# Patient Record
Sex: Female | Born: 1950 | Race: White | Hispanic: No | Marital: Married
Health system: Midwestern US, Community
[De-identification: ages and names within clinical notes are randomized; demographics above are authoritative.]

## PROBLEM LIST (undated history)

## (undated) DIAGNOSIS — M542 Cervicalgia: Secondary | ICD-10-CM

## (undated) DIAGNOSIS — Z87891 Personal history of nicotine dependence: Secondary | ICD-10-CM

## (undated) DIAGNOSIS — R269 Unspecified abnormalities of gait and mobility: Secondary | ICD-10-CM

## (undated) DIAGNOSIS — Z952 Presence of prosthetic heart valve: Secondary | ICD-10-CM

## (undated) DIAGNOSIS — E1121 Type 2 diabetes mellitus with diabetic nephropathy: Secondary | ICD-10-CM

## (undated) DIAGNOSIS — Z1231 Encounter for screening mammogram for malignant neoplasm of breast: Secondary | ICD-10-CM

## (undated) DIAGNOSIS — I2510 Atherosclerotic heart disease of native coronary artery without angina pectoris: Secondary | ICD-10-CM

## (undated) DIAGNOSIS — I152 Hypertension secondary to endocrine disorders: Secondary | ICD-10-CM

## (undated) DIAGNOSIS — S43429A Sprain of unspecified rotator cuff capsule, initial encounter: Secondary | ICD-10-CM

## (undated) DIAGNOSIS — E1159 Type 2 diabetes mellitus with other circulatory complications: Secondary | ICD-10-CM

## (undated) DIAGNOSIS — Z1239 Encounter for other screening for malignant neoplasm of breast: Secondary | ICD-10-CM

## (undated) DIAGNOSIS — G8929 Other chronic pain: Secondary | ICD-10-CM

## (undated) DIAGNOSIS — K219 Gastro-esophageal reflux disease without esophagitis: Secondary | ICD-10-CM

## (undated) DIAGNOSIS — M545 Low back pain: Secondary | ICD-10-CM

## (undated) DIAGNOSIS — Z9861 Coronary angioplasty status: Secondary | ICD-10-CM

## (undated) DIAGNOSIS — Z122 Encounter for screening for malignant neoplasm of respiratory organs: Secondary | ICD-10-CM

## (undated) DIAGNOSIS — G629 Polyneuropathy, unspecified: Secondary | ICD-10-CM

## (undated) DIAGNOSIS — M71122 Other infective bursitis, left elbow: Secondary | ICD-10-CM

## (undated) DIAGNOSIS — M25551 Pain in right hip: Secondary | ICD-10-CM

## (undated) DIAGNOSIS — M549 Dorsalgia, unspecified: Secondary | ICD-10-CM

## (undated) DIAGNOSIS — N6012 Diffuse cystic mastopathy of left breast: Secondary | ICD-10-CM

## (undated) DIAGNOSIS — R059 Cough, unspecified: Secondary | ICD-10-CM

## (undated) DIAGNOSIS — E782 Mixed hyperlipidemia: Secondary | ICD-10-CM

## (undated) DIAGNOSIS — M25552 Pain in left hip: Secondary | ICD-10-CM

## (undated) DIAGNOSIS — M4802 Spinal stenosis, cervical region: Secondary | ICD-10-CM

## (undated) DIAGNOSIS — I7410 Embolism and thrombosis of unspecified parts of aorta: Secondary | ICD-10-CM

## (undated) DIAGNOSIS — K5900 Constipation, unspecified: Secondary | ICD-10-CM

## (undated) DIAGNOSIS — B370 Candidal stomatitis: Secondary | ICD-10-CM

---

## 2009-03-21 LAB — GLUCOSE, POC: Glucose (POC): 102 mg/dL (ref 65–105)

## 2009-04-19 NOTE — Procedures (Signed)
Procedures filed by Mathews Robinsons, MD at 04/19/09 2157                Author: Mathews Robinsons, MD  Service: --  Author Type: Physician       Filed: 04/19/09 2157  Date of Service: 04/19/09 0656  Status: Addendum          Editor: Mathews Robinsons, MD (Physician)            Procedure Orders        1. TRANSCRIBED CARDIAC ANGIOGRAPHY RPT [[id number]] ordered by  at 04/19/09 0656                         <!--EPICS--> Name:       Kelsey [name], Kelsey [name] By:<BR> MR #:       [id number]                      Angio:<BR> Account #:  [id number]                    Technique:<BR> DOB:        [birth year]                     Room:<BR> <BR>                           CARDIAC CATHETERIZATION<BR> <BR> DATE OF PROCEDURE:   03/21/2009<BR> <BR> CINE#:  [id number]<BR> <BR> TECHNIQUE:  Standard approach right Judkins.<BR>  <BR> COMPLICATIONS:  None.<BR> <BR> INDICATIONS:  A 58 year old female with previous history of RCA and left<BR> circumflex stents.<BR> <BR> HEMODYNAMICS:  LV systolic pressure 170.  Aortic pressure 163/80.<BR> <BR> CONTRAST USED:  Visipaque 125 mL.<BR>  <BR> FLUOROSCOPY:  6.5 minutes.  Angio-Seal was used for hemostasis.<BR> <BR> Patient was transferred to the recovery room in stable condition with no<BR> complications.<BR> <BR> LVEDP with the onset of QRS is 25.  There is a gradient of approximately  10<BR> on the aortic valve pullback.<BR> <BR> CORONARY ANATOMY:<BR> LEFT MAIN:  The left main is large in size, long in length.  It tapers<BR> about 10% at its distal portion and trifurcates.<BR> <BR> LEFT CIRCUMFLEX:  The left circumflex is a large nondominant  vessel in its<BR> ostium.  It has about a 20% tubular stenosis.  It has a stent in its<BR> mid-portion that is widely patent.  Just past the stent, there appears to<BR> be an area of eccentric 30% stenosis.  The obtuse marginals that come off<BR> the  circumflex, one comes off right at the stent has good flow though it<BR> may be jailed; the second  is moderate in size and has widely good flow; the<BR> third is small in size.<BR> <BR> INTERMEDIATE ARTERY:  There is a small ramus or intermediate artery  that is<BR> widely patent.<BR> <BR> LEFT ANTERIOR DESCENDING:  The left anterior descending is a large vessel<BR> that gives off one large proximal diagonal at the same point that it gives<BR> off a septal.  The takeoff from the LAD past the diagonal  has a 20%<BR> stenosis.  There is an eccentric 20% plaque in the mid-LAD. The rest of the<BR> LAD is widely patent distally.  The septal appears to be patent and the<BR> large diagonal appears to be patent.<BR> <BR> RIGHT CORONARY ARTERY:  The right coronary  artery was a large vessel  in its<BR> mid-portion has a stent that is widely patent.  Distal to the stent, there<BR> appears to be an area of 10% eccentric stenosis before the bifurcation.<BR> The bifurcation of the PDA and PLB shows no lesions and those  vessels are<BR> also terminally patent.  The PDA proper has mild diffuse disease.<BR> <BR> LEFT VENTRICULOGRAM:  Left ventriculogram reveals an ejection fraction of<BR> 60% with normal wall motion in all segments.<BR> <BR> CONCLUSION:<BR>    1. Widely  patent stents in the RCA and left circumflex.<BR>    2. No significant restenosis or areas of flow-limiting stenosis.  There<BR>    are mild eccentric lesions up to 20-30% seen as described above.<BR>    3. Elevated LVEDP and systemic hypertension is  noted.<BR>    4. Normal left ventricular systolic function.<BR> <BR> <BR> <BR> E-Signed By<BR> Patsy Zaragoza K. Jayshaun Phillips, MD 04/19/2009 21:57<BR> Tulio Facundo K. Brayten Komar, MD<BR> <BR> cc:    Lake Bells, M.D.<BR>        Damya Comley K. Daveah Varone, MD<BR> <BR> <BR> <BR>  <BR> VKS/DCP; D: 04/18/2009  9:41 P; T: 04/19/2009  6:56 A; DOC# [id number]; Job#<BR> [id number]<BJ> <!--EPICE-->

## 2009-04-19 NOTE — Procedures (Addendum)
Name: Kelsey [name], Kelsey [name] By:  MR #: [id number] Angio:  Account #: [id number] Technique:  DOB: November 02, 1951 Room:     CARDIAC CATHETERIZATION    DATE OF PROCEDURE: 03/21/2009    CINE#: [id number]    TECHNIQUE: Standard approach right Judkins.    COMPLICATIONS: None.    INDICATIONS: A 58 year old female with previous history of RCA and left  circumflex stents.    HEMODYNAMICS: LV systolic pressure 170. Aortic pressure 163/80.    CONTRAST USED: Visipaque 125 mL.    FLUOROSCOPY: 6.5 minutes. Angio-Seal was used for hemostasis.    Patient was transferred to the recovery room in stable condition with no  complications.    LVEDP with the onset of QRS is 25. There is a gradient of approximately 10  on the aortic valve pullback.    CORONARY ANATOMY:  LEFT MAIN: The left main is large in size, long in length. It tapers  about 10% at its distal portion and trifurcates.    LEFT CIRCUMFLEX: The left circumflex is a large nondominant vessel in its  ostium. It has about a 20% tubular stenosis. It has a stent in its  mid-portion that is widely patent. Just past the stent, there appears to  be an area of eccentric 30% stenosis. The obtuse marginals that come off  the circumflex, one comes off right at the stent has good flow though it  may be jailed; the second is moderate in size and has widely good flow; the  third is small in size.    INTERMEDIATE ARTERY: There is a small ramus or intermediate artery that is  widely patent.    LEFT ANTERIOR DESCENDING: The left anterior descending is a large vessel  that gives off one large proximal diagonal at the same point that it gives  off a septal. The takeoff from the LAD past the diagonal has a 20%  stenosis. There is an eccentric 20% plaque in the mid-LAD. The rest of the  LAD is widely patent distally. The septal appears to be patent and the  large diagonal appears to be patent.    RIGHT CORONARY ARTERY: The right coronary artery was a large vessel in its   mid-portion has a stent that is widely patent. Distal to the stent, there  appears to be an area of 10% eccentric stenosis before the bifurcation.  The bifurcation of the PDA and PLB shows no lesions and those vessels are  also terminally patent. The PDA proper has mild diffuse disease.    LEFT VENTRICULOGRAM: Left ventriculogram reveals an ejection fraction of  60% with normal wall motion in all segments.    CONCLUSION:   1. Widely patent stents in the RCA and left circumflex.   2. No significant restenosis or areas of flow-limiting stenosis. There   are mild eccentric lesions up to 20-30% seen as described above.   3. Elevated LVEDP and systemic hypertension is noted.   4. Normal left ventricular systolic function.        E-Signed By  Malikye Reppond K. Hodge Stachnik, MD 04/19/2009 21:57  Jolene Guyett K. Gasper Lloyd, MD    cc: Lake Bells, M.D.   Gillis Boardley K. Gasper Lloyd, MD          VKS/DCP; D: 04/18/2009 9:41 P; T: 04/19/2009 6:56 A; DOC# [id number]; Job#  [id number]

## 2009-07-06 LAB — CREATININE
Creatinine: 0.9 MG/DL (ref 0.6–1.3)
GFR est AA: >60 mL/min/{1.73_m2} (ref >60)
GFR est non-AA: >60 mL/min/{1.73_m2} (ref >60)

## 2009-07-06 LAB — HEMOGLOBIN: HGB: 13.6 g/dL (ref 11.5–16.0)

## 2009-07-06 LAB — BUN: BUN: 22 MG/DL — ABNORMAL HIGH (ref 6–20)

## 2009-11-01 MED ORDER — VERAPAMIL ER 180 MG TAB
180 mg | ORAL_TABLET | Freq: Every day | ORAL | Status: DC
Start: 2009-11-01 — End: 2011-03-16

## 2009-12-13 MED ORDER — RANOLAZINE SR 500 MG 12 HR TAB
500 mg | ORAL_TABLET | Freq: Two times a day (BID) | ORAL | Status: DC
Start: 2009-12-13 — End: 2011-03-16

## 2009-12-13 NOTE — Telephone Encounter (Signed)
Per verbal order of Dr. Vipal Sabharwal

## 2010-05-30 MED ORDER — ERGOCALCIFEROL (VITAMIN D2) 50,000 UNIT CAP
1250 mcg (50,000 unit) | ORAL_CAPSULE | ORAL | Status: AC
Start: 2010-05-30 — End: 2010-06-20

## 2010-05-30 MED ORDER — LEVOFLOXACIN 500 MG TAB
500 mg | ORAL_TABLET | Freq: Every day | ORAL | Status: AC
Start: 2010-05-30 — End: 2010-06-09

## 2010-05-30 NOTE — Telephone Encounter (Signed)
I called Diane this am and lmom that if she is running fever or more sob we will call in meds -levaquin 500 daily for 7 days- she should call back with report and I can put order in at that time.

## 2010-05-30 NOTE — Telephone Encounter (Signed)
Spoke with Patient needs abx  And vit D  50,000 script also along with a work note has been off work this week

## 2010-05-30 NOTE — Telephone Encounter (Signed)
Patient called stating was seen last week. She's not doing any better. Would like to speak with Clydie Braun.

## 2010-05-30 NOTE — Telephone Encounter (Addendum)
pls call pt to tell her rx has been eerx'd and she can pick up work note.

## 2010-06-11 LAB — AMB POC URINALYSIS DIP STICK AUTO W/O MICRO
Bilirubin (UA POC): NEGATIVE
Blood (UA POC): NEGATIVE
Glucose (UA POC): NEGATIVE
Ketones (UA POC): NEGATIVE
Nitrites (UA POC): NEGATIVE
Protein (UA POC): NEGATIVE mg/dL
Specific gravity (UA POC): 1.01 (ref 1.001–1.035)
Urobilinogen (UA POC): 0.2
pH (UA POC): 5.5 (ref 4.6–8.0)

## 2010-06-11 MED ORDER — ERGOCALCIFEROL (VITAMIN D2) 50,000 UNIT CAP
1250 mcg (50,000 unit) | ORAL_CAPSULE | ORAL | Status: DC
Start: 2010-06-11 — End: 2012-07-13

## 2010-06-11 NOTE — Patient Instructions (Signed)
Avoid all meat for one week or as long as you can stand.  No beef for 2 weeks.  For lunch try vegetables and fruits.     Vitamin D  Twice a week for 4 weeks.      Continue to cut down on smoking    Referral to Dr. Celine Mans for left thumb

## 2010-06-11 NOTE — Progress Notes (Signed)
Kelsey [name] is a 59 y.o. female and presents with Chief Complaint   Patient presents with   ??? Joint Pain     still not feeling great, ck left thumb     .  Pt feelling some better, but  Today is not a very good day.  Left thumb is hurting and sticking. Back and knees hurt.  She thinks Vit D did help       Current outpatient prescriptions   Medication Sig Dispense Refill   ??? ergocalciferol (ERGOCALCIFEROL) 50,000 unit capsule Take 1 Cap by mouth two (2) days a week.  8 Cap  0   ??? ergocalciferol (ERGOCALCIFEROL) 50,000 unit capsule Take 1 Cap by mouth every Tuesday and Friday for 20 days.  6 Cap  0   ??? sucralfate (CARAFATE) 1 gram tablet Take 1 g by mouth four (4) times daily. Prn reflux       ??? nebivolol (BYSTOLIC) 5 mg tablet Take 10 mg by mouth daily.       ??? tramadol (ULTRAM) 50 mg tablet Take 50 mg by mouth every six (6) hours as needed.       ??? aspirin (ASPIRIN) 325 mg tablet Take 325 mg by mouth daily.       ??? buPROPion SR (BUDEPRION SR) 150 mg SR tablet Take 75 mg by mouth daily.       ??? omega-3 fatty acids-vitamin e (FISH OIL) 1,000 mg Cap Take 1,600 mg by mouth two (2) times a day.       ??? fluoxetine (PROZAC) 20 mg capsule Take  by mouth every morning.       ??? hydrochlorothiazide (MICROZIDE) 12.5 mg capsule Take 12.5 mg by mouth Three (3) times a week.       ??? metformin (GLUCOPHAGE) 500 mg tablet Take 1,000 mg by mouth two (2) times daily (with meals).       ??? methocarbamol (ROBAXIN) 500 mg tablet Take  by mouth as directed.       ??? NITROGLYCERIN (NITROTAB SL) 0.4 mg by SubLINGual route.       ??? clopidogrel (PLAVIX) 75 mg tablet Take  by mouth daily.       ??? ranolazine (RANEXA) 500 mg SR tablet Take 1 Tab by mouth two (2) times a day.  60 Tab  3   ??? verapamil SR (CALAN-SR) 180 mg CR tablet Take 1 Tab by mouth daily.  30 Tab  3       Allergies   Allergen Reactions   ??? Sulfa (Sulfonamide Antibiotics) Unknown (comments)   ??? Pcn (Penicillins) Swelling   ??? Demerol (Meperidine) Unknown (comments)    ??? Avalide (Irbesartan-hydrochlorothiazide) Myalgia   ??? Tricor (Fenofibrate Micronized) Myalgia       Past Medical History   Diagnosis Date   ??? Hypertension    ??? CAD (coronary artery disease)    ??? Chronic pain    ??? Arthritis    ??? COPD    ??? Diabetes        Past Surgical History   Procedure Date   ??? Hx orthopaedic      rt knee   ??? Cardiac surg procedure unlist      PTCA       Family History   Problem Relation Age of Onset   ??? Lung Disease Mother    ??? Stroke Father        History   Substance Use Topics   ??? Smoking status: Current Some Day  Smoker -- 0.3 packs/day for 45 years     Types: Cigarettes   ??? Smokeless tobacco: Never Used    Comment: passed ready   ??? Alcohol Use: No      may be 3 times a year if that            Objective:  BP 170/80   Ht 5\' 5"  (1.651 m)   Wt 225 lb (102.059 kg)   BMI 37.44 kg/m2    In NAD. A&O.  HEENT -- Pupils round.  O/P Clear.  Neck -- Supple. No JVD.mild thyromegaly  Heart -- RRR. No R/M/G.  Lungs -- coarse breeath sounds  Abdomen -- Soft. Non-tender. Non-distended. No masses. Bowel sounds present.  Extremeties -- + 1 edema        Assessment/Plan:  1. Joint pain (719.40A)  RHEUMATOID ARTHRITIS FACTOR, LUPUS (SLE) ANALYSIS, SED RATE (ESR), ergocalciferol (ERGOCALCIFEROL) 50,000 unit capsule, URIC ACID, REFERRAL TO ORTHOPEDIC SURGERY, AMB POC URINALYSIS DIP STICK AUTO W/O MICRO   2. HTN (hypertension) (401.9AF)  AMB POC URINALYSIS DIP STICK AUTO W/O MICRO   3. Urinary tract infection, site not specified (599.0)  AMB POC URINALYSIS DIP STICK AUTO W/O MICRO   4. DM w/o complication type II (250.00)  AMB POC URINALYSIS DIP STICK AUTO W/O MICRO       More than 50% of the time spent in counselling re diet and exercise        Author:  Lake Bells, MD 06/11/2010 4:45 PM

## 2010-06-12 LAB — LUPUS (SLE) ANALYSIS
Actin (Smooth Muscle) Ab: 15 Units (ref 0–19)
Anti-DNA (DS) Ab, QT: <1 IU/mL (ref 0–9)
Antinuclear Antibodies Direct: NEGATIVE
Antiparietal Cell Ab: 3.6 Units (ref 0.0–20.0)
Complement C4: 21 mg/dL Adult (ref 9–36)
Michochondrial (M2) Ab: 3.1 Units (ref 0.0–20.0)
RNP Abs: <0.2 AI (ref 0.0–0.9)
Scleroderma-70 Ab: <0.2 AI (ref 0.0–0.9)
Sjogren's Anti-SS-A: <0.2 AI (ref 0.0–0.9)
Sjogren's Anti-SS-B: <0.2 AI (ref 0.0–0.9)
Smith Abs: <0.2 AI (ref 0.0–0.9)
Striation Abs: NEGATIVE
Thyroid peroxidase Ab: 13 IU/mL (ref 0–34)

## 2010-06-12 LAB — URIC ACID: Uric acid: 7.6 mg/dL — ABNORMAL HIGH (ref 2.5–7.1)

## 2010-06-12 LAB — RHEUMATOID ARTHRITIS FACTOR: Rheumatoid factor: <7 IU/mL (ref 0.0–13.9)

## 2010-06-12 LAB — SED RATE (ESR): Sed rate (ESR): 8 mm/hr (ref 0–56)

## 2010-06-21 NOTE — Telephone Encounter (Signed)
pls call pt and tell her uric acid is sl elevated- meaning  Possible gout- you can google gout diet.  Rheumatoid factor neg, test for inflammation not sig elevated.  How are you feeling on avoidance diet??

## 2010-06-21 NOTE — Telephone Encounter (Signed)
LMOM at home with results

## 2010-10-09 LAB — AMB POC COMPLETE CBC,AUTOMATED ENTER
ABS. LYMPHS (POC): 2.3 10*3/uL (ref 1.2–3.4)
HCT (POC): 40.2 % (ref 35.0–60.0)
HGB (POC): 13.1 g/dL (ref 12.0–16.0)
LYMPHOCYTES (POC): 20 % — AB (ref 20.5–51.1)
MCH (POC): 29.4 pg (ref 27.0–31.0)
MCHC (POC): 32.6 g/dL — AB (ref 33.0–37.0)
MCV (POC): 90.3 fL (ref 80.0–99.9)
PLATELET (POC): 265 10*3/uL (ref 150–450)
RBC (POC): 4.45 M/uL (ref 4.00–6.00)
WBC (POC): 11.3 10*3/uL — AB (ref 4.5–10.5)

## 2010-10-09 MED ORDER — LEVOFLOXACIN 750 MG TAB
750 mg | ORAL_TABLET | Freq: Every day | ORAL | Status: AC
Start: 2010-10-09 — End: 2010-10-14

## 2010-10-09 MED ORDER — IPRATROPIUM-ALBUTEROL 2.5 MG-0.5 MG/3 ML NEB SOLUTION
2.5 mg-0.5 mg/3 ml | PACK | Freq: Once | RESPIRATORY_TRACT | Status: AC
Start: 2010-10-09 — End: 2010-10-09

## 2010-10-09 NOTE — Progress Notes (Signed)
Kelsey [name] is a 59 y.o. female and presents with Chief Complaint   Patient presents with   ??? Cold     Deep Cough and Fever     .started feeling badly Monday with head cold, and dropped into her chest today.She is coughing up green phlegm.  Stopped smoking a few months ago.        Current outpatient prescriptions   Medication Sig Dispense Refill   ??? levofloxacin (LEVAQUIN) 750 mg tablet Take 1 Tab by mouth daily for 5 days.  5 Tab  0   ??? albuterol-ipratropium (DUONEB) 0.5 mg-3 mg(2.5 mg base)/3 mL nebulizer solution 3 mL by Nebulization route once for 1 dose.  1 Package  3   ??? ergocalciferol (ERGOCALCIFEROL) 50,000 unit capsule Take 1 Cap by mouth two (2) days a week.  8 Cap  0   ??? sucralfate (CARAFATE) 1 gram tablet Take 1 g by mouth four (4) times daily. Prn reflux       ??? nebivolol (BYSTOLIC) 5 mg tablet Take 10 mg by mouth daily.       ??? tramadol (ULTRAM) 50 mg tablet Take 50 mg by mouth every six (6) hours as needed.       ??? aspirin (ASPIRIN) 325 mg tablet Take 325 mg by mouth daily.       ??? buPROPion SR (BUDEPRION SR) 150 mg SR tablet Take 75 mg by mouth daily.       ??? omega-3 fatty acids-vitamin e (FISH OIL) 1,000 mg Cap Take 1,600 mg by mouth two (2) times a day.       ??? fluoxetine (PROZAC) 20 mg capsule Take  by mouth every morning.       ??? hydrochlorothiazide (MICROZIDE) 12.5 mg capsule Take 12.5 mg by mouth Three (3) times a week.       ??? metformin (GLUCOPHAGE) 500 mg tablet Take 1,000 mg by mouth two (2) times daily (with meals).       ??? methocarbamol (ROBAXIN) 500 mg tablet Take  by mouth as directed.       ??? NITROGLYCERIN (NITROTAB SL) 0.4 mg by SubLINGual route.       ??? clopidogrel (PLAVIX) 75 mg tablet Take  by mouth daily.       ??? ranolazine (RANEXA) 500 mg SR tablet Take 1 Tab by mouth two (2) times a day.  60 Tab  3   ??? verapamil SR (CALAN-SR) 180 mg CR tablet Take 1 Tab by mouth daily.  30 Tab  3       Allergies   Allergen Reactions   ??? Sulfa (Sulfonamide Antibiotics) Unknown (comments)    ??? Pcn (Penicillins) Swelling   ??? Demerol (Meperidine) Unknown (comments)   ??? Avalide (Irbesartan-hydrochlorothiazide) Myalgia   ??? Tricor (Fenofibrate Micronized) Myalgia       Past Medical History   Diagnosis Date   ??? Hypertension    ??? CAD (coronary artery disease)    ??? Chronic pain    ??? Arthritis    ??? COPD    ??? Diabetes        Past Surgical History   Procedure Date   ??? Hx orthopaedic      rt knee   ??? Cardiac surg procedure unlist      PTCA       Family History   Problem Relation Age of Onset   ??? Lung Disease Mother    ??? Stroke Father        History  Substance Use Topics   ??? Smoking status: Current Some Day Smoker -- 0.3 packs/day for 45 years     Types: Cigarettes   ??? Smokeless tobacco: Never Used    Comment: passed ready   ??? Alcohol Use: No      may be 3 times a year if that            Objective:  BP 140/80   Temp 99.5 ??F (37.5 ??C)   Ht 5\' 5"  (1.651 m)   Wt 225 lb (102.059 kg)   BMI 37.44 kg/m2   SpO2 92%  wdwn 59 yo wf-In NAD. A&O.  HEENT -- Pupils round.  O/P Clear.  Neck -- Supple. No JVD.  Heart --irreg, 2/6 asm  Lungs --very poor air movement  Abdomen -- Soft. Non-tender. Non-distended. No masses. Bowel sounds present.  Extremeties -- No edema.        Assessment/Plan:  1. Cough (786.2)  XR CHEST PA AND LATERAL, AMB POC COMPLETE CBC,AUTOMATED ENTER, levofloxacin (LEVAQUIN) 750 mg tablet, albuterol-ipratropium (DUONEB) 0.5 mg-3 mg(2.5 mg base)/3 mL nebulizer solution     tussionex cough syrup -4 oz.-1      Author:  Lake Bells, MD 10/09/2010 3:47 PM

## 2010-10-09 NOTE — Telephone Encounter (Signed)
Spoke with patient she will come in today

## 2010-10-09 NOTE — Patient Instructions (Signed)
MyChart Activation    Thank you for requesting access to MyChart. Please follow the instructions below to securely access and download your online medical record. MyChart allows you to send messages to your doctor, view your test results, renew your prescriptions, schedule appointments, and more.    How Do I Sign Up?    1. In your internet browser, go to [email or web address].  2. Click on the First Time User? Click Here link in the Sign In box. You will see the New Member Sign Up page.  3. Enter your MyChart Access Code exactly as it appears below. You will not need to use this code after you???ve completed the sign-up process. If you do not sign up before the expiration date, you must request a new code.    MyChart Access Code: [email or web address]  Expires: 01/07/11 03:57 PM (This is the date your MyChart access code will expire)    4. Enter the last four digits of your Social Security Number (xxxx) and Date of Birth (mm/dd/yyyy) as indicated and click Submit. You will be taken to the next sign-up page.  5. Create a MyChart ID. This will be your MyChart login ID and cannot be changed, so think of one that is secure and easy to remember.  6. Create a MyChart password. You can change your password at any time.  7. Enter your Password Reset Question and Answer. This can be used at a later time if you forget your password.   8. Enter your e-mail address. You will receive e-mail notification when new information is available in MyChart.  9. Click Sign Up. You can now view and download portions of your medical record.  10. Click the Download Summary menu link to download a portable copy of your medical information.    Additional Information    If you have questions, please call [phone]. Remember, MyChart is NOT to be used for urgent needs. For medical emergencies, dial 911.

## 2010-10-09 NOTE — Telephone Encounter (Signed)
Kriste Basque is calling for pt but she wants you to call Kelsey [name] to get an appt for today because she is feeling awful. She is coughing and such and she said it feels like she is coughing up a lung. Also that it tastes like blood but nothing is coming up.  # [id number]

## 2010-10-14 NOTE — Telephone Encounter (Signed)
If no fever or chills, continue meds.  You will continue to cough for a couple weeks- if you need to be seen before returning I would be happy to see you tomorrow aft.

## 2010-10-14 NOTE — Telephone Encounter (Signed)
Kelsey [name] would like to speak with dr or nurse re'  I'm  Still sick ; was seen Wednesday; has pneumonia. She's congested, weak. Any recommendations? Ph # is [phone].

## 2010-10-14 NOTE — Telephone Encounter (Signed)
Spoken with patient she advised she is doing better but still little as some weak and is congested.  She is home today and tomorrow, just finish ABX, taking mucinex  and the cough med  Her Question is " does she need to do anything more or different?"

## 2010-10-14 NOTE — Telephone Encounter (Signed)
Called patient and relayed doctor's message to follow-up if not much better or fever returns

## 2010-10-16 NOTE — Telephone Encounter (Signed)
Advised patient she can come in anytime and pick up note

## 2010-10-16 NOTE — Telephone Encounter (Signed)
Pt. Is calling to get a back to work note. Call when ready. It needs to be from 10/09/2010 to 10/16/2010 and to return to work tomorrow the 10/17/2010  # [phone]

## 2010-10-17 LAB — AMB POC COMPLETE CBC,AUTOMATED ENTER
ABS. LYMPHS (POC): 2.5 10*3/uL (ref 1.2–3.4)
HCT (POC): 38.1 % (ref 35.0–60.0)
HGB (POC): 12.5 g/dL (ref 12.0–16.0)
LYMPHOCYTES (POC): 31.1 % (ref 20.5–51.1)
MCH (POC): 29.5 pg (ref 27.0–31.0)
MCHC (POC): 32.8 g/dL — AB (ref 33.0–37.0)
MCV (POC): 89.9 fL (ref 80.0–99.9)
PLATELET (POC): 254 10*3/uL (ref 150–450)
RBC (POC): 4.24 M/uL (ref 4.00–6.00)
WBC (POC): 8.2 10*3/uL (ref 4.5–10.5)

## 2010-10-17 LAB — AMB POC GLUCOSE, QUANTITATIVE, BLOOD: Glucose POC: 180 mg/dL — AB (ref 65–99)

## 2010-10-17 MED ORDER — PREDNISONE 20 MG TAB
20 mg | ORAL_TABLET | ORAL | Status: DC
Start: 2010-10-17 — End: 2010-12-26

## 2010-10-17 MED ORDER — LEVOFLOXACIN 750 MG TAB
750 mg | ORAL_TABLET | Freq: Every day | ORAL | Status: AC
Start: 2010-10-17 — End: 2010-10-27

## 2010-10-17 NOTE — Progress Notes (Signed)
Kelsey [name] is a 59 y.o. female and presents with Chief Complaint   Patient presents with   ??? Follow-up     URI     .cough is better- still brown phlegm.feels very weak, feels very sob, sweats with any activity bs have been higher= not ready to return to work.        Current outpatient prescriptions   Medication Sig Dispense Refill   ??? predniSONE (DELTASONE) 20 mg tablet Take 1 Tab by mouth. One daily for 3 days, then 1/2 daily for 3 days  10 Tab  0   ??? levofloxacin (LEVAQUIN) 750 mg tablet Take 1 Tab by mouth daily for 10 days.  10 Tab  0   ??? ergocalciferol (ERGOCALCIFEROL) 50,000 unit capsule Take 1 Cap by mouth two (2) days a week.  8 Cap  0   ??? sucralfate (CARAFATE) 1 gram tablet Take 1 g by mouth four (4) times daily. Prn reflux       ??? nebivolol (BYSTOLIC) 5 mg tablet Take 10 mg by mouth daily.       ??? tramadol (ULTRAM) 50 mg tablet Take 50 mg by mouth every six (6) hours as needed.       ??? aspirin (ASPIRIN) 325 mg tablet Take 325 mg by mouth daily.       ??? buPROPion SR (BUDEPRION SR) 150 mg SR tablet Take 75 mg by mouth daily.       ??? omega-3 fatty acids-vitamin e (FISH OIL) 1,000 mg Cap Take 1,600 mg by mouth two (2) times a day.       ??? fluoxetine (PROZAC) 20 mg capsule Take  by mouth every morning.       ??? hydrochlorothiazide (MICROZIDE) 12.5 mg capsule Take 12.5 mg by mouth Three (3) times a week.       ??? metformin (GLUCOPHAGE) 500 mg tablet Take 1,000 mg by mouth two (2) times daily (with meals).       ??? methocarbamol (ROBAXIN) 500 mg tablet Take  by mouth as directed.       ??? NITROGLYCERIN (NITROTAB SL) 0.4 mg by SubLINGual route.       ??? clopidogrel (PLAVIX) 75 mg tablet Take  by mouth daily.       ??? ranolazine (RANEXA) 500 mg SR tablet Take 1 Tab by mouth two (2) times a day.  60 Tab  3   ??? verapamil SR (CALAN-SR) 180 mg CR tablet Take 1 Tab by mouth daily.  30 Tab  3       Allergies   Allergen Reactions   ??? Sulfa (Sulfonamide Antibiotics) Unknown (comments)   ??? Pcn (Penicillins) Swelling    ??? Demerol (Meperidine) Unknown (comments)   ??? Avalide (Irbesartan-hydrochlorothiazide) Myalgia   ??? Tricor (Fenofibrate Micronized) Myalgia       Past Medical History   Diagnosis Date   ??? Hypertension    ??? CAD (coronary artery disease)    ??? Chronic pain    ??? Arthritis    ??? COPD    ??? Diabetes        Past Surgical History   Procedure Date   ??? Hx orthopaedic      rt knee   ??? Cardiac surg procedure unlist      PTCA       Family History   Problem Relation Age of Onset   ??? Lung Disease Mother    ??? Stroke Father        History  Substance Use Topics   ??? Smoking status: Current Some Day Smoker -- 0.3 packs/day for 45 years     Types: Cigarettes   ??? Smokeless tobacco: Never Used    Comment: passed ready   ??? Alcohol Use: No      may be 3 times a year if that            Objective:  BP 130/70   Pulse 81   Temp 97.4 ??F (36.3 ??C)   Ht 5\' 5"  (1.651 m)   Wt 221 lb (100.245 kg)   BMI 36.78 kg/m2   SpO2 96%  wdwn58 yo wf  In NAD. A&O.  HEENT -- Pupils round.  O/P Clear.  Neck -- Supple. No JVD.  Heart -- RRR. No R/M/G.  Lungs -- coarse rhonchi with bronchospasm  Abdomen -- Soft. Non-tender. Non-distended. No masses. Bowel sounds present.  Extremeties -- No edema.        Assessment/Plan:  1. Cough (786.2)  AMB POC COMPLETE CBC,AUTOMATED ENTER, predniSONE (DELTASONE) 20 mg tablet, levofloxacin (LEVAQUIN) 750 mg tablet   2. Other chest pain (786.59)     3. Diabetes mellitus (250.00A)  AMB POC GLUCOSE, QUANTITATIVE, BLOOD     Continue jet nebs- take levaquin for 3 more days.      Author:  Lake Bells, MD 10/17/2010 4:00 PM

## 2010-10-31 MED ORDER — TRAMADOL 50 MG TAB
50 mg | ORAL_TABLET | Freq: Four times a day (QID) | ORAL | Status: DC | PRN
Start: 2010-10-31 — End: 2010-12-26

## 2010-12-05 NOTE — Telephone Encounter (Signed)
Message copied by CADDELL-LEE, Marcos Eke on Thu Dec 05, 2010  1:08 PM  ------       Message from: [place], [place] R       Created: Thu Dec 05, 2010  8:58 AM       Contact: [phone]         Pt has a sinus infection wants to speak with nurse

## 2010-12-05 NOTE — Telephone Encounter (Signed)
Called patient and left message awaiting response

## 2010-12-17 MED ORDER — HYDROCHLOROTHIAZIDE 12.5 MG CAP
12.5 mg | ORAL_CAPSULE | ORAL | Status: DC
Start: 2010-12-17 — End: 2011-03-16

## 2010-12-20 MED ORDER — VARENICLINE 1 MG TAB
1 mg | ORAL_TABLET | ORAL | Status: DC
Start: 2010-12-20 — End: 2011-03-16

## 2010-12-26 LAB — AMB POC COMPLETE CBC,AUTOMATED ENTER
ABS. LYMPHS (POC): 2.7 10*3/uL (ref 1.2–3.4)
HCT (POC): 40.2 % (ref 35.0–60.0)
HGB (POC): 13.7 g/dL (ref 12.0–16.0)
LYMPHOCYTES (POC): 34.4 % (ref 20.5–51.1)
MCH (POC): 31.1 pg — AB (ref 27.0–31.0)
MCHC (POC): 34 g/dL (ref 33.0–37.0)
MCV (POC): 91.2 fL (ref 80.0–99.9)
PLATELET (POC): 231 10*3/uL (ref 150–450)
RBC (POC): 4.41 M/uL (ref 4.00–6.00)
WBC (POC): 8 10*3/uL (ref 4.5–10.5)

## 2010-12-26 MED ORDER — HYDROCODONE 10 MG-CHLORPHENIRAMINE 8 MG/5 ML ORAL SUSP EXTEND.REL 12HR
10-8 mg/5 mL | Freq: Two times a day (BID) | ORAL | Status: DC | PRN
Start: 2010-12-26 — End: 2012-01-20

## 2010-12-26 MED ORDER — TRAMADOL 50 MG TAB
50 mg | ORAL_TABLET | Freq: Four times a day (QID) | ORAL | Status: DC | PRN
Start: 2010-12-26 — End: 2012-07-13

## 2010-12-26 NOTE — Progress Notes (Signed)
Kelsey [name] is a 60 y.o. female and presents with Chief Complaint   Patient presents with   ??? URI     also,hip pain     .was sick over Christmas, called Dr. Doreene Eland who called in [facility] - she is better but not well.  Still feels badly. No sob. Or chest pain.  Cough prod of clear to yellow phlegm. Some wheezing.  also having left hip pain.  - has been going on for 6 months.  Better with leaning forward on grocery cart. Wants to try cymbalta since friend had so much luck with it.      Current outpatient prescriptions   Medication Sig Dispense Refill   ??? chlorpheniramine-HYDROcodone (TUSSIONEX) 8-10 mg/5 mL suspension Take 5 mL by mouth every twelve (12) hours as needed for Cough.  120 mL  1   ??? varenicline (CHANTIX CONTINUING MONTH PAK) 1 mg tablet USE AS DIRECTED  56 Tab  4   ??? hydrochlorothiazide (MICROZIDE) 12.5 mg capsule TAKE 1 CAPSULE EVERY DAY  30 Cap  9   ??? tramadol (ULTRAM) 50 mg tablet Take 1 Tab by mouth every six (6) hours as needed.  30 Tab  0   ??? ergocalciferol (ERGOCALCIFEROL) 50,000 unit capsule Take 1 Cap by mouth two (2) days a week.  8 Cap  0   ??? sucralfate (CARAFATE) 1 gram tablet Take 1 g by mouth four (4) times daily. Prn reflux       ??? nebivolol (BYSTOLIC) 5 mg tablet Take 10 mg by mouth daily.       ??? aspirin (ASPIRIN) 325 mg tablet Take 325 mg by mouth daily.       ??? omega-3 fatty acids-vitamin e (FISH OIL) 1,000 mg Cap Take 1,600 mg by mouth two (2) times a day.       ??? fluoxetine (PROZAC) 20 mg capsule Take  by mouth every morning.       ??? metformin (GLUCOPHAGE) 500 mg tablet Take 1,000 mg by mouth two (2) times daily (with meals).       ??? methocarbamol (ROBAXIN) 500 mg tablet Take  by mouth as directed.       ??? NITROGLYCERIN (NITROTAB SL) 0.4 mg by SubLINGual route.       ??? clopidogrel (PLAVIX) 75 mg tablet Take  by mouth daily.       ??? ranolazine (RANEXA) 500 mg SR tablet Take 1 Tab by mouth two (2) times a day.  60 Tab  3    ??? verapamil SR (CALAN-SR) 180 mg CR tablet Take 1 Tab by mouth daily.  30 Tab  3       Allergies   Allergen Reactions   ??? Sulfa (Sulfonamide Antibiotics) Unknown (comments)   ??? Pcn (Penicillins) Swelling   ??? Demerol (Meperidine) Unknown (comments)   ??? Avalide (Irbesartan-hydrochlorothiazide) Myalgia   ??? Tricor (Fenofibrate Micronized) Myalgia       Past Medical History   Diagnosis Date   ??? Hypertension    ??? CAD (coronary artery disease)    ??? Chronic pain    ??? Arthritis    ??? COPD    ??? Diabetes        Past Surgical History   Procedure Date   ??? Hx orthopaedic      rt knee   ??? Cardiac surg procedure unlist      PTCA       Family History   Problem Relation Age of Onset   ??? Lung  Disease Mother    ??? Stroke Father        History   Substance Use Topics   ??? Smoking status: Current Some Day Smoker -- 0.3 packs/day for 45 years     Types: Cigarettes   ??? Smokeless tobacco: Never Used    Comment: passed ready   ??? Alcohol Use: No      may be 3 times a year if that            Objective:  BP 140/90   Ht 5\' 5"  (1.651 m)   Wt 225 lb (102.059 kg)   BMI 37.44 kg/m2   SpO2 94%  wdwn 60 yo wf  In NAD. A&O.  HEENT -- Pupils round.  O/P Clear.tm- fluid  Neck -- Supple. No JVD.  Heart -- RRR. No R/M/G.  Lungs -- coarse breath sounds  Abdomen -- Soft. Non-tender. Non-distended. No masses. Bowel sounds present.  Extremeties -- No edema.     Results for orders placed in visit on 12/26/10   AMB POC COMPLETE CBC,AUTOMATED ENTER   Component Value Range   ??? WBC COUNT 8.0  4.5 - 10.5 (K/uL)   ??? RBC 4.41  4.00 - 6.00 (M/uL)   ??? HGB 13.7  12.0 - 16.0 (g/dL)   ??? HCT 40.2  35.0 - 60.0 (%)   ??? MCV 91.2  80.0 - 99.9 (fL)   ??? MCH 31.1 (*) 27.0 - 31.0 (pg)   ??? MCHC 34.0  33.0 - 37.0 (g/dL)   ??? PLATELET 231  150 - 450 (K/uL)   ??? LYMPHOCYTE % 34.4  20.5 - 51.1 (%)   ??? LYMPHOCYTE # 2.7  1.2 - 3.4 (K/uL)            Assessment/Plan:   1. Cough (786.2)  AMB POC COMPLETE CBC,AUTOMATED ENTER, chlorpheniramine-HYDROcodone (TUSSIONEX) 8-10 mg/5 mL suspension, restart jet nebs   2. DM w/o complication type II (250.00)     3. Back pain (724.5E)  XR SPINE LUMBAR 2 OR 3 VIEWS     Spine xray shows severe djd, she is given refill on tramodol  Stop prozac, start cymbalta 30 mg daily, increase to 30 mg bid and then 60 mg- given rx for cymbalta  Stop smoking, start back exercises  Author:  Lake Bells, MD 12/26/2010 3:56 PM

## 2010-12-26 NOTE — Patient Instructions (Addendum)
MyChart Activation    Thank you for requesting access to MyChart. Please follow the instructions below to securely access and download your online medical record. MyChart allows you to send messages to your doctor, view your test results, renew your prescriptions, schedule appointments, and more.    How Do I Sign Up?    1. In your internet browser, go to [email or web address].  2. Click on the First Time User? Click Here link in the Sign In box. You will see the New Member Sign Up page.  3. Enter your MyChart Access Code exactly as it appears below. You will not need to use this code after you???ve completed the sign-up process. If you do not sign up before the expiration date, you must request a new code.    MyChart Access Code: [email or web address]  Expires: 01/07/11 02:57 PM (This is the date your MyChart access code will expire)    4. Enter the last four digits of your Social Security Number (xxxx) and Date of Birth (mm/dd/yyyy) as indicated and click Submit. You will be taken to the next sign-up page.  5. Create a MyChart ID. This will be your MyChart login ID and cannot be changed, so think of one that is secure and easy to remember.  6. Create a MyChart password. You can change your password at any time.  7. Enter your Password Reset Question and Answer. This can be used at a later time if you forget your password.   8. Enter your e-mail address. You will receive e-mail notification when new information is available in MyChart.  9. Click Sign Up. You can now view and download portions of your medical record.  10. Click the Download Summary menu link to download a portable copy of your medical information.    Additional Information    If you have questions, please call [phone]. Remember, MyChart is NOT to be used for urgent needs. For medical emergencies, dial 911.      Results for orders placed in visit on 12/26/10   AMB POC COMPLETE CBC,AUTOMATED ENTER   Component Value Range    ??? WBC COUNT 8.0  4.5 - 10.5 (K/uL)   ??? RBC 4.41  4.00 - 6.00 (M/uL)   ??? HGB 13.7  12.0 - 16.0 (g/dL)   ??? HCT 40.2  35.0 - 60.0 (%)   ??? MCV 91.2  80.0 - 99.9 (fL)   ??? MCH 31.1 (*) 27.0 - 31.0 (pg)   ??? MCHC 34.0  33.0 - 37.0 (g/dL)   ??? PLATELET 231  150 - 450 (K/uL)   ??? LYMPHOCYTE % 34.4  20.5 - 51.1 (%)   ??? LYMPHOCYTE # 2.7  1.2 - 3.4 (K/uL)     \\start jet nebs 2-3 times daily  Stop prozac, try cymbalta  tussionex fo cough, call if not better

## 2011-01-16 MED ORDER — SUCRALFATE 1 GRAM TAB
1 gram | ORAL_TABLET | ORAL | Status: DC
Start: 2011-01-16 — End: 2011-04-30

## 2011-01-29 MED ORDER — PLAVIX 75 MG TABLET
75 mg | ORAL_TABLET | ORAL | Status: DC
Start: 2011-01-29 — End: 2011-03-16

## 2011-02-14 MED ORDER — FLUOXETINE 20 MG CAP
20 mg | ORAL_CAPSULE | ORAL | Status: DC
Start: 2011-02-14 — End: 2011-03-21

## 2011-02-27 MED ORDER — LIDOCAINE (PF) 20 MG/ML (2 %) IJ SOLN
20 mg/mL (2 %) | Freq: Once | INTRAMUSCULAR | Status: AC
Start: 2011-02-27 — End: 2011-02-27
  Administered 2011-02-27: 16:00:00 via SUBCUTANEOUS

## 2011-02-27 MED ORDER — METHYLPREDNISOLONE 40 MG/ML SUSP FOR INJECTION
40 mg/mL | Freq: Once | INTRAMUSCULAR | Status: AC
Start: 2011-02-27 — End: 2011-02-27
  Administered 2011-02-27: 16:00:00 via INTRAMUSCULAR

## 2011-02-27 MED ORDER — SODIUM CHLORIDE 0.9 % INJECTION
Freq: Once | INTRAMUSCULAR | Status: DC
Start: 2011-02-27 — End: 2011-02-27

## 2011-02-27 MED ORDER — IOHEXOL 180 MG/ML SOLN
180 mg iodine/mL | Freq: Once | INTRATHECAL | Status: AC
Start: 2011-02-27 — End: 2011-02-27
  Administered 2011-02-27: 16:00:00 via INTRATHECAL

## 2011-03-10 MED ORDER — NITROGLYCERIN 0.4 MG SUBLINGUAL TAB
0.4 mg | ORAL_TABLET | SUBLINGUAL | Status: DC
Start: 2011-03-10 — End: 2011-08-16

## 2011-03-13 ENCOUNTER — Inpatient Hospital Stay
Admit: 2011-03-13 | Discharge: 2011-03-16 | Disposition: A | Discharge status: Home / Self Care | Payer: BLUE CROSS/BLUE SHIELD | Source: Ambulatory Visit | Attending: Internal Medicine | Admitting: Internal Medicine

## 2011-03-13 DIAGNOSIS — I2510 Atherosclerotic heart disease of native coronary artery without angina pectoris: Secondary | ICD-10-CM

## 2011-03-13 LAB — CBC W/O DIFF
HCT: 34.4 % — ABNORMAL LOW (ref 35.0–47.0)
HGB: 11.7 g/dL (ref 11.5–16.0)
MCH: 29.3 PG (ref 26.0–34.0)
MCHC: 34 g/dL (ref 30.0–36.5)
MCV: 86 FL (ref 80.0–99.0)
PLATELET: 229 10*3/uL (ref 150–400)
RBC: 4 M/uL (ref 3.80–5.20)
RDW: 14 % (ref 11.5–14.5)
WBC: 7.9 10*3/uL (ref 3.6–11.0)

## 2011-03-13 LAB — GLUCOSE, POC: Glucose (POC): 138 mg/dL — ABNORMAL HIGH (ref 65–105)

## 2011-03-13 LAB — METABOLIC PANEL, COMPREHENSIVE
A-G Ratio: 1.2 (ref 1.1–2.2)
ALT (SGPT): 50 U/L (ref 12–78)
AST (SGOT): 31 U/L (ref 15–37)
Albumin: 3.4 g/dL — ABNORMAL LOW (ref 3.5–5.0)
Alk. phosphatase: 53 U/L (ref 50–136)
Anion gap: 9 mmol/L (ref 5–15)
BUN/Creatinine ratio: 23 — ABNORMAL HIGH (ref 12–20)
BUN: 18 MG/DL (ref 6–20)
Bilirubin, total: 0.3 MG/DL (ref 0.2–1.0)
CO2: 27 MMOL/L (ref 21–32)
Calcium: 8.5 MG/DL (ref 8.5–10.1)
Chloride: 98 MMOL/L (ref 97–108)
Creatinine: 0.8 MG/DL (ref 0.6–1.3)
GFR est AA: >60 mL/min/{1.73_m2} (ref >60)
GFR est non-AA: >60 mL/min/{1.73_m2} (ref >60)
Globulin: 2.9 g/dL (ref 2.0–4.0)
Glucose: 137 MG/DL — ABNORMAL HIGH (ref 65–100)
Potassium: 3.7 MMOL/L (ref 3.5–5.1)
Protein, total: 6.3 g/dL — ABNORMAL LOW (ref 6.4–8.2)
Sodium: 134 MMOL/L — ABNORMAL LOW (ref 136–145)

## 2011-03-13 LAB — AMB POC COMPLETE CBC,AUTOMATED ENTER
ABS. LYMPHS (POC): 2.7 10*3/uL (ref 1.2–3.4)
HCT (POC): 39.5 % (ref 35.0–60.0)
HGB (POC): 13.1 g/dL (ref 12.0–16.0)
LYMPHOCYTES (POC): 31.3 % (ref 20.5–51.1)
MCH (POC): 30.3 pg (ref 27.0–31.0)
MCHC (POC): 33.2 g/dL (ref 33.0–37.0)
MCV (POC): 91.1 fL (ref 80.0–99.9)
PLATELET (POC): 250 10*3/uL (ref 150–450)
RBC (POC): 4.33 M/uL (ref 4.00–6.00)
WBC (POC): 8.5 10*3/uL (ref 4.5–10.5)

## 2011-03-13 LAB — TROPONIN I: Troponin-I, Qt.: <0.04 ng/mL (ref <0.05)

## 2011-03-13 MED ORDER — SODIUM CHLORIDE 0.9% BOLUS IV
0.9 % | Freq: Once | INTRAVENOUS | Status: AC
Start: 2011-03-13 — End: 2011-03-13
  Administered 2011-03-13: 23:00:00 via INTRAVENOUS

## 2011-03-13 MED ORDER — IOVERSOL 350 MG/ML IV SOLN
350 mg iodine/mL | Freq: Once | INTRAVENOUS | Status: AC
Start: 2011-03-13 — End: 2011-03-13
  Administered 2011-03-13: 23:00:00 via INTRAVENOUS

## 2011-03-13 MED ORDER — SODIUM CHLORIDE 0.9 % IJ SYRG
Freq: Once | INTRAMUSCULAR | Status: AC
Start: 2011-03-13 — End: 2011-03-13
  Administered 2011-03-13: 23:00:00 via INTRAVENOUS

## 2011-03-13 MED ADMIN — enoxaparin (LOVENOX) injection 40 mg: SUBCUTANEOUS | @ 17:00:00 | NDC 00075062040

## 2011-03-13 MED ADMIN — docusate sodium (COLACE) capsule 100 mg: ORAL | @ 21:00:00 | NDC 62584068311

## 2011-03-13 MED ADMIN — metFORMIN ER (GLUCOPHAGE XR) tablet 1,000 mg: ORAL | @ 21:00:00 | NDC 50268053115

## 2011-03-13 NOTE — Consults (Signed)
Name:       Kelsey [name], Kelsey [name]            Admitted:          03/13/2011                                         DOB:               22-May-1951  Account #:  [id number]               Age:               60  Consultant: Lutricia Horsfall, MD            Location           [phone]                                CONSULTATION REPORT    DATE OF CONSULTATION      HISTORY OF PRESENT ILLNESS: The patient is a 60 year old woman with the  following medical problems:  1. Coronary artery disease.  2. Presentation with acute onset dyspnea.  3. Hypertension.  4. Diabetes mellitus.  5. Continued tobacco abuse.  6. Hyperlipidemia.    Thank you very much for the opportunity to see this patient.    The patient was seen in Dr. Nelta Numbers office after she had one full day of  dyspnea on exertion. Yesterday, she noted that when she walked from the  parking lot to her office, she was dyspneic on exertion without any chest  pain or chest discomfort. Typically, with her known history of coronary  artery disease, she gets jaw pain. This was absent with this, although she  does have associated shortness of breath. She felt very weak the rest of  the day and then went to bed. While getting up this morning, taking a  shower and getting ready for work, she was very out of breath, felt very  weak, but without any chest pain or chest congestion noted. No  palpitations, although she felt her chest was beating heavier and harder.  She has no presyncope or syncope. No history of atrial arrhythmias.    She does continue to smoke and has a history of underlying coronary disease  with her last cardiac catheterization in May of 2010, demonstrating LVEDP  of 25. The circumflex is at 20% tubular stenosis with a stent, which was  widely patent and had 30% eccentric stenosis beyond the stent. The LAD had  evidence of 20% eccentric lesion in the mid LAD and in the right coronary  artery, was a large vessel that had a patent stent. She had normal LV   systolic function and at that time, had no significant flow or ________  disease.    ALLERGIES  Include:  1. AVALIDE.  2. HYDROCHLOROTHIAZIDE.  3. DEMEROL.  4. PENICILLIN.  5. SULFA.  6. TRICOR.    CURRENT MEDICATIONS  Include:  1. Plavix 75 mg a day.  2. Nitroglycerin.  3. Tramadol suspension.  4. Chantix.  5. Hydrochlorothiazide 12.5 daily.  6. Nebivolol (Bystolic) 10 mg daily.  7. Omega 3 fatty acid.  8. Metformin 1 gram b.i.d.  9. Ranexa 500 mg once daily.  10. Verapamil 180 mg once daily.    FAMILY HISTORY: Notable for  lung disease in her mother and stroke in her  father.    SOCIAL HISTORY: The patient works at the [employer]. 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    REVIEW OF SYSTEMS: Notable for no hemoptysis, hematemesis, fevers, chills,  cough, sputum production, unilateral weakness, palpitations, chest pain,  nausea, vomiting, diarrhea, hematemesis or epistaxis, hematochezia or  vaginal bleeding.    PHYSICAL EXAMINATION  GENERAL: The patient appears in good health.  VITAL SIGNS: Blood pressure is 110/56, pulse 74 and regular. She is  afebrile.  SKIN: Warm and dry.  HEENT: Sclerae anicteric. Conjunctivae are pink. Oropharynx without  exudates. Mallampati 3.  NECK: Thyroid is not enlarged.  CARDIAC: PMI is nondisplaced. Normal S1 and S2 with a 3/6 systolic murmur.  S2 is very diminished, especially in the aortic component.  ABDOMEN: Soft, nontender, nondistended. No hepatosplenomegaly. NEUROLOGIC:  Normal sensation. Pulses are 2+ throughout.  EXTREMITIES: No edema.    Electrocardiogram demonstrates sinus rhythm with nonspecific  intraventricular conduction delay.    LABORATORY DATA: Pertinent laboratory includes a normal CBC, hematocrit,  hemoglobin and BMP.    IMPRESSION  1. Acute onset of dyspnea. The patient, on exam, does have evidence of a  murmur, which may be aortic stenosis. At this time, I would recommend  further evaluation with a 2D echocardiogram. In addition, another   possibility would be pulmonary emboli and we will get a CT angio because of  that.  2. History of diabetes on Metformin. She will need to hold the Metformin  for 48 hours.    If these 2 tests are unrevealing, I would suggest a cardiac  catheterization. We will defer this at the present time, but if there is no  other etiology identified, I think that this will be the next appropriate  test, despite the fact that she did not have true chest pain, but just  dyspnea. She is advised to discontinue smoking.              Lutricia Horsfall, MD    cc:   Lutricia Horsfall, MD        Lake Bells, M.D.      JO/wmx; D: 03/13/2011  6:07 P; T: 03/13/2011  6:35 P; DOC# [id number]; Job#  [id number]

## 2011-03-13 NOTE — H&P (Signed)
History and Physical    Subjective:     Kelsey [name] is a 60 y.o. Caucasian female who presents with sob/  She noticed is yesterday am,  Has had no cough, fever,chills or chest pain.. This am while getting ready for work . Pox in office 95 % ra, peak flow 280. Chest xray was fine, ekg showed poss lat ischemia.She is admitted to r/o MI    Past Medical History   Diagnosis Date   ??? Hypertension    ??? CAD (coronary artery disease)    ??? Chronic pain    ??? Arthritis    ??? COPD    ??? Diabetes      Allergies   Allergen Reactions   ??? Avalide (Irbesartan-Hydrochlorothiazide) Myalgia   ??? Demerol (Meperidine) Unknown (comments)   ??? Pcn (Penicillins) Swelling   ??? Sulfa(Sulfonamide Antibiotics) Unknown (comments)   ??? Tricor (Fenofibrate Micronized) Myalgia     Prior to Admission medications    Medication Sig Start Date End Date Taking? Authorizing Provider   nitroglycerin (NITROSTAT) 0.4 mg SL tablet TAKE 1 TABLET BY MOUTH UNDER THE TONGUE EVERY 5 MINUTES AS NEEDED FOR CHEST PAIN AS DIRECTED 03/08/11   Lake Bells, MD   FLUoxetine (PROZAC) 20 mg capsule TAKE 1 CAPSULE EVERY DAY 02/14/11   Lake Bells, MD   PLAVIX 75 mg tablet TAKE 1 TABLET BY MOUTH EVERY DAY 01/29/11   Lake Bells, MD   sucralfate (CARAFATE) 1 gram tablet TAKE 1 TABLET 4 TIMES A DAY BEFORE MEALS AND AT BEDTIME 01/16/11   Lake Bells, MD   chlorpheniramine-HYDROcodone (TUSSIONEX) 8-10 mg/5 mL suspension Take 5 mL by mouth every twelve (12) hours as needed for Cough. 12/26/10   Lake Bells, MD   traMADol (ULTRAM) 50 mg tablet Take 1 Tab by mouth every six (6) hours as needed. 12/26/10   Lake Bells, MD   varenicline [facility] CONTINUING MONTH PAK) 1 mg tablet USE AS DIRECTED 12/19/10   Lake Bells, MD   hydrochlorothiazide (MICROZIDE) 12.5 mg capsule TAKE 1 CAPSULE EVERY DAY 12/16/10   Lake Bells, MD   ergocalciferol (ERGOCALCIFEROL) 50,000 unit capsule Take 1 Cap by mouth two (2) days a week. 06/13/10   Lake Bells, MD    nebivolol (BYSTOLIC) 5 mg tablet Take 10 mg by mouth daily.    Historical Provider   aspirin (ASPIRIN) 325 mg tablet Take 325 mg by mouth daily.    Historical Provider   omega-3 fatty acids-vitamin e (FISH OIL) 1,000 mg Cap Take 1,600 mg by mouth two (2) times a day. 12/24/09   Historical Provider   metformin (GLUCOPHAGE) 500 mg tablet Take 1,000 mg by mouth two (2) times daily (with meals).    Historical Provider   methocarbamol (ROBAXIN) 500 mg tablet Take  by mouth as directed. 12/24/09   Historical Provider   NITROGLYCERIN (NITROTAB SL) 0.4 mg by SubLINGual route.    Historical Provider   ranolazine (RANEXA) 500 mg SR tablet Take 1 Tab by mouth two (2) times a day. 12/13/09   Vipal Sabharwal, MD   verapamil SR (CALAN-SR) 180 mg CR tablet Take 1 Tab by mouth daily. 10/29/09   Mathews Robinsons, MD     History   Substance Use Topics   ??? Smoking status: Current Some Day Smoker -- 0.3 packs/day for 45 years     Types: Cigarettes   ??? Smokeless tobacco: Never Used    Comment: passed ready   ???  Alcohol Use: No      may be 3 times a year if that     Family History   Problem Relation Age of Onset   ??? Lung Disease Mother    ??? Stroke Father                Review of Systems:  As above.    Objective:       Physical Exam:   BP 110/56   Pulse 74   Temp 97.8 ??F (36.6 ??C)   Resp 18   Ht 5\' 5"  (1.651 m)   Wt 218 lb (98.884 kg)   BMI 36.28 kg/m2   SpO2 94%  General appearance: alert, cooperative, no distress, appears stated age  Throat: Lips, mucosa, and tongue normal. Teeth and gums normal  Neck: supple, symmetrical, trachea midline, no adenopathy, thyroid: not enlarged, symmetric, no tenderness/mass/nodules, no carotid bruit and no JVD  Lungs: coarse breath sounds  Breasts: normal appearance, no masses or tenderness  Heart: regular rate and rhythm, S1, S2 normal, no murmur, click, rub or gallop  Abdomen: soft, non-tender. Bowel sounds normal. No masses,  no organomegaly   Extremities: extremities normal, atraumatic, no cyanosis  edema +1  Pulses +1 symm      Data Review:   Recent Results (from the past 24 hour(s))   AMB POC COMPLETE CBC,AUTOMATED ENTER       Component Value Range    WBC COUNT 8.5  4.5 - 10.5 (K/uL)    RBC 4.33  4.00 - 6.00 (M/uL)    HGB 13.1  12.0 - 16.0 (g/dL)    HCT 45.4  09.8 - 11.9 (%)    MCV 91.1  80.0 - 99.9 (fL)    MCH 30.3  27.0 - 31.0 (pg)    MCHC 33.2  33.0 - 37.0 (g/dL)    PLATELET 147  829 - 450 (K/uL)    LYMPHOCYTE % 31.3  20.5 - 51.1 (%)    LYMPHOCYTE # 2.7  1.2 - 3.4 (K/uL)       Chest x-ray- no acute change    EKG- lat ischemia  Assessment:     Active Problems:   Dyspnea (03/13/2011)      Plan:     R/o MI, cards consult  Oxygen, duonebs  Gi and dvt prophylaxis    Signed By: Lake Bells, MD     March 13, 2011

## 2011-03-13 NOTE — Progress Notes (Signed)
Kelsey [name] is a 60 y.o. female and presents with   Chief Complaint   Patient presents with   ??? Shortness of Breath     Walk -in hard time getting a deep breath   .started yest am with sob/doe. No cough or fever.  No nausea or vomiting, no chest pain,  No swelling in feet. No jaw pain.  Legs feel rubbery when she walks across the room. Broke out in sweat this am. Had steroid shot in back 2 weeks ago        Current Outpatient Prescriptions   Medication Sig Dispense Refill   ??? nitroglycerin (NITROSTAT) 0.4 mg SL tablet TAKE 1 TABLET BY MOUTH UNDER THE TONGUE EVERY 5 MINUTES AS NEEDED FOR CHEST PAIN AS DIRECTED  50 Tab  1   ??? FLUoxetine (PROZAC) 20 mg capsule TAKE 1 CAPSULE EVERY DAY  90 Cap  1   ??? PLAVIX 75 mg tablet TAKE 1 TABLET BY MOUTH EVERY DAY  30 Tab  2   ??? sucralfate (CARAFATE) 1 gram tablet TAKE 1 TABLET 4 TIMES A DAY BEFORE MEALS AND AT BEDTIME  240 Tab  2   ??? chlorpheniramine-HYDROcodone (TUSSIONEX) 8-10 mg/5 mL suspension Take 5 mL by mouth every twelve (12) hours as needed for Cough.  120 mL  1   ??? traMADol (ULTRAM) 50 mg tablet Take 1 Tab by mouth every six (6) hours as needed.  30 Tab  1   ??? varenicline (CHANTIX CONTINUING MONTH PAK) 1 mg tablet USE AS DIRECTED  56 Tab  4   ??? hydrochlorothiazide (MICROZIDE) 12.5 mg capsule TAKE 1 CAPSULE EVERY DAY  30 Cap  9   ??? ergocalciferol (ERGOCALCIFEROL) 50,000 unit capsule Take 1 Cap by mouth two (2) days a week.  8 Cap  0   ??? nebivolol (BYSTOLIC) 5 mg tablet Take 10 mg by mouth daily.       ??? aspirin (ASPIRIN) 325 mg tablet Take 325 mg by mouth daily.       ??? omega-3 fatty acids-vitamin e (FISH OIL) 1,000 mg Cap Take 1,600 mg by mouth two (2) times a day.       ??? metformin (GLUCOPHAGE) 500 mg tablet Take 1,000 mg by mouth two (2) times daily (with meals).       ??? methocarbamol (ROBAXIN) 500 mg tablet Take  by mouth as directed.       ??? NITROGLYCERIN (NITROTAB SL) 0.4 mg by SubLINGual route.        ??? ranolazine (RANEXA) 500 mg SR tablet Take 1 Tab by mouth two (2) times a day.  60 Tab  3   ??? verapamil SR (CALAN-SR) 180 mg CR tablet Take 1 Tab by mouth daily.  30 Tab  3     Allergies   Allergen Reactions   ??? Avalide (Irbesartan-Hydrochlorothiazide) Myalgia   ??? Demerol (Meperidine) Unknown (comments)   ??? Pcn (Penicillins) Swelling   ??? Sulfa(Sulfonamide Antibiotics) Unknown (comments)   ??? Tricor (Fenofibrate Micronized) Myalgia     Past Medical History   Diagnosis Date   ??? Hypertension    ??? CAD (coronary artery disease)    ??? Chronic pain    ??? Arthritis    ??? COPD    ??? Diabetes      Past Surgical History   Procedure Date   ??? Hx orthopaedic      rt knee   ??? Cardiac surg procedure unlist      PTCA  Family History   Problem Relation Age of Onset   ??? Lung Disease Mother    ??? Stroke Father      History   Substance Use Topics   ??? Smoking status: Current Some Day Smoker -- 0.3 packs/day for 45 years     Types: Cigarettes   ??? Smokeless tobacco: Never Used    Comment: passed ready   ??? Alcohol Use: No      may be 3 times a year if that          Objective:  BP 130/70   Ht 5\' 5"  (1.651 m)   Wt 225 lb (102.059 kg)   BMI 37.44 kg/m2   SpO2 95%   PF 280 L/min  Heavy 60 yo wf-  In NAD. A&O.  HEENT -- Pupils round.  O/P Clear.  Neck -- Supple. No JVD.  Heart -- RRR. No R/M/G.  Lungs -- Coarse breath sounds  Abdomen -- Soft. Non-tender. Non-distended. No masses. Bowel sounds present.  Extremities -- No edema.        Assessment/Plan:  1. SOB (shortness of breath)  XR CHEST PA AND LATERAL, AMB POC COMPLETE CBC,AUTOMATED ENTER, AMB POC EKG ROUTINE W/ 12 LEADS, INTER & REP     Go to hospital- admit for r/o MI      Author:  Lake Bells, MD 03/13/2011 9:12 AM

## 2011-03-13 NOTE — Consults (Signed)
CARDIOLOGY CONSULT                 Assessment:     Assessment:     Cardiac risk factors: diabetes mellitus, hypertension.  Cath 5/10:  1. Widely patent stents in the RCA and left circumflex.  2. No significant restenosis or areas of flow-limiting stenosis. There  are mild eccentric lesions up to 20-30% seen as described above.  3. Elevated LVEDP and systemic hypertension is noted.  4. Normal left ventricular systolic function.        Rec: cta and echo.  May need cath if these results are negative.

## 2011-03-13 NOTE — Patient Instructions (Signed)
Go to hospital

## 2011-03-13 NOTE — Progress Notes (Signed)
Bedside and Verbal shift change report given to Erin (oncoming nurse) by Bilbo Carcamo (offgoing nurse).  Report given with SBAR, Kardex, Procedure Summary, Intake/Output, MAR and Recent Results.

## 2011-03-14 LAB — GLUCOSE, POC: Glucose (POC): 142 mg/dL — ABNORMAL HIGH (ref 65–105)

## 2011-03-14 LAB — TROPONIN I
Troponin-I, Qt.: <0.04 ng/mL (ref <0.05)
Troponin-I, Qt.: <0.04 ng/mL (ref <0.05)

## 2011-03-14 LAB — EKG, 12 LEAD, INITIAL
Atrial Rate: 75 {beats}/min
Calculated P Axis: 77 degrees
Calculated R Axis: 8 degrees
Calculated T Axis: -81 degrees
Diagnosis: NORMAL
P-R Interval: 178 ms
Q-T Interval: 410 ms
QRS Duration: 118 ms
QTC Calculation (Bezet): 457 ms
Ventricular Rate: 75 {beats}/min

## 2011-03-14 MED ORDER — IOVERSOL 350 MG/ML IV SOLN
350 mg iodine/mL | Freq: Once | INTRAVENOUS | Status: DC | PRN
Start: 2011-03-14 — End: 2011-03-14
  Administered 2011-03-14: 21:00:00 via INTRA_ARTERIAL

## 2011-03-14 MED ORDER — MIDAZOLAM 1 MG/ML IJ SOLN
1 mg/mL | INTRAMUSCULAR | Status: DC | PRN
Start: 2011-03-14 — End: 2011-03-14
  Administered 2011-03-14 (×2): via INTRAVENOUS

## 2011-03-14 MED ORDER — NITROGLYCERIN 0.1 MG/ML (100 MCG/ML) D5W COMPOUNDED INJECTION
0.1 mg/mL | INTRAVENOUS | Status: DC | PRN
Start: 2011-03-14 — End: 2011-03-14
  Administered 2011-03-14: 22:00:00 via INTRACORONARY

## 2011-03-14 MED ORDER — ATROPINE 0.1 MG/ML SYRINGE
0.1 mg/mL | INTRAMUSCULAR | Status: DC | PRN
Start: 2011-03-14 — End: 2011-03-14

## 2011-03-14 MED ORDER — SALINE PERIPHERAL FLUSH PRN
INTRAMUSCULAR | Status: DC | PRN
Start: 2011-03-14 — End: 2011-03-14
  Administered 2011-03-14: 22:00:00

## 2011-03-14 MED ORDER — LIDOCAINE (PF) 20 MG/ML (2 %) IV SYRINGE
100 mg/5 mL (2 %) | Freq: Once | INTRAVENOUS | Status: DC | PRN
Start: 2011-03-14 — End: 2011-03-14

## 2011-03-14 MED ORDER — HEPARIN (PORCINE) 1,000 UNIT/ML IJ SOLN
1000 unit/mL | Freq: Once | INTRAMUSCULAR | Status: DC | PRN
Start: 2011-03-14 — End: 2011-03-14

## 2011-03-14 MED ORDER — SODIUM CHLORIDE 0.9 % IV PIGGY BACK
250 mg | INTRAVENOUS | Status: DC
Start: 2011-03-14 — End: 2011-03-14
  Administered 2011-03-14: 22:00:00 via INTRAVENOUS

## 2011-03-14 MED ORDER — FENTANYL CITRATE (PF) 50 MCG/ML IJ SOLN
50 mcg/mL | INTRAMUSCULAR | Status: DC | PRN
Start: 2011-03-14 — End: 2011-03-14
  Administered 2011-03-14 (×2): via INTRAVENOUS

## 2011-03-14 MED ORDER — CLOPIDOGREL 300 MG TAB
300 mg | Freq: Once | ORAL | Status: DC | PRN
Start: 2011-03-14 — End: 2011-03-14

## 2011-03-14 MED ORDER — HEPARIN (PORCINE) IN NS (PF) 1,000 UNIT/500 ML IV
1000 unit/500 mL | INTRAVENOUS | Status: DC | PRN
Start: 2011-03-14 — End: 2011-03-14

## 2011-03-14 MED ORDER — LIDOCAINE HCL 1 % (10 MG/ML) IJ SOLN
10 mg/mL (1 %) | Freq: Once | INTRAMUSCULAR | Status: DC | PRN
Start: 2011-03-14 — End: 2011-03-14
  Administered 2011-03-14: 21:00:00 via SUBCUTANEOUS

## 2011-03-14 MED ORDER — EPTIFIBATIDE 0.75 MG/ML IV SOLN
0.75 mg/mL | INTRAVENOUS | Status: DC
Start: 2011-03-14 — End: 2011-03-14

## 2011-03-14 MED ORDER — EPINEPHRINE 0.1 MG/ML SYRINGE
0.1 mg/mL | Freq: Once | INTRAMUSCULAR | Status: DC | PRN
Start: 2011-03-14 — End: 2011-03-14

## 2011-03-14 MED ORDER — DIATRIZOATE MEGLUMINE & SODIUM 66-10 % IJ SOLN
66-10 % | Freq: Once | INTRAMUSCULAR | Status: DC | PRN
Start: 2011-03-14 — End: 2011-03-14

## 2011-03-14 MED ORDER — PRASUGREL 10 MG TAB
10 mg | Freq: Once | ORAL | Status: DC | PRN
Start: 2011-03-14 — End: 2011-03-14

## 2011-03-14 MED ORDER — PRASUGREL 10 MG TAB
10 mg | Freq: Once | ORAL | Status: DC | PRN
Start: 2011-03-14 — End: 2011-03-14
  Administered 2011-03-14: 22:00:00 via ORAL

## 2011-03-14 MED ORDER — FUROSEMIDE 10 MG/ML IJ SOLN
10 mg/mL | Freq: Once | INTRAMUSCULAR | Status: AC
Start: 2011-03-14 — End: 2011-03-14
  Administered 2011-03-14: 23:00:00 via INTRAVENOUS

## 2011-03-14 MED ADMIN — aspirin (ASPIRIN) tablet 325 mg: ORAL | @ 14:00:00 | NDC 00904200960

## 2011-03-14 MED ADMIN — influenza vaccine tr-s 11 (PF) (FLUVIRIN, FLUZONE, AFLURIA) injection 0.5 mL: INTRAMUSCULAR | @ 18:00:00 | NDC 49281001150

## 2011-03-14 MED ADMIN — DULoxetine (CYMBALTA) capsule 60 mg: ORAL | @ 14:00:00 | NDC 00002324001

## 2011-03-14 MED ADMIN — 0.9% sodium chloride infusion 500 mL: INTRAVENOUS | @ 21:00:00 | NDC 00409798303

## 2011-03-14 MED ADMIN — 0.9% sodium chloride infusion: INTRAVENOUS | @ 23:00:00 | NDC 00409798309

## 2011-03-14 MED ADMIN — docusate sodium (COLACE) capsule 100 mg: ORAL | @ 22:00:00 | NDC 57664011208

## 2011-03-14 MED ADMIN — Benzocaine-Menthol-Cetylpyrid (CEPACOL) lozenge 1 Lozenge: @ 14:00:00 | NDC 70312046129

## 2011-03-14 MED ADMIN — docusate sodium (COLACE) capsule 100 mg: ORAL | @ 14:00:00 | NDC 62584068311

## 2011-03-14 MED ADMIN — FLUoxetine (PROZAC) capsule 20 mg: ORAL | @ 13:00:00 | NDC 82009010110

## 2011-03-14 MED ADMIN — acetaminophen (TYLENOL) tablet 650 mg: ORAL | NDC 50580050130

## 2011-03-14 MED ADMIN — clopidogrel (PLAVIX) tablet 75 mg: ORAL | @ 14:00:00 | NDC 63653117103

## 2011-03-14 MED ADMIN — nebivolol (BYSTOLIC) tablet 10 mg: ORAL | @ 14:00:00 | NDC 00456140530

## 2011-03-14 MED ADMIN — nitroglycerin (NITROBID) 2 % ointment 1 Inch: TOPICAL | NDC 00281032608

## 2011-03-14 MED ADMIN — hydrochlorothiazide (HYDRODIURIL) tablet 12.5 mg: ORAL | @ 14:00:00 | NDC 68084008611

## 2011-03-14 MED ADMIN — acetaminophen (TYLENOL) tablet 650 mg: ORAL | @ 03:00:00 | NDC 50580050130

## 2011-03-14 NOTE — Procedures (Signed)
Cardiac Catheterization Procedure Note   Patient: Kelsey [name]  MRN: [id number]  SSN: [id number]   Date of Birth: 1951-08-15 Age: 60 y.o.  Sex: female    Date of Procedure: 03/14/2011   Pre-procedure Diagnosis: Shortness of Breath  Post-procedure Diagnosis: Coronary Artery Disease  Procedure: Left Heart Catheterization with Percutaneous Coronary Intervention  Operator:  Dr. Jerrol Banana, MD    Assistant(s):  None  Anesthesia: Moderate Sedation   Estimated Blood Loss: Less than 10 mL   Specimens Removed: None  Findings: Progressive CAD with total and chronic appearing occlusion of mid-RCA at the level of the stent and 90% stenosis after the stent in the mid-Cx.  LVEF 30% and maximum AV gradient is 20 mmHg.  No residual post stents x 2 to the Cx.  RCA not amenable to standard catheter intervention.  Well tolerated.  Complications: None   Implants:  None  Signed by:  Jerrol Banana, MD  03/14/2011  6:09 PM

## 2011-03-14 NOTE — Progress Notes (Signed)
2D, M-Mode, and Color Flow Doppler Completed.

## 2011-03-14 NOTE — Progress Notes (Signed)
Cardiac Cath Lab Procedure Area Arrival Note:    Kelsey [name] arrived to Cardiac Cath Lab, Procedure Area. Patient identifiers verified with NAME and DATE OF BIRTH. Procedure verified with patient. Consent forms verified. Allergies verified. Patient informed of procedure and plan of care. Questions answered with review. Patient voiced understanding of procedure and plan of care.    Patient on cardiac monitor, non-invasive blood pressure, SPO2 monitor. On O2 @ 2 lpm via NC.  IV of 0.9%NS on pump at 100 ml/hr. Patient status doing well without problems. Patient is A&Ox 3. Patient reports no verbalized pain.    Patient medicated during procedure with orders obtained and verified by Dr. Oscar La.    Refer to patients Cardiac Cath Lab PROCEDURE REPORT for vital signs, assessment, status, and response during procedure, printed at end of case. Printed report on chart or scanned into chart.

## 2011-03-14 NOTE — Progress Notes (Signed)
Cardiac Cath Lab Recovery Arrival Note:      Kelsey [name] arrived to Cardiac Cath Lab, Recovery Area. Staff introduced to patient. Patient identifiers verified with NAME and DATE OF BIRTH. Procedure verified with patient. Consent forms reviewed and signed by patient or authorized representative and verified. Allergies verified. Patient informed of procedure and plan of care. Questions answered with review. Patient prepped for procedure, per orders from physician, prior to arrival.    Patient on cardiac monitor, non-invasive blood pressure, SPO2 monitor. Patient is A&Ox 4. Patient reports no pain, no chest pain, nno n/v.     Patient in stretcher, in low position, with side rails up, call bell within reach, patient instructed to call of assistance as needed.    Patient prep in: [facility] Recovery Area, [place] [address].   Patient family has pager #   Family in: [place] [phone].   Prep by: Carmon Ginsberg, RN

## 2011-03-14 NOTE — Other (Signed)
Cardiac Wellness: Smoking cessation education information given to Kelsey [name].  Reviewed physiological effects of smoking on cardiovascular system.  Discussed making a plan for quitting, website and telephone resources and support systems.  Encouraged patient to ask physician about pharmaceutical options.  Encouraged verbalization about emotional challenges and offered potential solutions. She has quit in the past.  Patient verbalized understanding with questions answered. Emotional support given.  Ignacia Palma, RN

## 2011-03-14 NOTE — Progress Notes (Signed)
TRANSFER - IN REPORT:    Verbal report received from Anastasio Auerbach RN on [name]  being received from cath lab procedure area for routine progression of care. Report consisted of patient???s Situation, Background, Assessment and Recommendations(SBAR). Information from the following report(s) Procedure Summary was reviewed with the receiving clinician. Opportunity for questions and clarification was provided. Assessment completed upon patient???s arrival to Cardiac Cath Lab RECOVERY AREA and care assumed.       Cardiac Cath Lab Recovery Arrival Note:    Kelsey [name] arrived to CCL recovery area.  Patient procedure= LHC. Patient on cardiac monitor, non-invasive blood pressure, SPO2 monitor. On  O2 @ 2 lpm via NC.  IV  of NS on pump at 25 ml/hr. Patient status doing well without problems. Patient is A&Ox 33. Patient reports no pain. Procedure site without any bleeding and no hematoma.

## 2011-03-14 NOTE — Progress Notes (Signed)
TRANSFER - OUT REPORT:    Verbal report given to [employer] on [name] being transferred to PTU Room 425a for routine progression of care       Report consisted of patient???s Situation, Background, Assessment and   Recommendations(SBAR).     Information from the following report(s) Procedure Summary was reviewed with the receiving nurse.    Opportunity for questions and clarification was provided.

## 2011-03-14 NOTE — Progress Notes (Signed)
Pt seen - undergoing echo. No chest pain . No sob at rest.  Will await results of echo. Trop neg so far.

## 2011-03-14 NOTE — Procedures (Signed)
Cardiac Catheterization Procedure Note   Patient: Shamya H Skorupski  MRN: [id number]  SSN: [id number]   Date of Birth: 02/11/1951 Age: 59 y.o.  Sex: female    Date of Procedure: 03/14/2011   Pre-procedure Diagnosis: Shortness of Breath  Post-procedure Diagnosis: Coronary Artery Disease  Procedure: Left Heart Catheterization with Percutaneous Coronary Intervention  Operator:  Dr. Leeanna Slaby D Annitta Fifield, MD    Assistant(s):  None  Anesthesia: Moderate Sedation   Estimated Blood Loss: Less than 10 mL   Specimens Removed: None  Findings: Progressive CAD with total and chronic appearing occlusion of mid-RCA at the level of the stent and 90% stenosis after the stent in the mid-Cx.  LVEF 30% and maximum AV gradient is 20 mmHg.  No residual post stents x 2 to the Cx.  RCA not amenable to standard catheter intervention.  Well tolerated.  Complications: None   Implants:  None  Signed by:  Edyn Qazi D Caprice Mccaffrey, MD  03/14/2011  6:09 PM

## 2011-03-14 NOTE — Progress Notes (Signed)
Location: 4PTU2 - 42501  Attn.: Lake Bells  DOB: 14-Oct-1951 / Age: 60  MR#: [id number] / Admit#: [id number]  Pt. First Name: Kelsey  Pt. Last Name: [name] [name]  [address]  [place], [place]  [address]                        Case Management - Progress Note  Initial Open Date: 03/14/2011  Case Manager: Noralyn Pick    Initial Open Date:  Social Worker: Jaynie Collins  Expected Date of Discharge:  Transferred From: home  ECF Bed Held Until:  Bed Held By:  Power of Attorney:  POA/Guardian/Conservator Capacity:  Primary Caregiver: Heriberto Antigua (emerg contact) [phone]  Living Arrangements: Own Home  Source of Income:  Payee:  Psychosocial History:  Cultural/Religious/Language Issues:  Education Level:  ADLS/Current Living Arrangements Issues: Patient lives at home.  Independent, works.  Past Providers:  Will patient perform self care at discharge? Y  Anticipated Discharge Disposition Goal: Return to admission address  Assessment/Plan:   03/14/2011 09:56A  Chart reviewed for med necessity.  No  d/c needs identified.  SB Jannette Fogo, RN CRM  Resources at Discharge:  Service Providers at Discharge:  Dictating Provider:

## 2011-03-15 LAB — CK W/ CKMB & INDEX
CK - MB: 2.6 NG/ML (ref 0.5–3.6)
CK-MB Index: 2.8 — ABNORMAL HIGH (ref 0–2.5)
CK: 94 U/L (ref 26–192)

## 2011-03-15 LAB — EKG, 12 LEAD, INITIAL
Atrial Rate: 72 {beats}/min
Atrial Rate: 78 {beats}/min
Calculated P Axis: 72 degrees
Calculated P Axis: 69 degrees
Calculated R Axis: -6 degrees
Calculated R Axis: -7 degrees
Calculated T Axis: 176 degrees
Calculated T Axis: 130 degrees
Diagnosis: NORMAL
P-R Interval: 184 ms
P-R Interval: 182 ms
Q-T Interval: 436 ms
Q-T Interval: 416 ms
QRS Duration: 118 ms
QRS Duration: 120 ms
QTC Calculation (Bezet): 477 ms
QTC Calculation (Bezet): 474 ms
Ventricular Rate: 72 {beats}/min
Ventricular Rate: 78 {beats}/min

## 2011-03-15 LAB — METABOLIC PANEL, BASIC
Anion gap: 7 mmol/L (ref 5–15)
BUN/Creatinine ratio: 20 (ref 12–20)
BUN: 16 MG/DL (ref 6–20)
CO2: 29 MMOL/L (ref 21–32)
Calcium: 8.8 MG/DL (ref 8.5–10.1)
Chloride: 98 MMOL/L (ref 97–108)
Creatinine: 0.8 MG/DL (ref 0.6–1.3)
GFR est AA: >60 mL/min/{1.73_m2} (ref >60)
GFR est non-AA: >60 mL/min/{1.73_m2} (ref >60)
Glucose: 123 MG/DL — ABNORMAL HIGH (ref 65–100)
Potassium: 4.3 MMOL/L (ref 3.5–5.1)
Sodium: 134 MMOL/L — ABNORMAL LOW (ref 136–145)

## 2011-03-15 LAB — CBC WITH AUTOMATED DIFF
ABS. BASOPHILS: 0 10*3/uL (ref 0.0–0.1)
ABS. EOSINOPHILS: 0.1 10*3/uL (ref 0.0–0.4)
ABS. LYMPHOCYTES: 1.9 10*3/uL (ref 0.8–3.5)
ABS. MONOCYTES: 0.6 10*3/uL (ref 0.0–1.0)
ABS. NEUTROPHILS: 5.7 10*3/uL (ref 1.8–8.0)
BASOPHILS: 1 % (ref 0–1)
EOSINOPHILS: 1 % (ref 0–7)
HCT: 35 % (ref 35.0–47.0)
HGB: 12 g/dL (ref 11.5–16.0)
LYMPHOCYTES: 23 % (ref 12–49)
MCH: 29.6 PG (ref 26.0–34.0)
MCHC: 34.3 g/dL (ref 30.0–36.5)
MCV: 86.2 FL (ref 80.0–99.0)
MONOCYTES: 7 % (ref 5–13)
NEUTROPHILS: 68 % (ref 32–75)
PLATELET: 237 10*3/uL (ref 150–400)
RBC: 4.06 M/uL (ref 3.80–5.20)
RDW: 14.3 % (ref 11.5–14.5)
WBC: 8.3 10*3/uL (ref 3.6–11.0)

## 2011-03-15 LAB — LIPID PANEL
CHOL/HDL Ratio: 5.9 — ABNORMAL HIGH (ref 0–5.0)
Cholesterol, total: 240 MG/DL — ABNORMAL HIGH (ref <200)
HDL Cholesterol: 41 MG/DL
LDL, calculated: 141.6 MG/DL — ABNORMAL HIGH (ref 0–100)
Triglyceride: 287 MG/DL — ABNORMAL HIGH (ref <150)
VLDL, calculated: 57.4 MG/DL

## 2011-03-15 LAB — TSH 3RD GENERATION: TSH: 2.27 u[IU]/mL (ref 0.36–3.74)

## 2011-03-15 LAB — T4, FREE: T4, Free: 0.9 NG/DL (ref 0.8–1.5)

## 2011-03-15 LAB — TROPONIN I: Troponin-I, Qt.: 0.15 ng/mL — ABNORMAL HIGH (ref <0.05)

## 2011-03-15 MED ADMIN — furosemide (LASIX) tablet 40 mg: ORAL | @ 17:00:00 | NDC 68084001611

## 2011-03-15 MED ADMIN — ranolazine ER (RANEXA) tablet 500 mg: ORAL | @ 13:00:00 | NDC 61958100101

## 2011-03-15 MED ADMIN — nitroglycerin (NITROBID) 2 % ointment 1 Inch: TOPICAL | @ 06:00:00 | NDC 00281032608

## 2011-03-15 MED ADMIN — sucralfate (CARAFATE) tablet 1 g: ORAL | @ 11:00:00 | NDC 00093221001

## 2011-03-15 MED ADMIN — aspirin (ASPIRIN) tablet 325 mg: ORAL | @ 13:00:00 | NDC 00904200960

## 2011-03-15 MED ADMIN — nebivolol (BYSTOLIC) tablet 10 mg: ORAL | @ 13:00:00 | NDC 00456140530

## 2011-03-15 MED ADMIN — prasugrel (EFFIENT) tablet 10 mg: ORAL | @ 13:00:00 | NDC 00002475901

## 2011-03-15 MED ADMIN — nitroglycerin (NITROBID) 2 % ointment 1 Inch: TOPICAL | @ 13:00:00 | NDC 00281032608

## 2011-03-15 MED ADMIN — pantoprazole (PROTONIX) tablet 40 mg: ORAL | @ 13:00:00 | NDC 68084028311

## 2011-03-15 MED ADMIN — sucralfate (CARAFATE) tablet 1 g: ORAL | @ 02:00:00 | NDC 00093221001

## 2011-03-15 MED ADMIN — sucralfate (CARAFATE) tablet 1 g: ORAL | @ 21:00:00 | NDC 00093221001

## 2011-03-15 MED ADMIN — lisinopril (PRINIVIL, ZESTRIL) tablet 10 mg: ORAL | @ 17:00:00 | NDC 00904580861

## 2011-03-15 MED ADMIN — acetaminophen (TYLENOL) tablet 650 mg: ORAL | @ 21:00:00 | NDC 50580050130

## 2011-03-15 MED ADMIN — sucralfate (CARAFATE) tablet 1 g: ORAL | @ 17:00:00 | NDC 00093221001

## 2011-03-15 MED ADMIN — DULoxetine (CYMBALTA) capsule 60 mg: ORAL | @ 13:00:00 | NDC 00002324001

## 2011-03-15 MED ADMIN — ranolazine ER (RANEXA) tablet 500 mg: ORAL | NDC 61958100101

## 2011-03-15 NOTE — Progress Notes (Signed)
Problem: Pressure Ulcer - Risk of  Goal: *Prevention of pressure ulcer  Spending time out of bed in the chair  Low risk for skin breakdown  Very good personal hygiene  Turning or repositioning in the bed without assistance appropriately        Problem: Falls - Risk of  Goal: *Absence of falls  Outcome: Progressing Towards Goal  Calls for assistance appropriately  Slip resistant footwear  Clutter free room  Up ad lib but using appropriate precautions in the room  Family at the bedside during day hours  Q1 hour safety checks      Goal: *Knowledge of fall prevention  Outcome: Progressing Towards Goal  Calls for assistance appropriately  Slip resistant footwear  Clutter free room  Up ad lib but using appropriate precautions in the room  Family at the bedside during day hours  Q1 hour safety checks    Problem: Heart Failure: Day 3  Goal: Activity/Safety  Outcome: Progressing Towards Goal  Up ad lib with ambulation to the bathroom and in the hallways.  Goal: Nutrition  Outcome: Progressing Towards Goal  Tolerating all meals.  Goal: Medications  Outcome: Progressing Towards Goal  Saline lock.  Medicines- Lasix, Lisinopril, Effient, Bystolic, and ASA.  Goal: Respiratory  Outcome: Progressing Towards Goal  Room air saturations are WNL's.  Goal: Psychosocial Needs  Outcome: Progressing Towards Goal  Family support along with nursing guidance and education.  Goal: *Demonstrates progressive activity  Outcome: Progressing Towards Goal  Walking in the hallway. Up in the room.

## 2011-03-15 NOTE — Progress Notes (Signed)
Cardiology Progress Note  03/15/2011     Admit Date: 03/13/2011  Admit Diagnosis: SOB  CAD  SOB  CAD  SOB  CAD  DYSPNEA  CC: none currently    Assessment:   Active Problems:   Dyspnea (03/13/2011)    Plan:   Doing well post stent to Cx.  Not SOB and diuresed well.  No angina.  Will adjust meds and ambulate.  Level of AS is mild. LVEF 30% with chronically occluded RCA.    Volume status:euvolemic  Renal function: stable    For other plans, see orders.  Subjective: [name] reports   Chest Pain:  [x]    none,  consistent with  []    non-cardiac   []    atypical   []    angina             [x]    none now    []       on-going  Dyspnea: [x]    none  []    at rest  []    with exertion     []    improved   []    unchanged   []    worsening  PND:       [x]    none  []    overnight    Orthopnea: [x]    none  []    improved  []    unchanged  []    worsening  Presyncope: [x]    none   []    improved    []    unchanged    []    worsening  Ambulated in hallway without symptoms  []    Yes  Ambulated in room without symptoms  []    Yes    Objective:    Physical Exam:  Overall VSSAF;  BP 132/50   Pulse 79   Temp 97.5 ??F (36.4 ??C)   Resp 18   Ht 5\' 5"  (1.651 m)   Wt 217 lb 4.8 oz (98.567 kg)   BMI 36.16 kg/m2   SpO2 96%  Temp (24hrs), Avg:97.6 ??F (36.4 ??C), Min:96.1 ??F (35.6 ??C), Max:98.5 ??F (36.9 ??C)    Patient Vitals for the past 8 hrs:   Pulse   03/15/11 0805 79    03/15/11 0255 72     Patient Vitals for the past 8 hrs:   Resp   03/15/11 0805 18    03/15/11 0255 18     Patient Vitals for the past 8 hrs:   BP   03/15/11 0805 132/50 mmHg   03/15/11 0255 124/57 mmHg          Intake/Output Summary (Last 24 hours) at 03/15/11 1034  Last data filed at 03/15/11 0900   Gross per 24 hour   Intake    200 ml   Output   1120 ml   Net   -920 ml       General Appearance: Well developed, well nourished, no acute distress.   Ears/Nose/Mouth/Throat:   Normal MM; anicteric.     JVP: WNL   Resp:   Lungs clear to auscultation bilaterally.  Nl resp effort.    Cardiovascular:  RRR, S1, S2 normal, no new murmur. No gallop or rub.   Abdomen:   Soft, non-tender, bowel sounds are present.   Extremities: No edema bilaterally.    Skin:  Neuro: Warm and dry.  A/O x3, grossly nonfocal    [x]       cath site intact w/o hematoma or bruit; distal pulse unchanged.  Data Review:  Telemetry independently reviewed : [x]    sinus  []    chronic afib   []    par afib  []       NSVT    ECG independently reviewed:  [x]    NSR   [x]    no significant changes  []    no new ECG provided for review  Lab results reviewed as noted below.  Current medications reviewed as noted below.    No results found for this basename: PH:2,PCO2:2,PO2:2, in the last 72 hours  Recent Labs   [facility] 03/15/11 0300 03/14/11 1030 03/14/11 0255    CPK 94 -- --    CKMB -- -- --    TROIQ 0.15* <0.04 <0.04     Recent Labs   Basename 03/15/11 0300 03/13/11 1436    NA 134* 134*    K 4.3 3.7    CL 98 98    CO2 29 27    BUN 16 18    CREA 0.8 0.8    GLU 123* 137*    PHOS -- --    CA 8.8 8.5    ALB -- 3.4*    WBC 8.3 7.9    HGB 12.0 11.7    HCT 35.0 34.4*    PLT 237 229     Recent Labs   Basename 03/13/11 1436    SGOT 31    GPT 50    AP 53    TBIL 0.3    TP 6.3*    ALB 3.4*    GLOB 2.9    GGT --    AML --    LPSE --     No results found for this basename: INR:3,PTP:3,APTT:3, in the last 72 hours   No results found for this basename: FE:2,TIBC:2,PSAT:2,FERR:2, in the last 72 hours   Lab Results   Component Value Date/Time    POC GLUCOSE 142 03/13/2011  9:49 PM    POC GLUCOSE 138 03/13/2011  4:58 PM    POC GLUCOSE 102 03/21/2009  1:26 PM       Current Facility-Administered Medications   Medication Dose Route Frequency   ??? aspirin delayed-release tablet 81 mg  81 mg Oral DAILY   ??? furosemide (LASIX) tablet 40 mg  40 mg Oral DAILY   ??? lisinopril (PRINIVIL, ZESTRIL) tablet 10 mg  10 mg Oral DAILY   ??? influenza vaccine tr-s 11 (PF) (FLUVIRIN, FLUZONE, AFLURIA) injection 0.5 mL  0.5 mL IntraMUSCular ONCE    ??? methocarbamol (ROBAXIN) tablet 750 mg  750 mg Oral PRN   ??? nebivolol (BYSTOLIC) tablet 10 mg  10 mg Oral DAILY   ??? traMADol (ULTRAM) tablet 50 mg  50 mg Oral Q6H PRN   ??? sucralfate (CARAFATE) tablet 1 g  1 g Oral AC&HS   ??? nitroglycerin (NITROSTAT) tablet 0.4 mg  0.4 mg SubLINGual PRN   ??? DULoxetine (CYMBALTA) capsule 60 mg  60 mg Oral DAILY   ??? acetaminophen (TYLENOL) tablet 650 mg  650 mg Oral Q4H PRN   ??? prasugrel (EFFIENT) tablet 10 mg  10 mg Oral DAILY   ??? furosemide (LASIX) injection 40 mg  40 mg IntraVENous ONCE   ??? pantoprazole (PROTONIX) tablet 40 mg  40 mg Oral DAILY   ??? sodium chloride (NS) 0.9 % flush       ??? zolpidem (AMBIEN) tablet 5 mg  5 mg Oral QHS PRN        Jerrol Banana, MD

## 2011-03-15 NOTE — Other (Signed)
Addendum to previous Cardiac Wellness: Smoking cessation education information given to Kelsey [name].  Reviewed physiological effects of smoking on cardiovascular system.  Discussed making a plan for quitting, website and telephone resources and support systems.  Encouraged patient to ask physician about pharmaceutical options.  Encouraged verbalization about emotional challenges and offered potential solutions.  Patient verbalized understanding with questions answered.    Alesia Richards, RN

## 2011-03-15 NOTE — Progress Notes (Signed)
Anticipating discharge tomorrow.  Importance of not smoking emphasized.  Pleased with Dr Oscar La management.

## 2011-03-15 NOTE — Other (Signed)
Cardiac Wellness: Heart Failure education folder given to Kelsey [name] with low sodium brochure.  Discussed diagnosis definition and assessed patient understanding.  Reviewed importance of daily weight monitoring, low sodium diet (less than 2000 mg. daily), fluid intake limitation if ordered by physician. Reviewed low sodium food choices and foods high in sodium to avoid. Pt states she eats fast food for the convenience. Provided suggestions for healthy choices while eating out and for also bringing own lunch to work. Encouraged activity and rest periods within symptom limitations and as ordered by physician.  Reviewed lasix, purpose of medication, potential side effects and what to do if dose is missed.  Reviewed the "heart failure zones" with patient and determined she is in the yellow zone.   Discussed importance of reporting signs and symptoms of exacerbation  (moving into the next zone) and when to report them to the doctor to prevent re-hospitalization.  Kelsey [name] was encouraged to keep all appointments with doctor.    Discussed ability to obtain prescription meds and encouraged conversations with physician if unable to do so.  Smoking history assessed.  Pt is a smoker. Smoking cessation education provided on previous visit to pt this morning.  HF teachback questions answered by patient.  Alesia Richards, RN

## 2011-03-15 NOTE — Progress Notes (Signed)
1612-Paged/called Dr. Oscar La at this time regarding the patients Blood Pressure. Awaiting a call back.    1614-Spoke with Dr. Oscar La. Orders are to continue to monitor blood pressure and use appropriate safety precautions.

## 2011-03-15 NOTE — Other (Signed)
Cardiac Wellness: CAD education folder to bedside of Kelsey [name].  Reviewed CAD diagnosis definition and purpose of intervention.  Identified pertinent CAD risk factors including smoking, hypertension, and diabetes.  Reviewed importance of medication compliance and follow up appointments with cardiologist.  Discussed purpose of effient and potential side effects, signs and symptoms to report to physician after discharge.   Emphasized value of cardiac rehab, discussed Cardiac Wellness Program and encouraged enrollment.  Pt has  limited availability due to work schedule but she is appreciative of a follow up call. Will follow up with patient by phone after discharge for  participation.  Kelsey [name] verbalized understanding with questions answered.    Alesia Richards, RN

## 2011-03-16 LAB — METABOLIC PANEL, BASIC
Anion gap: 8 mmol/L (ref 5–15)
BUN/Creatinine ratio: 18 (ref 12–20)
BUN: 18 MG/DL (ref 6–20)
CO2: 29 MMOL/L (ref 21–32)
Calcium: 8.9 MG/DL (ref 8.5–10.1)
Chloride: 94 MMOL/L — ABNORMAL LOW (ref 97–108)
Creatinine: 1 MG/DL (ref 0.6–1.3)
GFR est AA: >60 mL/min/{1.73_m2} (ref >60)
GFR est non-AA: >60 mL/min/{1.73_m2} (ref >60)
Glucose: 154 MG/DL — ABNORMAL HIGH (ref 65–100)
Potassium: 4.3 MMOL/L (ref 3.5–5.1)
Sodium: 131 MMOL/L — ABNORMAL LOW (ref 136–145)

## 2011-03-16 LAB — EKG, 12 LEAD, INITIAL
Atrial Rate: 72 {beats}/min
Calculated P Axis: 71 degrees
Calculated R Axis: -10 degrees
Calculated T Axis: 112 degrees
P-R Interval: 184 ms
Q-T Interval: 434 ms
QRS Duration: 126 ms
QTC Calculation (Bezet): 475 ms
Ventricular Rate: 72 {beats}/min

## 2011-03-16 MED ORDER — FUROSEMIDE 40 MG TAB
40 mg | ORAL_TABLET | Freq: Every day | ORAL | Status: DC
Start: 2011-03-16 — End: 2012-05-29

## 2011-03-16 MED ORDER — ASPIRIN 81 MG TAB, DELAYED RELEASE
81 mg | ORAL_TABLET | Freq: Every day | ORAL | Status: DC
Start: 2011-03-16 — End: 2012-07-13

## 2011-03-16 MED ORDER — LISINOPRIL 5 MG TAB
5 mg | ORAL_TABLET | Freq: Every day | ORAL | Status: DC
Start: 2011-03-16 — End: 2012-05-29

## 2011-03-16 MED ORDER — PRASUGREL 10 MG TAB
10 mg | ORAL_TABLET | Freq: Every day | ORAL | Status: DC
Start: 2011-03-16 — End: 2012-07-13

## 2011-03-16 MED ADMIN — sucralfate (CARAFATE) tablet 1 g: ORAL | @ 02:00:00 | NDC 00093221001

## 2011-03-16 MED ADMIN — nebivolol (BYSTOLIC) tablet 10 mg: ORAL | @ 14:00:00 | NDC 00456140530

## 2011-03-16 MED ADMIN — acetaminophen (TYLENOL) tablet 650 mg: ORAL | @ 02:00:00 | NDC 50580050130

## 2011-03-16 MED ADMIN — prasugrel (EFFIENT) tablet 10 mg: ORAL | @ 13:00:00 | NDC 00002475901

## 2011-03-16 MED ADMIN — aspirin delayed-release tablet 81 mg: ORAL | @ 13:00:00 | NDC 00603002622

## 2011-03-16 MED ADMIN — sodium chloride (NS) 0.9 % flush: @ 09:00:00 | NDC 82903065462

## 2011-03-16 MED ADMIN — DULoxetine (CYMBALTA) capsule 60 mg: ORAL | @ 13:00:00 | NDC 00002324001

## 2011-03-16 MED ADMIN — sucralfate (CARAFATE) tablet 1 g: ORAL | @ 11:00:00 | NDC 00093221001

## 2011-03-16 MED ADMIN — sucralfate (CARAFATE) tablet 1 g: ORAL | @ 16:00:00 | NDC 00093221001

## 2011-03-16 MED ADMIN — pantoprazole (PROTONIX) tablet 40 mg: ORAL | @ 13:00:00 | NDC 68084028311

## 2011-03-16 MED ADMIN — furosemide (LASIX) tablet 40 mg: ORAL | @ 14:00:00 | NDC 68084001611

## 2011-03-16 NOTE — Progress Notes (Signed)
Spoke with patient about discharge instructions and medications concerns.  Patient unable to take a statin due to muscle pain.  Patient also has a cough with ACE and wants to continue Chantix.  Dr. Oscar La paged to clarify.      Nelly Rout, RN, BSN, CCRN

## 2011-03-16 NOTE — Discharge Summary (Signed)
Name:       Kelsey [name], Kelsey [name]                  Admitted:    03/13/2011                                               Discharged:  03/16/2011  Account #:  [id number]                     DOB:         June 10, 1951  Consultant: Otis Brace. Oscar La, MD             Age          60                                 DISCHARGE SUMMARY      FINAL DIAGNOSIS:  Unstable angina with chronic-appearing total occlusion of  the mid right coronary artery and progressive disease distal to the stent  in the circumflex system with moderately severe left ventricular  dysfunction with a left ventricular ejection fraction of 30%.    SECONDARY DIAGNOSES  1. Mild aortic stenosis.  2. Acute on chronic systolic congestive heart failure.  3. Chronic obstructive pulmonary disease with instructions to discontinue  cigarette smoking.  4. Anxiety/depression.    DISCHARGE MEDICATIONS  1. Effient 10 mg a day.  2. Aspirin 81 mg a day.  3. Furosemide 40 mg daily.  4. Losartan 25mg  daily.    CONTINUED MEDICINES  1. Omega-3 fish oil twice daily.  2. Metformin 500 1000 mg twice a day.  3. Robaxin 500 q.i.d. p.r.n.  4. Nitroglycerin 0.4 sublingual p.r.n.  5. Bystolic 10 mg daily.  6. Ergocalciferol 50,000 units a day.  7. Tussionex p.r.n.  8. Tramadol 50 mg q.6 p.r.n.  9. Sucralfate 1 g q.i.d. p.r.n.  10. Prozac 20 mg a day.  11. Nexium 40 mg a day.  12. Cymbalta 60 mg a day.  13. Vitamin D 1000 units.    DISCONTINUED MEDICINES  1. Verapamil SR.  2. Ranexa.  3. Aspirin 325.  4. Hydrochlorothiazide.  5. Chantix.  6. Plavix.    HOSPITAL COURSE: This 60 year old woman was admitted to the hospital with  episodes of shortness of breath with a history of angioplasty to the  circumflex and right coronary arteries dating back to and 1999 and 2000.  She is also known to have mild aortic stenosis. Her last catheterization  did not show any progressive disease and did not require intervention; that  was in May of 2010. Echo suggested moderate aortic stenosis and at   catheterization, there was a maximum gradient of 10 to 20 mm across aortic  valve. EF was down to 30%, so that the aortic stenosis may be somewhat more  significant, but now the midportion of the right coronary artery that had  previous stent was totally occluded. There is progressive disease in the  distal circumflex. We stented the circumflex with two 2.5-mm Endeavor  drug-coated stents with excellent results and kept her an extra day to  adjust her heart failure regimen as indicated above. Renal function has  been stable. At the time of discharge, she is in no acute distress. There  was some dizziness and lightheadedness  last night which has resolved. Plans  to discharge the patient on above regimen.    FOLLOWUP: With Dr. Theodosia Paling within a week.              Otis Brace. Oscar La, MD    cc:   Otis Brace. Oscar La, MD        Lake Bells, M.D.         Theodosia Paling, MD    MDC/wmx; D: 03/16/2011  9:55 A; T: 03/16/2011  9:14 P; DOC# [id number]; Job#  [id number]

## 2011-03-16 NOTE — Progress Notes (Signed)
[address],  [address]    [phone]  [place], [place] [address]     [email or web address]    Patient Discharge Instructions    KODIE PICK / [id number] DOB: 16-Mar-1951    Admitted 03/13/2011 Discharged: 03/16/2011     Take Home Medications     Patient's Medications   Start Taking    ASPIRIN DELAYED-RELEASE 81 MG TABLET    Take 1 Tab by mouth daily.    FUROSEMIDE (LASIX) 40 MG TABLET    Take 1 Tab by mouth daily.    LISINOPRIL (PRINIVIL, ZESTRIL) 5 MG TABLET    Take 1 Tab by mouth daily.    PRASUGREL (EFFIENT) 10 MG TABLET    Take 1 Tab by mouth daily.   Continue Taking    CHLORPHENIRAMINE-HYDROCODONE (TUSSIONEX) 8-10 MG/5 ML SUSPENSION    Take 5 mL by mouth every twelve (12) hours as needed for Cough.    CHOLECALCIFEROL, VITAMIN D3, (VITAMIN D3) 1,000 UNIT CAP    Take 5,000 Caps by mouth daily.      DULOXETINE (CYMBALTA) 60 MG CAPSULE    Take 60 mg by mouth daily.      ERGOCALCIFEROL (ERGOCALCIFEROL) 50,000 UNIT CAPSULE    Take 1 Cap by mouth two (2) days a week.    ESOMEPRAZOLE (NEXIUM) 40 MG CAPSULE    Take 40 mg by mouth daily.    Indications: GASTROESOPHAGEAL REFLUX    FLUOXETINE (PROZAC) 20 MG CAPSULE    TAKE 1 CAPSULE EVERY DAY    METFORMIN (GLUCOPHAGE) 500 MG TABLET    Take 1,000 mg by mouth two (2) times daily (with meals).    METHOCARBAMOL (ROBAXIN) 500 MG TABLET    Take  by mouth as needed.    NEBIVOLOL (BYSTOLIC) 5 MG TABLET    Take 10 mg by mouth daily.    NITROGLYCERIN (NITROSTAT) 0.4 MG SL TABLET    TAKE 1 TABLET BY MOUTH UNDER THE TONGUE EVERY 5 MINUTES AS NEEDED FOR CHEST PAIN AS DIRECTED    NITROGLYCERIN (NITROTAB SL)    0.4 mg by SubLINGual route.    OMEGA-3 FATTY ACIDS-VITAMIN E (FISH OIL) 1,000 MG CAP    Take 1,200 mg by mouth two (2) times a day.    SUCRALFATE (CARAFATE) 1 GRAM TABLET    TAKE 1 TABLET 4 TIMES A DAY BEFORE MEALS AND AT BEDTIME    TRAMADOL (ULTRAM) 50 MG TABLET    Take 1 Tab by mouth every six (6) hours as needed.   These Medications have changed    No medications on file    Stop Taking    ASPIRIN (ASPIRIN) 325 MG TABLET    Take 325 mg by mouth daily.    HYDROCHLOROTHIAZIDE (MICROZIDE) 12.5 MG CAPSULE    TAKE 1 CAPSULE EVERY DAY    PLAVIX 75 MG TABLET    TAKE 1 TABLET BY MOUTH EVERY DAY    RANOLAZINE (RANEXA) 500 MG SR TABLET    Take 1 Tab by mouth two (2) times a day.    VARENICLINE (CHANTIX CONTINUING MONTH PAK) 1 MG TABLET    USE AS DIRECTED    VERAPAMIL SR (CALAN-SR) 180 MG CR TABLET    Take 1 Tab by mouth daily.        ?? It is important that you take the medication exactly as they are prescribed.   ?? Keep your medication in the bottles provided by the pharmacist and keep a list of the medication names, dosages, and times  to be taken in your wallet.   ?? Do not take other medications without consulting your doctor.     BRING ALL OF YOUR MEDICINES TO YOUR OFFICE VISIT with Dr..me.    What to do at Home    Recommended activity: activity as tolerated.    Follow-up with Theodosia Paling, MDin 1 week  Follow-up with your primary care physician in 1 month    Lifestyle changes  Do not smoke. Smoking increases your risk of another heart attack. If you need help quitting, talk to your doctor about stop-smoking programs and medicines. These can increase your chances of quitting for good.    Eat a heart-healthy diet that is low in cholesterol, saturated fat, and salt, and is full of fruits, vegetables and whole-grains. Eat at least two servings of fish each week. You may get more details about how to eat healthy, but these tips can help you get started.    Avoid colds and flu. Get a pneumococcal vaccine shot. If you have had one before, ask your doctor whether you need a second dose. Get a flu shot every fall. If you must be around people with colds or flu, wash your hands often.    When should you call for help?    Call 911 anytime you think you may need emergency care. For example, call if:  You have signs of a heart attack. These may include:  Chest pain or pressure.  Sweating.   Shortness of breath.  Nausea or vomiting.  Pain that spreads from the chest to the neck, jaw, or one or both shoulders or arms.  Dizziness or lightheadedness.  A fast or irregular pulse.  After calling 911, chew 1 adult-strength aspirin. Wait for an ambulance. Do not try to drive yourself.  You passed out (lost consciousness).  You feel like you are having another heart attack.    Call your doctor now or seek immediate medical care if:  You have had any chest pain, even if it has gone away.  You have new or increased shortness of breath.  You are dizzy or lightheaded, or you feel like you may faint.    Watch closely for changes in your health, and be sure to contact your doctor if you have any problem      Information obtained by :  I understand that if any problems occur once I am at home I am to contact my physician.    I understand and acknowledge receipt of the instructions indicated above.                                                                                                                                           R.N.'s Signature  Date/Time                                                                                                                                              Patient or Representative Signature                                                          Date/Time      Jerrol Banana, MD         [address]   [phone]  [place], [place] [address]    [email or web address]

## 2011-03-17 MED ORDER — METFORMIN SR 500 MG 24 HR TABLET
500 mg | ORAL_TABLET | ORAL | Status: DC
Start: 2011-03-17 — End: 2011-09-25

## 2011-03-19 NOTE — Telephone Encounter (Signed)
Cardiac Wellness: Called Ms. [name] on Wed., March 19, 2011 to discuss participation in the Cardiac Wellness Program following coronary stent placement on March 14, 2011.  Left voicemail message.

## 2011-03-21 LAB — AMB POC URINALYSIS DIP STICK MANUAL W/O MICRO
Bilirubin (UA POC): NEGATIVE
Blood (UA POC): NEGATIVE
Glucose (UA POC): NEGATIVE
Ketones (UA POC): NEGATIVE
Nitrites (UA POC): NEGATIVE
Protein (UA POC): NEGATIVE mg/dL
Specific gravity (UA POC): 1.005 (ref 1.001–1.035)
Urobilinogen (UA POC): 0.2
pH (UA POC): 5 (ref 4.6–8.0)

## 2011-03-21 LAB — AMB POC COMPLETE CBC,AUTOMATED ENTER
ABS. LYMPHS (POC): 2.9 10*3/uL (ref 1.2–3.4)
HCT (POC): 39.6 % (ref 35.0–60.0)
HGB (POC): 12.2 g/dL (ref 12.0–16.0)
LYMPHOCYTES (POC): 36.2 % (ref 20.5–51.1)
MCH (POC): 28.3 pg (ref 27.0–31.0)
MCHC (POC): 30.8 g/dL — AB (ref 33.0–37.0)
MCV (POC): 91.7 fL (ref 80.0–99.9)
PLATELET (POC): 305 10*3/uL (ref 150–450)
RBC (POC): 4.32 M/uL (ref 4.00–6.00)
WBC (POC): 8 10*3/uL (ref 4.5–10.5)

## 2011-03-21 NOTE — Patient Instructions (Addendum)
MyChart Activation    Thank you for requesting access to MyChart. Please follow the instructions below to securely access and download your online medical record. MyChart allows you to send messages to your doctor, view your test results, renew your prescriptions, schedule appointments, and more.    How Do I Sign Up?    1. In your internet browser, go to [email or web address]  2. Click on the First Time User? Click Here link in the Sign In box. You will be redirect to the New Member Sign Up page.  3. Enter your MyChart Access Code exactly as it appears below. You will not need to use this code after you???ve completed the sign-up process. If you do not sign up before the expiration date, you must request a new code.    MyChart Access Code: [email or web address]  Expires: 06/14/2011 10:36 AM (This is the date your MyChart access code will expire)    4. Enter the last four digits of your Social Security Number (xxxx) and Date of Birth (mm/dd/yyyy) as indicated and click Submit. You will be taken to the next sign-up page.  5. Create a MyChart ID. This will be your MyChart login ID and cannot be changed, so think of one that is secure and easy to remember.  6. Create a MyChart password. You can change your password at any time.  7. Enter your Password Reset Question and Answer. This can be used at a later time if you forget your password.   8. Enter your e-mail address. You will receive e-mail notification when new information is available in MyChart.  9. Click Sign Up. You can now view and download portions of your medical record.  10. Click the Download Summary menu link to download a portable copy of your medical information.    Additional Information    If you have questions, please call [phone]. Remember, MyChart is NOT to be used for urgent needs. For medical emergencies, dial 911.      Increase metformin to 5 daily.

## 2011-03-21 NOTE — Progress Notes (Signed)
Kelsey [name] is a 60 y.o. female and presents with   Chief Complaint   Patient presents with   ??? Blood sugar problem     blood sugar 249 this AM   .blood sugars running higher- 267 this am, 160 yest am.still having sob.-  Sl better, sl  Cough at night- probable sinus drainage.  No nausea or vomiting. No dysuria. Pain is better in back.- had epidural 2  Weeks before hosp       '      Current Outpatient Prescriptions   Medication Sig Dispense Refill   ??? metFORMIN ER (GLUCOPHAGE XR) 500 mg tablet TAKE 5 TABLETS BY MOUTH DAILY  450 Tab  1   ??? furosemide (LASIX) 40 mg tablet Take 1 Tab by mouth daily.  30 Tab  11   ??? lisinopril (PRINIVIL, ZESTRIL) 5 mg tablet Take 1 Tab by mouth daily.  30 Tab  11   ??? prasugrel (EFFIENT) 10 mg tablet Take 1 Tab by mouth daily.  30 Tab  11   ??? aspirin delayed-release 81 mg tablet Take 1 Tab by mouth daily.  30 Tab  11   ??? esomeprazole (NEXIUM) 40 mg capsule Take 40 mg by mouth daily.    Indications: GASTROESOPHAGEAL REFLUX       ??? DULoxetine (CYMBALTA) 60 mg capsule Take 60 mg by mouth daily.         ??? Cholecalciferol, Vitamin D3, (VITAMIN D3) 1,000 unit Cap Take 5,000 Caps by mouth daily.         ??? nitroglycerin (NITROSTAT) 0.4 mg SL tablet TAKE 1 TABLET BY MOUTH UNDER THE TONGUE EVERY 5 MINUTES AS NEEDED FOR CHEST PAIN AS DIRECTED  50 Tab  1   ??? sucralfate (CARAFATE) 1 gram tablet TAKE 1 TABLET 4 TIMES A DAY BEFORE MEALS AND AT BEDTIME  240 Tab  2   ??? chlorpheniramine-HYDROcodone (TUSSIONEX) 8-10 mg/5 mL suspension Take 5 mL by mouth every twelve (12) hours as needed for Cough.  120 mL  1   ??? traMADol (ULTRAM) 50 mg tablet Take 1 Tab by mouth every six (6) hours as needed.  30 Tab  1   ??? ergocalciferol (ERGOCALCIFEROL) 50,000 unit capsule Take 1 Cap by mouth two (2) days a week.  8 Cap  0   ??? nebivolol (BYSTOLIC) 5 mg tablet Take 10 mg by mouth daily.       ??? omega-3 fatty acids-vitamin e (FISH OIL) 1,000 mg Cap Take 1,200 mg by mouth two (2) times a day.        ??? metformin (GLUCOPHAGE) 500 mg tablet Take 1,000 mg by mouth two (2) times daily (with meals).       ??? methocarbamol (ROBAXIN) 500 mg tablet Take  by mouth as needed.       ??? NITROGLYCERIN (NITROTAB SL) 0.4 mg by SubLINGual route.         Allergies   Allergen Reactions   ??? Avalide (Irbesartan-Hydrochlorothiazide) Myalgia   ??? Demerol (Meperidine) Unknown (comments)   ??? Pcn (Penicillins) Swelling   ??? Sulfa(Sulfonamide Antibiotics) Unknown (comments)   ??? Tricor (Fenofibrate Micronized) Myalgia     Past Medical History   Diagnosis Date   ??? Hypertension    ??? CAD (coronary artery disease)    ??? Chronic pain    ??? Arthritis    ??? COPD    ??? Diabetes      Past Surgical History   Procedure Date   ??? Hx orthopaedic  rt knee   ??? Cardiac surg procedure unlist      PTCA     Family History   Problem Relation Age of Onset   ??? Lung Disease Mother    ??? Stroke Father      History   Substance Use Topics   ??? Smoking status: Current Some Day Smoker -- 0.3 packs/day for 45 years     Types: Cigarettes   ??? Smokeless tobacco: Never Used    Comment: passed ready   ??? Alcohol Use: No      may be 3 times a year if that          Objective:  Wt 216 lb 8 oz (98.204 kg)  wdwn 60 yo wf-  In NAD. A&O.  HEENT -- Pupils round.  O/P Clear.  Neck -- Supple. No JVD.no acn  Heart -- RRR. 2/6 asm  Lungs -- Coarse breath sounds  Abdomen -- Soft. Non-tender. Non-distended. No masses. Bowel sounds present.  Extremities -- No edema.no lesions      Results for orders placed in visit on 03/21/11   AMB POC URINALYSIS DIP STICK MANUAL W/O MICRO       Component Value Range    Color Yellow  (none)     Clarity Clear  (none)     Glucose Negative  (none)     Bilirubin Negative  (none)     Ketones Negative  (none)     Spec.Grav. 1.005  1.001 - 1.035     Blood Negative  (none)     pH 5.0  4.6 - 8.0     Protein Negative  UJW:JXBJYNWG(NF/AO)    Urobilinogen 0.2 mg/dL      Nitrites Negative  (none)     Leukocyte esterase Trace  (none)     AMB POC COMPLETE CBC,AUTOMATED ENTER       Component Value Range    WBC COUNT 8.0  4.5 - 10.5 (K/uL)    RBC 4.32  4.00 - 6.00 (M/uL)    HGB 12.2  12.0 - 16.0 (g/dL)    HCT 13.0  86.5 - 78.4 (%)    MCV 91.7  80.0 - 99.9 (fL)    MCH 28.3  27.0 - 31.0 (pg)    MCHC 30.8 (*) 33.0 - 37.0 (g/dL)    PLATELET 696  295 - 450 (K/uL)    LYMPHOCYTE % 36.2  20.5 - 51.1 (%)    LYMPHOCYTE # 2.9  1.2 - 3.4 (K/uL)         Assessment/Plan:  1. Coronary atherosclerosis of native coronary artery  METABOLIC PANEL, COMPREHENSIVE   2. Mixed hyperlipidemia  AMB POC COMPLETE CBC,AUTOMATED ENTER, COLLECTION VENOUS BLOOD,VENIPUNCTURE   3. DM w/o complication type II  AMB POC URINALYSIS DIP STICK MANUAL W/O MICRO   restart metformin at 5/ day- she has only been taking 4 daily. Call with blood sugars Monday.        Author:  Lake Bells, MD 03/21/2011 11:49 AM

## 2011-03-22 LAB — METABOLIC PANEL, COMPREHENSIVE
A-G Ratio: 1.7 (ref 1.1–2.5)
ALT (SGPT): 61 IU/L — ABNORMAL HIGH (ref 0–40)
AST (SGOT): 52 IU/L — ABNORMAL HIGH (ref 0–40)
Albumin: 4.1 g/dL (ref 3.6–4.8)
Alk. phosphatase: 74 IU/L (ref 25–165)
BUN/Creatinine ratio: 25 (ref 11–26)
BUN: 21 mg/dL (ref 8–27)
Bilirubin, total: 0.4 mg/dL (ref 0.0–1.2)
CO2: 29 mmol/L (ref 20–32)
Calcium: 9.9 mg/dL (ref 8.6–10.2)
Chloride: 91 mmol/L — ABNORMAL LOW (ref 97–108)
Creatinine: 0.85 mg/dL (ref 0.57–1.00)
GFR est AA: 86 mL/min/{1.73_m2} (ref >59)
GFR est non-AA: 75 mL/min/{1.73_m2} (ref >59)
GLOBULIN, TOTAL: 2.4 g/dL (ref 1.5–4.5)
Glucose: 145 mg/dL — ABNORMAL HIGH (ref 65–99)
Potassium: 4.8 mmol/L (ref 3.5–5.2)
Protein, total: 6.5 g/dL (ref 6.0–8.5)
Sodium: 132 mmol/L — ABNORMAL LOW (ref 134–144)

## 2011-04-15 NOTE — Progress Notes (Signed)
Quick Note:    Called patient and relayed doctor's message  ______

## 2011-04-15 NOTE — Progress Notes (Signed)
Quick Note:    pls call- labs ok, liver enzymes mildly elevated. How are blood sugars?  ______

## 2011-04-30 LAB — AMB POC COMPLETE CBC,AUTOMATED ENTER
ABS. LYMPHS (POC): 2.9 10*3/uL (ref 1.2–3.4)
HCT (POC): 34.6 % — AB (ref 35.0–60.0)
HGB (POC): 11.1 g/dL — AB (ref 12.0–16.0)
LYMPHOCYTES (POC): 35.9 % (ref 20.5–51.1)
MCH (POC): 28.9 pg (ref 27.0–31.0)
MCHC (POC): 32 g/dL — AB (ref 33.0–37.0)
MCV (POC): 90.3 fL (ref 80.0–99.9)
PLATELET (POC): 309 10*3/uL (ref 150–450)
RBC (POC): 3.84 M/uL — AB (ref 4.00–6.00)
WBC (POC): 8.2 10*3/uL (ref 4.5–10.5)

## 2011-04-30 LAB — AMB POC URINALYSIS DIP STICK AUTO W/ MICRO
Bilirubin (UA POC): NEGATIVE
Blood (UA POC): NEGATIVE
Glucose (UA POC): NEGATIVE
Ketones (UA POC): NEGATIVE
Nitrites (UA POC): NEGATIVE
Protein (UA POC): NEGATIVE mg/dL
Specific gravity (UA POC): 1.01 (ref 1.001–1.035)
Urobilinogen (UA POC): 0.2
pH (UA POC): 5.5 (ref 4.6–8.0)

## 2011-04-30 MED ORDER — PANTOPRAZOLE 40 MG TAB, DELAYED RELEASE
40 mg | ORAL_TABLET | Freq: Every day | ORAL | Status: DC
Start: 2011-04-30 — End: 2011-06-28

## 2011-04-30 NOTE — Progress Notes (Signed)
Kelsey [name] is a 60 y.o. female and presents with   Chief Complaint   Patient presents with   ??? Breathing Problem   .still sob, no better since she was in hospital.. Worse than it was prior to hosp.  Feels horrible.  Tired, has shooting pains all over.  Feels weak.  "crappy tramodol" not helping back pain much.  bp 139/79 last pm. Last smoked on the way to the hospital in March. Has been constipated.Calves are swelling.  Cough is gone.  No fever or chills. No rashes. Wants to switch to protonix. Called Dr. Leonides Sake office. He is out of town, nurse told her to stop statin. Has not had another back injection due to being on the effient.      Current Outpatient Prescriptions   Medication Sig Dispense Refill   ??? pantoprazole (PROTONIX) 40 mg tablet Take 1 Tab by mouth daily.  30 Tab  1   ??? metFORMIN ER (GLUCOPHAGE XR) 500 mg tablet TAKE 5 TABLETS BY MOUTH DAILY  450 Tab  1   ??? furosemide (LASIX) 40 mg tablet Take 1 Tab by mouth daily.  30 Tab  11   ??? lisinopril (PRINIVIL, ZESTRIL) 5 mg tablet Take 1 Tab by mouth daily.  30 Tab  11   ??? prasugrel (EFFIENT) 10 mg tablet Take 1 Tab by mouth daily.  30 Tab  11   ??? aspirin delayed-release 81 mg tablet Take 1 Tab by mouth daily.  30 Tab  11   ??? DULoxetine (CYMBALTA) 60 mg capsule Take 60 mg by mouth daily.         ??? Cholecalciferol, Vitamin D3, (VITAMIN D3) 1,000 unit Cap Take 5,000 Caps by mouth daily.         ??? nitroglycerin (NITROSTAT) 0.4 mg SL tablet TAKE 1 TABLET BY MOUTH UNDER THE TONGUE EVERY 5 MINUTES AS NEEDED FOR CHEST PAIN AS DIRECTED  50 Tab  1   ??? chlorpheniramine-HYDROcodone (TUSSIONEX) 8-10 mg/5 mL suspension Take 5 mL by mouth every twelve (12) hours as needed for Cough.  120 mL  1   ??? traMADol (ULTRAM) 50 mg tablet Take 1 Tab by mouth every six (6) hours as needed.  30 Tab  1   ??? ergocalciferol (ERGOCALCIFEROL) 50,000 unit capsule Take 1 Cap by mouth two (2) days a week.  8 Cap  0   ??? nebivolol (BYSTOLIC) 5 mg tablet Take 10 mg by mouth daily.        ??? omega-3 fatty acids-vitamin e (FISH OIL) 1,000 mg Cap Take 1,200 mg by mouth two (2) times a day.       ??? metformin (GLUCOPHAGE) 500 mg tablet Take 1,000 mg by mouth two (2) times daily (with meals).       ??? methocarbamol (ROBAXIN) 500 mg tablet Take  by mouth as needed.       ??? NITROGLYCERIN (NITROTAB SL) 0.4 mg by SubLINGual route.         Allergies   Allergen Reactions   ??? Avalide (Irbesartan-Hydrochlorothiazide) Myalgia   ??? Demerol (Meperidine) Unknown (comments)   ??? Pcn (Penicillins) Swelling   ??? Sulfa(Sulfonamide Antibiotics) Unknown (comments)   ??? Tricor (Fenofibrate Micronized) Myalgia     Past Medical History   Diagnosis Date   ??? Hypertension    ??? CAD (coronary artery disease)    ??? Chronic pain    ??? Arthritis    ??? COPD    ??? Diabetes      Past  Surgical History   Procedure Date   ??? Hx orthopaedic      rt knee   ??? Cardiac surg procedure unlist      PTCA     Family History   Problem Relation Age of Onset   ??? Lung Disease Mother    ??? Stroke Father      History   Substance Use Topics   ??? Smoking status: Current Some Day Smoker -- 0.3 packs/day for 45 years     Types: Cigarettes   ??? Smokeless tobacco: Never Used    Comment: passed ready   ??? Alcohol Use: No      may be 3 times a year if that          Objective:  BP 104/70   Ht 5\' 5"  (1.651 m)   Wt 220 lb (99.791 kg)   BMI 36.61 kg/m2   SpO2 97%  Heavy 59 yo wf-nad pox- 95 % with exertion  In NAD. A&O.  HEENT -- Pupils round.  O/P Clear.  Tm- intact   Neck -- Supple. No JVD.no acn  Heart -- RRR. 2/6 asm  Lungs -- CTA.  Abdomen -- Soft. Non-tender. Non-distended. No masses. Bowel sounds present.  Extremities -- min edema.      Results for orders placed in visit on 04/30/11   AMB POC COMPLETE CBC,AUTOMATED ENTER       Component Value Range    WBC COUNT 8.2  4.5 - 10.5 (K/uL)    RBC 3.84 (*) 4.00 - 6.00 (M/uL)    HGB 11.1 (*) 12.0 - 16.0 (g/dL)    HCT 30.8 (*) 65.7 - 60.0 (%)    MCV 90.3  80.0 - 99.9 (fL)    MCH 28.9  27.0 - 31.0 (pg)     MCHC 32.0 (*) 33.0 - 37.0 (g/dL)    PLATELET 846  962 - 450 (K/uL)    LYMPHOCYTE % 35.9  20.5 - 51.1 (%)    LYMPHOCYTE # 2.9  1.2 - 3.4 (K/uL)   AMB POC URINALYSIS DIP STICK AUTO W/ MICRO       Component Value Range    Color Yellow  (none)     Clarity Clear  (none)     Glucose Negative  (none)     Bilirubin Negative  (none)     Ketones Negative  (none)     Spec.Grav. 1.010  1.001 - 1.035     Blood Negative  (none)     pH 5.5  4.6 - 8.0     Protein neg  XBM:[id number](NU/UV)    Urobilinogen 0.2 mg/dL      Nitrites Negative  (none)     Leukocyte esterase 1+  (none)          Assessment/Plan:  1. Dyspnea  AMB POC COMPLETE CBC,AUTOMATED ENTER, AMB POC URINALYSIS DIP STICK AUTO W/ MICRO, pantoprazole (PROTONIX) 40 mg tablet, REFERRAL TO PULMONARY DISEASE   2. Coronary atherosclerosis of native coronary artery     3. Mixed hyperlipidemia  CK, COLLECTION VENOUS BLOOD,VENIPUNCTURE, TSH, 3RD GENERATION, METABOLIC PANEL, COMPREHENSIVE   4. DM w/o complication type II  AMB POC URINALYSIS DIP STICK AUTO W/ MICRO   5. HTN (hypertension)  AMB POC URINALYSIS DIP STICK AUTO W/ MICRO   6. GERD (gastroesophageal reflux disease)  pantoprazole (PROTONIX) 40 mg tablet   7. Macrocytic anemia  VITAMIN B12     Given hydrocodone 5/500 one daily- for pain #30-1      Author:  Lake Bells, MD 04/30/2011 2:12 PM

## 2011-04-30 NOTE — Patient Instructions (Addendum)
MyChart Activation    Thank you for requesting access to MyChart. Please follow the instructions below to securely access and download your online medical record. MyChart allows you to send messages to your doctor, view your test results, renew your prescriptions, schedule appointments, and more.    How Do I Sign Up?    1. In your internet browser, go to [email or web address]  2. Click on the First Time User? Click Here link in the Sign In box. You will be redirect to the New Member Sign Up page.  3. Enter your MyChart Access Code exactly as it appears below. You will not need to use this code after you???ve completed the sign-up process. If you do not sign up before the expiration date, you must request a new code.    MyChart Access Code: Not generated  Current MyChart Status: Active (This is the date your MyChart access code will expire)    4. Enter the last four digits of your Social Security Number (xxxx) and Date of Birth (mm/dd/yyyy) as indicated and click Submit. You will be taken to the next sign-up page.  5. Create a MyChart ID. This will be your MyChart login ID and cannot be changed, so think of one that is secure and easy to remember.  6. Create a MyChart password. You can change your password at any time.  7. Enter your Password Reset Question and Answer. This can be used at a later time if you forget your password.   8. Enter your e-mail address. You will receive e-mail notification when new information is available in MyChart.  9. Click Sign Up. You can now view and download portions of your medical record.  10. Click the Download Summary menu link to download a portable copy of your medical information.    Additional Information    Remember, MyChart is NOT to be used for urgent needs. For medical emergencies, dial 911.      Results for orders placed in visit on 04/30/11   AMB POC COMPLETE CBC,AUTOMATED ENTER       Component Value Range    WBC COUNT 8.2  4.5 - 10.5 (K/uL)     RBC 3.84 (*) 4.00 - 6.00 (M/uL)    HGB 11.1 (*) 12.0 - 16.0 (g/dL)    HCT 91.4 (*) 78.2 - 60.0 (%)    MCV 90.3  80.0 - 99.9 (fL)    MCH 28.9  27.0 - 31.0 (pg)    MCHC 32.0 (*) 33.0 - 37.0 (g/dL)    PLATELET 956  213 - 450 (K/uL)    LYMPHOCYTE % 35.9  20.5 - 51.1 (%)    LYMPHOCYTE # 2.9  1.2 - 3.4 (K/uL)   AMB POC URINALYSIS DIP STICK AUTO W/ MICRO       Component Value Range    Color Yellow  (none)     Clarity Clear  (none)     Glucose Negative  (none)     Bilirubin Negative  (none)     Ketones Negative  (none)     Spec.Grav. 1.010  1.001 - 1.035     Blood Negative  (none)     pH 5.5  4.6 - 8.0     Protein neg  YQM:VHQIONGE(XB/MW)    Urobilinogen 0.2 mg/dL      Nitrites Negative  (none)     Leukocyte esterase 1+  (none)      Hydrocodone 1 daily #30-1

## 2011-05-01 LAB — METABOLIC PANEL, COMPREHENSIVE
A-G Ratio: 1.8 (ref 1.1–2.5)
ALT (SGPT): 37 IU/L (ref 0–40)
AST (SGOT): 30 IU/L (ref 0–40)
Albumin: 4.2 g/dL (ref 3.6–4.8)
Alk. phosphatase: 64 IU/L (ref 25–165)
BUN/Creatinine ratio: 20 (ref 11–26)
BUN: 18 mg/dL (ref 8–27)
Bilirubin, total: 0.2 mg/dL (ref 0.0–1.2)
CO2: 25 mmol/L (ref 20–32)
Calcium: 9.7 mg/dL (ref 8.6–10.2)
Chloride: 95 mmol/L — ABNORMAL LOW (ref 97–108)
Creatinine: 0.89 mg/dL (ref 0.57–1.00)
GFR est non-AA: 71 mL/min/{1.73_m2} (ref >59)
GLOBULIN, TOTAL: 2.4 g/dL (ref 1.5–4.5)
Glucose: 117 mg/dL — ABNORMAL HIGH (ref 65–99)
Potassium: 4.7 mmol/L (ref 3.5–5.2)
Protein, total: 6.6 g/dL (ref 6.0–8.5)
Sodium: 134 mmol/L (ref 134–144)
eGFR If African American: 81 mL/min/{1.73_m2} (ref >59)

## 2011-05-01 LAB — CK: Creatine Kinase,Total: 93 U/L (ref 24–173)

## 2011-05-01 LAB — TSH 3RD GENERATION: TSH: 3.07 u[IU]/mL (ref 0.450–4.500)

## 2011-05-02 LAB — WRITTEN AUTHORIZATION

## 2011-05-02 NOTE — Telephone Encounter (Signed)
Patient was told she was a little anemic and take vit B 12   She was advised to take 100 mg

## 2011-05-02 NOTE — Telephone Encounter (Signed)
Message copied by Sudie Grumbling on Fri May 02, 2011  9:36 AM  ------       Message from: [place], [place] R       Created: Fri May 02, 2011  9:18 AM       Regarding: no message         JOANNA BORAWSKI is calling to speak to Clydie Braun; no message. Ph # is [phone].

## 2011-05-03 LAB — VITAMIN B12: Vitamin B12: 335 pg/mL (ref 211–946)

## 2011-05-21 NOTE — Progress Notes (Signed)
Quick Note:    Called patient and relayed doctor's message  ______

## 2011-05-21 NOTE — Progress Notes (Signed)
Quick Note:    pls call- labs ok except for anemia. How are you? If not better, consider cardiac rehab  ______

## 2011-06-28 MED ORDER — PANTOPRAZOLE 40 MG TAB, DELAYED RELEASE
40 mg | ORAL_TABLET | ORAL | Status: DC
Start: 2011-06-28 — End: 2011-08-26

## 2011-06-29 MED ORDER — METHOCARBAMOL 500 MG TAB
500 mg | ORAL_TABLET | ORAL | Status: DC
Start: 2011-06-29 — End: 2011-07-02

## 2011-07-09 MED ORDER — METHYLPREDNISOLONE 40 MG/ML SUSP FOR INJECTION
40 mg/mL | Freq: Once | INTRAMUSCULAR | Status: AC
Start: 2011-07-09 — End: 2011-07-09
  Administered 2011-07-09: 12:00:00 via INTRAMUSCULAR

## 2011-07-09 MED ORDER — METHOCARBAMOL 500 MG TAB
500 mg | ORAL_TABLET | ORAL | Status: DC
Start: 2011-07-09 — End: 2015-10-10

## 2011-07-09 MED ORDER — LIDOCAINE (PF) 10 MG/ML (1 %) IJ SOLN
10 mg/mL (1 %) | Freq: Once | INTRAMUSCULAR | Status: AC
Start: 2011-07-09 — End: 2011-07-09
  Administered 2011-07-09: 12:00:00 via SUBCUTANEOUS

## 2011-07-09 MED ORDER — IOHEXOL 180 MG/ML SOLN
180 mg iodine/mL | Freq: Once | INTRATHECAL | Status: AC
Start: 2011-07-09 — End: 2011-07-09
  Administered 2011-07-09: 12:00:00 via INTRATHECAL

## 2011-07-09 NOTE — Progress Notes (Signed)
OPt arrived in unit ambualtory at 0730 awake alert without c/o.

## 2011-07-09 NOTE — Progress Notes (Signed)
Pt. D/c'ed to home and transported to d/c lot via w/c, verbalized understanding of d/c instructions.

## 2011-08-19 MED ORDER — NITROSTAT 0.4 MG SUBLINGUAL TABLET
0.4 mg | ORAL_TABLET | SUBLINGUAL | Status: DC
Start: 2011-08-19 — End: 2012-06-02

## 2011-08-26 MED ORDER — PANTOPRAZOLE 40 MG TAB, DELAYED RELEASE
40 mg | ORAL_TABLET | ORAL | Status: DC
Start: 2011-08-26 — End: 2011-10-29

## 2011-09-24 MED ORDER — CYMBALTA 60 MG CAPSULE,DELAYED RELEASE
60 mg | ORAL_CAPSULE | ORAL | Status: DC
Start: 2011-09-24 — End: 2012-03-30

## 2011-09-25 MED ORDER — METFORMIN SR 500 MG 24 HR TABLET
500 mg | ORAL_TABLET | ORAL | Status: DC
Start: 2011-09-25 — End: 2012-03-25

## 2011-10-01 MED ORDER — IOHEXOL 180 MG/ML SOLN
180 mg iodine/mL | Freq: Once | INTRATHECAL | Status: AC
Start: 2011-10-01 — End: 2011-10-01
  Administered 2011-10-01: 16:00:00 via INTRATHECAL

## 2011-10-01 MED ORDER — METHYLPREDNISOLONE 40 MG/ML SUSP FOR INJECTION
40 mg/mL | Freq: Once | INTRAMUSCULAR | Status: AC
Start: 2011-10-01 — End: 2011-10-01
  Administered 2011-10-01: 16:00:00 via INTRAMUSCULAR

## 2011-10-01 MED ORDER — LIDOCAINE (PF) 10 MG/ML (1 %) IJ SOLN
10 mg/mL (1 %) | Freq: Once | INTRAMUSCULAR | Status: AC
Start: 2011-10-01 — End: 2011-10-01
  Administered 2011-10-01: 16:00:00 via SUBCUTANEOUS

## 2011-10-01 NOTE — Progress Notes (Signed)
Pt post procedure with some numbness right upper leg. Able to stand and step without difficulty gait steady. Bandaid to right mid back dry and intact no bleeding/drainage noted

## 2011-10-29 MED ORDER — PANTOPRAZOLE 40 MG TAB, DELAYED RELEASE
40 mg | ORAL_TABLET | ORAL | Status: DC
Start: 2011-10-29 — End: 2012-09-02

## 2011-11-10 LAB — AMB POC URINALYSIS DIP STICK AUTO W/O MICRO
Bilirubin (UA POC): NEGATIVE
Blood (UA POC): NEGATIVE
Glucose (UA POC): NEGATIVE
Ketones (UA POC): NEGATIVE
Leukocyte esterase (UA POC): NEGATIVE
Nitrites (UA POC): NEGATIVE
Protein (UA POC): NEGATIVE mg/dL
Specific gravity (UA POC): 1.005 (ref 1.001–1.035)
Urobilinogen (UA POC): 0.2 (ref 0.2–1)
pH (UA POC): 6 (ref 4.6–8.0)

## 2011-11-10 LAB — AMB POC COMPLETE CBC,AUTOMATED ENTER
ABS. LYMPHS (POC): 3 10*3/uL (ref 1.2–3.4)
HCT (POC): 38.6 % (ref 35.0–60.0)
HGB (POC): 12.6 g/dL (ref 12.0–16.0)
LYMPHOCYTES (POC): 31.7 % (ref 20.5–51.1)
MCH (POC): 29.7 pg (ref 27.0–31.0)
MCHC (POC): 32.5 g/dL — AB (ref 33.0–37.0)
MCV (POC): 91.3 fL (ref 80.0–99.9)
PLATELET (POC): 292 10*3/uL (ref 150–450)
RBC (POC): 4.23 M/uL (ref 4.00–6.00)
WBC (POC): 9.6 10*3/uL (ref 4.5–10.5)

## 2011-11-10 LAB — CREATININE, POC
Creatinine (POC): 0.8 MG/DL (ref 0.6–1.3)
GFRAA, POC: >60 mL/min/{1.73_m2} (ref >60)
GFRNA, POC: >60 mL/min/{1.73_m2} (ref >60)

## 2011-11-10 LAB — AMB POC HEMOGLOBIN A1C: Hemoglobin A1c (POC): 7.5 % — AB (ref 4.8–5.6)

## 2011-11-10 MED ORDER — IOVERSOL 350 MG/ML IV SOLN
350 mg iodine/mL | Freq: Once | INTRAVENOUS | Status: AC
Start: 2011-11-10 — End: 2011-11-10
  Administered 2011-11-10: 19:00:00 via INTRAVENOUS

## 2011-11-10 MED ORDER — LEVOFLOXACIN 500 MG TAB
500 mg | ORAL_TABLET | Freq: Every day | ORAL | Status: AC
Start: 2011-11-10 — End: 2011-11-20

## 2011-11-10 MED ORDER — SODIUM CHLORIDE 0.9 % IJ SYRG
Freq: Once | INTRAMUSCULAR | Status: AC
Start: 2011-11-10 — End: 2011-11-10
  Administered 2011-11-10: 19:00:00 via INTRAVENOUS

## 2011-11-10 MED ORDER — SODIUM CHLORIDE 0.9% BOLUS IV
0.9 % | Freq: Once | INTRAVENOUS | Status: AC
Start: 2011-11-10 — End: 2011-11-10
  Administered 2011-11-10: 19:00:00 via INTRAVENOUS

## 2011-11-10 NOTE — Patient Instructions (Signed)
MyChart Activation    Thank you for requesting access to MyChart. Please follow the instructions below to securely access and download your online medical record. MyChart allows you to send messages to your doctor, view your test results, renew your prescriptions, schedule appointments, and more.    How Do I Sign Up?    1. In your internet browser, go to [email or web address]  2. Click on the First Time User? Click Here link in the Sign In box. You will be redirect to the New Member Sign Up page.  3. Enter your MyChart Access Code exactly as it appears below. You will not need to use this code after you???ve completed the sign-up process. If you do not sign up before the expiration date, you must request a new code.    MyChart Access Code: Not generated  Current MyChart Status: Active (This is the date your MyChart access code will expire)    4. Enter the last four digits of your Social Security Number (xxxx) and Date of Birth (mm/dd/yyyy) as indicated and click Submit. You will be taken to the next sign-up page.  5. Create a MyChart ID. This will be your MyChart login ID and cannot be changed, so think of one that is secure and easy to remember.  6. Create a MyChart password. You can change your password at any time.  7. Enter your Password Reset Question and Answer. This can be used at a later time if you forget your password.   8. Enter your e-mail address. You will receive e-mail notification when new information is available in MyChart.  9. Click Sign Up. You can now view and download portions of your medical record.  10. Click the Download Summary menu link to download a portable copy of your medical information.    Additional Information    If you have questions, please visit the Frequently Asked Questions section of the MyChart website at [email or web address] Remember, MyChart is NOT to be used for urgent needs. For medical emergencies, dial 911.       Results for orders placed in visit on 11/10/11   AMB POC URINALYSIS DIP STICK AUTO W/O MICRO       Component Value Range    Color Light Yellow  (none)    Clarity Clear  (none)    Glucose Negative  (none)    Bilirubin Negative  (none)    Ketones Negative  (none)    Spec.Grav. 1.005  1.001 - 1.035    Blood Negative  (none)    pH 6.0  4.6 - 8.0    Protein neg  Negative mg/dL    Urobilinogen 0.2 mg/dL  0.2 - 1    Nitrites Negative  (none)    Leukocyte esterase Negative  (none)   AMB POC HEMOGLOBIN A1C       Component Value Range    Hemoglobin A1C 7.5 (*) 4.8 - 5.6 %   AMB POC COMPLETE CBC,AUTOMATED ENTER       Component Value Range    WBC COUNT 9.6  4.5 - 10.5 K/uL    RBC 4.23  4.00 - 6.00 M/uL    HGB 12.6  12.0 - 16.0 g/dL    HCT 16.1  09.6 - 04.5 %    MCV 91.3  80.0 - 99.9 fL    MCH 29.7  27.0 - 31.0 pg    MCHC 32.5 (*) 33.0 - 37.0 g/dL    PLATELET 409  811 - 450 K/uL  LYMPHOCYTE % 31.7  20.5 - 51.1 %    LYMPHOCYTE # 3.0  1.2 - 3.4 K/uL

## 2011-11-10 NOTE — Progress Notes (Signed)
Kelsey [name] is a 60 y.o. female and presents with   Chief Complaint   Patient presents with   ??? Back Pain   .  Started having sudden  Back pain- Saturday, took hydrocodone- did not help.  No blood in urine, has taken one hydrocodone in the last 24 hours. No change in appetite, no nausea, vomiting, diarrhea.  Has had some constipation and urinary hesitancy.  Back hurts with moving around, not with eating.  Pt also would like referral to Dr. Clovis Riley      Current Outpatient Prescriptions   Medication Sig Dispense Refill   ??? levofloxacin (LEVAQUIN) 500 mg tablet Take 1 Tab by mouth daily for 10 days.  10 Tab  0   ??? pantoprazole (PROTONIX) 40 mg tablet TAKE 1 TABLET BY MOUTH EVERY DAY  30 Tab  1   ??? HYDROcodone-acetaminophen (NORCO) 5-325 mg per tablet Take 1 Tab by mouth every four (4) hours as needed.         ??? metFORMIN ER (GLUCOPHAGE XR) 500 mg tablet TAKE 5 TABLETS BY MOUTH DAILY  450 Tab  1   ??? CYMBALTA 60 mg capsule TAKE ONE CAPSULE BY MOUTH EVERY DAY  90 Cap  2   ??? NITROSTAT 0.4 mg SL tablet TAKE 1 TABLET BY MOUTH UNDER THE TONGUE EVERY 5 MINUTES AS NEEDED FOR CHEST PAIN AS DIRECTED  50 Tab  1   ??? methocarbamol (ROBAXIN) 500 mg tablet TAKE 2 TABLETS 4 TIMES A DAY  100 Tab  0   ??? furosemide (LASIX) 40 mg tablet Take 1 Tab by mouth daily.  30 Tab  11   ??? lisinopril (PRINIVIL, ZESTRIL) 5 mg tablet Take 1 Tab by mouth daily.  30 Tab  11   ??? prasugrel (EFFIENT) 10 mg tablet Take 1 Tab by mouth daily.  30 Tab  11   ??? aspirin delayed-release 81 mg tablet Take 1 Tab by mouth daily.  30 Tab  11   ??? Cholecalciferol, Vitamin D3, (VITAMIN D3) 1,000 unit Cap Take 5,000 Caps by mouth daily.         ??? chlorpheniramine-HYDROcodone (TUSSIONEX) 8-10 mg/5 mL suspension Take 5 mL by mouth every twelve (12) hours as needed for Cough.  120 mL  1   ??? traMADol (ULTRAM) 50 mg tablet Take 1 Tab by mouth every six (6) hours as needed.  30 Tab  1    ??? ergocalciferol (ERGOCALCIFEROL) 50,000 unit capsule Take 1 Cap by mouth two (2) days a week.  8 Cap  0   ??? nebivolol (BYSTOLIC) 5 mg tablet Take 10 mg by mouth daily.       ??? omega-3 fatty acids-vitamin e (FISH OIL) 1,000 mg Cap Take 1,200 mg by mouth two (2) times a day.       ??? NITROGLYCERIN (NITROTAB SL) 0.4 mg by SubLINGual route.         No current facility-administered medications for this visit.     Facility-Administered Medications Ordered in Other Visits   Medication Dose Route Frequency Provider Last Rate Last Dose   ??? sodium chloride 0.9 % bolus infusion 100 mL  100 mL IntraVENous RAD ONCE Julieanne Cotton, MD   100 mL at 11/10/11 1353   ??? ioversol (OPTIRAY) 350 mg iodine/mL contrast solution 100 mL  100 mL IntraVENous RAD ONCE Julieanne Cotton, MD   100 mL at 11/10/11 1353   ??? sodium chloride (NS) flush 10 mL  10 mL IntraVENous RAD ONCE  Julieanne Cotton, MD   10 mL at 11/10/11 1354     Allergies   Allergen Reactions   ??? Avalide (Irbesartan-Hydrochlorothiazide) Myalgia   ??? Demerol (Meperidine) Unknown (comments)   ??? Pcn (Penicillins) Swelling   ??? Statins-Hmg-Coa Reductase Inhibitors Myalgia   ??? Sulfa(Sulfonamide Antibiotics) Unknown (comments)   ??? Tricor (Fenofibrate Micronized) Myalgia     Past Medical History   Diagnosis Date   ??? Hypertension    ??? CAD (coronary artery disease)    ??? Chronic pain    ??? Arthritis    ??? COPD    ??? Diabetes      Past Surgical History   Procedure Date   ??? Hx orthopaedic      rt knee   ??? Pr cardiac surg procedure unlist      PTCA   ??? Hx other surgical      ganglion cust removed R     Family History   Problem Relation Age of Onset   ??? Lung Disease Mother    ??? Stroke Father      History   Substance Use Topics   ??? Smoking status: Current Some Day Smoker -- 0.3 packs/day for 45 years     Types: Cigarettes   ??? Smokeless tobacco: Never Used    Comment: passed ready   ??? Alcohol Use: No      may be 3 times a year if that          Objective:  BP 90/60   Wt 216 lb 8 oz (98.204 kg)  bwdwn 60 yo wf-   In NAD. A&O.  HEENT -- Pupils round.  O/P Clear.  Neck -- Supple. No JVD.  Heart -- RRR. 2/6 asm  Lungs -- Coarse breath sounds  Abdomen -- Soft. Left lower quadrant tenderness. Non-distended. No masses. Bowel sounds present.left cva tenderness, most tender at left SI joint  Extremities -- No edema.   slrt pos on left,   Results for orders placed in visit on 11/10/11   AMB POC URINALYSIS DIP STICK AUTO W/O MICRO       Component Value Range    Color Light Yellow  (none)    Clarity Clear  (none)    Glucose Negative  (none)    Bilirubin Negative  (none)    Ketones Negative  (none)    Spec.Grav. 1.005  1.001 - 1.035    Blood Negative  (none)    pH 6.0  4.6 - 8.0    Protein neg  Negative mg/dL    Urobilinogen 0.2 mg/dL  0.2 - 1    Nitrites Negative  (none)    Leukocyte esterase Negative  (none)   AMB POC HEMOGLOBIN A1C       Component Value Range    Hemoglobin A1C 7.5 (*) 4.8 - 5.6 %   AMB POC COMPLETE CBC,AUTOMATED ENTER       Component Value Range    WBC COUNT 9.6  4.5 - 10.5 K/uL    RBC 4.23  4.00 - 6.00 M/uL    HGB 12.6  12.0 - 16.0 g/dL    HCT 16.1  09.6 - 04.5 %    MCV 91.3  80.0 - 99.9 fL    MCH 29.7  27.0 - 31.0 pg    MCHC 32.5 (*) 33.0 - 37.0 g/dL    PLATELET 409  811 - 450 K/uL    LYMPHOCYTE % 31.7  20.5 - 51.1 %    LYMPHOCYTE # 3.0  1.2 - 3.4 K/uL       Assessment/Plan:  1. Abdominal  pain, other specified site  CT ABD PELV W WO CONT, levofloxacin (LEVAQUIN) 500 mg tablet, REFERRAL TO GASTROENTEROLOGY, PR COLLECTION VENOUS BLOOD,VENIPUNCTURE   2. Urinary tract infection, site not specified  AMB POC URINALYSIS DIP STICK AUTO W/O MICRO   3. Back pain  AMB POC HEMOGLOBIN A1C, AMB POC COMPLETE CBC,AUTOMATED ENTER, METABOLIC PANEL, COMPREHENSIVE           Author:  Lake Bells, MD 11/10/2011 10:05 AM

## 2011-11-11 LAB — METABOLIC PANEL, COMPREHENSIVE
A-G Ratio: 2 (ref 1.1–2.5)
ALT (SGPT): 26 IU/L (ref 0–32)
AST (SGOT): 23 IU/L (ref 0–40)
Albumin: 4.3 g/dL (ref 3.6–4.8)
Alk. phosphatase: 68 IU/L (ref 25–165)
BUN/Creatinine ratio: 21 (ref 11–26)
BUN: 19 mg/dL (ref 8–27)
Bilirubin, total: 0.2 mg/dL (ref 0.0–1.2)
CO2: 27 mmol/L (ref 20–32)
Calcium: 9.8 mg/dL (ref 8.6–10.2)
Chloride: 94 mmol/L — ABNORMAL LOW (ref 97–108)
Creatinine: 0.89 mg/dL (ref 0.57–1.00)
GFR est non-AA: 71 mL/min/{1.73_m2} (ref >59)
GLOBULIN, TOTAL: 2.2 g/dL (ref 1.5–4.5)
Glucose: 173 mg/dL — ABNORMAL HIGH (ref 65–99)
Potassium: 5.1 mmol/L (ref 3.5–5.2)
Protein, total: 6.5 g/dL (ref 6.0–8.5)
Sodium: 135 mmol/L (ref 134–144)
eGFR If African American: 81 mL/min/{1.73_m2} (ref >59)

## 2011-11-11 NOTE — Progress Notes (Signed)
Quick Note:    Called patient and relayed doctor's message  ______

## 2011-11-11 NOTE — Progress Notes (Signed)
Quick Note:    pls call- labs ok, ct neg, If not better, see ortho.  ______

## 2012-01-02 MED ORDER — CHANTIX CONTINUING MONTH PAK 1 MG TABLET
1 mg | ORAL_TABLET | ORAL | Status: DC
Start: 2012-01-02 — End: 2012-06-28

## 2012-01-20 LAB — AMB POC COMPLETE CBC,AUTOMATED ENTER
ABS. LYMPHS (POC): 1.9 10*3/uL (ref 1.2–3.4)
HCT (POC): 32.8 % — AB (ref 35.0–60.0)
HGB (POC): 10.1 g/dL — AB (ref 12.0–16.0)
LYMPHOCYTES (POC): 19.7 % — AB (ref 20.5–51.1)
MCH (POC): 27 pg (ref 27.0–31.0)
MCHC (POC): 30.7 g/dL — AB (ref 33.0–37.0)
MCV (POC): 87.9 fL (ref 80.0–99.9)
PLATELET (POC): 262 10*3/uL (ref 150–450)
RBC (POC): 3.74 M/uL — AB (ref 4.00–6.00)
WBC (POC): 9.7 10*3/uL (ref 4.5–10.5)

## 2012-01-20 LAB — AMB POC RAPID INFLUENZA TEST
Influenza A Ag POC: NEGATIVE Pos/Neg
Influenza B Ag POC: NEGATIVE Pos/Neg

## 2012-01-20 MED ORDER — IPRATROPIUM-ALBUTEROL 2.5 MG-0.5 MG/3 ML NEB SOLUTION
2.5 mg-0.5 mg/3 ml | PACK | Freq: Four times a day (QID) | RESPIRATORY_TRACT | Status: DC
Start: 2012-01-20 — End: 2012-12-01

## 2012-01-20 MED ORDER — HYDROCODONE 10 MG-CHLORPHENIRAMINE 8 MG/5 ML ORAL SUSP EXTEND.REL 12HR
10-8 mg/5 mL | Freq: Two times a day (BID) | ORAL | Status: DC | PRN
Start: 2012-01-20 — End: 2012-07-13

## 2012-01-20 MED ORDER — LEVOFLOXACIN 750 MG TAB
750 mg | ORAL_TABLET | Freq: Every day | ORAL | Status: DC
Start: 2012-01-20 — End: 2012-01-23

## 2012-01-20 NOTE — Progress Notes (Signed)
Kelsey [name] is a 61 y.o. female and presents with   Chief Complaint   Patient presents with   ??? Cold     Several days  running fever off and on   .  Sunday am started coughing- fever up to 101- . Has taken mucinex and alka seltzer cold plus.feels sob, cough prod yellow phlegm.  Tried duonebs and they did not seem to be helping.      Current Outpatient Prescriptions   Medication Sig Dispense Refill   ??? albuterol-ipratropium (DUO-NEB) 2.5 mg-0.5 mg/3 ml nebulizer solution 3 mL by Nebulization route four (4) times daily.  1 Package  3   ??? chlorpheniramine-HYDROcodone (TUSSIONEX) 8-10 mg/5 mL suspension Take 5 mL by mouth every twelve (12) hours as needed for Cough.  120 mL  1   ??? levofloxacin (LEVAQUIN) 750 mg tablet Take 1 Tab by mouth daily for 10 days.  5 Tab  0   ??? CHANTIX CONTINUING MONTH PAK 1 mg tablet USE AS DIRECTED  56 Tab  4   ??? pantoprazole (PROTONIX) 40 mg tablet TAKE 1 TABLET BY MOUTH EVERY DAY  30 Tab  1   ??? HYDROcodone-acetaminophen (NORCO) 5-325 mg per tablet Take 1 Tab by mouth every four (4) hours as needed.         ??? metFORMIN ER (GLUCOPHAGE XR) 500 mg tablet TAKE 5 TABLETS BY MOUTH DAILY  450 Tab  1   ??? CYMBALTA 60 mg capsule TAKE ONE CAPSULE BY MOUTH EVERY DAY  90 Cap  2   ??? NITROSTAT 0.4 mg SL tablet TAKE 1 TABLET BY MOUTH UNDER THE TONGUE EVERY 5 MINUTES AS NEEDED FOR CHEST PAIN AS DIRECTED  50 Tab  1   ??? methocarbamol (ROBAXIN) 500 mg tablet TAKE 2 TABLETS 4 TIMES A DAY  100 Tab  0   ??? furosemide (LASIX) 40 mg tablet Take 1 Tab by mouth daily.  30 Tab  11   ??? lisinopril (PRINIVIL, ZESTRIL) 5 mg tablet Take 1 Tab by mouth daily.  30 Tab  11   ??? prasugrel (EFFIENT) 10 mg tablet Take 1 Tab by mouth daily.  30 Tab  11   ??? aspirin delayed-release 81 mg tablet Take 1 Tab by mouth daily.  30 Tab  11   ??? Cholecalciferol, Vitamin D3, (VITAMIN D3) 1,000 unit Cap Take 5,000 Caps by mouth daily.         ??? traMADol (ULTRAM) 50 mg tablet Take 1 Tab by mouth every six (6) hours as needed.  30 Tab  1   ???  ergocalciferol (ERGOCALCIFEROL) 50,000 unit capsule Take 1 Cap by mouth two (2) days a week.  8 Cap  0   ??? nebivolol (BYSTOLIC) 5 mg tablet Take 10 mg by mouth daily.       ??? omega-3 fatty acids-vitamin e (FISH OIL) 1,000 mg Cap Take 1,200 mg by mouth two (2) times a day.       ??? NITROGLYCERIN (NITROTAB SL) 0.4 mg by SubLINGual route.         Allergies   Allergen Reactions   ??? Avalide (Irbesartan-Hydrochlorothiazide) Myalgia   ??? Demerol (Meperidine) Unknown (comments)   ??? Pcn (Penicillins) Swelling   ??? Statins-Hmg-Coa Reductase Inhibitors Myalgia   ??? Sulfa(Sulfonamide Antibiotics) Unknown (comments)   ??? Tricor (Fenofibrate Micronized) Myalgia     Past Medical History   Diagnosis Date   ??? Hypertension    ??? CAD (coronary artery disease)    ??? Chronic  pain    ??? Arthritis    ??? COPD    ??? Diabetes      Past Surgical History   Procedure Date   ??? Hx orthopaedic      rt knee   ??? Pr cardiac surg procedure unlist      PTCA   ??? Hx other surgical      ganglion cust removed R     Family History   Problem Relation Age of Onset   ??? Lung Disease Mother    ??? Stroke Father      History   Substance Use Topics   ??? Smoking status: Current Some Day Smoker -- 0.3 packs/day for 45 years     Types: Cigarettes   ??? Smokeless tobacco: Never Used    Comment: passed ready   ??? Alcohol Use: No      may be 3 times a year if that          Objective:  BP 130/64   Temp 98.8 ??F (37.1 ??C)   Ht 5\' 5"  (1.651 m)   Wt 220 lb (99.791 kg)   BMI 36.61 kg/m2   SpO2 90%  wdwn 61 yo wf  In NAD. A&O.  HEENT -- Pupils round.  O/P Clear.edematous uvula  Neck -- Supple. No JVD. Tm- intact  Heart -- RRR. 2/6 asm  Lungs --poor breath sounds  Abdomen -- Soft. Non-tender. Non-distended. No masses. Bowel sounds present.  Extremities -- No edema.     Results for orders placed in visit on 01/20/12   AMB POC COMPLETE CBC,AUTOMATED ENTER       Component Value Range    WBC COUNT 9.7  4.5 - 10.5 K/uL    RBC 3.74 (*) 4.00 - 6.00 M/uL    HGB 10.1 (*) 12.0 - 16.0 g/dL    HCT 16.1  (*) 09.6 - 60.0 %    MCV 87.9  80.0 - 99.9 fL    MCH 27.0  27.0 - 31.0 pg    MCHC 30.7 (*) 33.0 - 37.0 g/dL    PLATELET 045  409 - 450 K/uL    LYMPHOCYTE % 19.7 (*) 20.5 - 51.1 %    LYMPHOCYTE # 1.9  1.2 - 3.4 K/uL   AMB POC RAPID INFLUENZA TEST       Component Value Range    Influenza A Ag POC Negative      Influenza B Ag POC Negative            Assessment/Plan:  1. Cough  XR CHEST PA LAT, AMB POC COMPLETE CBC,AUTOMATED ENTER, AMB POC RAPID INFLUENZA TEST, AMB POC RAPID INFLUENZA TEST, albuterol-ipratropium (DUO-NEB) 2.5 mg-0.5 mg/3 ml nebulizer solution, chlorpheniramine-HYDROcodone (TUSSIONEX) 8-10 mg/5 mL suspension, levofloxacin (LEVAQUIN) 750 mg tablet     Recheck hgb when feeling better.      Author:  Lake Bells, MD 01/20/2012 3:19 PM

## 2012-01-20 NOTE — Patient Instructions (Addendum)
MyChart Activation    Thank you for requesting access to MyChart. Please follow the instructions below to securely access and download your online medical record. MyChart allows you to send messages to your doctor, view your test results, renew your prescriptions, schedule appointments, and more.    How Do I Sign Up?    1. In your internet browser, go to [email or web address]  2. Click on the First Time User? Click Here link in the Sign In box. You will be redirect to the New Member Sign Up page.  3. Enter your MyChart Access Code exactly as it appears below. You will not need to use this code after you???ve completed the sign-up process. If you do not sign up before the expiration date, you must request a new code.    MyChart Access Code: Not generated  Current MyChart Status: Active (This is the date your MyChart access code will expire)    4. Enter the last four digits of your Social Security Number (xxxx) and Date of Birth (mm/dd/yyyy) as indicated and click Submit. You will be taken to the next sign-up page.  5. Create a MyChart ID. This will be your MyChart login ID and cannot be changed, so think of one that is secure and easy to remember.  6. Create a MyChart password. You can change your password at any time.  7. Enter your Password Reset Question and Answer. This can be used at a later time if you forget your password.   8. Enter your e-mail address. You will receive e-mail notification when new information is available in MyChart.  9. Click Sign Up. You can now view and download portions of your medical record.  10. Click the Download Summary menu link to download a portable copy of your medical information.    Additional Information    If you have questions, please visit the Frequently Asked Questions section of the MyChart website at [email or web address] Remember, MyChart is NOT to be used for urgent needs. For medical emergencies, dial 911.      Results for orders placed in visit on  01/20/12   AMB POC COMPLETE CBC,AUTOMATED ENTER       Component Value Range    WBC COUNT 9.7  4.5 - 10.5 K/uL    RBC 3.74 (*) 4.00 - 6.00 M/uL    HGB 10.1 (*) 12.0 - 16.0 g/dL    HCT 16.1 (*) 09.6 - 60.0 %    MCV 87.9  80.0 - 99.9 fL    MCH 27.0  27.0 - 31.0 pg    MCHC 30.7 (*) 33.0 - 37.0 g/dL    PLATELET 045  409 - 450 K/uL    LYMPHOCYTE % 19.7 (*) 20.5 - 51.1 %    LYMPHOCYTE # 1.9  1.2 - 3.4 K/uL   AMB POC RAPID INFLUENZA TEST       Component Value Range    Influenza A Ag POC Negative      Influenza B Ag POC Negative

## 2012-01-23 MED ORDER — LEVOFLOXACIN 750 MG TAB
750 mg | ORAL_TABLET | ORAL | Status: DC
Start: 2012-01-23 — End: 2012-03-17

## 2012-01-25 MED ORDER — AZITHROMYCIN 250 MG TAB
250 mg | ORAL_TABLET | ORAL | Status: AC
Start: 2012-01-25 — End: 2012-01-30

## 2012-01-30 MED ORDER — PREDNISONE 20 MG TAB
20 mg | ORAL_TABLET | Freq: Every day | ORAL | Status: DC
Start: 2012-01-30 — End: 2012-03-17

## 2012-01-30 MED ORDER — GLIMEPIRIDE 2 MG TAB
2 mg | ORAL_TABLET | ORAL | Status: DC
Start: 2012-01-30 — End: 2012-03-17

## 2012-01-30 NOTE — Patient Instructions (Signed)
MyChart Activation    Thank you for requesting access to MyChart. Please follow the instructions below to securely access and download your online medical record. MyChart allows you to send messages to your doctor, view your test results, renew your prescriptions, schedule appointments, and more.    How Do I Sign Up?    1. In your internet browser, go to [email or web address]  2. Click on the First Time User? Click Here link in the Sign In box. You will be redirect to the New Member Sign Up page.  3. Enter your MyChart Access Code exactly as it appears below. You will not need to use this code after you???ve completed the sign-up process. If you do not sign up before the expiration date, you must request a new code.    MyChart Access Code: Not generated  Current MyChart Status: Active (This is the date your MyChart access code will expire)    4. Enter the last four digits of your Social Security Number (xxxx) and Date of Birth (mm/dd/yyyy) as indicated and click Submit. You will be taken to the next sign-up page.  5. Create a MyChart ID. This will be your MyChart login ID and cannot be changed, so think of one that is secure and easy to remember.  6. Create a MyChart password. You can change your password at any time.  7. Enter your Password Reset Question and Answer. This can be used at a later time if you forget your password.   8. Enter your e-mail address. You will receive e-mail notification when new information is available in MyChart.  9. Click Sign Up. You can now view and download portions of your medical record.  10. Click the Download Summary menu link to download a portable copy of your medical information.    Additional Information    If you have questions, please visit the Frequently Asked Questions section of the MyChart website at [email or web address] Remember, MyChart is NOT to be used for urgent needs. For medical emergencies, dial 911.      Take allegra otc, continue mucinex  and take prednisone taper,

## 2012-01-30 NOTE — Progress Notes (Signed)
Kelsey [name] is a 61 y.o. female and presents with   Chief Complaint   Patient presents with   ??? Follow-up     URI   .  Feels better, but not well.  Had hallucinations with levaquin.  - just took it for 4 days.  Over last weekend- Dr. On call gave her zpack.  Finished that yesterday.  No fever, still has congestion, fatigue.  Cough prod clear  Blood sugars running 110 in am and 200 in afternoon. Still wheezing at night.`  She is going to [facility] and wants to feel better.    Current Outpatient Prescriptions   Medication Sig Dispense Refill   ??? azithromycin (ZITHROMAX) 250 mg tablet Take 2 tablets today, then take 1 tablet daily.  6 Tab  0   ??? levofloxacin (LEVAQUIN) 750 mg tablet TAKE 1 TAB BY MOUTH DAILY FOR 10 DAYS.  5 Tab  0   ??? albuterol-ipratropium (DUO-NEB) 2.5 mg-0.5 mg/3 ml nebulizer solution 3 mL by Nebulization route four (4) times daily.  1 Package  3   ??? chlorpheniramine-HYDROcodone (TUSSIONEX) 8-10 mg/5 mL suspension Take 5 mL by mouth every twelve (12) hours as needed for Cough.  120 mL  1   ??? CHANTIX CONTINUING MONTH PAK 1 mg tablet USE AS DIRECTED  56 Tab  4   ??? pantoprazole (PROTONIX) 40 mg tablet TAKE 1 TABLET BY MOUTH EVERY DAY  30 Tab  1   ??? HYDROcodone-acetaminophen (NORCO) 5-325 mg per tablet Take 1 Tab by mouth every four (4) hours as needed.         ??? metFORMIN ER (GLUCOPHAGE XR) 500 mg tablet TAKE 5 TABLETS BY MOUTH DAILY  450 Tab  1   ??? CYMBALTA 60 mg capsule TAKE ONE CAPSULE BY MOUTH EVERY DAY  90 Cap  2   ??? NITROSTAT 0.4 mg SL tablet TAKE 1 TABLET BY MOUTH UNDER THE TONGUE EVERY 5 MINUTES AS NEEDED FOR CHEST PAIN AS DIRECTED  50 Tab  1   ??? methocarbamol (ROBAXIN) 500 mg tablet TAKE 2 TABLETS 4 TIMES A DAY  100 Tab  0   ??? furosemide (LASIX) 40 mg tablet Take 1 Tab by mouth daily.  30 Tab  11   ??? lisinopril (PRINIVIL, ZESTRIL) 5 mg tablet Take 1 Tab by mouth daily.  30 Tab  11   ??? prasugrel (EFFIENT) 10 mg tablet Take 1 Tab by mouth daily.  30 Tab  11   ??? aspirin delayed-release 81 mg  tablet Take 1 Tab by mouth daily.  30 Tab  11   ??? Cholecalciferol, Vitamin D3, (VITAMIN D3) 1,000 unit Cap Take 5,000 Caps by mouth daily.         ??? traMADol (ULTRAM) 50 mg tablet Take 1 Tab by mouth every six (6) hours as needed.  30 Tab  1   ??? ergocalciferol (ERGOCALCIFEROL) 50,000 unit capsule Take 1 Cap by mouth two (2) days a week.  8 Cap  0   ??? nebivolol (BYSTOLIC) 5 mg tablet Take 10 mg by mouth daily.       ??? omega-3 fatty acids-vitamin e (FISH OIL) 1,000 mg Cap Take 1,200 mg by mouth two (2) times a day.       ??? NITROGLYCERIN (NITROTAB SL) 0.4 mg by SubLINGual route.         Allergies   Allergen Reactions   ??? Avalide (Irbesartan-Hydrochlorothiazide) Myalgia   ??? Demerol (Meperidine) Unknown (comments)   ??? Pcn (Penicillins) Swelling   ???  Statins-Hmg-Coa Reductase Inhibitors Myalgia   ??? Sulfa(Sulfonamide Antibiotics) Unknown (comments)   ??? Tricor (Fenofibrate Micronized) Myalgia     Past Medical History   Diagnosis Date   ??? Hypertension    ??? CAD (coronary artery disease)    ??? Chronic pain    ??? Arthritis    ??? COPD    ??? Diabetes      Past Surgical History   Procedure Date   ??? Hx orthopaedic      rt knee   ??? Pr cardiac surg procedure unlist      PTCA   ??? Hx other surgical      ganglion cust removed R     Family History   Problem Relation Age of Onset   ??? Lung Disease Mother    ??? Stroke Father      History   Substance Use Topics   ??? Smoking status: Current Some Day Smoker -- 0.3 packs/day for 45 years     Types: Cigarettes   ??? Smokeless tobacco: Never Used    Comment: passed ready   ??? Alcohol Use: No      may be 3 times a year if that          Objective:  Ht 5\' 5"  (1.651 m)   Wt 220 lb (99.791 kg)   BMI 36.61 kg/m2  wdwn 61 yo wf-   In NAD. A&O.  HEENT -- Pupils round.  O/P Clear.tm- intact  Neck -- Supple. No JVD.  Heart -- RRR. No R/M/G.  Lungs -- Coarse breath sounds and rhonchi  Abdomen -- Soft. Non-tender. Non-distended. No masses. Bowel sounds present.  Extremities -- No edema.        Assessment/Plan:  1.  Diabetes mellitus    2. Cough      Start allegra otc, take prednisone taper, and  Continue nasal saline      Author:  Lake Bells, MD 01/30/2012 3:29 PM

## 2012-02-02 NOTE — Telephone Encounter (Signed)
Prednisone 20mg

## 2012-02-02 NOTE — Telephone Encounter (Signed)
What reaction?

## 2012-02-02 NOTE — Telephone Encounter (Signed)
Sluggish, dizzy  And turned yellow,  It is the amaryl 2 mg to get blood sugars down with the prednisone.  DR. Cherly Hensen advised she will have to take insulin while on prednisone, patient will come in tomorrow

## 2012-02-02 NOTE — Telephone Encounter (Signed)
What reaction and what med?

## 2012-02-02 NOTE — Telephone Encounter (Signed)
Please advise

## 2012-02-02 NOTE — Telephone Encounter (Signed)
Pt is calling because she is having and allergic reaction to one of the meds she was given on Friday

## 2012-02-03 NOTE — Patient Instructions (Signed)
MyChart Activation    Thank you for requesting access to MyChart. Please follow the instructions below to securely access and download your online medical record. MyChart allows you to send messages to your doctor, view your test results, renew your prescriptions, schedule appointments, and more.    How Do I Sign Up?    1. In your internet browser, go to [email or web address]  2. Click on the First Time User? Click Here link in the Sign In box. You will be redirect to the New Member Sign Up page.  3. Enter your MyChart Access Code exactly as it appears below. You will not need to use this code after you???ve completed the sign-up process. If you do not sign up before the expiration date, you must request a new code.    MyChart Access Code: Not generated  Current MyChart Status: Active (This is the date your MyChart access code will expire)    4. Enter the last four digits of your Social Security Number (xxxx) and Date of Birth (mm/dd/yyyy) as indicated and click Submit. You will be taken to the next sign-up page.  5. Create a MyChart ID. This will be your MyChart login ID and cannot be changed, so think of one that is secure and easy to remember.  6. Create a MyChart password. You can change your password at any time.  7. Enter your Password Reset Question and Answer. This can be used at a later time if you forget your password.   8. Enter your e-mail address. You will receive e-mail notification when new information is available in MyChart.  9. Click Sign Up. You can now view and download portions of your medical record.  10. Click the Download Summary menu link to download a portable copy of your medical information.    Additional Information    If you have questions, please visit the Frequently Asked Questions section of the MyChart website at [email or web address] Remember, MyChart is NOT to be used for urgent needs. For medical emergencies, dial 911.      Take sliding scale insulin- for blood  sugar 120-180  Take 2 units   181-250  Take 4 units  251- 300  Take 6 units  301 to 350 take 8 units  351- to 400 take 10 units.

## 2012-02-03 NOTE — Progress Notes (Signed)
Kelsey [name] is a 61 y.o. female and presents with   Chief Complaint   Patient presents with   ??? Other     start insulin   .  Pt had rxn to amaryl- made her dizzy and skin turned yellow?  bs running in 200's - but she feels better on prednisone      Current Outpatient Prescriptions   Medication Sig Dispense Refill   ??? predniSONE (DELTASONE) 20 mg tablet Take 1 Tab by mouth daily (with breakfast). Take two daily for 3 days, then one daily for 3 days, then 1/2 for 3 days  30 Tab  0   ??? glimepiride (AMARYL) 2 mg tablet Take 1 Tab by mouth every morning.  30 Tab  1   ??? levofloxacin (LEVAQUIN) 750 mg tablet TAKE 1 TAB BY MOUTH DAILY FOR 10 DAYS.  5 Tab  0   ??? albuterol-ipratropium (DUO-NEB) 2.5 mg-0.5 mg/3 ml nebulizer solution 3 mL by Nebulization route four (4) times daily.  1 Package  3   ??? chlorpheniramine-HYDROcodone (TUSSIONEX) 8-10 mg/5 mL suspension Take 5 mL by mouth every twelve (12) hours as needed for Cough.  120 mL  1   ??? CHANTIX CONTINUING MONTH PAK 1 mg tablet USE AS DIRECTED  56 Tab  4   ??? pantoprazole (PROTONIX) 40 mg tablet TAKE 1 TABLET BY MOUTH EVERY DAY  30 Tab  1   ??? HYDROcodone-acetaminophen (NORCO) 5-325 mg per tablet Take 1 Tab by mouth every four (4) hours as needed.         ??? metFORMIN ER (GLUCOPHAGE XR) 500 mg tablet TAKE 5 TABLETS BY MOUTH DAILY  450 Tab  1   ??? CYMBALTA 60 mg capsule TAKE ONE CAPSULE BY MOUTH EVERY DAY  90 Cap  2   ??? NITROSTAT 0.4 mg SL tablet TAKE 1 TABLET BY MOUTH UNDER THE TONGUE EVERY 5 MINUTES AS NEEDED FOR CHEST PAIN AS DIRECTED  50 Tab  1   ??? methocarbamol (ROBAXIN) 500 mg tablet TAKE 2 TABLETS 4 TIMES A DAY  100 Tab  0   ??? furosemide (LASIX) 40 mg tablet Take 1 Tab by mouth daily.  30 Tab  11   ??? lisinopril (PRINIVIL, ZESTRIL) 5 mg tablet Take 1 Tab by mouth daily.  30 Tab  11   ??? prasugrel (EFFIENT) 10 mg tablet Take 1 Tab by mouth daily.  30 Tab  11   ??? aspirin delayed-release 81 mg tablet Take 1 Tab by mouth daily.  30 Tab  11   ??? Cholecalciferol, Vitamin D3,  (VITAMIN D3) 1,000 unit Cap Take 5,000 Caps by mouth daily.         ??? traMADol (ULTRAM) 50 mg tablet Take 1 Tab by mouth every six (6) hours as needed.  30 Tab  1   ??? ergocalciferol (ERGOCALCIFEROL) 50,000 unit capsule Take 1 Cap by mouth two (2) days a week.  8 Cap  0   ??? nebivolol (BYSTOLIC) 5 mg tablet Take 10 mg by mouth daily.       ??? omega-3 fatty acids-vitamin e (FISH OIL) 1,000 mg Cap Take 1,200 mg by mouth two (2) times a day.       ??? NITROGLYCERIN (NITROTAB SL) 0.4 mg by SubLINGual route.         Allergies   Allergen Reactions   ??? Avalide (Irbesartan-Hydrochlorothiazide) Myalgia   ??? Demerol (Meperidine) Unknown (comments)   ??? Pcn (Penicillins) Swelling   ??? Statins-Hmg-Coa Reductase Inhibitors  Myalgia   ??? Sulfa(Sulfonamide Antibiotics) Unknown (comments)   ??? Tricor (Fenofibrate Micronized) Myalgia     Past Medical History   Diagnosis Date   ??? Hypertension    ??? CAD (coronary artery disease)    ??? Chronic pain    ??? Arthritis    ??? COPD    ??? Diabetes      Past Surgical History   Procedure Date   ??? Hx orthopaedic      rt knee   ??? Pr cardiac surg procedure unlist      PTCA   ??? Hx other surgical      ganglion cust removed R     Family History   Problem Relation Age of Onset   ??? Lung Disease Mother    ??? Stroke Father      History   Substance Use Topics   ??? Smoking status: Current Some Day Smoker -- 0.3 packs/day for 45 years     Types: Cigarettes   ??? Smokeless tobacco: Never Used    Comment: passed ready   ??? Alcohol Use: No      may be 3 times a year if that          Objective:  BP 110/60   Ht 5\' 6"  (1.676 m)   Wt 216 lb (97.977 kg)   BMI 34.86 kg/m2  wdwn 61 yo wf-  In NAD. A&O.  HEENT -- Pupils round.  O/P Clear.  Neck -- Supple. No JVD.  Heart -- RRR. No R/M/G.  Lungs -- CTA.  Abdomen -- Soft. Non-tender. Non-distended. No masses. Bowel sounds present.  Extremities -- No edema.        Assessment/Plan:  1. DM w/o complication type II      See sliding scale insulin instructions- given to patient.  She is given  humalog pen and will call if bs <60 or > 400.      Author:  Lake Bells, MD 02/03/2012 4:01 PM

## 2012-03-17 LAB — AMB POC URINALYSIS DIP STICK MANUAL W/O MICRO
Bilirubin (UA POC): NEGATIVE
Blood (UA POC): NEGATIVE
Glucose (UA POC): NEGATIVE
Ketones (UA POC): NEGATIVE
Nitrites (UA POC): NEGATIVE
Protein (UA POC): NEGATIVE mg/dL
Specific gravity (UA POC): 1.02 (ref 1.001–1.035)
Urobilinogen (UA POC): 0.2 (ref 0.2–1)
pH (UA POC): 5.5 (ref 4.6–8.0)

## 2012-03-17 LAB — AMB POC COMPLETE CBC,AUTOMATED ENTER
ABS. LYMPHS (POC): 3.3 10*3/uL (ref 1.2–3.4)
HCT (POC): 32.4 % — AB (ref 35.0–60.0)
HGB (POC): 10.1 g/dL — AB (ref 12.0–16.0)
LYMPHOCYTES (POC): 32.9 % (ref 20.5–51.1)
MCH (POC): 26.1 pg — AB (ref 27.0–31.0)
MCHC (POC): 31.2 g/dL — AB (ref 33.0–37.0)
MCV (POC): 83.8 fL (ref 80.0–99.9)
PLATELET (POC): 298 10*3/uL (ref 150–450)
RBC (POC): 3.87 M/uL — AB (ref 4.00–6.00)
WBC (POC): 10.1 10*3/uL (ref 4.5–10.5)

## 2012-03-17 LAB — AMB POC HEMOGLOBIN A1C: Hemoglobin A1c (POC): 8 % — AB (ref 4.8–5.6)

## 2012-03-17 NOTE — Patient Instructions (Addendum)
MyChart Activation    Thank you for requesting access to MyChart. Please follow the instructions below to securely access and download your online medical record. MyChart allows you to send messages to your doctor, view your test results, renew your prescriptions, schedule appointments, and more.    How Do I Sign Up?    1. In your internet browser, go to [email or web address]  2. Click on the First Time User? Click Here link in the Sign In box. You will be redirect to the New Member Sign Up page.  3. Enter your MyChart Access Code exactly as it appears below. You will not need to use this code after you???ve completed the sign-up process. If you do not sign up before the expiration date, you must request a new code.    MyChart Access Code: Not generated  Current MyChart Status: Active (This is the date your MyChart access code will expire)    4. Enter the last four digits of your Social Security Number (xxxx) and Date of Birth (mm/dd/yyyy) as indicated and click Submit. You will be taken to the next sign-up page.  5. Create a MyChart ID. This will be your MyChart login ID and cannot be changed, so think of one that is secure and easy to remember.  6. Create a MyChart password. You can change your password at any time.  7. Enter your Password Reset Question and Answer. This can be used at a later time if you forget your password.   8. Enter your e-mail address. You will receive e-mail notification when new information is available in MyChart.  9. Click Sign Up. You can now view and download portions of your medical record.  10. Click the Download Summary menu link to download a portable copy of your medical information.    Additional Information    If you have questions, please visit the Frequently Asked Questions section of the MyChart website at [email or web address] Remember, MyChart is NOT to be used for urgent needs. For medical emergencies, dial 911.      Results for orders placed in visit on  03/17/12   AMB POC URINALYSIS DIP STICK MANUAL W/O MICRO       Component Value Range    Color Yellow  (none)    Clarity Clear  (none)    Glucose Negative  (none)    Bilirubin Negative  (none)    Ketones Negative  (none)    Spec.Grav. 1.020  1.001 - 1.035    Blood Negative  (none)    pH 5.5  4.6 - 8.0    Protein, Urine Negative  Negative mg/dL    Urobilinogen 0.2 mg/dL  0.2 - 1    Nitrites Negative  (none)    Leukocyte esterase Trace  (none)   AMB POC HEMOGLOBIN A1C       Component Value Range    Hemoglobin A1c (POC) 8.0 (*) 4.8 - 5.6 %   AMB POC COMPLETE CBC,AUTOMATED ENTER       Component Value Range    WBC (POC) 10.1  4.5 - 10.5 K/uL    RBC (POC) 3.87 (*) 4.00 - 6.00 M/uL    HGB (POC) 10.1 (*) 12.0 - 16.0 g/dL    HCT (POC) 16.1 (*) 09.6 - 60.0 %    MCV (POC) 83.8  80.0 - 99.9 fL    MCH (POC) 26.1 (*) 27.0 - 31.0 pg    MCHC (POC) 31.2 (*) 33.0 - 37.0 g/dL    PLATELET (POC) 045  150 - 450 K/uL    LYMPHOCYTES (POC) 32.9  20.5 - 51.1 %    ABS. LYMPHS (POC) 3.3  1.2 - 3.4 K/uL     Start 10 units levemir at bedtime, continue fast acting insulin with meals  Start symbicort twice a day 160/4.5

## 2012-03-17 NOTE — Progress Notes (Signed)
Kelsey [name] is a 61 y.o. female and presents with   Chief Complaint   Patient presents with   ??? Blood sugar problem     follow-up   .  Blood sugars before meals.[phone] . Stopped prednisone - and bs improved, but breathing has gotten worse. Has had chest tightness when she gets sob= bending over. Toes stay ice cold and painful      Current Outpatient Prescriptions   Medication Sig Dispense Refill   ??? albuterol-ipratropium (DUO-NEB) 2.5 mg-0.5 mg/3 ml nebulizer solution 3 mL by Nebulization route four (4) times daily.  1 Package  3   ??? chlorpheniramine-HYDROcodone (TUSSIONEX) 8-10 mg/5 mL suspension Take 5 mL by mouth every twelve (12) hours as needed for Cough.  120 mL  1   ??? CHANTIX CONTINUING MONTH PAK 1 mg tablet USE AS DIRECTED  56 Tab  4   ??? pantoprazole (PROTONIX) 40 mg tablet TAKE 1 TABLET BY MOUTH EVERY DAY  30 Tab  1   ??? HYDROcodone-acetaminophen (NORCO) 5-325 mg per tablet Take 1 Tab by mouth every four (4) hours as needed.         ??? metFORMIN ER (GLUCOPHAGE XR) 500 mg tablet TAKE 5 TABLETS BY MOUTH DAILY  450 Tab  1   ??? CYMBALTA 60 mg capsule TAKE ONE CAPSULE BY MOUTH EVERY DAY  90 Cap  2   ??? NITROSTAT 0.4 mg SL tablet TAKE 1 TABLET BY MOUTH UNDER THE TONGUE EVERY 5 MINUTES AS NEEDED FOR CHEST PAIN AS DIRECTED  50 Tab  1   ??? methocarbamol (ROBAXIN) 500 mg tablet TAKE 2 TABLETS 4 TIMES A DAY  100 Tab  0   ??? furosemide (LASIX) 40 mg tablet Take 1 Tab by mouth daily.  30 Tab  11   ??? lisinopril (PRINIVIL, ZESTRIL) 5 mg tablet Take 1 Tab by mouth daily.  30 Tab  11   ??? prasugrel (EFFIENT) 10 mg tablet Take 1 Tab by mouth daily.  30 Tab  11   ??? aspirin delayed-release 81 mg tablet Take 1 Tab by mouth daily.  30 Tab  11   ??? Cholecalciferol, Vitamin D3, (VITAMIN D3) 1,000 unit Cap Take 5,000 Caps by mouth daily.         ??? traMADol (ULTRAM) 50 mg tablet Take 1 Tab by mouth every six (6) hours as needed.  30 Tab  1   ??? ergocalciferol (ERGOCALCIFEROL) 50,000 unit capsule Take 1 Cap by mouth two (2) days a  week.  8 Cap  0   ??? nebivolol (BYSTOLIC) 5 mg tablet Take 10 mg by mouth daily.       ??? omega-3 fatty acids-vitamin e (FISH OIL) 1,000 mg Cap Take 1,200 mg by mouth two (2) times a day.       ??? NITROGLYCERIN (NITROTAB SL) 0.4 mg by SubLINGual route.         Allergies   Allergen Reactions   ??? Avalide (Irbesartan-Hydrochlorothiazide) Myalgia   ??? Demerol (Meperidine) Unknown (comments)   ??? Pcn (Penicillins) Swelling   ??? Statins-Hmg-Coa Reductase Inhibitors Myalgia   ??? Sulfa (Sulfonamide Antibiotics) Unknown (comments)   ??? Tricor (Fenofibrate Micronized) Myalgia     Past Medical History   Diagnosis Date   ??? Hypertension    ??? CAD (coronary artery disease)    ??? Chronic pain    ??? Arthritis    ??? COPD    ??? Diabetes      Past Surgical History   Procedure  Date   ??? Hx orthopaedic      rt knee   ??? Pr cardiac surg procedure unlist      PTCA   ??? Hx other surgical      ganglion cust removed R     Family History   Problem Relation Age of Onset   ??? Lung Disease Mother    ??? Stroke Father      History   Substance Use Topics   ??? Smoking status: Current Some Day Smoker -- 0.3 packs/day for 45 years     Types: Cigarettes   ??? Smokeless tobacco: Never Used    Comment: passed ready   ??? Alcohol Use: No      may be 3 times a year if that          Objective:  BP 110/58   Ht 5\' 6"  (1.676 m)   Wt 218 lb (98.884 kg)   BMI 35.19 kg/m2  wdwn 61 yo wf-  In NAD. A&O.  HEENT -- Pupils round.  O/P Clear.  Neck -- Supple. No JVD.  Heart -- RRR. 2/6 asm.    Lungs -- Coarse breath sounds  Abdomen -- Soft. Non-tender. Non-distended. No masses. Bowel sounds present.  Extremities -- No edema.good pulses     Results for orders placed in visit on 03/17/12   AMB POC URINALYSIS DIP STICK MANUAL W/O MICRO       Component Value Range    Color Yellow  (none)    Clarity Clear  (none)    Glucose Negative  (none)    Bilirubin Negative  (none)    Ketones Negative  (none)    Spec.Grav. 1.020  1.001 - 1.035    Blood Negative  (none)    pH 5.5  4.6 - 8.0    Protein, Urine  Negative  Negative mg/dL    Urobilinogen 0.2 mg/dL  0.2 - 1    Nitrites Negative  (none)    Leukocyte esterase Trace  (none)   AMB POC HEMOGLOBIN A1C       Component Value Range    Hemoglobin A1c (POC) 8.0 (*) 4.8 - 5.6 %   AMB POC COMPLETE CBC,AUTOMATED ENTER       Component Value Range    WBC (POC) 10.1  4.5 - 10.5 K/uL    RBC (POC) 3.87 (*) 4.00 - 6.00 M/uL    HGB (POC) 10.1 (*) 12.0 - 16.0 g/dL    HCT (POC) 30.8 (*) 65.7 - 60.0 %    MCV (POC) 83.8  80.0 - 99.9 fL    MCH (POC) 26.1 (*) 27.0 - 31.0 pg    MCHC (POC) 31.2 (*) 33.0 - 37.0 g/dL    PLATELET (POC) 846  150 - 450 K/uL    LYMPHOCYTES (POC) 32.9  20.5 - 51.1 %    ABS. LYMPHS (POC) 3.3  1.2 - 3.4 K/uL   '     Assessment/Plan:  1. Anemia  VITAMIN B12, IRON=  Had egd and colon with in the last year.   2. Coronary atherosclerosis of native coronary artery     3. Mixed hyperlipidemia     4. DM w/o complication type II  AMB POC URINALYSIS DIP STICK MANUAL W/O MICRO, AMB POC HEMOGLOBIN A1C, METABOLIC PANEL, COMPREHENSIVE   5. Dyspnea  AMB POC COMPLETE CBC,AUTOMATED ENTER, PR COLLECTION VENOUS BLOOD,VENIPUNCTURE     Start levemir  At bedtime- 10 units. Continue rapid acting insulin at meals and metformin  symbicort 160/4.5  for copd.  F/u one month.      Author:  Lake Bells, MD 03/17/2012 2:37 PM

## 2012-03-18 LAB — METABOLIC PANEL, COMPREHENSIVE
A-G Ratio: 1.9 (ref 1.1–2.5)
ALT (SGPT): 19 IU/L (ref 0–32)
AST (SGOT): 13 IU/L (ref 0–40)
Albumin: 3.7 g/dL (ref 3.6–4.8)
Alk. phosphatase: 63 IU/L (ref 25–165)
BUN/Creatinine ratio: 26 (ref 11–26)
BUN: 21 mg/dL (ref 8–27)
Bilirubin, total: 0.1 mg/dL (ref 0.0–1.2)
CO2: 22 mmol/L (ref 20–32)
Calcium: 9 mg/dL (ref 8.6–10.2)
Chloride: 97 mmol/L (ref 97–108)
Creatinine: 0.81 mg/dL (ref 0.57–1.00)
GFR est non-AA: 79 mL/min/{1.73_m2} (ref >59)
GLOBULIN, TOTAL: 2 g/dL (ref 1.5–4.5)
Glucose: 244 mg/dL — ABNORMAL HIGH (ref 65–99)
Potassium: 4 mmol/L (ref 3.5–5.2)
Protein, total: 5.7 g/dL — ABNORMAL LOW (ref 6.0–8.5)
Sodium: 138 mmol/L (ref 134–144)
eGFR If African American: 91 mL/min/{1.73_m2} (ref >59)

## 2012-03-18 LAB — VITAMIN B12: Vitamin B12: 550 pg/mL (ref 211–946)

## 2012-03-18 LAB — IRON: Iron: 34 ug/dL — ABNORMAL LOW (ref 35–155)

## 2012-03-19 NOTE — Progress Notes (Signed)
Quick Note:    pls call- as we discussed, blood sugars up so continue levemir. Iron level is low, add multivit with iron. b12 level is ok. See you in a few weeks.  ______

## 2012-03-19 NOTE — Progress Notes (Signed)
Spoke with patient and gave her results.SSW,RN

## 2012-03-25 MED ORDER — METFORMIN SR 500 MG 24 HR TABLET
500 mg | ORAL_TABLET | ORAL | Status: DC
Start: 2012-03-25 — End: 2012-12-21

## 2012-03-30 MED ORDER — CYMBALTA 60 MG CAPSULE,DELAYED RELEASE
60 mg | ORAL_CAPSULE | ORAL | Status: DC
Start: 2012-03-30 — End: 2012-07-31

## 2012-04-20 LAB — AMB POC COMPLETE CBC,AUTOMATED ENTER
ABS. LYMPHS (POC): 3.4 10*3/uL (ref 1.2–3.4)
HCT (POC): 34.9 % — AB (ref 35.0–60.0)
HGB (POC): 10.8 g/dL — AB (ref 12.0–16.0)
LYMPHOCYTES (POC): 34.3 % (ref 20.5–51.1)
MCH (POC): 25.7 pg — AB (ref 27.0–31.0)
MCHC (POC): 31 g/dL — AB (ref 33.0–37.0)
MCV (POC): 82.7 fL (ref 80.0–99.9)
PLATELET (POC): 335 10*3/uL (ref 150–450)
RBC (POC): 4.23 M/uL (ref 4.00–6.00)
WBC (POC): 9.8 10*3/uL (ref 4.5–10.5)

## 2012-04-20 LAB — AMB POC HEMOGLOBIN A1C: Hemoglobin A1c (POC): 7.3 % — AB (ref 4.8–5.6)

## 2012-04-20 MED ORDER — INSULIN DETEMIR 100 UNIT/ML (3 ML) SUB-Q PEN
100 unit/mL (3 mL) | PACK | SUBCUTANEOUS | Status: DC
Start: 2012-04-20 — End: 2012-07-13

## 2012-04-20 MED ORDER — INSULIN LISPRO 100 UNIT/ML (3 ML) SUB-Q PEN
100 unit/mL | PACK | SUBCUTANEOUS | Status: DC
Start: 2012-04-20 — End: 2015-03-16

## 2012-04-20 NOTE — Telephone Encounter (Signed)
Kelsey [name]  called and needs recommendations from Dr. Cherly Hensen re 1) Has a cracked rib that hit was on the other side; tree branch did not cause it; 2) what should she do about irregular heart beat? Ph # is [phone].

## 2012-04-20 NOTE — Patient Instructions (Signed)
MyChart Activation    Thank you for requesting access to MyChart. Please follow the instructions below to securely access and download your online medical record. MyChart allows you to send messages to your doctor, view your test results, renew your prescriptions, schedule appointments, and more.    How Do I Sign Up?    1. In your internet browser, go to [email or web address]  2. Click on the First Time User? Click Here link in the Sign In box. You will be redirect to the New Member Sign Up page.  3. Enter your MyChart Access Code exactly as it appears below. You will not need to use this code after you???ve completed the sign-up process. If you do not sign up before the expiration date, you must request a new code.    MyChart Access Code: Not generated  Current MyChart Status: Active (This is the date your MyChart access code will expire)    4. Enter the last four digits of your Social Security Number (xxxx) and Date of Birth (mm/dd/yyyy) as indicated and click Submit. You will be taken to the next sign-up page.  5. Create a MyChart ID. This will be your MyChart login ID and cannot be changed, so think of one that is secure and easy to remember.  6. Create a MyChart password. You can change your password at any time.  7. Enter your Password Reset Question and Answer. This can be used at a later time if you forget your password.   8. Enter your e-mail address. You will receive e-mail notification when new information is available in MyChart.  9. Click Sign Up. You can now view and download portions of your medical record.  10. Click the Download Summary menu link to download a portable copy of your medical information.    Additional Information    If you have questions, please visit the Frequently Asked Questions section of the MyChart website at [email or web address] Remember, MyChart is NOT to be used for urgent needs. For medical emergencies, dial 911.      Results for orders placed in visit on  04/20/12   AMB POC HEMOGLOBIN A1C       Component Value Range    Hemoglobin A1c (POC) 7.3 (*) 4.8 - 5.6 %

## 2012-04-20 NOTE — Progress Notes (Signed)
Kelsey [name] is a 61 y.o. female and presents with   Chief Complaint   Patient presents with   ??? Follow-up     1 mo   .  Pt's blood sugars- better controlled with addition of levemir.  Mostly under 140 in am.. occas up to 180 after eating fruit. Weight stable.  She also c/o of tender lump in breast .  Has had trouble with pollen, but minimal cough on symbicort inhaler. She was told she had enlarged thyroid.        Current Outpatient Prescriptions   Medication Sig Dispense Refill   ??? insulin detemir (LEVEMIR) 100 unit/mL (3 mL) pen Inject 10 units daily  1 Package  11   ??? insulin lispro (HUMALOG) 100 unit/mL kwikpen Inject 2-10 units three times daily  1 Package  11   ??? CYMBALTA 60 mg capsule TAKE ONE CAPSULE BY MOUTH EVERY DAY  90 Cap  2   ??? metFORMIN ER (GLUCOPHAGE XR) 500 mg tablet TAKE 5 TABLETS BY MOUTH DAILY  450 Tab  1   ??? albuterol-ipratropium (DUO-NEB) 2.5 mg-0.5 mg/3 ml nebulizer solution 3 mL by Nebulization route four (4) times daily.  1 Package  3   ??? chlorpheniramine-HYDROcodone (TUSSIONEX) 8-10 mg/5 mL suspension Take 5 mL by mouth every twelve (12) hours as needed for Cough.  120 mL  1   ??? CHANTIX CONTINUING MONTH PAK 1 mg tablet USE AS DIRECTED  56 Tab  4   ??? pantoprazole (PROTONIX) 40 mg tablet TAKE 1 TABLET BY MOUTH EVERY DAY  30 Tab  1   ??? HYDROcodone-acetaminophen (NORCO) 5-325 mg per tablet Take 1 Tab by mouth every four (4) hours as needed.         ??? NITROSTAT 0.4 mg SL tablet TAKE 1 TABLET BY MOUTH UNDER THE TONGUE EVERY 5 MINUTES AS NEEDED FOR CHEST PAIN AS DIRECTED  50 Tab  1   ??? methocarbamol (ROBAXIN) 500 mg tablet TAKE 2 TABLETS 4 TIMES A DAY  100 Tab  0   ??? furosemide (LASIX) 40 mg tablet Take 1 Tab by mouth daily.  30 Tab  11   ??? lisinopril (PRINIVIL, ZESTRIL) 5 mg tablet Take 1 Tab by mouth daily.  30 Tab  11   ??? prasugrel (EFFIENT) 10 mg tablet Take 1 Tab by mouth daily.  30 Tab  11   ??? aspirin delayed-release 81 mg tablet Take 1 Tab by mouth daily.  30 Tab  11   ??? Cholecalciferol,  Vitamin D3, (VITAMIN D3) 1,000 unit Cap Take 5,000 Caps by mouth daily.         ??? traMADol (ULTRAM) 50 mg tablet Take 1 Tab by mouth every six (6) hours as needed.  30 Tab  1   ??? ergocalciferol (ERGOCALCIFEROL) 50,000 unit capsule Take 1 Cap by mouth two (2) days a week.  8 Cap  0   ??? nebivolol (BYSTOLIC) 5 mg tablet Take 10 mg by mouth daily.       ??? omega-3 fatty acids-vitamin e (FISH OIL) 1,000 mg Cap Take 1,200 mg by mouth two (2) times a day.       ??? NITROGLYCERIN (NITROTAB SL) 0.4 mg by SubLINGual route.         Allergies   Allergen Reactions   ??? Avalide (Irbesartan-Hydrochlorothiazide) Myalgia   ??? Demerol (Meperidine) Unknown (comments)   ??? Pcn (Penicillins) Swelling   ??? Statins-Hmg-Coa Reductase Inhibitors Myalgia   ??? Sulfa (Sulfonamide Antibiotics) Unknown (comments)   ???  Tricor (Fenofibrate Micronized) Myalgia     Past Medical History   Diagnosis Date   ??? Hypertension    ??? CAD (coronary artery disease)    ??? Chronic pain    ??? Arthritis    ??? COPD    ??? Diabetes    ??? Goiter 04/20/2012     Past Surgical History   Procedure Date   ??? Hx orthopaedic      rt knee   ??? Pr cardiac surg procedure unlist      PTCA   ??? Hx other surgical      ganglion cust removed R     Family History   Problem Relation Age of Onset   ??? Lung Disease Mother    ??? Stroke Father      History   Substance Use Topics   ??? Smoking status: Current Some Day Smoker -- 0.3 packs/day for 45 years     Types: Cigarettes   ??? Smokeless tobacco: Never Used    Comment: passed ready   ??? Alcohol Use: No      may be 3 times a year if that          Objective:  BP 110/80   Ht 5\' 6"  (1.676 m)   Wt 210 lb (95.255 kg)   BMI 33.89 kg/m2  wdwn 61 yo wf  In NAD. A&O.  HEENT -- Pupils round.  O/P Clear.  Neck -- Supple. No JVD. Min tm-  Heart --irreg- extra systole every 4th-6th  beat. No R/M/G. Left breast no masses.  Left sixth-seventh rib tender ant on palpation  Lungs -- CTA.  Abdomen -- Soft. Non-tender. Non-distended. No masses. Bowel sounds present.  Extremities --  No edema.+1 pulses      Results for orders placed in visit on 04/20/12   AMB POC HEMOGLOBIN A1C       Component Value Range    Hemoglobin A1c (POC) 7.3 (*) 4.8 - 5.6 %   AMB POC COMPLETE CBC,AUTOMATED ENTER       Component Value Range    WBC (POC) 9.8  4.5 - 10.5 K/uL    RBC (POC) 4.23  4.00 - 6.00 M/uL    HGB (POC) 10.8 (*) 12.0 - 16.0 g/dL    HCT (POC) 13.0 (*) 86.5 - 60.0 %    MCV (POC) 82.7  80.0 - 99.9 fL    MCH (POC) 25.7 (*) 27.0 - 31.0 pg    MCHC (POC) 31.0 (*) 33.0 - 37.0 g/dL    PLATELET (POC) 784  150 - 450 K/uL    LYMPHOCYTES (POC) 34.3  20.5 - 51.1 %    ABS. LYMPHS (POC) 3.4  1.2 - 3.4 K/uL       Assessment/Plan:  1. DM w/o complication type II  AMB POC HEMOGLOBIN A1C   2. Irregular heart beat  AMB POC EKG ROUTINE W/ 12 LEADS, INTER & REP   3. Goiter  TSH, 3RD GENERATION, T4, FREE, PR COLLECTION VENOUS BLOOD,VENIPUNCTURE   4. Chest pain, unspecified  XR RIBS LT UNI 2 V   5. Anemia  AMB POC COMPLETE CBC,AUTOMATED ENTER     F/u one month, cut out bacon and sausage      Author:  Lake Bells, MD 04/20/2012 3:43 PM

## 2012-04-21 LAB — T4, FREE: T4, Free: 1.09 ng/dL (ref 0.82–1.77)

## 2012-04-21 LAB — TSH 3RD GENERATION: TSH: 1.8 u[IU]/mL (ref 0.450–4.500)

## 2012-04-21 NOTE — Telephone Encounter (Signed)
pls call- coughing could have caused rib fracture- or it could be old and recently irritated.  If pain is worse, we will get bone scan.  For irreg heart beat, keep follow up cardiology appt.

## 2012-04-21 NOTE — Telephone Encounter (Signed)
Please advise

## 2012-04-21 NOTE — Telephone Encounter (Signed)
Called patient and relayed doctor's message

## 2012-04-23 NOTE — Progress Notes (Signed)
Quick Note:    pls call- thyroid levels good. F/u with cardiology next appt-sche?July? Keep appt with me next month  ______

## 2012-04-23 NOTE — Progress Notes (Signed)
Quick Note:    Called patient and relayed doctor's message   ______

## 2012-05-28 LAB — AMB POC URINALYSIS DIP STICK MANUAL W/O MICRO
Bilirubin (UA POC): NEGATIVE
Blood (UA POC): NEGATIVE
Glucose (UA POC): NEGATIVE
Ketones (UA POC): NEGATIVE
Leukocyte esterase (UA POC): NEGATIVE
Nitrites (UA POC): NEGATIVE
Protein (UA POC): NEGATIVE mg/dL
Specific gravity (UA POC): 1.02 (ref 1.001–1.035)
Urobilinogen (UA POC): 0.2 (ref 0.2–1)
pH (UA POC): 6.5 (ref 4.6–8.0)

## 2012-05-28 LAB — AMB POC COMPLETE CBC,AUTOMATED ENTER
ABS. LYMPHS (POC): 3.1 10*3/uL (ref 1.2–3.4)
HCT (POC): 34.8 % — AB (ref 35.0–60.0)
HGB (POC): 10.8 g/dL — AB (ref 12.0–16.0)
MCH (POC): 26.1 pg — AB (ref 27.0–31.0)
MCHC (POC): 31.1 g/dL — AB (ref 33.0–37.0)
MCV (POC): 83.9 fL (ref 80.0–99.9)
PLATELET (POC): 325 10*3/uL (ref 150–450)
RBC (POC): 4.14 M/uL (ref 4.00–6.00)
WBC (POC): 9.2 10*3/uL (ref 4.5–10.5)

## 2012-05-28 LAB — AMB POC HEMOGLOBIN A1C: Hemoglobin A1c (POC): 7.2 % — AB (ref 4.8–5.6)

## 2012-05-28 MED ORDER — GABAPENTIN 300 MG CAP
300 mg | ORAL_CAPSULE | Freq: Three times a day (TID) | ORAL | Status: DC
Start: 2012-05-28 — End: 2012-07-13

## 2012-05-28 NOTE — Progress Notes (Signed)
Kelsey [name] is a 61 y.o. female and presents with   Chief Complaint   Patient presents with   ??? Follow-up       .  Stomach staying yuucky. Has lost some weight- due to stomach.  Blood sugars improved, 120-180.  Has pain in feet at bedtime, can't go to sleep.  Does not think cymbalta is helping pain,  Thinks thyroid is getting bigger.  When she lies down she almost feels like she could choke      Current Outpatient Prescriptions   Medication Sig Dispense Refill   ??? carvedilol (COREG) 12.5 mg tablet Take  by mouth two (2) times daily (with meals).       ??? losartan (COZAAR) 25 mg tablet Take  by mouth daily.       ??? bumetanide (BUMEX) 2 mg tablet Take 2 mg by mouth daily.       ??? pravastatin (PRAVACHOL) 40 mg tablet Take 40 mg by mouth nightly.       ??? gabapentin (NEURONTIN) 300 mg capsule Take 1 Cap by mouth three (3) times daily.  30 Cap  3   ??? insulin detemir (LEVEMIR) 100 unit/mL (3 mL) pen Inject 10 units daily  1 Package  11   ??? insulin lispro (HUMALOG) 100 unit/mL kwikpen Inject 2-10 units three times daily  1 Package  11   ??? CYMBALTA 60 mg capsule TAKE ONE CAPSULE BY MOUTH EVERY DAY  90 Cap  2   ??? metFORMIN ER (GLUCOPHAGE XR) 500 mg tablet TAKE 5 TABLETS BY MOUTH DAILY  450 Tab  1   ??? albuterol-ipratropium (DUO-NEB) 2.5 mg-0.5 mg/3 ml nebulizer solution 3 mL by Nebulization route four (4) times daily.  1 Package  3   ??? chlorpheniramine-HYDROcodone (TUSSIONEX) 8-10 mg/5 mL suspension Take 5 mL by mouth every twelve (12) hours as needed for Cough.  120 mL  1   ??? CHANTIX CONTINUING MONTH PAK 1 mg tablet USE AS DIRECTED  56 Tab  4   ??? pantoprazole (PROTONIX) 40 mg tablet TAKE 1 TABLET BY MOUTH EVERY DAY  30 Tab  1   ??? HYDROcodone-acetaminophen (NORCO) 5-325 mg per tablet Take 1 Tab by mouth every four (4) hours as needed.         ??? NITROSTAT 0.4 mg SL tablet TAKE 1 TABLET BY MOUTH UNDER THE TONGUE EVERY 5 MINUTES AS NEEDED FOR CHEST PAIN AS DIRECTED  50 Tab  1   ??? methocarbamol (ROBAXIN) 500 mg tablet TAKE  2 TABLETS 4 TIMES A DAY  100 Tab  0   ??? prasugrel (EFFIENT) 10 mg tablet Take 1 Tab by mouth daily.  30 Tab  11   ??? aspirin delayed-release 81 mg tablet Take 1 Tab by mouth daily.  30 Tab  11   ??? Cholecalciferol, Vitamin D3, (VITAMIN D3) 1,000 unit Cap Take 5,000 Caps by mouth daily.         ??? traMADol (ULTRAM) 50 mg tablet Take 1 Tab by mouth every six (6) hours as needed.  30 Tab  1   ??? ergocalciferol (ERGOCALCIFEROL) 50,000 unit capsule Take 1 Cap by mouth two (2) days a week.  8 Cap  0   ??? omega-3 fatty acids-vitamin e (FISH OIL) 1,000 mg Cap Take 1,200 mg by mouth two (2) times a day.       ??? NITROGLYCERIN (NITROTAB SL) 0.4 mg by SubLINGual route.         Allergies  Allergen Reactions   ??? Avalide (Irbesartan-Hydrochlorothiazide) Myalgia   ??? Demerol (Meperidine) Unknown (comments)   ??? Pcn (Penicillins) Swelling   ??? Statins-Hmg-Coa Reductase Inhibitors Myalgia   ??? Sulfa (Sulfonamide Antibiotics) Unknown (comments)   ??? Tricor (Fenofibrate Micronized) Myalgia     Past Medical History   Diagnosis Date   ??? Hypertension    ??? CAD (coronary artery disease)    ??? Chronic pain    ??? Arthritis    ??? COPD    ??? Diabetes    ??? Goiter 04/20/2012     Past Surgical History   Procedure Date   ??? Hx orthopaedic      rt knee   ??? Pr cardiac surg procedure unlist      PTCA   ??? Hx other surgical      ganglion cust removed R     Family History   Problem Relation Age of Onset   ??? Lung Disease Mother    ??? Stroke Father      History   Substance Use Topics   ??? Smoking status: Current Some Day Smoker -- 0.3 packs/day for 45 years     Types: Cigarettes   ??? Smokeless tobacco: Never Used    Comment: passed ready   ??? Alcohol Use: No      may be 3 times a year if that          Objective:  BP 100/60   Ht 5\' 6"  (1.676 m)   Wt 208 lb (94.348 kg)   BMI 33.57 kg/m2  wdwn 61 yo wf  In NAD. A&O.  HEENT -- Pupils round.  O/P Clear.  Neck -- Supple. No JVD. mod tm,  Heart -- RRR. 2/6 asm  Lungs -- CTA.  Abdomen -- Soft. Non-tender. Non-distended. No masses.  Bowel sounds present.  Extremities -- No edema.+1 pulses      Results for orders placed in visit on 05/28/12   AMB POC URINALYSIS DIP STICK MANUAL W/O MICRO       Component Value Range    Color Yellow  (none)    Clarity Clear  (none)    Glucose Negative  (none)    Bilirubin Negative  (none)    Ketones Negative  (none)    Spec.Grav. 1.020  1.001 - 1.035    Blood Negative  (none)    pH 6.5  4.6 - 8.0    Protein, Urine Negative  Negative mg/dL    Urobilinogen 0.2 mg/dL  0.2 - 1    Nitrites Negative  (none)    Leukocyte esterase Negative  (none)   AMB POC HEMOGLOBIN A1C       Component Value Range    Hemoglobin A1c (POC) 7.2 (*) 4.8 - 5.6 %   AMB POC COMPLETE CBC,AUTOMATED ENTER       Component Value Range    WBC (POC) 9.2  4.5 - 10.5 K/uL    RBC (POC) 4.14  4.00 - 6.00 M/uL    HGB (POC) 10.8 (*) 12.0 - 16.0 g/dL    HCT (POC) 78.2 (*) 95.6 - 60.0 %    MCV (POC) 83.9  80.0 - 99.9 fL    MCH (POC) 26.1 (*) 27.0 - 31.0 pg    MCHC (POC) 31.1 (*) 33.0 - 37.0 g/dL    PLATELET (POC) 213  150 - 450 K/uL    LYMPHOCYTES (POC) 34.0.  20.5 - 51.1 %    ABS. LYMPHS (POC) 3.1  1.2 - 3.4 K/uL  METABOLIC PANEL, COMPREHENSIVE       Component Value Range    Glucose 119 (*) 65 - 99 mg/dL    BUN 16  8 - 27 mg/dL    Creatinine 5.40  9.81 - 1.00 mg/dL    GFR est non-AA 79  >19 mL/min/1.73    eGFR If Africn Am 91  >59 mL/min/1.73    BUN/Creatinine ratio 20  11 - 26    Sodium 133 (*) 134 - 144 mmol/L    Potassium 4.2  3.5 - 5.2 mmol/L    Chloride 92 (*) 97 - 108 mmol/L    CO2 25  20 - 32 mmol/L    Calcium 10.0  8.6 - 10.2 mg/dL    Protein, total 6.4  6.0 - 8.5 g/dL    Albumin 4.3  3.6 - 4.8 g/dL    GLOBULIN, TOTAL 2.1  1.5 - 4.5 g/dL    A-G Ratio 2.0  1.1 - 2.5    Bilirubin, total 0.3  0.0 - 1.2 mg/dL    Alk. phosphatase 58  25 - 165 IU/L    AST 24  0 - 40 IU/L    ALT 26  0 - 32 IU/L         Assessment/Plan:  1. Other chest pain     2. Mixed hyperlipidemia  METABOLIC PANEL, COMPREHENSIVE   3. DM w/o complication type II  AMB POC URINALYSIS DIP  STICK MANUAL W/O MICRO, AMB POC HEMOGLOBIN A1C   4. Goiter  PR COLLECTION VENOUS BLOOD,VENIPUNCTURE, US THYROID/PARATHYROID/SOFT TISS   5. Neuropathy  gabapentin (NEURONTIN) 300 mg capsule   6. Anemia  AMB POC COMPLETE CBC,AUTOMATED ENTER     Check spep and cbc next visit., hemoccult. Add neurontin for neuropathy      Author:  Lake Bells, MD 05/29/2012 2:20 PM

## 2012-05-28 NOTE — Patient Instructions (Addendum)
Increase levemir to 12 units, decrease metformin to 2 at bedtime,     Add neurontin at bedtime.    Results for orders placed in visit on 05/28/12   AMB POC URINALYSIS DIP STICK MANUAL W/O MICRO       Component Value Range    Color Yellow  (none)    Clarity Clear  (none)    Glucose Negative  (none)    Bilirubin Negative  (none)    Ketones Negative  (none)    Spec.Grav. 1.020  1.001 - 1.035    Blood Negative  (none)    pH 6.5  4.6 - 8.0    Protein, Urine Negative  Negative mg/dL    Urobilinogen 0.2 mg/dL  0.2 - 1    Nitrites Negative  (none)    Leukocyte esterase Negative  (none)   AMB POC HEMOGLOBIN A1C       Component Value Range    Hemoglobin A1c (POC) 7.2 (*) 4.8 - 5.6 %   AMB POC COMPLETE CBC,AUTOMATED ENTER       Component Value Range    WBC (POC) 9.2  4.5 - 10.5 K/uL    RBC (POC) 4.14  4.00 - 6.00 M/uL    HGB (POC) 10.8 (*) 12.0 - 16.0 g/dL    HCT (POC) 16.1 (*) 09.6 - 60.0 %    MCV (POC) 83.9  80.0 - 99.9 fL    MCH (POC) 26.1 (*) 27.0 - 31.0 pg    MCHC (POC) 31.1 (*) 33.0 - 37.0 g/dL    PLATELET (POC) 045  150 - 450 K/uL    LYMPHOCYTES (POC) 34.0.  20.5 - 51.1 %    ABS. LYMPHS (POC) 3.1  1.2 - 3.4 K/uL

## 2012-05-29 LAB — METABOLIC PANEL, COMPREHENSIVE
A-G Ratio: 2 (ref 1.1–2.5)
ALT (SGPT): 26 IU/L (ref 0–32)
AST (SGOT): 24 IU/L (ref 0–40)
Albumin: 4.3 g/dL (ref 3.6–4.8)
Alk. phosphatase: 58 IU/L (ref 25–165)
BUN/Creatinine ratio: 20 (ref 11–26)
BUN: 16 mg/dL (ref 8–27)
Bilirubin, total: 0.3 mg/dL (ref 0.0–1.2)
CO2: 25 mmol/L (ref 20–32)
Calcium: 10 mg/dL (ref 8.6–10.2)
Chloride: 92 mmol/L — ABNORMAL LOW (ref 97–108)
Creatinine: 0.81 mg/dL (ref 0.57–1.00)
GFR est non-AA: 79 mL/min/{1.73_m2} (ref >59)
GLOBULIN, TOTAL: 2.1 g/dL (ref 1.5–4.5)
Glucose: 119 mg/dL — ABNORMAL HIGH (ref 65–99)
Potassium: 4.2 mmol/L (ref 3.5–5.2)
Protein, total: 6.4 g/dL (ref 6.0–8.5)
Sodium: 133 mmol/L — ABNORMAL LOW (ref 134–144)
eGFR If African American: 91 mL/min/{1.73_m2} (ref >59)

## 2012-06-03 MED ORDER — NITROSTAT 0.4 MG SUBLINGUAL TABLET
0.4 mg | ORAL_TABLET | SUBLINGUAL | Status: DC
Start: 2012-06-03 — End: 2015-02-17

## 2012-06-03 MED ORDER — METHOCARBAMOL 500 MG TAB
500 mg | ORAL_TABLET | ORAL | Status: DC
Start: 2012-06-03 — End: 2012-07-13

## 2012-06-03 NOTE — Telephone Encounter (Signed)
Kelsey [name] is calling for [employer]. She was admitted to [facility] last night for mild heart attack. Under care of her cardiologist, Dr. Jed Limerick. If needed Kriste Basque can be reached on her cell ph # which is [phone].

## 2012-06-04 NOTE — Telephone Encounter (Signed)
icalled-tlaked to [employer]

## 2012-06-11 NOTE — Telephone Encounter (Signed)
Stoney Bang is calling to speak to Dr. Cherly Hensen re Cristie Hem. She would like to give her a progress report re her hospital admission @ [facility]. Please call. Ph # is [phone].

## 2012-06-11 NOTE — Telephone Encounter (Signed)
Patient in Step down unit now, Kriste Basque would like to speak with you concerning some issues, please call her on her cell [phone]

## 2012-06-28 MED ORDER — CHANTIX CONTINUING MONTH PAK 1 MG TABLET
1 mg | ORAL_TABLET | ORAL | Status: DC
Start: 2012-06-28 — End: 2012-09-29

## 2012-07-01 NOTE — Progress Notes (Signed)
Kelsey [name] is a 61 y.o. female and presents with   Chief Complaint   Patient presents with   ??? Follow-up   .  Had cabg at [facility].  Saw Dr. Gale Journey in hospital.  Changed levemir to 15 units hs and decreased metformin to 2 at night. Has fallen once at home- she thinks bp is lower.  Hurt rt shoulder.  Back having lots of spasms- she is takine xanax 4 times a day for that.  We discussed card rehab and  Dr. Bary Richard told her not for 6 weeks. PA decreased her bp meds, but she is not sure what med. Blood sugar      Current Outpatient Prescriptions   Medication Sig Dispense Refill   ??? CHANTIX CONTINUING MONTH PAK 1 mg tablet USE AS DIRECTED  56 Tab  2   ??? NITROSTAT 0.4 mg SL tablet TAKE 1 TABLET SL EVERY 5 MINUTES AS NEEDED FOR CHEST PAIN AS DIRECTED  50 Tab  1   ??? methocarbamol (ROBAXIN) 500 mg tablet TAKE 2 TABLETS 4 TIMES A DAY  100 Tab  0   ??? carvedilol (COREG) 12.5 mg tablet Take  by mouth two (2) times daily (with meals).       ??? losartan (COZAAR) 25 mg tablet Take  by mouth daily.       ??? bumetanide (BUMEX) 2 mg tablet Take 2 mg by mouth daily.       ??? pravastatin (PRAVACHOL) 40 mg tablet Take 40 mg by mouth nightly.       ??? gabapentin (NEURONTIN) 300 mg capsule Take 1 Cap by mouth three (3) times daily.  30 Cap  3   ??? insulin detemir (LEVEMIR) 100 unit/mL (3 mL) pen Inject 10 units daily  1 Package  11   ??? insulin lispro (HUMALOG) 100 unit/mL kwikpen Inject 2-10 units three times daily  1 Package  11   ??? CYMBALTA 60 mg capsule TAKE ONE CAPSULE BY MOUTH EVERY DAY  90 Cap  2   ??? metFORMIN ER (GLUCOPHAGE XR) 500 mg tablet TAKE 5 TABLETS BY MOUTH DAILY  450 Tab  1   ??? albuterol-ipratropium (DUO-NEB) 2.5 mg-0.5 mg/3 ml nebulizer solution 3 mL by Nebulization route four (4) times daily.  1 Package  3   ??? chlorpheniramine-HYDROcodone (TUSSIONEX) 8-10 mg/5 mL suspension Take 5 mL by mouth every twelve (12) hours as needed for Cough.  120 mL  1   ??? pantoprazole (PROTONIX) 40 mg tablet TAKE 1 TABLET BY MOUTH EVERY DAY  30  Tab  1   ??? HYDROcodone-acetaminophen (NORCO) 5-325 mg per tablet Take 1 Tab by mouth every four (4) hours as needed.         ??? methocarbamol (ROBAXIN) 500 mg tablet TAKE 2 TABLETS 4 TIMES A DAY  100 Tab  0   ??? prasugrel (EFFIENT) 10 mg tablet Take 1 Tab by mouth daily.  30 Tab  11   ??? aspirin delayed-release 81 mg tablet Take 1 Tab by mouth daily.  30 Tab  11   ??? Cholecalciferol, Vitamin D3, (VITAMIN D3) 1,000 unit Cap Take 5,000 Caps by mouth daily.         ??? traMADol (ULTRAM) 50 mg tablet Take 1 Tab by mouth every six (6) hours as needed.  30 Tab  1   ??? ergocalciferol (ERGOCALCIFEROL) 50,000 unit capsule Take 1 Cap by mouth two (2) days a week.  8 Cap  0   ??? omega-3 fatty acids-vitamin  e (FISH OIL) 1,000 mg Cap Take 1,200 mg by mouth two (2) times a day.       ??? NITROGLYCERIN (NITROTAB SL) 0.4 mg by SubLINGual route.         Allergies   Allergen Reactions   ??? Avalide (Irbesartan-Hydrochlorothiazide) Myalgia   ??? Demerol (Meperidine) Unknown (comments)   ??? Pcn (Penicillins) Swelling   ??? Statins-Hmg-Coa Reductase Inhibitors Myalgia   ??? Sulfa (Sulfonamide Antibiotics) Unknown (comments)   ??? Tricor (Fenofibrate Micronized) Myalgia     Past Medical History   Diagnosis Date   ??? Hypertension    ??? CAD (coronary artery disease)    ??? Chronic pain    ??? Arthritis    ??? COPD    ??? Diabetes    ??? Goiter 04/20/2012     Past Surgical History   Procedure Date   ??? Hx orthopaedic      rt knee   ??? Pr cardiac surg procedure unlist      PTCA   ??? Hx other surgical      ganglion cust removed R     Family History   Problem Relation Age of Onset   ??? Lung Disease Mother    ??? Stroke Father      History   Substance Use Topics   ??? Smoking status: Current Some Day Smoker -- 0.3 packs/day for 45 years     Types: Cigarettes   ??? Smokeless tobacco: Never Used    Comment: passed ready         Objective:  BP 90/60   Ht 5\' 6"  (1.676 m)   Wt 200 lb (90.719 kg)   BMI 32.28 kg/m2  wdwn  61 yo wf-   In NAD. A&O.  HEENT -- Pupils round.  O/P Clear.  Neck --  Supple. No JVD.  Heart --sl tach 2/6 asm  Lungs -- CTA.  Abdomen -- Soft. Non-tender. Non-distended. No masses. Bowel sounds present.  Extremities -- No edema.poor pulses   rt shoulder- pain over biceps tendon insertion, decreased int rotation due to pain     Assessment/Plan:  1. Coronary atherosclerosis of native coronary artery    2. Mixed hyperlipidemia    3. DM w/o complication type II    4. Shoulder pain, right      Stop bumex, take only if swelling or weight goes up 2-3 lbs in one day. Fu 4-6 weeks      Author:  Lake Bells, MD 07/01/2012 10:45 AM

## 2012-07-01 NOTE — Patient Instructions (Signed)
Hold bumex until you develop swelling in feet or until weight goes up 2-3 lbs.

## 2012-07-20 NOTE — Progress Notes (Signed)
Kelsey [name] is a 61 y.o. female and presents with   Chief Complaint   Patient presents with   ??? Follow-up   .  Still having shoulder pain,  Hurts to internally rotate  And raise arm       Current Outpatient Prescriptions   Medication Sig Dispense Refill   ??? aspirin (ASPIRIN) 325 mg tablet Take 325 mg by mouth daily.       ??? insulin detemir (LEVEMIR) 100 unit/mL injection by SubCUTAneous route nightly. Inject 15 units at bedtime       ??? carvedilol (COREG) 6.25 mg tablet Take 6.25 mg by mouth daily.       ??? CHANTIX CONTINUING MONTH PAK 1 mg tablet USE AS DIRECTED  56 Tab  2   ??? NITROSTAT 0.4 mg SL tablet TAKE 1 TABLET SL EVERY 5 MINUTES AS NEEDED FOR CHEST PAIN AS DIRECTED  50 Tab  1   ??? bumetanide (BUMEX) 2 mg tablet Take 2 mg by mouth daily.       ??? pravastatin (PRAVACHOL) 40 mg tablet Take 40 mg by mouth nightly.       ??? insulin lispro (HUMALOG) 100 unit/mL kwikpen Inject 2-10 units three times daily  1 Package  11   ??? CYMBALTA 60 mg capsule TAKE ONE CAPSULE BY MOUTH EVERY DAY  90 Cap  2   ??? metFORMIN ER (GLUCOPHAGE XR) 500 mg tablet TAKE 5 TABLETS BY MOUTH DAILY  450 Tab  1   ??? albuterol-ipratropium (DUO-NEB) 2.5 mg-0.5 mg/3 ml nebulizer solution 3 mL by Nebulization route four (4) times daily.  1 Package  3   ??? pantoprazole (PROTONIX) 40 mg tablet TAKE 1 TABLET BY MOUTH EVERY DAY  30 Tab  1   ??? HYDROcodone-acetaminophen (NORCO) 5-325 mg per tablet Take 1 Tab by mouth every four (4) hours as needed.         ??? methocarbamol (ROBAXIN) 500 mg tablet TAKE 2 TABLETS 4 TIMES A DAY  100 Tab  0   ??? Cholecalciferol, Vitamin D3, (VITAMIN D3) 1,000 unit Cap Take 5,000 Caps by mouth daily.         ??? omega-3 fatty acids-vitamin e (FISH OIL) 1,000 mg Cap Take 1,200 mg by mouth two (2) times a day.       ??? NITROGLYCERIN (NITROTAB SL) 0.4 mg by SubLINGual route.         Allergies   Allergen Reactions   ??? Avalide (Irbesartan-Hydrochlorothiazide) Myalgia   ??? Demerol (Meperidine) Unknown (comments)   ??? Pcn (Penicillins) Swelling    ??? Statins-Hmg-Coa Reductase Inhibitors Myalgia   ??? Sulfa (Sulfonamide Antibiotics) Unknown (comments)   ??? Tricor (Fenofibrate Micronized) Myalgia     Past Medical History   Diagnosis Date   ??? Hypertension    ??? CAD (coronary artery disease)    ??? Chronic pain    ??? Arthritis    ??? COPD    ??? Diabetes    ??? Goiter 04/20/2012     Past Surgical History   Procedure Date   ??? Hx orthopaedic      rt knee   ??? Pr cardiac surg procedure unlist      PTCA   ??? Hx other surgical      ganglion cust removed R     Family History   Problem Relation Age of Onset   ??? Lung Disease Mother    ??? Stroke Father      History   Substance Use Topics   ???  Smoking status: Current Some Day Smoker -- 0.3 packs/day for 45 years     Types: Cigarettes   ??? Smokeless tobacco: Never Used    Comment: passed ready   ??? Alcohol Use: No      may be 3 times a year if that          Objective:  BP 120/80   Ht 5\' 6"  (1.676 m)   Wt 212 lb (96.163 kg)   BMI 34.22 kg/m2  wdwn 61 yo wf  In NAD. A&O.  HEENT -- Pupils round.  O/P Clear.  Neck -- Supple. No JVD.  Heart -- RRR. No R/M/G.  Lungs -- CTA.  Abdomen -- Soft. Non-tender. Non-distended. No masses. Bowel sounds present.  Extremities -- No edema.right shoulder pain with rom        Assessment/Plan:  1. Shoulder pain  XR SHOULDER RT AP/LAT MIN 2 V           Author:  Lake Bells, MD 07/20/2012 3:34 PM     Indications:   Unexplained monoarthritis    Procedure:  After consent was obtained, using sterile technique the right shoulder joint was prepped using Betadine. Local anesthetic used: 1% lidocaine.. The joint was entered     Kenalog 40 mg was mixed with 1% lidocaine 1 ml  and injected into the joint and the needle withdrawn.  The procedure was well tolerated.  The patient is asked to continue to rest the joint for a few more days before resuming regular activities.  It may be more painful for the first 1-2 days.  Watch for fever, or increased swelling or persistent pain in the joint. Call or return to clinic prn if such  symptoms occur or there is failure to improve as anticipated.

## 2012-07-24 MED ORDER — LIDOCAINE (PF) 10 MG/ML (1 %) IJ SOLN
10 mg/mL (1 %) | Freq: Once | INTRAMUSCULAR | Status: AC
Start: 2012-07-24 — End: 2012-07-23

## 2012-08-01 MED ORDER — CYMBALTA 60 MG CAPSULE,DELAYED RELEASE
60 mg | ORAL_CAPSULE | ORAL | Status: DC
Start: 2012-08-01 — End: 2013-10-07

## 2012-09-02 NOTE — Telephone Encounter (Signed)
Kriste Basque is calling to speak to Kelsey [name] about [name] . Needs to discuss her medication. Please call. Ph # is [phone].

## 2012-09-02 NOTE — Telephone Encounter (Signed)
Patient agreed to try Omeprazole 40 mg

## 2012-09-10 ENCOUNTER — Encounter

## 2012-09-10 NOTE — Telephone Encounter (Signed)
Prilosec OTC not working  Needs something else, please order, going on vacation tomorrow,  Ins would not approve nexium

## 2012-09-10 NOTE — Telephone Encounter (Signed)
pls call- i have sent in rx for protonix.  Call in a month if that is not helping.

## 2012-09-13 NOTE — Telephone Encounter (Signed)
Called patient and left message awaiting response

## 2012-10-01 LAB — AMB POC URINALYSIS DIP STICK MANUAL W/O MICRO
Bilirubin (UA POC): NEGATIVE
Blood (UA POC): NEGATIVE
Glucose (UA POC): NEGATIVE
Ketones (UA POC): NEGATIVE
Nitrites (UA POC): NEGATIVE
Protein (UA POC): NEGATIVE mg/dL
Specific gravity (UA POC): 1.02 (ref 1.001–1.035)
Urobilinogen (UA POC): 0.2 (ref 0.2–1)
pH (UA POC): 5.5 (ref 4.6–8.0)

## 2012-10-01 LAB — AMB POC LIPID PROFILE
Cholesterol (POC): 182 mg/dL (ref 100–199)
HDL Cholesterol (POC): 66 mg/dL (ref 35–150)
LDL Cholesterol (POC): 70 mg/dL (ref 0–129)
Non-HDL Goal (POC): 116
TChol/HDL Ratio (POC): 2.8 CALC (ref 0.0–5.0)
Triglycerides (POC): 232 mg/dL — AB (ref 0–150)

## 2012-10-01 LAB — AMB POC HEMOGLOBIN A1C: Hemoglobin A1c (POC): 6.6 % — AB (ref 4.8–5.6)

## 2012-10-01 LAB — AMB POC COMPLETE CBC,AUTOMATED ENTER
ABS. LYMPHS (POC): 2.4 10*3/uL (ref 1.2–3.4)
HCT (POC): 38.1 % (ref 35.0–60.0)
HGB (POC): 12.4 g/dL (ref 12.0–16.0)
LYMPHOCYTES (POC): 26.2 % (ref 20.5–51.1)
MCH (POC): 29.9 pg (ref 27.0–31.0)
MCHC (POC): 32.6 g/dL — AB (ref 33.0–37.0)
MCV (POC): 91.7 fL (ref 80.0–99.9)
PLATELET (POC): 237 10*3/uL (ref 150–450)
RBC (POC): 4.15 M/uL (ref 4.00–6.00)
WBC (POC): 9.1 10*3/uL (ref 4.5–10.5)

## 2012-10-01 NOTE — Patient Instructions (Addendum)
Results for orders placed in visit on 10/01/12   AMB POC URINALYSIS DIP STICK MANUAL W/O MICRO       Component Value Range    Color (UA POC) Yellow  (none)    Clarity (UA POC) Clear  (none)    Glucose (UA POC) Negative  (none)    Bilirubin (UA POC) Negative  (none)    Ketones (UA POC) Negative  (none)    Specific gravity (UA POC) 1.020  1.001 - 1.035    Blood (UA POC) Negative  (none)    pH (UA POC) 5.5  4.6 - 8.0    Protein (UA POC) Negative  Negative mg/dL    Urobilinogen (UA POC) 0.2 mg/dL  0.2 - 1    Nitrites (UA POC) Negative  (none)    Leukocyte esterase (UA POC) Trace  (none)   AMB POC HEMOGLOBIN A1C       Component Value Range    Hemoglobin A1c (POC) 6.6 (*) 4.8 - 5.6 %   AMB POC LIPID PROFILE       Component Value Range    Cholesterol (POC) 182  100 - 199 mg/dL    Triglycerides (POC) 232 (*) 0 - 150 mg/dL    HDL Cholesterol (POC) 66  35 - 150 mg/dL    LDL Cholesterol (POC) 70  0 - 129 mg/dL    Non-HDL Goal (POC) 578      TChol/HDL Ratio (POC) 2.8  0.0 - 5.0 CALC   AMB POC COMPLETE CBC,AUTOMATED ENTER       Component Value Range    WBC (POC) 9.1  4.5 - 10.5 K/uL    RBC (POC) 4.15  4.00 - 6.00 M/uL    HGB (POC) 12.4  12.0 - 16.0 g/dL    HCT (POC) 46.9  62.9 - 60.0 %    MCV (POC) 91.7  80.0 - 99.9 fL    MCH (POC) 29.9  27.0 - 31.0 pg    MCHC (POC) 32.6 (*) 33.0 - 37.0 g/dL    PLATELET (POC) 528  150 - 450 K/uL    LYMPHOCYTES (POC) 26.2  20.5 - 51.1 %    ABS. LYMPHS (POC) 2.4  1.2 - 3.4 K/uL

## 2012-10-01 NOTE — Progress Notes (Signed)
History and Physical    Kelsey [name] is a 61 y.o. female presents for physical exam.  Feeling  better    Past Medical History   Diagnosis Date   ??? Hypertension    ??? CAD (coronary artery disease)    ??? Chronic pain    ??? Arthritis    ??? COPD    ??? Diabetes    ??? Goiter 04/20/2012     Past Surgical History   Procedure Date   ??? Hx orthopaedic      rt knee   ??? Pr cardiac surg procedure unlist      PTCA   ??? Hx other surgical      ganglion cust removed R     Current Outpatient Prescriptions   Medication Sig Dispense Refill   ??? diphenhydrAMINE (BENADRYL) 25 mg capsule Take 25 mg by mouth every six (6) hours as needed.       ??? Ferrous Fumarate 325 mg (106 mg iron) Tab Take  by mouth.       ??? omeprazole (PRILOSEC) 40 mg capsule Take 40 mg by mouth daily.       ??? CHANTIX 1 mg tablet USE AS DIRECTED  56 Tab  2   ??? CYMBALTA 60 mg capsule TAKE ONE CAPSULE BY MOUTH EVERY DAY  90 Cap  2   ??? aspirin (ASPIRIN) 325 mg tablet Take 325 mg by mouth daily.       ??? insulin detemir (LEVEMIR) 100 unit/mL injection by SubCUTAneous route nightly. Inject 15 units at bedtime       ??? carvedilol (COREG) 6.25 mg tablet Take 6.25 mg by mouth daily.       ??? NITROSTAT 0.4 mg SL tablet TAKE 1 TABLET SL EVERY 5 MINUTES AS NEEDED FOR CHEST PAIN AS DIRECTED  50 Tab  1   ??? bumetanide (BUMEX) 2 mg tablet Take 2 mg by mouth daily.       ??? pravastatin (PRAVACHOL) 40 mg tablet Take 40 mg by mouth nightly.       ??? insulin lispro (HUMALOG) 100 unit/mL kwikpen Inject 2-10 units three times daily  1 Package  11   ??? metFORMIN ER (GLUCOPHAGE XR) 500 mg tablet TAKE 5 TABLETS BY MOUTH DAILY  450 Tab  1   ??? methocarbamol (ROBAXIN) 500 mg tablet TAKE 2 TABLETS 4 TIMES A DAY  100 Tab  0   ??? Cholecalciferol, Vitamin D3, (VITAMIN D3) 1,000 unit Cap Take 5,000 Caps by mouth daily.         ??? omega-3 fatty acids-vitamin e (FISH OIL) 1,000 mg Cap Take 1,200 mg by mouth two (2) times a day.       ??? NITROGLYCERIN (NITROTAB SL) 0.4 mg by SubLINGual route.       ???  albuterol-ipratropium (DUO-NEB) 2.5 mg-0.5 mg/3 ml nebulizer solution 3 mL by Nebulization route four (4) times daily.  1 Package  3   ??? HYDROcodone-acetaminophen (NORCO) 5-325 mg per tablet Take 1 Tab by mouth every four (4) hours as needed.           Allergies   Allergen Reactions   ??? Avalide (Irbesartan-Hydrochlorothiazide) Myalgia   ??? Demerol (Meperidine) Unknown (comments)   ??? Pcn (Penicillins) Swelling   ??? Statins-Hmg-Coa Reductase Inhibitors Myalgia   ??? Sulfa (Sulfonamide Antibiotics) Unknown (comments)   ??? Tricor (Fenofibrate Micronized) Myalgia     History     Social History   ??? Marital Status: SINGLE     Spouse  Name: N/A     Number of Children: N/A   ??? Years of Education: N/A     Social History Main Topics   ??? Smoking status: Current Some Day Smoker -- 0.3 packs/day for 45 years     Types: Cigarettes   ??? Smokeless tobacco: Never Used    Comment: passed ready   ??? Alcohol Use: No      may be 3 times a year if that   ??? Drug Use: Not on file   ??? Sexually Active: Not on file     Other Topics Concern   ??? Not on file     Social History Narrative   ??? No narrative on file     Family History   Problem Relation Age of Onset   ??? Lung Disease Mother    ??? Stroke Father        Review of Systems:  Denies dysphagia, chest pain, or SOB.  No nausea or vomiting, has diarrhea alternating with constipation.  No fever, chills, night sweats, some cough.  No dsyuria, has  frequency no abdominal pain. some headaches. - occas. No rashes,  Lots of joint pain . Had carotid dopplers in [facility]  Still trying to stop smoking  Yearly eye exam:   yes  Yearly dental exam: yes    Objective  BP 130/80   Ht 5\' 6"  (1.676 m)   Wt 211 lb (95.709 kg)   BMI 34.06 kg/m2  General appearance - well developed, well nourished 61 y.o.wf  Eyes - PERRL  Left tm- fluid, right tm- intact  Pharynx- poor dentition  Neck --Supple, no anterior cervical nodes, no thyromegaly.bilat bruits  Lungs --CTA  Heart - Regular rate and rhythm 2/6 asm  Abdomen - soft, no  tenderness or distention  Breasts - no masses  Rectal -- hem. negative, brown stool  Extremities - no clubbing, cyanosis or edema, + 1 pulses. 4-5 cm firm cystic are rt leg just distal to knee where vein was harvested    Results for orders placed in visit on 10/01/12   AMB POC URINALYSIS DIP STICK MANUAL W/O MICRO       Component Value Range    Color (UA POC) Yellow  (none)    Clarity (UA POC) Clear  (none)    Glucose (UA POC) Negative  (none)    Bilirubin (UA POC) Negative  (none)    Ketones (UA POC) Negative  (none)    Specific gravity (UA POC) 1.020  1.001 - 1.035    Blood (UA POC) Negative  (none)    pH (UA POC) 5.5  4.6 - 8.0    Protein (UA POC) Negative  Negative mg/dL    Urobilinogen (UA POC) 0.2 mg/dL  0.2 - 1    Nitrites (UA POC) Negative  (none)    Leukocyte esterase (UA POC) Trace  (none)   AMB POC HEMOGLOBIN A1C       Component Value Range    Hemoglobin A1c (POC) 6.6 (*) 4.8 - 5.6 %   AMB POC LIPID PROFILE       Component Value Range    Cholesterol (POC) 182  100 - 199 mg/dL    Triglycerides (POC) 232 (*) 0 - 150 mg/dL    HDL Cholesterol (POC) 66  35 - 150 mg/dL    LDL Cholesterol (POC) 70  0 - 129 mg/dL    Non-HDL Goal (POC) 098      TChol/HDL Ratio (POC) 2.8  0.0 -  5.0 CALC   AMB POC COMPLETE CBC,AUTOMATED ENTER       Component Value Range    WBC (POC) 9.1  4.5 - 10.5 K/uL    RBC (POC) 4.15  4.00 - 6.00 M/uL    HGB (POC) 12.4  12.0 - 16.0 g/dL    HCT (POC) 30.8  65.7 - 60.0 %    MCV (POC) 91.7  80.0 - 99.9 fL    MCH (POC) 29.9  27.0 - 31.0 pg    MCHC (POC) 32.6 (*) 33.0 - 37.0 g/dL    PLATELET (POC) 846  150 - 450 K/uL    LYMPHOCYTES (POC) 26.2  20.5 - 51.1 %    ABS. LYMPHS (POC) 2.4  1.2 - 3.4 K/uL         Assessment/Plan    1. Routine general medical examination at a health care facility  AMB POC URINALYSIS DIP STICK MANUAL W/O MICRO, AMB POC HEMOGLOBIN A1C, AMB POC LIPID PROFILE, AMB POC COMPLETE CBC,AUTOMATED ENTER, PR COLLECTION VENOUS BLOOD,VENIPUNCTURE, METABOLIC PANEL, COMPREHENSIVE, TSH, 3RD  GENERATION   2. Need for prophylactic vaccination and inoculation against influenza  INFLUENZA VIRUS VACCINE, SPLIT, IN INDIVIDS. >=3 YRS OF AGE, IM   3. Mixed hyperlipidemia     4. Coronary atherosclerosis of native coronary artery     5. DM w/o complication type II     6. Dyspnea     7. Mole of skin  REFERRAL TO DERMATOLOGY     Retrieve dopplers done At [facility], f/u 3 months    AUTHOR:  Lake Bells, MD 10/01/2012 1:46 PM

## 2012-10-02 LAB — METABOLIC PANEL, COMPREHENSIVE
A-G Ratio: 2.1 (ref 1.1–2.5)
ALT (SGPT): 27 IU/L (ref 0–32)
AST (SGOT): 24 IU/L (ref 0–40)
Albumin: 4.2 g/dL (ref 3.6–4.8)
Alk. phosphatase: 64 IU/L (ref 47–112)
BUN/Creatinine ratio: 16 (ref 11–26)
BUN: 14 mg/dL (ref 8–27)
Bilirubin, total: 0.2 mg/dL (ref 0.0–1.2)
CO2: 25 mmol/L (ref 19–28)
Calcium: 9.6 mg/dL (ref 8.6–10.2)
Chloride: 97 mmol/L (ref 97–108)
Creatinine: 0.9 mg/dL (ref 0.57–1.00)
GFR est AA: 80 mL/min/{1.73_m2} (ref >59)
GFR est non-AA: 69 mL/min/{1.73_m2} (ref >59)
GLOBULIN, TOTAL: 2 g/dL (ref 1.5–4.5)
Glucose: 158 mg/dL — ABNORMAL HIGH (ref 65–99)
Potassium: 4.7 mmol/L (ref 3.5–5.2)
Protein, total: 6.2 g/dL (ref 6.0–8.5)
Sodium: 140 mmol/L (ref 134–144)

## 2012-10-02 LAB — AMB POC FECAL BLOOD, OCCULT, QL 3 CARDS: Hemoccult (POC): NEGATIVE

## 2012-10-02 LAB — TSH 3RD GENERATION: TSH: 1.54 u[IU]/mL (ref 0.450–4.500)

## 2012-10-15 ENCOUNTER — Encounter

## 2012-12-01 LAB — AMB POC LIPID PROFILE
Cholesterol (POC): 124 mg/dL (ref 100–199)
HDL Cholesterol (POC): 50 mg/dL (ref 35–150)
LDL Cholesterol (POC): 35 mg/dL (ref 0–129)
Non-HDL Goal (POC): 74
TChol/HDL Ratio (POC): 2.5 CALC (ref 0.0–5.0)
Triglycerides (POC): 195 mg/dL — AB (ref 0–150)

## 2012-12-01 NOTE — Telephone Encounter (Signed)
pls call and have them send over PA paperwork

## 2012-12-01 NOTE — Telephone Encounter (Signed)
Please order different drug

## 2012-12-01 NOTE — Telephone Encounter (Signed)
Pharmacy will fax requesting PA

## 2012-12-01 NOTE — Telephone Encounter (Signed)
Pharmacist  is calling from [facility] about Kelsey [name]. Her Protonix is not covered by insurance. Can attempt PA or change to some thing else. His Ph # is [phone].

## 2012-12-01 NOTE — Progress Notes (Signed)
Kelsey [name] is a 61 y.o. female and presents with   Chief Complaint   Patient presents with   ??? Follow-up   .  Arthritis is killing her. Omeprazole not controlling reflux.  Had none on protonix.  Has to add tums at bedtime.  Blood sugars in am under 130. occas as high as 180. Has been on zetia for the last 6 weeks. Cards wants lipids checked. She had a poptart for breakfast this am.      Current Outpatient Prescriptions   Medication Sig Dispense Refill   ??? omeprazole (PRILOSEC) 40 mg capsule Take 40 mg by mouth daily.       ??? ezetimibe (ZETIA) 10 mg tablet Take  by mouth.       ??? gabapentin (NEURONTIN) 100 mg capsule Take  by mouth three (3) times daily.       ??? pantoprazole (PROTONIX) 40 mg tablet Take 1 Tab by mouth daily.  90 Tab  3   ??? diphenhydrAMINE (BENADRYL) 25 mg capsule Take 25 mg by mouth every six (6) hours as needed.       ??? Ferrous Fumarate 325 mg (106 mg iron) Tab Take  by mouth.       ??? CHANTIX 1 mg tablet USE AS DIRECTED  56 Tab  2   ??? CYMBALTA 60 mg capsule TAKE ONE CAPSULE BY MOUTH EVERY DAY  90 Cap  2   ??? aspirin (ASPIRIN) 325 mg tablet Take 325 mg by mouth daily.       ??? insulin detemir (LEVEMIR) 100 unit/mL injection by SubCUTAneous route nightly. Inject 15 units at bedtime       ??? carvedilol (COREG) 6.25 mg tablet Take 6.25 mg by mouth daily.       ??? NITROSTAT 0.4 mg SL tablet TAKE 1 TABLET SL EVERY 5 MINUTES AS NEEDED FOR CHEST PAIN AS DIRECTED  50 Tab  1   ??? bumetanide (BUMEX) 2 mg tablet Take 2 mg by mouth daily.       ??? pravastatin (PRAVACHOL) 40 mg tablet Take 40 mg by mouth nightly.       ??? insulin lispro (HUMALOG) 100 unit/mL kwikpen Inject 2-10 units three times daily  1 Package  11   ??? metFORMIN ER (GLUCOPHAGE XR) 500 mg tablet TAKE 5 TABLETS BY MOUTH DAILY  450 Tab  1   ??? HYDROcodone-acetaminophen (NORCO) 5-325 mg per tablet Take 1 Tab by mouth every four (4) hours as needed.         ??? methocarbamol (ROBAXIN) 500 mg tablet TAKE 2 TABLETS 4 TIMES A DAY  100 Tab  0   ???  Cholecalciferol, Vitamin D3, (VITAMIN D3) 1,000 unit Cap Take 5,000 Caps by mouth daily.         ??? omega-3 fatty acids-vitamin e (FISH OIL) 1,000 mg Cap Take 1,200 mg by mouth two (2) times a day.       ??? NITROGLYCERIN (NITROTAB SL) 0.4 mg by SubLINGual route.         No current facility-administered medications for this visit.     Allergies   Allergen Reactions   ??? Avalide (Irbesartan-Hydrochlorothiazide) Myalgia   ??? Demerol (Meperidine) Unknown (comments)   ??? Pcn (Penicillins) Swelling   ??? Statins-Hmg-Coa Reductase Inhibitors Myalgia   ??? Sulfa (Sulfonamide Antibiotics) Unknown (comments)   ??? Tricor (Fenofibrate Micronized) Myalgia     Past Medical History   Diagnosis Date   ??? Hypertension    ??? CAD (coronary artery  disease)    ??? Chronic pain    ??? Arthritis    ??? COPD    ??? Diabetes    ??? Goiter 04/20/2012     Past Surgical History   Procedure Laterality Date   ??? Hx orthopaedic       rt knee   ??? Pr cardiac surg procedure unlist       PTCA   ??? Hx other surgical       ganglion cust removed R     Family History   Problem Relation Age of Onset   ??? Lung Disease Mother    ??? Stroke Father      History   Substance Use Topics   ??? Smoking status: Current Some Day Smoker -- 0.30 packs/day for 45 years     Types: Cigarettes   ??? Smokeless tobacco: Never Used      Comment: passed ready   ??? Alcohol Use: No      Comment: may be 3 times a year if that          Objective:  BP 140/70   Ht 5\' 6"  (1.676 m)   Wt 214 lb (97.07 kg)   BMI 34.56 kg/m2  wdwn 62 yo wf  In NAD. A&O.  HEENT -- Pupils round.  O/P Clear.  Neck -- Supple. No JVD.  Heart -- RRR. No R/M/G.  Lungs -- Coarse breath sounds  Abdomen -- Soft. Non-tender. Non-distended. No masses. Bowel sounds present.  Extremities -- No edema.+1 pulses      Results for orders placed in visit on 12/01/12   AMB POC LIPID PROFILE       Result Value Range    Cholesterol (POC) 124  100 - 199 mg/dL    Triglycerides (POC) 195 (*) 0 - 150 mg/dL    HDL Cholesterol (POC) 50  35 - 150 mg/dL    LDL  Cholesterol (POC) 35  0 - 129 mg/dL    Non-HDL Goal (POC) 74      TChol/HDL Ratio (POC) 2.5  0.0 - 5.0 CALC         Assessment/Plan:  1. Mixed hyperlipidemia  AMB POC LIPID PROFILE   2. GERD (gastroesophageal reflux disease)  pantoprazole (PROTONIX) 40 mg tablet     Will attempt PA for protonix again      Author:  Lake Bells, MD 12/01/2012 11:26 AM

## 2012-12-01 NOTE — Patient Instructions (Addendum)
Results for orders placed in visit on 12/01/12   AMB POC LIPID PROFILE       Result Value Range    Cholesterol (POC) 124  100 - 199 mg/dL    Triglycerides (POC) 195 (*) 0 - 150 mg/dL    HDL Cholesterol (POC) 50  35 - 150 mg/dL    LDL Cholesterol (POC) 35  0 - 129 mg/dL    Non-HDL Goal (POC) 74      TChol/HDL Ratio (POC) 2.5  0.0 - 5.0 CALC

## 2012-12-17 LAB — CREATININE
Creatinine: 0.65 MG/DL (ref 0.45–1.15)
GFR est AA: >60 mL/min/{1.73_m2} (ref >60)
GFR est non-AA: >60 mL/min/{1.73_m2} (ref >60)

## 2012-12-17 LAB — HEMOGLOBIN: HGB: 12.3 g/dL (ref 11.5–16.0)

## 2012-12-17 LAB — EKG, 12 LEAD, INITIAL
Atrial Rate: 78 {beats}/min
Calculated P Axis: 76 degrees
Calculated R Axis: -72 degrees
Calculated T Axis: 50 degrees
Diagnosis: NORMAL
P-R Interval: 188 ms
Q-T Interval: 450 ms
QRS Duration: 138 ms
QTC Calculation (Bezet): 513 ms
Ventricular Rate: 78 {beats}/min

## 2012-12-17 LAB — POTASSIUM: Potassium: 4.4 MMOL/L (ref 3.5–5.1)

## 2012-12-17 NOTE — Telephone Encounter (Signed)
i sent in rx for protonix on 12/ ll [facility] and never got paperwork from pharmacy to do prior auth.  So i have called and talked to pharmacist and she said she would initiate that.- pls call Kelsey [name].  If she does not hear anything in a week call again.  There are multiple areas of breakdown in this system., but I did what i said i would do- and that is send in the rx.

## 2012-12-17 NOTE — Telephone Encounter (Signed)
Message copied by Sudie Grumbling on Fri Dec 17, 2012  3:32 PM  ------       Message from: [place], [place] R       Created: Fri Dec 17, 2012 12:10 PM       Contact: [phone]         Needs to speak with about PA and that the doctor was suppose to call the insurance company   ------

## 2012-12-17 NOTE — Telephone Encounter (Signed)
Message copied by Sudie Grumbling on Fri Dec 17, 2012  3:28 PM  ------       Message from: [place], [place] R       Created: Fri Dec 17, 2012 12:10 PM       Contact: [phone]         Needs to speak with about PA and that the doctor was suppose to call the insurance company   ------

## 2012-12-17 NOTE — Other (Signed)
PATIENT GIVEN SURGICAL SITE INFECTION FAQS HANDOUT, DISCUSSED IMPORTANCE OF GOOD HAND HYGIENE, PATIENT VERBALIZED UNDERSTANDING.

## 2012-12-17 NOTE — Telephone Encounter (Signed)
Has tried Omeprazole 40 mg and not working well, what else can she try?  Please order/advise

## 2012-12-17 NOTE — Telephone Encounter (Signed)
Spoke with Patient Omeprazole 40 mg is not working well, would like to try something else, Please advise/order

## 2012-12-20 NOTE — Other (Signed)
Spoke with Dr.Wood via phone about pt's history and most recent Ekg.  Pt had Cabg, stents , and AVR in 6/13. Ok to proceed.

## 2012-12-21 NOTE — H&P (Signed)
HISTORY OF PRESENT ILLNESS:  Kelsey [name] comes in today for follow up of her right shoulder MRI.  Her pain never really got all the way better and so she comes in after the MRI to get it checked out.  Pain is localized to the lateral shoulder.  It hurts her overhead and crossbody adduction and comes in to get it checked.    PAST MEDICAL HISTORY:  The patient???s past medical history is reviewed and is listed on the patient information sheet which was compared to the history sheet from 11/10/12 and is unchanged.    REVIEW OF SYSTEMS:  Significant only for the shoulder pain.  The patient denies any other positive findings.  Specifically denies any fevers, chills, nausea or vomiting.  Denies any redness or erythema.  Denies any numbness or tingling.    PHYSICAL EXAMINATION: She is healthy, well-developed, well-nourished and in no apparent distress.  Alert and oriented x3.  Answers all questions appropriately.     Examination of the shoulder shows she is 5???6", 32 lb, 61 years old.  Shoulder exam shows she has no rashes, skin changes, bruising, erythema or deformity.  She has good range of motion actively and passively with pain on forward flexion, abduction and internal and external rotation.  She has good biceps and triceps strength.  Rotator cuff strength is weak in forward flexion and in crossbody adduction.  She has good pulses, good sensation, good capillary refill and no edema.     MRI of the shoulder is reviewed and shows a type II acromion, AC arthritis and rotator cuff tear.    ASSESSMENT:  She has a rotator cuff tear.    PLAN:  We are going to set her up for a shoulder arthroscopy and rotator cuff repair.  This will be per her request and her convenience and get it done as soon as possible.  This will be a subacromial decompression, distal clavicle excision.  Hopefully she will do well and I will see her back after it is completed and go from there.    Julious P. Leia Alf, M.D. Sherril Croon    D: 12/06/12  T:   12/08/12          Electronically signed [id number]  Leia Alf  MD   Dec 08 2012 11:42AM EST

## 2012-12-23 ENCOUNTER — Inpatient Hospital Stay: Payer: BLUE CROSS/BLUE SHIELD

## 2012-12-23 LAB — GLUCOSE, POC
Glucose (POC): 202 mg/dL — ABNORMAL HIGH (ref 65–105)
Glucose (POC): 217 mg/dL — ABNORMAL HIGH (ref 65–105)

## 2012-12-23 MED ADMIN — propofol (DIPRIVAN) 10 mg/mL injection: INTRAVENOUS | @ 14:00:00 | NDC 63323026950

## 2012-12-23 MED ADMIN — fentaNYL citrate (PF) injection 25 mcg: INTRAVENOUS | @ 17:00:00 | NDC 00409909332

## 2012-12-23 MED ADMIN — acetaminophen (OFIRMEV) infusion: INTRAVENOUS | @ 14:00:00 | NDC 43825010201

## 2012-12-23 MED ADMIN — oxyCODONE IR (ROXICODONE) tablet 5 mg: ORAL | @ 18:00:00 | NDC 00378611201

## 2012-12-23 MED ADMIN — EPINEPHrine (PF) 1 mL in lactated ringers 3,000 mL Irrigation: @ 14:00:00 | NDC 00409795309

## 2012-12-23 MED ADMIN — levofloxacin (LEVAQUIN) 750 mg in D5W IVPB: INTRAVENOUS | @ 14:00:00 | NDC 00045006601

## 2012-12-23 MED ADMIN — HYDROmorphone (PF) (DILAUDID) injection 0.2 mg: INTRAVENOUS | @ 18:00:00 | NDC 00409255201

## 2012-12-23 MED ADMIN — midazolam (VERSED) injection 1 mg: INTRAVENOUS | @ 13:00:00 | NDC 63323041125

## 2012-12-23 MED ADMIN — lactated ringers infusion: INTRAVENOUS | @ 13:00:00 | NDC 00409795309

## 2012-12-23 MED ADMIN — labetalol (NORMODYNE;TRANDATE) injection 5 mg: INTRAVENOUS | @ 18:00:00 | NDC 00409226720

## 2012-12-23 MED ADMIN — labetalol (NORMODYNE;TRANDATE) injection 5 mg: INTRAVENOUS | @ 17:00:00 | NDC 00409226720

## 2012-12-23 MED ADMIN — labetalol (NORMODYNE;TRANDATE) injection: INTRAVENOUS | @ 15:00:00 | NDC 17478042020

## 2012-12-23 MED ADMIN — PHENYLephrine (NEOSYNEPHRINE) 10 mg in 0.9% sodium chloride 250 mL infusion: INTRAVENOUS | @ 15:00:00 | NDC 10019016339

## 2012-12-23 MED ADMIN — lidocaine (PF) (XYLOCAINE) 20 mg/mL (2 %) injection: INTRAVENOUS | @ 14:00:00 | NDC 63323049505

## 2012-12-23 MED ADMIN — propofol (DIPRIVAN) 10 mg/mL injection: INTRAVENOUS | @ 15:00:00 | NDC 63323026950

## 2012-12-23 MED ADMIN — HYDROmorphone (PF) (DILAUDID) injection 0.2 mg: INTRAVENOUS | @ 19:00:00 | NDC 00409255201

## 2012-12-23 MED ADMIN — lactated ringers infusion: INTRAVENOUS | @ 14:00:00 | NDC 00338011704

## 2012-12-23 MED ADMIN — rocuronium (ZEMURON) injection: INTRAVENOUS | @ 14:00:00 | NDC 10139023505

## 2012-12-23 MED ADMIN — fentaNYL citrate (PF) injection 50 mcg: INTRAVENOUS | @ 13:00:00 | NDC 00409909332

## 2012-12-23 MED ADMIN — succinylcholine (ANECTINE) injection: INTRAVENOUS | @ 14:00:00 | NDC 00409662902

## 2012-12-23 MED ADMIN — HYDROmorphone (PF) (DILAUDID) injection 0.2 mg: INTRAVENOUS | @ 17:00:00 | NDC 00409255201

## 2012-12-23 MED ADMIN — lactated ringers infusion: INTRAVENOUS | @ 15:00:00 | NDC 00338011704

## 2012-12-23 MED FILL — FENTANYL CITRATE (PF) 50 MCG/ML IJ SOLN: 50 mcg/mL | INTRAMUSCULAR | Qty: 2

## 2012-12-23 MED FILL — OFIRMEV 1,000 MG/100 ML (10 MG/ML) INTRAVENOUS SOLUTION: 1000 mg/100 mL (10 mg/mL) | INTRAVENOUS | Qty: 100

## 2012-12-23 MED FILL — BD POSIFLUSH NORMAL SALINE 0.9 % INJECTION SYRINGE: INTRAMUSCULAR | Qty: 10

## 2012-12-23 MED FILL — ROCURONIUM 10 MG/ML IV: 10 mg/mL | INTRAVENOUS | Qty: 30

## 2012-12-23 MED FILL — LEVAQUIN 750 MG/150 ML IN 5 % DEXTROSE INTRAVENOUS PIGGYBACK: 750 mg/150 mL | INTRAVENOUS | Qty: 150

## 2012-12-23 MED FILL — DIPRIVAN 10 MG/ML INTRAVENOUS EMULSION: 10 mg/mL | INTRAVENOUS | Qty: 300

## 2012-12-23 MED FILL — EPINEPHRINE (PF) 1 MG/ML INJECTION: 1 mg/mL ( mL) | INTRAMUSCULAR | Qty: 4

## 2012-12-23 MED FILL — DILAUDID (PF) 2 MG/ML INJECTION SOLUTION: 2 mg/mL | INTRAMUSCULAR | Qty: 1

## 2012-12-23 MED FILL — LACTATED RINGERS IV: INTRAVENOUS | Qty: 1000

## 2012-12-23 MED FILL — HYDROMORPHONE (PF) 1 MG/ML IJ SOLN: 1 mg/mL | INTRAMUSCULAR | Qty: 1

## 2012-12-23 MED FILL — SODIUM CHLORIDE 0.9 % IV: INTRAVENOUS | Qty: 1000

## 2012-12-23 MED FILL — PHENYLEPHRINE IN 0.9 % SODIUM CL (40 MCG/ML) IV SYRINGE: 0.4 mg/10 mL (40 mcg/mL) | INTRAVENOUS | Qty: 240

## 2012-12-23 MED FILL — OXYCODONE 5 MG TAB: 5 mg | ORAL | Qty: 1

## 2012-12-23 MED FILL — XYLOCAINE-MPF 10 MG/ML (1 %) INJECTION SOLUTION: 10 mg/mL (1 %) | INTRAMUSCULAR | Qty: 5

## 2012-12-23 MED FILL — MIDAZOLAM 1 MG/ML IJ SOLN: 1 mg/mL | INTRAMUSCULAR | Qty: 5

## 2012-12-23 MED FILL — XYLOCAINE-MPF 20 MG/ML (2 %) INJECTION SOLUTION: 20 mg/mL (2 %) | INTRAMUSCULAR | Qty: 80

## 2012-12-23 MED FILL — LABETALOL 5 MG/ML IV SOLN: 5 mg/mL | INTRAVENOUS | Qty: 20

## 2012-12-23 MED FILL — NAROPIN (PF) 5 MG/ML (0.5 %) INJECTION SOLUTION: 5 mg/mL (0. %) | INTRAMUSCULAR | Qty: 30

## 2012-12-23 NOTE — Anesthesia Procedure Notes (Signed)
Peripheral Block     interscalene  Reason for block: at surgeon's request and post-op pain management  Staffing  Anesthesiologist: Harjas Biggins R  Performed by: anesthesiologist   Prep  Risks and benefits discussed with the patient and plans are to proceed  Site marked, Timeout performed  Monitoring: standard ASA monitoring, continuous pulse ox, frequent vital sign checks, oxygen, responsive to questions and heart rate  Injection Technique: single-shot  Procedures: ultrasound guided and nerve stimulator  Patient was placed in supine position  Prep Solution(s): DuraPrep  Region: interscalene  Needle  Needle: 22G Stimuplex  Needle localization: nerve stimulator, ultrasound guidance and anatomical landmarks  Minimal motor response <0.5 mA and >0.3 mA  Medication Injected: 30mL 0.5% ropivacaine    Assessment  Injection Assessment: incremental injection every 5 mL, local visualized surrounding nerve on ultrasound, negative aspiration for CSF, negative aspiration for blood, no paresthesia and no intravascular symptoms  Patient tolerated without any apparent complications  Additional Notes  Risks, benefits, alternatives explained at length and patient agrees to proceed.  Time out performed and site for block/surgery identified.  Standard monitors applied, 3 L NC O2, and sedation given as recorded by RN so as to achieve patient comfort and anxiolysis, but maintain meaningful verbal contact.  Sterile prep followed by a 40 mm, 22G insulated stimiplex needle was inserted into the groove between the anterior and middle scalene muscles until twitches of the deltoid, biceps, triceps, and/or forearm were noted at <0.5mA but >0.3mA.      30 ml 0.5%ropivacaine was injected without resistance and with gentle aspiration every 3-5 ml.  Sterile throughout.      No pain, paresthesias, or blood were noted.  No complications.  Patient without complaints.  VSS throughout.

## 2012-12-23 NOTE — Anesthesia Pre-Procedure Evaluation (Addendum)
Anesthetic History   No history of anesthetic complications           Review of Systems / Medical History  Patient summary reviewed, nursing notes reviewed and pertinent labs reviewed    Pulmonary    COPD      Asthma and smoker       Neuro/Psych         Psychiatric history     Cardiovascular    Hypertension  Valvular problems/murmurs      CABG/stent and dysrhythmias    Exercise tolerance: >4 METS     GI/Hepatic/Renal     GERD: well controlled             Endo/Other    Diabetes    Obesity and arthritis     Other Findings            Physical Exam    Airway  Mallampati: II  TM Distance: > 6 cm  Neck ROM: normal range of motion   Mouth opening: Normal     Cardiovascular  Regular rate and rhythm,  S1 and S2 normal,  no murmur, click, rub, or gallop             Dental    Dentition: Full upper dentures     Pulmonary      Decreased breath sounds      Prolonged expiration     Abdominal  GI exam deferred       Other Findings            Anesthetic Plan    ASA: 3  Anesthesia type: general      Post-op pain plan if not by surgeon: peripheral nerve block single    Induction: Intravenous  Anesthetic plan and risks discussed with: Patient

## 2012-12-23 NOTE — Anesthesia Post-Procedure Evaluation (Signed)
Post-Anesthesia Evaluation and Assessment    Patient: Kelsey [name] MRN: [id number]  SSN: [id number]    Date of Birth: January 02, 1951  Age: 62 y.o.  Sex: female       Cardiovascular Function/Vital Signs  Visit Vitals   Item Reading   ??? BP 148/85   ??? Pulse 72   ??? Temp 36.7 ??C (98.1 ??F)   ??? Resp 17   ??? Ht 5' 5.5" (1.664 m)   ??? Wt 97.5 kg (214 lb 15.2 oz)   ??? BMI 35.21 kg/m2   ??? SpO2 100%       Patient is status post General anesthesia for Procedure(s):  RIGHT SHOULDER ARTHROSCOPY/SUBACROMIAL DECOMPRESSION/ROTATOR CUFF REPAIR/DISTAL CLAVICLE EXCISION.    Nausea/Vomiting: None    Postoperative hydration reviewed and adequate.    Pain:  Pain Scale 1: Visual (12/23/12 1140)  Pain Intensity 1: 7 (12/23/12 1140)   Managed    Neurological Status:   Neuro (WDL): Within Defined Limits (12/23/12 0847)   Block resolving    Mental Status and Level of Consciousness: Alert and oriented     Pulmonary Status:   O2 Device: 02 face tent (12/23/12 1103)   Adequate oxygenation and airway patent    Complications related to anesthesia: None    Post-anesthesia assessment completed. No concerns    Signed By: Glenard Haring. Jillianna Stanek, DO     December 23, 2012

## 2012-12-23 NOTE — Anesthesia Procedure Notes (Signed)
Peripheral Block     interscalene  Reason for block: at surgeon's request and post-op pain management  Staffing  Anesthesiologist: Shailah Gibbins R  Performed by: anesthesiologist   Prep  Risks and benefits discussed with the patient and plans are to proceed  Site marked, Timeout performed  Monitoring: standard ASA monitoring, continuous pulse ox, frequent vital sign checks, oxygen, responsive to questions and heart rate  Injection Technique: single-shot  Procedures: ultrasound guided and nerve stimulator  Patient was placed in supine position  Prep Solution(s): DuraPrep  Region: interscalene  Needle  Needle: 22G Stimuplex  Needle localization: nerve stimulator, ultrasound guidance and anatomical landmarks  Minimal motor response <0.5 mA and >0.3 mA  Medication Injected: 30mL 0.5% ropivacaine    Assessment  Injection Assessment: incremental injection every 5 mL, local visualized surrounding nerve on ultrasound, negative aspiration for CSF, negative aspiration for blood, no paresthesia and no intravascular symptoms  Patient tolerated without any apparent complications  Additional Notes  Risks, benefits, alternatives explained at length and patient agrees to proceed.  Time out performed and site for block/surgery identified.  Standard monitors applied, 3 L NC O2, and sedation given as recorded by RN so as to achieve patient comfort and anxiolysis, but maintain meaningful verbal contact.  Sterile prep followed by a 40 mm, 22G insulated stimiplex needle was inserted into the groove between the anterior and middle scalene muscles until twitches of the deltoid, biceps, triceps, and/or forearm were noted at <0.5mA but >0.3mA.      30 ml 0.5%ropivacaine was injected without resistance and with gentle aspiration every 3-5 ml.  Sterile throughout.      No pain, paresthesias, or blood were noted.  No complications.  Patient without complaints.  VSS throughout.

## 2012-12-23 NOTE — Other (Signed)
Allergies reviewed with Dr Alvina Filbert.  Labetalol 5 mg given for elevated blood pressure.

## 2012-12-28 NOTE — Telephone Encounter (Signed)
Message copied by Sudie Grumbling on Tue Dec 28, 2012 12:36 PM  ------       Message from: [place]       Created: Tue Dec 28, 2012 11:57 AM       Regarding: speak to nurse       Contact: [phone]         MAHAYLA HADDAWAY is calling to speak to nurse.       # [phone]  ------

## 2012-12-28 NOTE — Telephone Encounter (Signed)
Called patient and relayed doctor's message

## 2013-01-02 NOTE — Op Note (Signed)
Shoulder Arthroscopy Operative Note      Patient: Kelsey [name] MRN: [id number]  SSN: [id number]    Date of Birth: December 04, 1951  Age: 62 y.o.  Sex: female        Date of Surgery: 12/23/2012    Preoperative Diagnosis:   RIGHT SHOULDER ROTATOR CUFF TEAR    Postoperative Diagnosis:   right Shoulder Impingement, AC arthritis, and Rotator cuff tear.    Procedures:  1.right Shoulder Scope and Sub-Acromial Decompression (78295)  2. Arthroscopic Distal Clavicle Excision (62130)  3. Arthroscopic Rotator Cuff Repair (86578)    Surgeon(s) and Role:     * Julious(Jody) Elige Radon, MD - Primary     Assistant: Chales Salmon, PA-C    Anesthesia: General    Pathology: Impingement and AC Arthritis    Estimated Blood Loss:  MIN ml      Specimens: * No specimens in log *     Complications: None     Hospital Problems Date Reviewed: 12/23/2012    None          Indications: The patient is a 62 y.o. female who has right shoulder impingement and AC arthritis and a rotator cuff tear.The patient has exhausted nonoperative modalities and is electively admitted for outpatient right shoulder arthroscopy.    PROCEDURE IN DETAIL: After the procedure was explained to the patient, including the risks, benefits and possible complications, the patient signed the informed consent.  The patient was then taken to the operating suite. Following administration of general anesthesia and interscalene block for postoperative pain control and infusion of intravenous antibiotic, the patient was positioned on the operating table in the supine fashion. The  right  shoulder was then examined under anesthesia and noted to be stable through full range of motion. At this point, the patient was then carefully positioned in the lateral decubitus position, left side down. The axillary roll was placed. Care was taken to pad both the upper and lower extremities. The right arm was then placed in the [employer]yon[employer] traction device in 45 degrees abduction and 30 degrees forward flexion with  10 pounds of traction. The right shoulder was then prepped and draped in the sterile fashion. The scope was introduced into the shoulder and diagnostic arthroscopy commenced. Articular surfaces of the humeral head and glenoid were visualized and noted to be MILDLY DEGENERATIVE. The anterior, posterior, superior and inferior labrum were visualized. There was no significant fraying.  The biceps anchor and the biceps itself was normal. The undersurface of the rotator cuff was then visualized. There was a large tear of the anterior supraspinatus tendon.  The scope was introduced into the subacromial space. An anterior portal was created for inflow and  lateral portal was established for debridement using a spinal needle for localization. Hypertrophic hemorrhagic bursal tissue was then resected. The bursal side of the cuff was visualized and was torn as noted from below. The underside of the acromion was identified and denuded of all soft tissue. An acromioplasty was then performed from the posterior portal using a helicut burr with the cutting block technique. At this point the distal clavicle was identified and an arthroscopic distal clavicle resection was then performed. The distal 10mm of distal clavicle was then resected. Care was taken to preserve the posterior/superior capsule. The scope was introduced back into the subacromial space.  An arthroscopic rotator cuff repair was then performed utilizing 2 x 5.5 arthrex anchors. The 5.5 mm anchors were placed laterally and one limb of each suture  was placed through the lateral cuff in simple fashion. All of the sutures were tied with rodeur knots to appose the cuff to the tuberosity. This yielded an excellent cuff repair on the bursal side.  At this point, with the procedure complete, all arthroscopic equipment was removed from the shoulder. The portals were reapproximated using 2-0 nylon sutures.  A sterile dressing was applied. The Sling/immobilzer was applied. The  patient was then transferred to the Recovery Room in stable condition.    Signed: Julious P. Katrinka Blazing III MD (12/23/2012 at 9:56 PM)

## 2013-01-02 NOTE — Op Note (Signed)
Shoulder Arthroscopy Operative Note      Patient: Kelsey [name] MRN: [id number]  SSN: [id number]    Date of Birth: 11/27/1951  Age: 61 y.o.  Sex: female        Date of Surgery: 12/23/2012    Preoperative Diagnosis:   RIGHT SHOULDER ROTATOR CUFF TEAR    Postoperative Diagnosis:   right Shoulder Impingement, AC arthritis, and Rotator cuff tear.    Procedures:  1.right Shoulder Scope and Sub-Acromial Decompression (29826)  2. Arthroscopic Distal Clavicle Excision (29824)  3. Arthroscopic Rotator Cuff Repair (29827)    Surgeon(s) and Role:     * Julious(Jody) P Wadie Liew, MD - Primary     Assistant: SUSAN KEWER, PA-C    Anesthesia: General    Pathology: Impingement and AC Arthritis    Estimated Blood Loss:  MIN ml      Specimens: * No specimens in log *     Complications: None     Hospital Problems Date Reviewed: 12/23/2012    None          Indications: The patient is a 61 y.o. female who has right shoulder impingement and AC arthritis and a rotator cuff tear.The patient has exhausted nonoperative modalities and is electively admitted for outpatient right shoulder arthroscopy.    PROCEDURE IN DETAIL: After the procedure was explained to the patient, including the risks, benefits and possible complications, the patient signed the informed consent.  The patient was then taken to the operating suite. Following administration of general anesthesia and interscalene block for postoperative pain control and infusion of intravenous antibiotic, the patient was positioned on the operating table in the supine fashion. The  right  shoulder was then examined under anesthesia and noted to be stable through full range of motion. At this point, the patient was then carefully positioned in the lateral decubitus position, left side down. The axillary roll was placed. Care was taken to pad both the upper and lower extremities. The right arm was then placed in the Dyon[employer] traction device in 45 degrees abduction and 30 degrees forward flexion with  10 pounds of traction. The right shoulder was then prepped and draped in the sterile fashion. The scope was introduced into the shoulder and diagnostic arthroscopy commenced. Articular surfaces of the humeral head and glenoid were visualized and noted to be MILDLY DEGENERATIVE. The anterior, posterior, superior and inferior labrum were visualized. There was no significant fraying.  The biceps anchor and the biceps itself was normal. The undersurface of the rotator cuff was then visualized. There was a large tear of the anterior supraspinatus tendon.  The scope was introduced into the subacromial space. An anterior portal was created for inflow and  lateral portal was established for debridement using a spinal needle for localization. Hypertrophic hemorrhagic bursal tissue was then resected. The bursal side of the cuff was visualized and was torn as noted from below. The underside of the acromion was identified and denuded of all soft tissue. An acromioplasty was then performed from the posterior portal using a helicut burr with the cutting block technique. At this point the distal clavicle was identified and an arthroscopic distal clavicle resection was then performed. The distal 10mm of distal clavicle was then resected. Care was taken to preserve the posterior/superior capsule. The scope was introduced back into the subacromial space.  An arthroscopic rotator cuff repair was then performed utilizing 2 x 5.5 arthrex anchors. The 5.5 mm anchors were placed laterally and one limb of each suture   was placed through the lateral cuff in simple fashion. All of the sutures were tied with rodeur knots to appose the cuff to the tuberosity. This yielded an excellent cuff repair on the bursal side.  At this point, with the procedure complete, all arthroscopic equipment was removed from the shoulder. The portals were reapproximated using 2-0 nylon sutures.  A sterile dressing was applied. The Sling/immobilzer was applied. The  patient was then transferred to the Recovery Room in stable condition.    Signed: Julious P. Mikinzie Maciejewski III MD (12/23/2012 at 9:56 PM)

## 2013-01-26 LAB — AMB POC URINALYSIS DIP STICK MANUAL W/O MICRO
Bilirubin (UA POC): NEGATIVE
Blood (UA POC): NEGATIVE
Glucose (UA POC): NEGATIVE
Ketones (UA POC): NEGATIVE
Leukocyte esterase (UA POC): NEGATIVE
Nitrites (UA POC): NEGATIVE
Protein (UA POC): NEGATIVE mg/dL
Specific gravity (UA POC): 1.025 (ref 1.001–1.035)
Urobilinogen (UA POC): 0.2 (ref 0.2–1)
pH (UA POC): 5.5 (ref 4.6–8.0)

## 2013-01-26 LAB — AMB POC COMPLETE CBC,AUTOMATED ENTER
ABS. LYMPHS (POC): 2.4 10*3/uL (ref 1.2–3.4)
HCT (POC): 40.3 % (ref 35.0–60.0)
HGB (POC): 13.3 g/dL (ref 12.0–16.0)
LYMPHOCYTES (POC): 30.6 % (ref 20.5–51.1)
MCH (POC): 30.6 pg (ref 27.0–31.0)
MCHC (POC): 33.1 g/dL (ref 33.0–37.0)
MCV (POC): 92.4 fL (ref 80.0–99.9)
PLATELET (POC): 226 10*3/uL (ref 150–450)
RBC (POC): 4.36 M/uL (ref 4.00–6.00)
WBC (POC): 7.9 10*3/uL (ref 4.5–10.5)

## 2013-01-26 LAB — AMB POC LIPID PROFILE
Cholesterol (POC): 139 mg/dL (ref 100–199)
HDL Cholesterol (POC): 48 mg/dL (ref 35–150)
LDL Cholesterol (POC): 26 mg/dL (ref 0–129)
Non-HDL Goal (POC): 91
TChol/HDL Ratio (POC): 2.9 CALC (ref 0.0–5.0)
Triglycerides (POC): 325 mg/dL — AB (ref 0–150)

## 2013-01-26 LAB — AMB POC HEMOGLOBIN A1C: Hemoglobin A1c (POC): 6.5 % — AB (ref 4.8–5.6)

## 2013-01-26 NOTE — Patient Instructions (Addendum)
Results for orders placed in visit on 01/26/13   AMB POC URINALYSIS DIP STICK MANUAL W/O MICRO       Result Value Range    Color (UA POC) Yellow      Clarity (UA POC) Clear      Glucose (UA POC) Negative  Negative    Bilirubin (UA POC) Negative  Negative    Ketones (UA POC) Negative  Negative    Specific gravity (UA POC) 1.025  1.001 - 1.035    Blood (UA POC) Negative  Negative    pH (UA POC) 5.5  4.6 - 8.0    Protein (UA POC) Negative  Negative mg/dL    Urobilinogen (UA POC) 0.2 mg/dL  0.2 - 1    Nitrites (UA POC) Negative  Negative    Leukocyte esterase (UA POC) Negative  Negative   AMB POC HEMOGLOBIN A1C       Result Value Range    Hemoglobin A1c (POC) 6.5 (*) 4.8 - 5.6 %   AMB POC COMPLETE CBC,AUTOMATED ENTER       Result Value Range    WBC (POC) 7.9  4.5 - 10.5 K/uL    RBC (POC) 4.36  4.00 - 6.00 M/uL    HGB (POC) 13.3  12.0 - 16.0 g/dL    HCT (POC) 78.2  95.6 - 60.0 %    MCV (POC) 92.4  80.0 - 99.9 fL    MCH (POC) 30.6  27.0 - 31.0 pg    MCHC (POC) 33.1  33.0 - 37.0 g/dL    PLATELET (POC) 213  150 - 450 K/uL    LYMPHOCYTES (POC) 30.6  20.5 - 51.1 %    ABS. LYMPHS (POC) 2.4  1.2 - 3.4 K/uL   AMB POC LIPID PROFILE       Result Value Range    Cholesterol (POC) 139  100 - 199 mg/dL    Triglycerides (POC) 325 (*) 0 - 150 mg/dL    HDL Cholesterol (POC) 48  35 - 150 mg/dL    LDL Cholesterol (POC) 26  0 - 129 mg/dL    Non-HDL Goal (POC) 91      TChol/HDL Ratio (POC) 2.9  0.0 - 5.0 CALC

## 2013-01-26 NOTE — Progress Notes (Signed)
Kelsey [name] is a 62 y.o. female and presents with   Chief Complaint   Patient presents with   ??? Follow-up     1 mo   .  Blood sugars erratic, shoulder pain ok with percocet- still taking twice a day.Marland Kitchen gerd improved with protonix,  Denies chest pain or sob. Has not been exercising due to shoulder surgery.  Going to [place] soon,      Current Outpatient Prescriptions   Medication Sig Dispense Refill   ??? CHANTIX 1 mg tablet USE AS DIRECTED  56 Tab  2   ??? oxyCODONE-acetaminophen (PERCOCET) 5-325 mg per tablet Take 1-2 Tabs by mouth every four (4) hours as needed for Pain.  60 Tab  0   ??? metFORMIN ER (GLUCOPHAGE XR) 500 mg tablet TAKE 5 TABLETS BY MOUTH DAILY  450 Tab  1   ??? multivitamin (ONE A DAY) tablet Take 1 Tab by mouth daily.       ??? cyanocobalamin (VITAMIN B-12) 1,000 mcg tablet Take 1,000 mcg by mouth daily.       ??? folic acid (FOLVITE) 1 mg tablet Take 1 mg by mouth daily.       ??? omeprazole (PRILOSEC) 40 mg capsule Take 40 mg by mouth nightly.       ??? ezetimibe (ZETIA) 10 mg tablet Take  by mouth nightly.       ??? gabapentin (NEURONTIN) 100 mg capsule Take  by mouth three (3) times daily.       ??? diphenhydrAMINE (BENADRYL) 25 mg capsule Take 25 mg by mouth every six (6) hours as needed.       ??? Ferrous Fumarate 325 mg (106 mg iron) Tab Take  by mouth daily.       ??? CYMBALTA 60 mg capsule TAKE ONE CAPSULE BY MOUTH EVERY DAY  90 Cap  2   ??? aspirin (ASPIRIN) 325 mg tablet Take 325 mg by mouth daily.       ??? insulin detemir (LEVEMIR) 100 unit/mL injection by SubCUTAneous route nightly. Inject 15 units at bedtime       ??? carvedilol (COREG) 6.25 mg tablet Take 6.25 mg by mouth daily.       ??? NITROSTAT 0.4 mg SL tablet TAKE 1 TABLET SL EVERY 5 MINUTES AS NEEDED FOR CHEST PAIN AS DIRECTED  50 Tab  1   ??? bumetanide (BUMEX) 2 mg tablet Take 2 mg by mouth daily.       ??? pravastatin (PRAVACHOL) 40 mg tablet Take 40 mg by mouth nightly.       ??? insulin lispro (HUMALOG) 100 unit/mL kwikpen Inject 2-10 units three times  daily  1 Package  11   ??? methocarbamol (ROBAXIN) 500 mg tablet TAKE 2 TABLETS 4 TIMES A DAY  100 Tab  0   ??? Cholecalciferol, Vitamin D3, (VITAMIN D3) 1,000 unit Cap Take 5,000 Units by mouth daily.       ??? omega-3 fatty acids-vitamin e (FISH OIL) 1,000 mg Cap Take 1,200 mg by mouth two (2) times a day.         Allergies   Allergen Reactions   ??? Morphine Anaphylaxis     Tolerates oxycodone without problem   ??? Avalide (Irbesartan-Hydrochlorothiazide) Myalgia   ??? Demerol (Meperidine) Other (comments)     Makes her feel crazy   ??? Pcn (Penicillins) Swelling   ??? Statins-Hmg-Coa Reductase Inhibitors Myalgia   ??? Sulfa (Sulfonamide Antibiotics) Unknown (comments)   ??? Tricor (Fenofibrate Micronized) Myalgia  Past Medical History   Diagnosis Date   ??? Hypertension    ??? CAD (coronary artery disease)    ??? Chronic pain    ??? Arthritis    ??? COPD    ??? Diabetes    ??? Goiter 04/20/2012   ??? GERD (gastroesophageal reflux disease)      Past Surgical History   Procedure Laterality Date   ??? Pr cardiac surg procedure unlist       PTCA   ??? 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 Hx knee arthroscopy Right      X2   ??? Hx other surgical  1970     GANGLION CYST   ??? Hx tonsillectomy  1963     Family History   Problem Relation Age of Onset   ??? Lung Disease Mother    ??? Stroke Father    ??? Lung Disease Brother      COPD   ??? Hypertension Brother    ??? Cancer Brother      PROSTATE   ??? Heart Disease Brother    ??? Diabetes Brother    ??? Anesth Problems Neg Hx      History   Substance Use Topics   ??? Smoking status: Current Every Day Smoker -- 0.30 packs/day for 45 years     Types: Cigarettes   ??? Smokeless tobacco: Never Used      Comment: passed ready   ??? Alcohol Use: Yes      Comment: may be 3 times a year if that          Objective:  BP 130/60   Ht 5' 5.5" (1.664 m)   Wt 212 lb (96.163 kg)   BMI 34.73 kg/m2  wdwn 62 yo wf  In NAD. A&O.  HEENT -- Pupils round.  O/P Clear.  Neck -- Supple. No JVD.  Heart -- RRR. 2/6 asm  Lungs -- Coarse breath sounds   Abdomen -- Soft. Non-tender. Non-distended. No masses. Bowel sounds present.  Extremities -- No edema.poor pulses      Results for orders placed in visit on 01/26/13   AMB POC URINALYSIS DIP STICK MANUAL W/O MICRO       Result Value Range    Color (UA POC) Yellow      Clarity (UA POC) Clear      Glucose (UA POC) Negative  Negative    Bilirubin (UA POC) Negative  Negative    Ketones (UA POC) Negative  Negative    Specific gravity (UA POC) 1.025  1.001 - 1.035    Blood (UA POC) Negative  Negative    pH (UA POC) 5.5  4.6 - 8.0    Protein (UA POC) Negative  Negative mg/dL    Urobilinogen (UA POC) 0.2 mg/dL  0.2 - 1    Nitrites (UA POC) Negative  Negative    Leukocyte esterase (UA POC) Negative  Negative   AMB POC HEMOGLOBIN A1C       Result Value Range    Hemoglobin A1c (POC) 6.5 (*) 4.8 - 5.6 %   AMB POC COMPLETE CBC,AUTOMATED ENTER       Result Value Range    WBC (POC) 7.9  4.5 - 10.5 K/uL    RBC (POC) 4.36  4.00 - 6.00 M/uL    HGB (POC) 13.3  12.0 - 16.0 g/dL    HCT (POC) 57.8  46.9 - 60.0 %    MCV (POC) 92.4  80.0 - 99.9 fL    MCH (POC) 30.6  27.0 - 31.0 pg    MCHC (POC) 33.1  33.0 - 37.0 g/dL    PLATELET (POC) 086  150 - 450 K/uL    LYMPHOCYTES (POC) 30.6  20.5 - 51.1 %    ABS. LYMPHS (POC) 2.4  1.2 - 3.4 K/uL   AMB POC LIPID PROFILE       Result Value Range    Cholesterol (POC) 139  100 - 199 mg/dL    Triglycerides (POC) 325 (*) 0 - 150 mg/dL    HDL Cholesterol (POC) 48  35 - 150 mg/dL    LDL Cholesterol (POC) 26  0 - 129 mg/dL    Non-HDL Goal (POC) 91      TChol/HDL Ratio (POC) 2.9  0.0 - 5.0 CALC         Assessment/Plan:  1. Coronary atherosclerosis of native coronary artery     2. Mixed hyperlipidemia  AMB POC COMPLETE CBC,AUTOMATED ENTER, AMB POC LIPID PROFILE, PR COLLECTION VENOUS BLOOD,VENIPUNCTURE, METABOLIC PANEL, COMPREHENSIVE   3. DM w/o complication type II  AMB POC URINALYSIS DIP STICK MANUAL W/O MICRO, AMB POC HEMOGLOBIN A1C   f/u 3 months        Author:  Lake Bells, MD 01/26/2013 9:57 AM

## 2013-01-27 LAB — METABOLIC PANEL, COMPREHENSIVE
A-G Ratio: 1.8 (ref 1.1–2.5)
ALT (SGPT): 20 IU/L (ref 0–32)
AST (SGOT): 21 IU/L (ref 0–40)
Albumin: 4.2 g/dL (ref 3.6–4.8)
Alk. phosphatase: 57 IU/L (ref 39–117)
BUN/Creatinine ratio: 18 (ref 11–26)
BUN: 14 mg/dL (ref 8–27)
Bilirubin, total: 0.2 mg/dL (ref 0.0–1.2)
CO2: 24 mmol/L (ref 19–28)
Calcium: 9.7 mg/dL (ref 8.6–10.2)
Chloride: 97 mmol/L (ref 97–108)
Creatinine: 0.77 mg/dL (ref 0.57–1.00)
GFR est AA: 96 mL/min/{1.73_m2} (ref >59)
GFR est non-AA: 83 mL/min/{1.73_m2} (ref >59)
GLOBULIN, TOTAL: 2.3 g/dL (ref 1.5–4.5)
Glucose: 182 mg/dL — ABNORMAL HIGH (ref 65–99)
Potassium: 4.9 mmol/L (ref 3.5–5.2)
Protein, total: 6.5 g/dL (ref 6.0–8.5)
Sodium: 140 mmol/L (ref 134–144)

## 2013-01-27 NOTE — Progress Notes (Signed)
Quick Note:    pls call- labs look good, bs was 182- watch the carbs on vacation  ______

## 2013-01-27 NOTE — Progress Notes (Signed)
Quick Note:    Called patient and relayed doctor's message  ______

## 2013-04-06 LAB — AMB POC URINALYSIS DIP STICK MANUAL W/O MICRO
Bilirubin (UA POC): NEGATIVE
Blood (UA POC): NEGATIVE
Glucose (UA POC): NEGATIVE
Ketones (UA POC): NEGATIVE
Leukocyte esterase (UA POC): NEGATIVE
Nitrites (UA POC): NEGATIVE
Protein (UA POC): NEGATIVE mg/dL
Specific gravity (UA POC): 1.015 (ref 1.001–1.035)
Urobilinogen (UA POC): 0.2 (ref 0.2–1)
pH (UA POC): 5 (ref 4.6–8.0)

## 2013-04-06 LAB — AMB POC HEMOGLOBIN A1C: Hemoglobin A1c (POC): 7.4 % — AB (ref 4.8–5.6)

## 2013-04-06 NOTE — Patient Instructions (Addendum)
Results for orders placed in visit on 04/06/13   AMB POC URINALYSIS DIP STICK MANUAL W/O MICRO       Result Value Range    Color (UA POC) Yellow      Clarity (UA POC) Clear      Glucose (UA POC) Negative  Negative    Bilirubin (UA POC) Negative  Negative    Ketones (UA POC) Negative  Negative    Specific gravity (UA POC) 1.015  1.001 - 1.035    Blood (UA POC) Negative  Negative    pH (UA POC) 5.0  4.6 - 8.0    Protein (UA POC) Negative  Negative mg/dL    Urobilinogen (UA POC) 0.2 mg/dL  0.2 - 1    Nitrites (UA POC) Negative  Negative    Leukocyte esterase (UA POC) Negative  Negative   AMB POC HEMOGLOBIN A1C       Result Value Range    Hemoglobin A1c (POC) 7.4 (*) 4.8 - 5.6 %     Decrease chantix to half, check blood sugars

## 2013-04-06 NOTE — Progress Notes (Signed)
Kelsey [name] is a 62 y.o. female and presents with   Chief Complaint   Patient presents with   ??? Foot Pain   .  Has been unable to sleep for the last week due to neuropathy. Self medicated with pain meds.  Feet do not bother her during the day. But as soon as she lies down they start to hurt.  They do not hurt  When she walks. Is not smoking- but still using chantix. Not checking blood sugars. Also mentions she has a mole on her right face.      Current Outpatient Prescriptions   Medication Sig Dispense Refill   ??? gabapentin (NEURONTIN) 300 mg capsule Take 1 Cap by mouth three (3) times daily.  30 Cap  1   ??? CHANTIX 1 mg tablet USE AS DIRECTED  56 Tab  2   ??? oxyCODONE-acetaminophen (PERCOCET) 5-325 mg per tablet Take 1-2 Tabs by mouth every four (4) hours as needed for Pain.  60 Tab  0   ??? metFORMIN ER (GLUCOPHAGE XR) 500 mg tablet TAKE 5 TABLETS BY MOUTH DAILY  450 Tab  1   ??? multivitamin (ONE A DAY) tablet Take 1 Tab by mouth daily.       ??? cyanocobalamin (VITAMIN B-12) 1,000 mcg tablet Take 1,000 mcg by mouth daily.       ??? folic acid (FOLVITE) 1 mg tablet Take 1 mg by mouth daily.       ??? omeprazole (PRILOSEC) 40 mg capsule Take 40 mg by mouth nightly.       ??? ezetimibe (ZETIA) 10 mg tablet Take  by mouth nightly.       ??? gabapentin (NEURONTIN) 100 mg capsule Take  by mouth three (3) times daily.       ??? diphenhydrAMINE (BENADRYL) 25 mg capsule Take 25 mg by mouth every six (6) hours as needed.       ??? Ferrous Fumarate 325 mg (106 mg iron) Tab Take  by mouth daily.       ??? CYMBALTA 60 mg capsule TAKE ONE CAPSULE BY MOUTH EVERY DAY  90 Cap  2   ??? aspirin (ASPIRIN) 325 mg tablet Take 325 mg by mouth daily.       ??? insulin detemir (LEVEMIR) 100 unit/mL injection by SubCUTAneous route nightly. Inject 15 units at bedtime       ??? carvedilol (COREG) 6.25 mg tablet Take 6.25 mg by mouth daily.       ??? NITROSTAT 0.4 mg SL tablet TAKE 1 TABLET SL EVERY 5 MINUTES AS NEEDED FOR CHEST PAIN AS DIRECTED  50 Tab  1   ???  bumetanide (BUMEX) 2 mg tablet Take 2 mg by mouth daily.       ??? pravastatin (PRAVACHOL) 40 mg tablet Take 40 mg by mouth nightly.       ??? insulin lispro (HUMALOG) 100 unit/mL kwikpen Inject 2-10 units three times daily  1 Package  11   ??? methocarbamol (ROBAXIN) 500 mg tablet TAKE 2 TABLETS 4 TIMES A DAY  100 Tab  0   ??? Cholecalciferol, Vitamin D3, (VITAMIN D3) 1,000 unit Cap Take 5,000 Units by mouth daily.       ??? omega-3 fatty acids-vitamin e (FISH OIL) 1,000 mg Cap Take 1,200 mg by mouth two (2) times a day.         Allergies   Allergen Reactions   ??? Morphine Anaphylaxis     Tolerates oxycodone without problem   ???  Avalide (Irbesartan-Hydrochlorothiazide) Myalgia   ??? Demerol (Meperidine) Other (comments)     Makes her feel crazy   ??? Pcn (Penicillins) Swelling   ??? Statins-Hmg-Coa Reductase Inhibitors Myalgia   ??? Sulfa (Sulfonamide Antibiotics) Unknown (comments)   ??? Tricor (Fenofibrate Micronized) Myalgia     Past Medical History   Diagnosis Date   ??? Hypertension    ??? CAD (coronary artery disease)    ??? Chronic pain    ??? Arthritis    ??? COPD    ??? Diabetes    ??? Goiter 04/20/2012   ??? GERD (gastroesophageal reflux disease)    ??? Neuropathy 04/06/2013     Past Surgical History   Procedure Laterality Date   ??? Pr cardiac surg procedure unlist       PTCA   ??? 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 Hx knee arthroscopy Right      X2   ??? Hx other surgical  1970     GANGLION CYST   ??? Hx tonsillectomy  1963     Family History   Problem Relation Age of Onset   ??? Lung Disease Mother    ??? Stroke Father    ??? Lung Disease Brother      COPD   ??? Hypertension Brother    ??? Cancer Brother      PROSTATE   ??? Heart Disease Brother    ??? Diabetes Brother    ??? Anesth Problems Neg Hx      History   Substance Use Topics   ??? Smoking status: Current Every Day Smoker -- 0.30 packs/day for 45 years     Types: Cigarettes   ??? Smokeless tobacco: Never Used      Comment: passed ready   ??? Alcohol Use: Yes      Comment: may be 3 times a year if that           Objective:  BP 140/80   Ht 5' 5.5" (1.664 m)   Wt 214 lb (97.07 kg)   BMI 35.06 kg/m2  wdwn 62 yo wf  In NAD. A&O.  HEENT -- Pupils round.  O/P Clear. Rt cheek- scabbing irreg area  Neck -- Supple. No JVD.  Heart -- RRR. No R/M/G.  Lungs -- CTA.  Abdomen -- Soft. Non-tender. Non-distended. No masses. Bowel sounds present.  Extremities -- No edema. +1 pulses      Results for orders placed in visit on 04/06/13   AMB POC URINALYSIS DIP STICK MANUAL W/O MICRO       Result Value Range    Color (UA POC) Yellow      Clarity (UA POC) Clear      Glucose (UA POC) Negative  Negative    Bilirubin (UA POC) Negative  Negative    Ketones (UA POC) Negative  Negative    Specific gravity (UA POC) 1.015  1.001 - 1.035    Blood (UA POC) Negative  Negative    pH (UA POC) 5.0  4.6 - 8.0    Protein (UA POC) Negative  Negative mg/dL    Urobilinogen (UA POC) 0.2 mg/dL  0.2 - 1    Nitrites (UA POC) Negative  Negative    Leukocyte esterase (UA POC) Negative  Negative   AMB POC HEMOGLOBIN A1C       Result Value Range    Hemoglobin A1c (POC) 7.4 (*) 4.8 - 5.6 %       Assessment/Plan:  ICD-9-CM    1. DM w/o complication type II 250.00 AMB POC URINALYSIS DIP STICK MANUAL W/O MICRO     AMB POC HEMOGLOBIN A1C   2. Neuropathy 355.9 VITAMIN D, 25 HYDROXY     PR COLLECTION VENOUS BLOOD,VENIPUNCTURE     TSH, 3RD GENERATION     VITAMIN B12     gabapentin (NEURONTIN) 300 mg capsule   3. Mole of skin 216.9 REFERRAL TO DERMATOLOGY     Decrease chantix to 1/2 daily, take 400 mg neurontin at night      Author:  Lake Bells, MD 04/06/2013 2:45 PM

## 2013-04-07 LAB — TSH 3RD GENERATION: TSH: 1.81 u[IU]/mL (ref 0.450–4.500)

## 2013-04-07 LAB — VITAMIN B12: Vitamin B12: 1055 pg/mL — ABNORMAL HIGH (ref 211–946)

## 2013-04-07 LAB — VITAMIN D, 25 HYDROXY: VITAMIN D, 25-HYDROXY: 23.9 ng/mL — ABNORMAL LOW (ref 30.0–100.0)

## 2013-04-14 NOTE — Progress Notes (Signed)
Quick Note:    pls call- vit b12 level fine, vit d level low. Take 2000 IU daily. Thyroid levels good. Blood sugars not as good as last time. If neurontin at 400 mg not helping, increase to 600 ,mg.  ______

## 2013-04-14 NOTE — Progress Notes (Signed)
Quick Note:    Spoke with patient and relayed Dr.s message  ______

## 2013-06-13 ENCOUNTER — Encounter

## 2013-07-04 LAB — AMB POC URINALYSIS DIP STICK MANUAL W/O MICRO
Bilirubin (UA POC): NEGATIVE
Blood (UA POC): NEGATIVE
Ketones (UA POC): NEGATIVE
Nitrites (UA POC): NEGATIVE
Protein (UA POC): NEGATIVE mg/dL
Specific gravity (UA POC): 1.02 (ref 1.001–1.035)
Urobilinogen (UA POC): 0.2 (ref 0.2–1)
pH (UA POC): 5.5 (ref 4.6–8.0)

## 2013-07-04 LAB — AMB POC COMPLETE CBC,AUTOMATED ENTER
ABS. GRANS (POC): 5.8 10*3/uL (ref 1.4–6.5)
ABS. LYMPHS (POC): 2.6 10*3/uL (ref 1.2–3.4)
ABS. MONOS (POC): 0.2 10*3/uL (ref 0.1–0.6)
GRANULOCYTES (POC): 67.2 % (ref 42.2–75.2)
HCT (POC): 34.7 % — AB (ref 35.0–60.0)
HGB (POC): 12.3 g/dL (ref 11–18)
LYMPHOCYTES (POC): 30.5 % (ref 20.5–51.1)
MCH (POC): 30.2 pg (ref 27.0–31.0)
MCHC (POC): 33.6 g/dL (ref 33.0–37.0)
MCV (POC): 89.9 fL (ref 80.0–99.9)
MONOCYTES (POC): 2.3 % (ref 1.7–9.3)
MPV (POC): 7.5 fL — AB (ref 7.8–11)
PLATELET (POC): 237 10*3/uL (ref 150–450)
RBC (POC): 4.08 M/uL (ref 4.00–6.00)
RDW (POC): 13.6 % (ref 11.6–13.7)
WBC (POC): 8.6 10*3/uL (ref 4.5–10.5)

## 2013-07-04 LAB — AMB POC HEMOGLOBIN A1C: Hemoglobin A1c (POC): 7.4 % — AB (ref 4.8–5.6)

## 2013-07-04 NOTE — Progress Notes (Signed)
Kelsey [name] is a 62 y.o. female and presents with   Chief Complaint   Patient presents with   ??? Dizziness   .  Started having dizzy spells last Friday. Did hit head 2 weeks prior- needing stitiches. Has had some blurry vision.  Blood sugars in am around 200. Has 3-4 spells daily, lasting up to 10 min.  ?nausea?vertigo?  Seems to be worse when she transitions from inside out or vice versa.. Denies chest pain or sob. No runny nose, sore throat or congestion.      Current Outpatient Prescriptions   Medication Sig Dispense Refill   ??? gabapentin (NEURONTIN) 300 mg capsule TAKE ONE CAPSULE BY MOUTH 3 TIMES A DAY  270 Cap  1   ??? CHANTIX 1 mg tablet USE AS DIRECTED  56 Tab  2   ??? gabapentin (NEURONTIN) 300 mg capsule TAKE ONE CAPSULE BY MOUTH 3 TIMES A DAY  30 Cap  1   ??? methocarbamol (ROBAXIN) 500 mg tablet TAKE 2 TABLETS 4 TIMES A DAY  100 Tab  0   ??? oxyCODONE-acetaminophen (PERCOCET) 5-325 mg per tablet Take 1-2 Tabs by mouth every four (4) hours as needed for Pain.  60 Tab  0   ??? metFORMIN ER (GLUCOPHAGE XR) 500 mg tablet TAKE 5 TABLETS BY MOUTH DAILY  450 Tab  1   ??? multivitamin (ONE A DAY) tablet Take 1 Tab by mouth daily.       ??? cyanocobalamin (VITAMIN B-12) 1,000 mcg tablet Take 1,000 mcg by mouth daily.       ??? folic acid (FOLVITE) 1 mg tablet Take 1 mg by mouth daily.       ??? omeprazole (PRILOSEC) 40 mg capsule Take 40 mg by mouth nightly.       ??? ezetimibe (ZETIA) 10 mg tablet Take  by mouth nightly.       ??? gabapentin (NEURONTIN) 100 mg capsule Take  by mouth three (3) times daily.       ??? diphenhydrAMINE (BENADRYL) 25 mg capsule Take 25 mg by mouth every six (6) hours as needed.       ??? Ferrous Fumarate 325 mg (106 mg iron) Tab Take  by mouth daily.       ??? CYMBALTA 60 mg capsule TAKE ONE CAPSULE BY MOUTH EVERY DAY  90 Cap  2   ??? aspirin (ASPIRIN) 325 mg tablet Take 325 mg by mouth daily.       ??? insulin detemir (LEVEMIR) 100 unit/mL injection by SubCUTAneous route nightly. Inject 15 units at bedtime        ??? carvedilol (COREG) 6.25 mg tablet Take 6.25 mg by mouth daily.       ??? NITROSTAT 0.4 mg SL tablet TAKE 1 TABLET SL EVERY 5 MINUTES AS NEEDED FOR CHEST PAIN AS DIRECTED  50 Tab  1   ??? bumetanide (BUMEX) 2 mg tablet Take 2 mg by mouth daily.       ??? pravastatin (PRAVACHOL) 40 mg tablet Take 40 mg by mouth nightly.       ??? insulin lispro (HUMALOG) 100 unit/mL kwikpen Inject 2-10 units three times daily  1 Package  11   ??? methocarbamol (ROBAXIN) 500 mg tablet TAKE 2 TABLETS 4 TIMES A DAY  100 Tab  0   ??? Cholecalciferol, Vitamin D3, (VITAMIN D3) 1,000 unit Cap Take 5,000 Units by mouth daily.       ??? omega-3 fatty acids-vitamin e (FISH OIL) 1,000 mg Cap  Take 1,200 mg by mouth two (2) times a day.         Allergies   Allergen Reactions   ??? Morphine Anaphylaxis     Tolerates oxycodone without problem   ??? Avalide (Irbesartan-Hydrochlorothiazide) Myalgia   ??? Demerol (Meperidine) Other (comments)     Makes her feel crazy   ??? Pcn (Penicillins) Swelling   ??? Statins-Hmg-Coa Reductase Inhibitors Myalgia   ??? Sulfa (Sulfonamide Antibiotics) Unknown (comments)   ??? Tricor (Fenofibrate Micronized) Myalgia     Past Medical History   Diagnosis Date   ??? Hypertension    ??? CAD (coronary artery disease)    ??? Chronic pain    ??? Arthritis    ??? COPD    ??? Diabetes    ??? Goiter 04/20/2012   ??? GERD (gastroesophageal reflux disease)    ??? Neuropathy 04/06/2013     Past Surgical History   Procedure Laterality Date   ??? Pr cardiac surg procedure unlist       PTCA   ??? 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 Hx knee arthroscopy Right      X2   ??? Hx other surgical  1970     GANGLION CYST   ??? Hx tonsillectomy  1963     Family History   Problem Relation Age of Onset   ??? Lung Disease Mother    ??? Stroke Father    ??? Lung Disease Brother      COPD   ??? Hypertension Brother    ??? Cancer Brother      PROSTATE   ??? Heart Disease Brother    ??? Diabetes Brother    ??? Anesth Problems Neg Hx      History   Substance Use Topics   ??? Smoking status: Current Every Day  Smoker -- 0.30 packs/day for 45 years     Types: Cigarettes   ??? Smokeless tobacco: Never Used      Comment: passed ready   ??? Alcohol Use: Yes      Comment: may be 3 times a year if that          Objective:  BP 140/70  wdwn 62 yo wf-   In NAD. A&O.  HEENT -- Pupils round.  O/P Clear.  Neck -- Supple. No JVD.  Heart -- RRR. 2/6 asm  Lungs -- CTA.  Abdomen -- Soft. Non-tender. Non-distended. No masses. Bowel sounds present.  Extremities -- No edema.poor pulses   Neuro- no lateralizing weakness, CN 2-12 grossly intact        Results for orders placed in visit on 07/04/13   AMB POC URINALYSIS DIP STICK MANUAL W/O MICRO       Result Value Range    Color (UA POC) Yellow      Clarity (UA POC) Clear      Glucose (UA POC) 1+  Negative    Bilirubin (UA POC) Negative  Negative    Ketones (UA POC) Negative  Negative    Specific gravity (UA POC) 1.020  1.001 - 1.035    Blood (UA POC) Negative  Negative    pH (UA POC) 5.5  4.6 - 8.0    Protein (UA POC) Negative  Negative mg/dL    Urobilinogen (UA POC) 0.2 mg/dL  0.2 - 1    Nitrites (UA POC) Negative  Negative    Leukocyte esterase (UA POC) Trace  Negative   AMB POC HEMOGLOBIN A1C  Result Value Range    Hemoglobin A1c (POC) 7.4 (*) 4.8 - 5.6 %   AMB POC COMPLETE CBC,AUTOMATED ENTER       Result Value Range    WBC (POC) 8.6  4.5 - 10.5 K/uL    LYMPHOCYTES (POC) 30.5  20.5 - 51.1 %    MONOCYTES (POC) 2.3  1.7 - 9.3 %    GRANULOCYTES (POC) 67.2  42.2 - 75.2 %    ABS. LYMPHS (POC) 2.6  1.2 - 3.4 K/uL    ABS. MONOS (POC) 0.2  0.1 - 0.6 10^3/ul    ABS. GRANS (POC) 5.8  1.4 - 6.5 10^3/ul    RBC (POC) 4.08  4.00 - 6.00 M/uL    HGB (POC) 12.3  11 - 18 g/dL    HCT (POC) 16.1 (*) 09.6 - 60.0 %    MCV (POC) 89.9  80.0 - 99.9 fL    MCH (POC) 30.2  27.0 - 31.0 pg    MCHC (POC) 33.6  33.0 - 37.0 g/dL    RDW (POC) 04.5  40.9 - 13.7 %    PLATELET (POC) 237  150 - 450 K/uL    MPV (POC) 7.5 (*) 7.8 - 11 fL       Assessment/Plan:    ICD-9-CM    1. Dizziness 780.4 CT HEAD WO CONT     AMB POC  URINALYSIS DIP STICK MANUAL W/O MICRO     AMB POC HEMOGLOBIN A1C     PR COLLECTION VENOUS BLOOD,VENIPUNCTURE     METABOLIC PANEL, COMPREHENSIVE     AMB POC COMPLETE CBC,AUTOMATED ENTER     TSH, 3RD GENERATION     Set up head ct,       Author:  Lake Bells, MD 07/04/2013 11:25 AM

## 2013-07-04 NOTE — Patient Instructions (Signed)
Monitor bp and blood sugars

## 2013-07-05 LAB — METABOLIC PANEL, COMPREHENSIVE
A-G Ratio: 2.4 (ref 1.1–2.5)
ALT (SGPT): 37 IU/L — ABNORMAL HIGH (ref 0–32)
AST (SGOT): 35 IU/L (ref 0–40)
Albumin: 4.4 g/dL (ref 3.6–4.8)
Alk. phosphatase: 54 IU/L (ref 39–117)
BUN/Creatinine ratio: 25 (ref 11–26)
BUN: 20 mg/dL (ref 8–27)
Bilirubin, total: 0.2 mg/dL (ref 0.0–1.2)
CO2: 24 mmol/L (ref 18–29)
Calcium: 9.9 mg/dL (ref 8.6–10.2)
Chloride: 97 mmol/L (ref 97–108)
Creatinine: 0.79 mg/dL (ref 0.57–1.00)
GFR est AA: 93 mL/min/{1.73_m2} (ref >59)
GFR est non-AA: 80 mL/min/{1.73_m2} (ref >59)
GLOBULIN, TOTAL: 1.8 g/dL (ref 1.5–4.5)
Glucose: 333 mg/dL — ABNORMAL HIGH (ref 65–99)
Potassium: 4.5 mmol/L (ref 3.5–5.2)
Protein, total: 6.2 g/dL (ref 6.0–8.5)
Sodium: 137 mmol/L (ref 134–144)

## 2013-07-05 LAB — TSH 3RD GENERATION: TSH: 2.28 u[IU]/mL (ref 0.450–4.500)

## 2013-07-05 NOTE — Progress Notes (Signed)
Quick Note:    Called patient and relayed doctor's message  ______

## 2013-07-05 NOTE — Progress Notes (Signed)
Quick Note:    pls call- labs ok except for blood sugar over 300. Thyroid normal. How are you feeling. I am awaiting ct results  ______

## 2013-07-14 LAB — AMB POC URINALYSIS DIP STICK AUTO W/ MICRO
Bilirubin (UA POC): NEGATIVE
Glucose (UA POC): NEGATIVE
Ketones (UA POC): NEGATIVE
Nitrites (UA POC): NEGATIVE
Specific gravity (UA POC): 1.015 (ref 1.001–1.035)
Urobilinogen (UA POC): 0.2 (ref 0.2–1)
pH (UA POC): 5.5 (ref 4.6–8.0)

## 2013-07-14 NOTE — Progress Notes (Signed)
Kelsey [name] is a 62 y.o. female and presents with   Chief Complaint   Patient presents with   ??? Blood in Urine   .  Felt fine yesterday.  Had lower abd pain and difficulty urinating today- noticed blood when she wiped and in the commode.  No unusual back pain , no fever, nausea or vomiting.  Appetite ok.  Blood sugars 180 this am and 160 at lunch      Current Outpatient Prescriptions   Medication Sig Dispense Refill   ??? ciprofloxacin (CIPRO) 500 mg tablet Take 1 Tab by mouth two (2) times a day for 10 days.  20 Tab  0   ??? gabapentin (NEURONTIN) 300 mg capsule TAKE ONE CAPSULE BY MOUTH 3 TIMES A DAY  270 Cap  1   ??? CHANTIX 1 mg tablet USE AS DIRECTED  56 Tab  2   ??? gabapentin (NEURONTIN) 300 mg capsule TAKE ONE CAPSULE BY MOUTH 3 TIMES A DAY  30 Cap  1   ??? methocarbamol (ROBAXIN) 500 mg tablet TAKE 2 TABLETS 4 TIMES A DAY  100 Tab  0   ??? oxyCODONE-acetaminophen (PERCOCET) 5-325 mg per tablet Take 1-2 Tabs by mouth every four (4) hours as needed for Pain.  60 Tab  0   ??? metFORMIN ER (GLUCOPHAGE XR) 500 mg tablet TAKE 5 TABLETS BY MOUTH DAILY  450 Tab  1   ??? multivitamin (ONE A DAY) tablet Take 1 Tab by mouth daily.       ??? cyanocobalamin (VITAMIN B-12) 1,000 mcg tablet Take 1,000 mcg by mouth daily.       ??? folic acid (FOLVITE) 1 mg tablet Take 1 mg by mouth daily.       ??? omeprazole (PRILOSEC) 40 mg capsule Take 40 mg by mouth nightly.       ??? ezetimibe (ZETIA) 10 mg tablet Take  by mouth nightly.       ??? gabapentin (NEURONTIN) 100 mg capsule Take  by mouth three (3) times daily.       ??? diphenhydrAMINE (BENADRYL) 25 mg capsule Take 25 mg by mouth every six (6) hours as needed.       ??? Ferrous Fumarate 325 mg (106 mg iron) Tab Take  by mouth daily.       ??? CYMBALTA 60 mg capsule TAKE ONE CAPSULE BY MOUTH EVERY DAY  90 Cap  2   ??? aspirin (ASPIRIN) 325 mg tablet Take 325 mg by mouth daily.       ??? insulin detemir (LEVEMIR) 100 unit/mL injection by SubCUTAneous route nightly. Inject 15 units at bedtime       ???  carvedilol (COREG) 6.25 mg tablet Take 6.25 mg by mouth daily.       ??? NITROSTAT 0.4 mg SL tablet TAKE 1 TABLET SL EVERY 5 MINUTES AS NEEDED FOR CHEST PAIN AS DIRECTED  50 Tab  1   ??? bumetanide (BUMEX) 2 mg tablet Take 2 mg by mouth daily.       ??? pravastatin (PRAVACHOL) 40 mg tablet Take 40 mg by mouth nightly.       ??? insulin lispro (HUMALOG) 100 unit/mL kwikpen Inject 2-10 units three times daily  1 Package  11   ??? methocarbamol (ROBAXIN) 500 mg tablet TAKE 2 TABLETS 4 TIMES A DAY  100 Tab  0   ??? Cholecalciferol, Vitamin D3, (VITAMIN D3) 1,000 unit Cap Take 5,000 Units by mouth daily.       ???  omega-3 fatty acids-vitamin e (FISH OIL) 1,000 mg Cap Take 1,200 mg by mouth two (2) times a day.         Allergies   Allergen Reactions   ??? Morphine Anaphylaxis     Tolerates oxycodone without problem   ??? Avalide (Irbesartan-Hydrochlorothiazide) Myalgia   ??? Demerol (Meperidine) Other (comments)     Makes her feel crazy   ??? Pcn (Penicillins) Swelling   ??? Statins-Hmg-Coa Reductase Inhibitors Myalgia   ??? Sulfa (Sulfonamide Antibiotics) Unknown (comments)   ??? Tricor (Fenofibrate Micronized) Myalgia     Past Medical History   Diagnosis Date   ??? Hypertension    ??? CAD (coronary artery disease)    ??? Chronic pain    ??? Arthritis    ??? COPD    ??? Diabetes    ??? Goiter 04/20/2012   ??? GERD (gastroesophageal reflux disease)    ??? Neuropathy 04/06/2013     Past Surgical History   Procedure Laterality Date   ??? Pr cardiac surg procedure unlist       PTCA   ??? 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 Hx knee arthroscopy Right      X2   ??? Hx other surgical  1970     GANGLION CYST   ??? Hx tonsillectomy  1963     Family History   Problem Relation Age of Onset   ??? Lung Disease Mother    ??? Stroke Father    ??? Lung Disease Brother      COPD   ??? Hypertension Brother    ??? Cancer Brother      PROSTATE   ??? Heart Disease Brother    ??? Diabetes Brother    ??? Anesth Problems Neg Hx      History   Substance Use Topics   ??? Smoking status: Current Every Day  Smoker -- 0.30 packs/day for 45 years     Types: Cigarettes   ??? Smokeless tobacco: Never Used      Comment: passed ready   ??? Alcohol Use: Yes      Comment: may be 3 times a year if that          Objective:  BP 120/60   Ht 5' 5.5" (1.664 m)  wdwn 62 yo wf  In NAD. A&O.  HEENT -- Pupils round.  O/P Clear.  Neck -- Supple. No JVD.  Heart -- RRR. No R/M/G.  Lungs -- CTA.  Abdomen -- Soft. Non-tender. Non-distended. No masses. Bowel sounds present., no cva tenderness  Extremities -- No edema.+ 1 pulses      Results for orders placed in visit on 07/14/13   AMB POC URINALYSIS DIP STICK AUTO W/ MICRO       Result Value Range    Color (UA POC) Brown      Clarity (UA POC) Cloudy      Glucose (UA POC) Negative  Negative    Bilirubin (UA POC) Negative  Negative    Ketones (UA POC) Negative  Negative    Specific gravity (UA POC) 1.015  1.001 - 1.035    Blood (UA POC) 4+  Negative    pH (UA POC) 5.5  4.6 - 8.0    Protein (UA POC) 1+  Negative mg/dL    Urobilinogen (UA POC) 0.2 mg/dL  0.2 - 1    Nitrites (UA POC) Negative  Negative    Leukocyte esterase (UA POC) 4+  Negative  Assessment/Plan:    ICD-9-CM    1. Hematuria 599.70 AMB POC URINALYSIS DIP STICK AUTO W/ MICRO     ciprofloxacin (CIPRO) 500 mg tablet     CULTURE, URINE     CANCELED: AMB POC URINALYSIS DIP STICK MANUAL W/O MICRO           Author:  Lake Bells, MD 07/16/2013 3:51 PM

## 2013-07-16 NOTE — Patient Instructions (Signed)
Call for culture results MOnday

## 2013-07-17 LAB — CULTURE, URINE

## 2013-07-17 NOTE — Progress Notes (Signed)
Quick Note:    pls call- urine culture grew e. Col sensitive to cipro- so just finish the course.  ______

## 2013-07-18 NOTE — Progress Notes (Signed)
Quick Note:    Called patient and relayed doctor's message  ______

## 2013-09-28 NOTE — Telephone Encounter (Signed)
Please advise [phone]

## 2013-09-28 NOTE — Telephone Encounter (Signed)
Kelsey [name] is in [place], has neuropothy in both feet, today the pain is very painful, meds not helping, please call.  Will obtain pharmacy info for when you call bk.  [phone]

## 2013-09-28 NOTE — Telephone Encounter (Signed)
Dr. Cherly Hensen advised she could take the tramadol 50 mg every 6 hrs prn #40 to  [facility] [phone]

## 2013-10-07 LAB — AMB POC COMPLETE CBC,AUTOMATED ENTER
ABS. GRANS (POC): 5.3 10*3/uL (ref 1.4–6.5)
ABS. LYMPHS (POC): 2.5 10*3/uL (ref 1.2–3.4)
ABS. MONOS (POC): 0.5 10*3/uL (ref 0.1–0.6)
GRANULOCYTES (POC): 63.5 % (ref 42.2–75.2)
HCT (POC): 38.5 % (ref 35.0–60.0)
HGB (POC): 12.4 g/dL (ref 11–18)
LYMPHOCYTES (POC): 30.6 % (ref 20.5–51.1)
MCH (POC): 29.7 pg (ref 27.0–31.0)
MCHC (POC): 32.3 g/dL — AB (ref 33.0–37.0)
MCV (POC): 92 fL (ref 80.0–99.9)
MONOCYTES (POC): 5.9 % (ref 1.7–9.3)
MPV (POC): 7.4 fL — AB (ref 7.8–11)
PLATELET (POC): 244 10*3/uL (ref 150–450)
RBC (POC): 4.18 M/uL (ref 4.00–6.00)
RDW (POC): 14.4 % — AB (ref 11.6–13.7)
WBC (POC): 8.3 10*3/uL (ref 4.5–10.5)

## 2013-10-07 LAB — AMB POC LIPID PROFILE
Cholesterol (POC): 158 mg/dL (ref 100–199)
HDL Cholesterol (POC): 57 mg/dL (ref 35–150)
LDL Cholesterol (POC): 52 mg/dL (ref 0–129)
Non-HDL Goal (POC): 101
TChol/HDL Ratio (POC): 2.8 CALC (ref 0.0–5.0)
Triglycerides (POC): 241 mg/dL — AB (ref 0–150)

## 2013-10-07 LAB — AMB POC HEMOGLOBIN A1C: Hemoglobin A1c (POC): 7.3 % — AB (ref 4.8–5.6)

## 2013-10-07 NOTE — Progress Notes (Signed)
Kelsey [name] is a 62 y.o. female and presents with   Chief Complaint   Patient presents with   ??? Peripheral Neuropathy   .  Pain in feet- prevent her from sleeping.  Pain is tolerable during the day.  Takes 2 neurontin at 8 pm and 2 at 10 pm and another 2 during the night.  Has taken tylenol pm. artiritis strength bc, aspirin, and nothing helps. Has been in [place]- was stressed due to taking care of elderly friend.  Did not check bs while there. Since home has been on the high side. Feet swelled on the plane- pain is so much worse then. Tramadol may help some.      Current Outpatient Prescriptions   Medication Sig Dispense Refill   ??? pregabalin (LYRICA) 50 mg capsule Take 1 capsule by mouth two (2) times a day.  30 capsule  1   ??? alprazolam (XANAX) 1 mg tablet Take 1 tablet by mouth nightly as needed for Anxiety.  10 tablet  0   ??? duloxetine (CYMBALTA) 60 mg capsule TAKE ONE CAPSULE BY MOUTH EVERY DAY  90 capsule  1   ??? tramadol (ULTRAM) 50 mg tablet Take 1 tablet by mouth every six (6) hours as needed for Pain.  40 tablet  0   ??? CHANTIX 1 mg tablet USE AS DIRECTED  56 tablet  2   ??? metFORMIN ER (GLUCOPHAGE XR) 500 mg tablet TAKE 5 TABLETS BY MOUTH DAILY  450 tablet  1   ??? gabapentin (NEURONTIN) 300 mg capsule TAKE ONE CAPSULE BY MOUTH 3 TIMES A DAY  270 Cap  1   ??? gabapentin (NEURONTIN) 300 mg capsule TAKE ONE CAPSULE BY MOUTH 3 TIMES A DAY  30 Cap  1   ??? methocarbamol (ROBAXIN) 500 mg tablet TAKE 2 TABLETS 4 TIMES A DAY  100 Tab  0   ??? oxyCODONE-acetaminophen (PERCOCET) 5-325 mg per tablet Take 1-2 Tabs by mouth every four (4) hours as needed for Pain.  60 Tab  0   ??? multivitamin (ONE A DAY) tablet Take 1 Tab by mouth daily.       ??? cyanocobalamin (VITAMIN B-12) 1,000 mcg tablet Take 1,000 mcg by mouth daily.       ??? folic acid (FOLVITE) 1 mg tablet Take 1 mg by mouth daily.       ??? omeprazole (PRILOSEC) 40 mg capsule Take 40 mg by mouth nightly.       ??? ezetimibe (ZETIA) 10 mg tablet Take  by mouth nightly.        ??? gabapentin (NEURONTIN) 100 mg capsule Take  by mouth three (3) times daily.       ??? diphenhydrAMINE (BENADRYL) 25 mg capsule Take 25 mg by mouth every six (6) hours as needed.       ??? Ferrous Fumarate 325 mg (106 mg iron) Tab Take  by mouth daily.       ??? aspirin (ASPIRIN) 325 mg tablet Take 325 mg by mouth daily.       ??? insulin detemir (LEVEMIR) 100 unit/mL injection by SubCUTAneous route nightly. Inject 15 units at bedtime       ??? carvedilol (COREG) 6.25 mg tablet Take 6.25 mg by mouth daily.       ??? NITROSTAT 0.4 mg SL tablet TAKE 1 TABLET SL EVERY 5 MINUTES AS NEEDED FOR CHEST PAIN AS DIRECTED  50 Tab  1   ??? bumetanide (BUMEX) 2 mg tablet Take 2 mg  by mouth daily.       ??? pravastatin (PRAVACHOL) 40 mg tablet Take 40 mg by mouth nightly.       ??? insulin lispro (HUMALOG) 100 unit/mL kwikpen Inject 2-10 units three times daily  1 Package  11   ??? methocarbamol (ROBAXIN) 500 mg tablet TAKE 2 TABLETS 4 TIMES A DAY  100 Tab  0   ??? Cholecalciferol, Vitamin D3, (VITAMIN D3) 1,000 unit Cap Take 5,000 Units by mouth daily.       ??? omega-3 fatty acids-vitamin e (FISH OIL) 1,000 mg Cap Take 1,200 mg by mouth two (2) times a day.         Allergies   Allergen Reactions   ??? Morphine Anaphylaxis     Tolerates oxycodone without problem   ??? Avalide [Irbesartan-Hydrochlorothiazide] Myalgia   ??? Demerol [Meperidine] Other (comments)     Makes her feel crazy   ??? Pcn [Penicillins] Swelling   ??? Statins-Hmg-Coa Reductase Inhibitors Myalgia   ??? Sulfa (Sulfonamide Antibiotics) Unknown (comments)   ??? Tricor [Fenofibrate Micronized] Myalgia     Past Medical History   Diagnosis Date   ??? Hypertension    ??? CAD (coronary artery disease)    ??? Chronic pain    ??? Arthritis    ??? COPD    ??? Diabetes    ??? Goiter 04/20/2012   ??? GERD (gastroesophageal reflux disease)    ??? Neuropathy 04/06/2013     Past Surgical History   Procedure Laterality Date   ??? Pr cardiac surg procedure unlist       PTCA   ??? 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 Hx  knee arthroscopy Right      X2   ??? Hx other surgical  1970     GANGLION CYST   ??? Hx tonsillectomy  1963     Family History   Problem Relation Age of Onset   ??? Lung Disease Mother    ??? Stroke Father    ??? Lung Disease Brother      COPD   ??? Hypertension Brother    ??? Cancer Brother      PROSTATE   ??? Heart Disease Brother    ??? Diabetes Brother    ??? Anesth Problems Neg Hx      History   Substance Use Topics   ??? Smoking status: Current Every Day Smoker -- 0.30 packs/day for 45 years     Types: Cigarettes   ??? Smokeless tobacco: Never Used      Comment: passed ready   ??? Alcohol Use: Yes      Comment: may be 3 times a year if that          Objective:  BP 130/70   Ht 5' 5.5" (1.664 m)  62 yo wf  In NAD. A&O.  HEENT -- Pupils round.  O/P Clear.  Neck -- Supple. No JVD.  Heart -- RRR. No R/M/G.  Lungs -- CTA.  Abdomen -- Soft. Non-tender. Non-distended. No masses. Bowel sounds present.  Extremities -- No edema.+1 pulses, no lesions     Results for orders placed in visit on 10/07/13   AMB POC HEMOGLOBIN A1C       Result Value Range    Hemoglobin A1c (POC) 7.3 (*) 4.8 - 5.6 %   AMB POC LIPID PROFILE       Result Value Range    Cholesterol (POC) 158  100 - 199 mg/dL  Triglycerides (POC) 241 (*) 0 - 150 mg/dL    HDL Cholesterol (POC) 57  35 - 150 mg/dL    LDL Cholesterol (POC) 52  0 - 129 mg/dL    Non-HDL Goal (POC) 161      TChol/HDL Ratio (POC) 2.8  0.0 - 5.0 CALC   AMB POC COMPLETE CBC,AUTOMATED ENTER       Result Value Range    WBC (POC) 8.3  4.5 - 10.5 K/uL    LYMPHOCYTES (POC) 30.6  20.5 - 51.1 %    MONOCYTES (POC) 5.9  1.7 - 9.3 %    GRANULOCYTES (POC) 63.5  42.2 - 75.2 %    ABS. LYMPHS (POC) 2.5  1.2 - 3.4 K/uL    ABS. MONOS (POC) 0.5  0.1 - 0.6 10^3/ul    ABS. GRANS (POC) 5.3  1.4 - 6.5 10^3/ul    RBC (POC) 4.18  4.00 - 6.00 M/uL    HGB (POC) 12.4  11 - 18 g/dL    HCT (POC) 09.6  04.5 - 60.0 %    MCV (POC) 92.0  80.0 - 99.9 fL    MCH (POC) 29.7  27.0 - 31.0 pg    MCHC (POC) 32.3 (*) 33.0 - 37.0 g/dL    RDW (POC) 40.9 (*)  11.6 - 13.7 %    PLATELET (POC) 244  150 - 450 K/uL    MPV (POC) 7.4 (*) 7.8 - 11 fL          Assessment/Plan:    ICD-9-CM    1. Neuropathy 355.9 PR COLLECTION VENOUS BLOOD,VENIPUNCTURE     METABOLIC PANEL, COMPREHENSIVE     AMB POC COMPLETE CBC,AUTOMATED ENTER     pregabalin (LYRICA) 50 mg capsule     alprazolam (XANAX) 1 mg tablet   2. Mixed hyperlipidemia 272.2 AMB POC LIPID PROFILE   3. DM w/o complication type II 250.00 AMB POC HEMOGLOBIN A1C     Continue cymbalta, try lyrica, add evening primrose oil      Author:  Lake Bells, MD 10/07/2013 4:08 PM

## 2013-10-08 LAB — METABOLIC PANEL, COMPREHENSIVE
A-G Ratio: 2.6 — ABNORMAL HIGH (ref 1.1–2.5)
ALT (SGPT): 29 IU/L (ref 0–32)
AST (SGOT): 30 IU/L (ref 0–40)
Albumin: 4.5 g/dL (ref 3.6–4.8)
Alk. phosphatase: 63 IU/L (ref 39–117)
BUN/Creatinine ratio: 17 (ref 11–26)
BUN: 15 mg/dL (ref 8–27)
Bilirubin, total: <0.2 mg/dL (ref 0.0–1.2)
CO2: 25 mmol/L (ref 18–29)
Calcium: 9.7 mg/dL (ref 8.6–10.2)
Chloride: 99 mmol/L (ref 97–108)
Creatinine: 0.86 mg/dL (ref 0.57–1.00)
GFR est AA: 84 mL/min/{1.73_m2} (ref >59)
GFR est non-AA: 73 mL/min/{1.73_m2} (ref >59)
GLOBULIN, TOTAL: 1.7 g/dL (ref 1.5–4.5)
Glucose: 124 mg/dL — ABNORMAL HIGH (ref 65–99)
Potassium: 4.7 mmol/L (ref 3.5–5.2)
Protein, total: 6.2 g/dL (ref 6.0–8.5)
Sodium: 142 mmol/L (ref 134–144)

## 2013-10-08 NOTE — Progress Notes (Signed)
Quick Note:    pls call- labs look fine, hope you are better  ______

## 2013-10-10 NOTE — Progress Notes (Signed)
Quick Note:    Called patient and relayed doctor's message  ______

## 2013-10-10 NOTE — Telephone Encounter (Signed)
Lidoderm patch works for sleep

## 2013-10-31 LAB — AMB POC URINALYSIS DIP STICK MANUAL W/O MICRO
Bilirubin (UA POC): NEGATIVE
Blood (UA POC): NEGATIVE
Glucose (UA POC): NEGATIVE
Ketones (UA POC): NEGATIVE
Nitrites (UA POC): NEGATIVE
Protein (UA POC): NEGATIVE mg/dL
Specific gravity (UA POC): 1.015 (ref 1.001–1.035)
Urobilinogen (UA POC): 0.2 (ref 0.2–1)
pH (UA POC): 5.5 (ref 4.6–8.0)

## 2013-10-31 LAB — AMB POC HEMOGLOBIN A1C: Hemoglobin A1c (POC): 7.5 % — AB (ref 4.8–5.6)

## 2013-10-31 NOTE — Progress Notes (Signed)
Kelsey [name] is a 62 y.o. female and presents with   Chief Complaint   Patient presents with   ??? Neck Pain   .  Has been having severe neck pain- since Sat.  neck popped and felt somewhat better- but she has not slept well.  Drove to [facility].  No neck injury that she remembers.  . Pain radiates down rt arm.  Has been using gabapentin and tramadol  2 every 4 hours for pain.  Feet much better.     She has spells of "freezing" from the inside  Occasionally. Then breaks out in sweats a few hours later.   Also would like to discuss general pain management.  States I gave her a "measly 40" tabs of tramadol last time. Would like a full months prescription      Current Outpatient Prescriptions   Medication Sig Dispense Refill   ??? traMADol (ULTRAM) 50 mg tablet Take 2 tablets by mouth every six (6) hours as needed for Pain.  200 tablet  1   ??? diazepam (VALIUM) 10 mg tablet Take 1 tablet by mouth every twelve (12) hours as needed for Anxiety (spasm).  10 tablet  0   ??? lidocaine, PF, (XYLOCAINE) 10 mg/mL (1 %) injection 0.5 mL by Intra artICUlar route once for 1 dose.  0.5 mL  0   ??? lidocaine (LIDODERM) 5 %(700 mg/patch) 1 patch by TransDERmal route every twelve (12) hours. Apply patch to the affected area for 12 hours a day and remove for 12 hours a day.  30 Package  6   ??? pregabalin (LYRICA) 50 mg capsule Take 1 capsule by mouth two (2) times a day.  30 capsule  1   ??? alprazolam (XANAX) 1 mg tablet Take 1 tablet by mouth nightly as needed for Anxiety.  10 tablet  0   ??? duloxetine (CYMBALTA) 60 mg capsule TAKE ONE CAPSULE BY MOUTH EVERY DAY  90 capsule  1   ??? CHANTIX 1 mg tablet USE AS DIRECTED  56 tablet  2   ??? metFORMIN ER (GLUCOPHAGE XR) 500 mg tablet TAKE 5 TABLETS BY MOUTH DAILY  450 tablet  1   ??? gabapentin (NEURONTIN) 300 mg capsule TAKE ONE CAPSULE BY MOUTH 3 TIMES A DAY  270 Cap  1   ??? gabapentin (NEURONTIN) 300 mg capsule TAKE ONE CAPSULE BY MOUTH 3 TIMES A DAY  30 Cap  1   ??? methocarbamol (ROBAXIN) 500 mg tablet  TAKE 2 TABLETS 4 TIMES A DAY  100 Tab  0   ??? oxyCODONE-acetaminophen (PERCOCET) 5-325 mg per tablet Take 1-2 Tabs by mouth every four (4) hours as needed for Pain.  60 Tab  0   ??? multivitamin (ONE A DAY) tablet Take 1 Tab by mouth daily.       ??? cyanocobalamin (VITAMIN B-12) 1,000 mcg tablet Take 1,000 mcg by mouth daily.       ??? folic acid (FOLVITE) 1 mg tablet Take 1 mg by mouth daily.       ??? omeprazole (PRILOSEC) 40 mg capsule Take 40 mg by mouth nightly.       ??? ezetimibe (ZETIA) 10 mg tablet Take  by mouth nightly.       ??? gabapentin (NEURONTIN) 100 mg capsule Take  by mouth three (3) times daily.       ??? diphenhydrAMINE (BENADRYL) 25 mg capsule Take 25 mg by mouth every six (6) hours as needed.       ???  Ferrous Fumarate 325 mg (106 mg iron) Tab Take  by mouth daily.       ??? aspirin (ASPIRIN) 325 mg tablet Take 325 mg by mouth daily.       ??? insulin detemir (LEVEMIR) 100 unit/mL injection by SubCUTAneous route nightly. Inject 15 units at bedtime       ??? carvedilol (COREG) 6.25 mg tablet Take 6.25 mg by mouth daily.       ??? NITROSTAT 0.4 mg SL tablet TAKE 1 TABLET SL EVERY 5 MINUTES AS NEEDED FOR CHEST PAIN AS DIRECTED  50 Tab  1   ??? bumetanide (BUMEX) 2 mg tablet Take 2 mg by mouth daily.       ??? pravastatin (PRAVACHOL) 40 mg tablet Take 40 mg by mouth nightly.       ??? insulin lispro (HUMALOG) 100 unit/mL kwikpen Inject 2-10 units three times daily  1 Package  11   ??? methocarbamol (ROBAXIN) 500 mg tablet TAKE 2 TABLETS 4 TIMES A DAY  100 Tab  0   ??? Cholecalciferol, Vitamin D3, (VITAMIN D3) 1,000 unit Cap Take 5,000 Units by mouth daily.       ??? omega-3 fatty acids-vitamin e (FISH OIL) 1,000 mg Cap Take 1,200 mg by mouth two (2) times a day.         Allergies   Allergen Reactions   ??? Morphine Anaphylaxis     Tolerates oxycodone without problem   ??? Avalide [Irbesartan-Hydrochlorothiazide] Myalgia   ??? Demerol [Meperidine] Other (comments)     Makes her feel crazy   ??? Pcn [Penicillins] Swelling   ??? Statins-Hmg-Coa  Reductase Inhibitors Myalgia   ??? Sulfa (Sulfonamide Antibiotics) Unknown (comments)   ??? Tricor [Fenofibrate Micronized] Myalgia     Past Medical History   Diagnosis Date   ??? Hypertension    ??? CAD (coronary artery disease)    ??? Chronic pain    ??? Arthritis    ??? COPD    ??? Diabetes    ??? Goiter 04/20/2012   ??? GERD (gastroesophageal reflux disease)    ??? Neuropathy 04/06/2013     Past Surgical History   Procedure Laterality Date   ??? Pr cardiac surg procedure unlist       PTCA   ??? 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 Hx knee arthroscopy Right      X2   ??? Hx other surgical  1970     GANGLION CYST   ??? Hx tonsillectomy  1963     Family History   Problem Relation Age of Onset   ??? Lung Disease Mother    ??? Stroke Father    ??? Lung Disease Brother      COPD   ??? Hypertension Brother    ??? Cancer Brother      PROSTATE   ??? Heart Disease Brother    ??? Diabetes Brother    ??? Anesth Problems Neg Hx      History   Substance Use Topics   ??? Smoking status: Current Every Day Smoker -- 0.30 packs/day for 45 years     Types: Cigarettes   ??? Smokeless tobacco: Never Used      Comment: passed ready   ??? Alcohol Use: Yes      Comment: may be 3 times a year if that          Objective:  BP 140/70  Heavy 62 yo wf  In NAD. A&O.  HEENT -- Pupils round.  O/P Clear.  Neck -- decreased rom due to pain, head tilted to left..  She gets pain when moving to right.  Tender on palpation over rt c5/6  Heart -- RRR. No R/M/G.  Lungs -- CTA.  Abdomen -- Soft. Non-tender. Non-distended. No masses. Bowel sounds present.  Extremities -- No edema.      Results for orders placed in visit on 10/31/13   AMB POC HEMOGLOBIN A1C       Result Value Range    Hemoglobin A1c (POC) 7.5 (*) 4.8 - 5.6 %   AMB POC URINALYSIS DIP STICK MANUAL W/O MICRO       Result Value Range    Color (UA POC) Yellow      Clarity (UA POC) Clear      Glucose (UA POC) Negative  Negative    Bilirubin (UA POC) Negative  Negative    Ketones (UA POC) Negative  Negative    Specific gravity (UA POC)  1.015  1.001 - 1.035    Blood (UA POC) Negative  Negative    pH (UA POC) 5.5  4.6 - 8.0    Protein (UA POC) Negative  Negative mg/dL    Urobilinogen (UA POC) 0.2 mg/dL  0.2 - 1    Nitrites (UA POC) Negative  Negative    Leukocyte esterase (UA POC) Trace  Negative         Assessment/Plan:    ICD-9-CM    1. Neck pain 723.1 traMADol (ULTRAM) 50 mg tablet     diazepam (VALIUM) 10 mg tablet     TRIAMCINOLONE ACETONIDE INJ     lidocaine, PF, (XYLOCAINE) 10 mg/mL (1 %) injection     PR DRAIN/INJECT INTERMEDIATE JOINT/BURSA   2. Chills 780.64 AMB POC URINALYSIS DIP STICK MANUAL W/O MICRO     PR COLLECTION VENOUS BLOOD,VENIPUNCTURE   3. DM w/o complication type II 250.00 AMB POC HEMOGLOBIN A1C   4. Mixed hyperlipidemia 272.2 TSH, 3RD GENERATION           Author:  Lake Bells, MD 10/31/2013 10:25 AM    Indications:   Symptom relief from osteoarthritis    Procedure:  After consent was obtained, using sterile technique the right c5/6 joint was prepped using Betadine. Local anesthetic used: 1% lidocaine.. The joint was entered  disposition:[id number]}.     Kenalog 20 mg was mixed with 1% lidocaine 1/2 ml  and injected into the joint and the needle withdrawn.  The procedure was well tolerated.  The patient is asked to continue to rest the joint for a few more days before resuming regular activities.  It may be more painful for the first 1-2 days.  Watch for fever, or increased swelling or persistent pain in the joint. Call or return to clinic prn if such symptoms occur or there is failure to improve as anticipated.

## 2013-10-31 NOTE — Patient Instructions (Addendum)
Results for orders placed in visit on 10/31/13   AMB POC HEMOGLOBIN A1C       Result Value Range    Hemoglobin A1c (POC) 7.5 (*) 4.8 - 5.6 %   AMB POC URINALYSIS DIP STICK MANUAL W/O MICRO       Result Value Range    Color (UA POC) Yellow      Clarity (UA POC) Clear      Glucose (UA POC) Negative  Negative    Bilirubin (UA POC) Negative  Negative    Ketones (UA POC) Negative  Negative    Specific gravity (UA POC) 1.015  1.001 - 1.035    Blood (UA POC) Negative  Negative    pH (UA POC) 5.5  4.6 - 8.0    Protein (UA POC) Negative  Negative mg/dL    Urobilinogen (UA POC) 0.2 mg/dL  0.2 - 1    Nitrites (UA POC) Negative  Negative    Leukocyte esterase (UA POC) Trace  Negative     Try tramadol every 6 hours, do not take valium with methocarbamol

## 2013-11-01 LAB — TSH 3RD GENERATION: TSH: 2.2 u[IU]/mL (ref 0.450–4.500)

## 2013-11-01 NOTE — Progress Notes (Signed)
Quick Note:    pls call- thyroid is fine, how are you feeling?  ______

## 2013-11-02 NOTE — Progress Notes (Signed)
Quick Note:    Called patient and relayed doctor's message, Patient is better but not OK, she believe it will continue to get better  ______

## 2013-11-23 NOTE — Telephone Encounter (Signed)
Kelsey [name] wants to know if you got her message from my chart  [phone]

## 2013-11-23 NOTE — Telephone Encounter (Signed)
Spoke with patient and sent in new script for 1 to 2 capsules 3 times daily

## 2013-11-24 NOTE — Progress Notes (Signed)
Kelsey [name] is a 62 y.o. female and presents with   Chief Complaint   Patient presents with   ??? Peripheral Neuropathy   .  Feet and legs hurting. Taking up to 9 neurontin daily.  Does not feel groggy, did not like feeling flat with the cymbalta so stopped that. Neck pain only minimally better after injection. Going to chiropracter .trying to stay active pain is 10/10 with nothing and 5/10 with neurontin.      Current Outpatient Prescriptions   Medication Sig Dispense Refill   ??? gabapentin (NEURONTIN) 300 mg capsule Take 3 capsules by mouth three (3) times daily.  810 capsule  1   ??? DULoxetine (CYMBALTA) 60 mg capsule Take 1 capsule by mouth daily.  90 capsule  1   ??? traMADol (ULTRAM) 50 mg tablet Take 2 tablets by mouth every six (6) hours as needed for Pain.  200 tablet  1   ??? lidocaine (LIDODERM) 5 %(700 mg/patch) 1 patch by TransDERmal route every twelve (12) hours. Apply patch to the affected area for 12 hours a day and remove for 12 hours a day.  30 Package  6   ??? pregabalin (LYRICA) 50 mg capsule Take 1 capsule by mouth two (2) times a day.  30 capsule  1   ??? alprazolam (XANAX) 1 mg tablet Take 1 tablet by mouth nightly as needed for Anxiety.  10 tablet  0   ??? CHANTIX 1 mg tablet USE AS DIRECTED  56 tablet  2   ??? metFORMIN ER (GLUCOPHAGE XR) 500 mg tablet TAKE 5 TABLETS BY MOUTH DAILY  450 tablet  1   ??? methocarbamol (ROBAXIN) 500 mg tablet TAKE 2 TABLETS 4 TIMES A DAY  100 Tab  0   ??? oxyCODONE-acetaminophen (PERCOCET) 5-325 mg per tablet Take 1-2 Tabs by mouth every four (4) hours as needed for Pain.  60 Tab  0   ??? multivitamin (ONE A DAY) tablet Take 1 Tab by mouth daily.       ??? cyanocobalamin (VITAMIN B-12) 1,000 mcg tablet Take 1,000 mcg by mouth daily.       ??? folic acid (FOLVITE) 1 mg tablet Take 1 mg by mouth daily.       ??? omeprazole (PRILOSEC) 40 mg capsule Take 40 mg by mouth nightly.       ??? ezetimibe (ZETIA) 10 mg tablet Take  by mouth nightly.       ??? diphenhydrAMINE (BENADRYL) 25 mg capsule  Take 25 mg by mouth every six (6) hours as needed.       ??? Ferrous Fumarate 325 mg (106 mg iron) Tab Take  by mouth daily.       ??? aspirin (ASPIRIN) 325 mg tablet Take 325 mg by mouth daily.       ??? insulin detemir (LEVEMIR) 100 unit/mL injection by SubCUTAneous route nightly. Inject 15 units at bedtime       ??? carvedilol (COREG) 6.25 mg tablet Take 6.25 mg by mouth daily.       ??? NITROSTAT 0.4 mg SL tablet TAKE 1 TABLET SL EVERY 5 MINUTES AS NEEDED FOR CHEST PAIN AS DIRECTED  50 Tab  1   ??? bumetanide (BUMEX) 2 mg tablet Take 2 mg by mouth daily.       ??? pravastatin (PRAVACHOL) 40 mg tablet Take 40 mg by mouth nightly.       ??? insulin lispro (HUMALOG) 100 unit/mL kwikpen Inject 2-10 units three times daily  1 Package  11   ??? methocarbamol (ROBAXIN) 500 mg tablet TAKE 2 TABLETS 4 TIMES A DAY  100 Tab  0   ??? Cholecalciferol, Vitamin D3, (VITAMIN D3) 1,000 unit Cap Take 5,000 Units by mouth daily.       ??? omega-3 fatty acids-vitamin e (FISH OIL) 1,000 mg Cap Take 1,200 mg by mouth two (2) times a day.         Allergies   Allergen Reactions   ??? Morphine Anaphylaxis     Tolerates oxycodone without problem   ??? Avalide [Irbesartan-Hydrochlorothiazide] Myalgia   ??? Demerol [Meperidine] Other (comments)     Makes her feel crazy   ??? Pcn [Penicillins] Swelling   ??? Statins-Hmg-Coa Reductase Inhibitors Myalgia   ??? Sulfa (Sulfonamide Antibiotics) Unknown (comments)   ??? Tricor [Fenofibrate Micronized] Myalgia     Past Medical History   Diagnosis Date   ??? Hypertension    ??? CAD (coronary artery disease)    ??? Chronic pain    ??? Arthritis    ??? COPD    ??? Diabetes    ??? Goiter 04/20/2012   ??? GERD (gastroesophageal reflux disease)    ??? Neuropathy 04/06/2013     Past Surgical History   Procedure Laterality Date   ??? Pr cardiac surg procedure unlist       PTCA   ??? 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 Hx knee arthroscopy Right      X2   ??? Hx other surgical  1970     GANGLION CYST   ??? Hx tonsillectomy  1963     Family History   Problem  Relation Age of Onset   ??? Lung Disease Mother    ??? Stroke Father    ??? Lung Disease Brother      COPD   ??? Hypertension Brother    ??? Cancer Brother      PROSTATE   ??? Heart Disease Brother    ??? Diabetes Brother    ??? Anesth Problems Neg Hx      History   Substance Use Topics   ??? Smoking status: Current Every Day Smoker -- 0.30 packs/day for 45 years     Types: Cigarettes   ??? Smokeless tobacco: Never Used      Comment: passed ready   ??? Alcohol Use: Yes      Comment: may be 3 times a year if that          Objective:  BP 150/80  Heavy 62 yo wf  In NAD. A&O.  HEENT -- Pupils round.  O/P Clear.  Neck -- Supple. No JVD.no acn  Heart -- RRR. No R/M/G.  Lungs -- Coarse breath sounds  Abdomen -- Soft. Non-tender. Non-distended. No masses. Bowel sounds present.  Extremities -- No edema.        Assessment/Plan:    ICD-9-CM    1. Neuropathy 355.9 gabapentin (NEURONTIN) 300 mg capsule     DULoxetine (CYMBALTA) 60 mg capsule   2. Neck pain 723.1 XR SPINE CERV MIN 6 VWS   3. Finger pain, right 729.5 XR SPINE CERV MIN 6 VWS     Restart cymbalta, continue neurontin, ok to take tramadol as long as you are careful driving.      Author:  Lake Bells, MD 11/24/2013 12:12 PM

## 2014-02-20 MED ORDER — PANTOPRAZOLE 40 MG TAB, DELAYED RELEASE
40 mg | ORAL_TABLET | ORAL | Status: DC
Start: 2014-02-20 — End: 2014-04-14

## 2014-02-20 MED ORDER — METFORMIN SR 500 MG 24 HR TABLET
500 mg | ORAL_TABLET | ORAL | Status: DC
Start: 2014-02-20 — End: 2014-08-30

## 2014-03-22 LAB — AMB POC URINALYSIS DIP STICK AUTO W/ MICRO
Bilirubin (UA POC): NEGATIVE
Blood (UA POC): NEGATIVE
Glucose (UA POC): NEGATIVE
Ketones (UA POC): NEGATIVE
Nitrites (UA POC): NEGATIVE
Protein (UA POC): NEGATIVE mg/dL
Specific gravity (UA POC): 1.01 (ref 1.001–1.035)
Urobilinogen (UA POC): 0.2 (ref 0.2–1)
pH (UA POC): 6 (ref 4.6–8.0)

## 2014-03-22 LAB — AMB POC COMPLETE CBC,AUTOMATED ENTER
ABS. GRANS (POC): 4.6 10*3/uL (ref 1.4–6.5)
ABS. LYMPHS (POC): 2.4 10*3/uL (ref 1.2–3.4)
ABS. MONOS (POC): 0.5 10*3/uL (ref 0.1–0.6)
GRANULOCYTES (POC): 61.7 % (ref 42.2–75.2)
HCT (POC): 36.3 % (ref 35.0–60.0)
HGB (POC): 11.7 g/dL (ref 11–18)
LYMPHOCYTES (POC): 31.9 % (ref 20.5–51.1)
MCH (POC): 29.1 pg (ref 27.0–31.0)
MCHC (POC): 32.3 g/dL — AB (ref 33.0–37.0)
MCV (POC): 90 fL (ref 80.0–99.9)
MONOCYTES (POC): 6.4 % (ref 1.7–9.3)
MPV (POC): 7.1 fL — AB (ref 7.8–11)
PLATELET (POC): 226 10*3/uL (ref 150–450)
RBC (POC): 4.04 M/uL (ref 4.00–6.00)
RDW (POC): 14 % — AB (ref 11.6–13.7)
WBC (POC): 7.4 10*3/uL (ref 4.5–10.5)

## 2014-03-22 LAB — AMB POC LIPID PROFILE
Cholesterol (POC): 141 mg/dL (ref 100–199)
HDL Cholesterol (POC): 57 mg/dL (ref 35–150)
LDL Cholesterol (POC): 23 mg/dL (ref 0–129)
Non-HDL Goal (POC): 84
TChol/HDL Ratio (POC): 2.5 CALC (ref 0.0–5.0)
Triglycerides (POC): 305 mg/dL — AB (ref 0–150)

## 2014-03-22 LAB — AMB POC HEMOGLOBIN A1C: Hemoglobin A1c (POC): 8.5 % — AB (ref 4.8–5.6)

## 2014-03-22 MED ORDER — CIPROFLOXACIN 500 MG TAB
500 mg | ORAL_TABLET | Freq: Two times a day (BID) | ORAL | Status: DC
Start: 2014-03-22 — End: 2014-11-22

## 2014-03-22 NOTE — Patient Instructions (Signed)
Results for orders placed in visit on 03/22/14   AMB POC HEMOGLOBIN A1C       Result Value Ref Range    Hemoglobin A1c (POC) 8.5 (*) 4.8 - 5.6 %   AMB POC COMPLETE CBC,AUTOMATED ENTER       Result Value Ref Range    WBC (POC) 7.4  4.5 - 10.5 K/uL    LYMPHOCYTES (POC) 31.9  20.5 - 51.1 %    MONOCYTES (POC) 6.4  1.7 - 9.3 %    GRANULOCYTES (POC) 61.7  42.2 - 75.2 %    ABS. LYMPHS (POC) 2.4  1.2 - 3.4 K/uL    ABS. MONOS (POC) 0.5  0.1 - 0.6 10^3/ul    ABS. GRANS (POC) 4.6  1.4 - 6.5 10^3/ul    RBC (POC) 4.04  4.00 - 6.00 M/uL    HGB (POC) 11.7  11 - 18 g/dL    HCT (POC) 11.936.3  14.735.0 - 60.0 %    MCV (POC) 90.0  80.0 - 99.9 fL    MCH (POC) 29.1  27.0 - 31.0 pg    MCHC (POC) 32.3 (*) 33.0 - 37.0 g/dL    RDW (POC) 82.914.0 (*) 56.211.6 - 13.7 %    PLATELET (POC) 226  150 - 450 K/uL    MPV (POC) 7.1 (*) 7.8 - 11 fL   AMB POC LIPID PROFILE       Result Value Ref Range    Cholesterol (POC) 141  100 - 199 mg/dL    Triglycerides (POC) 305 (*) 0 - 150 mg/dL    HDL Cholesterol (POC) 57  35 - 150 mg/dL    LDL Cholesterol (POC) 23  0 - 129 mg/dL    Non-HDL Goal (POC) 84      TChol/HDL Ratio (POC) 2.5  0.0 - 5.0 CALC     Take cipro for uti, follow up 7-10 days

## 2014-03-22 NOTE — Progress Notes (Signed)
Kelsey [name] is a 63 y.o. female and presents with   Chief Complaint   Patient presents with   ??? Follow-up   .  Pt has a list of complaints, She c/o she is dizzy,  Feels off balance,  Rt leg is "crooked " and feels that keeps her off balance.  No vertigo.  Feels like she is going to fall.  Feels like she is listing to one side.- the right.  Has not fallen, started to fall backwards.  The bad episodes have happened 3- 4 times over the last several months. bp and bs have been ok at the those times.    Behind both of her knees - she has bulges that hurt.  Has had surgery on rt. Pain is worse in rt knee.   also c/o pain in neck and pain radiating into rt thumb and hand.  - had ordered xray in Dec, but she  Did not get it done  Has pain in both feet  Has horrible short term memory.  Has meds set up in pill box.    Unable to work from a list in her head.  - has difficulty sleeping,   Has to get up 4-5 times a night to urinate. Feels weak all over.  Can't open a bottle.  Feels weak all over. Blood sugars staying high.   stays pretty depressed due to inactivity.    Current Outpatient Prescriptions   Medication Sig Dispense Refill   ??? ciprofloxacin HCl (CIPRO) 500 mg tablet Take 1 Tab by mouth two (2) times a day.  14 Tab  0   ??? metFORMIN ER (GLUCOPHAGE XR) 500 mg tablet TAKE 5 TABLETS BY MOUTH DAILY  450 Tab  1   ??? pantoprazole (PROTONIX) 40 mg tablet TAKE 1 TAB BY MOUTH DAILY **PA REQUIRED**  90 Tab  0   ??? CHANTIX CONTINUING MONTH BOX 1 mg tablet TAKE AS DIRECTED  56 tablet  0   ??? gabapentin (NEURONTIN) 300 mg capsule Take 3 capsules by mouth three (3) times daily.  810 capsule  1   ??? DULoxetine (CYMBALTA) 60 mg capsule Take 1 capsule by mouth daily.  90 capsule  1   ??? traMADol (ULTRAM) 50 mg tablet Take 2 tablets by mouth every six (6) hours as needed for Pain.  200 tablet  1   ??? lidocaine (LIDODERM) 5 %(700 mg/patch) 1 patch by TransDERmal route every twelve (12) hours. Apply patch to the affected area for 12 hours a  day and remove for 12 hours a day.  30 Package  6   ??? alprazolam (XANAX) 1 mg tablet Take 1 tablet by mouth nightly as needed for Anxiety.  10 tablet  0   ??? methocarbamol (ROBAXIN) 500 mg tablet TAKE 2 TABLETS 4 TIMES A DAY  100 Tab  0   ??? multivitamin (ONE A DAY) tablet Take 1 Tab by mouth daily.       ??? cyanocobalamin (VITAMIN B-12) 1,000 mcg tablet Take 1,000 mcg by mouth daily.       ??? folic acid (FOLVITE) 1 mg tablet Take 1 mg by mouth daily.       ??? ezetimibe (ZETIA) 10 mg tablet Take  by mouth nightly.       ??? diphenhydrAMINE (BENADRYL) 25 mg capsule Take 25 mg by mouth every six (6) hours as needed.       ??? Ferrous Fumarate 325 mg (106 mg iron) Tab Take  by mouth daily.       ???  aspirin (ASPIRIN) 325 mg tablet Take 325 mg by mouth daily.       ??? insulin detemir (LEVEMIR) 100 unit/mL injection 25 Units by SubCUTAneous route nightly. Inject 15 units at bedtime       ??? carvedilol (COREG) 6.25 mg tablet Take 6.25 mg by mouth daily.       ??? NITROSTAT 0.4 mg SL tablet TAKE 1 TABLET SL EVERY 5 MINUTES AS NEEDED FOR CHEST PAIN AS DIRECTED  50 Tab  1   ??? bumetanide (BUMEX) 2 mg tablet Take 2 mg by mouth daily.       ??? pravastatin (PRAVACHOL) 40 mg tablet Take 40 mg by mouth nightly.       ??? insulin lispro (HUMALOG) 100 unit/mL kwikpen Inject 2-10 units three times daily  1 Package  11   ??? methocarbamol (ROBAXIN) 500 mg tablet TAKE 2 TABLETS 4 TIMES A DAY  100 Tab  0   ??? Cholecalciferol, Vitamin D3, (VITAMIN D3) 1,000 unit Cap Take 5,000 Units by mouth daily.       ??? omega-3 fatty acids-vitamin e (FISH OIL) 1,000 mg Cap Take 1,200 mg by mouth two (2) times a day.         Allergies   Allergen Reactions   ??? Morphine Anaphylaxis     Tolerates oxycodone without problem   ??? Avalide [Irbesartan-Hydrochlorothiazide] Myalgia   ??? Demerol [Meperidine] Other (comments)     Makes her feel crazy   ??? Pcn [Penicillins] Swelling   ??? Statins-Hmg-Coa Reductase Inhibitors Myalgia   ??? Sulfa (Sulfonamide Antibiotics) Unknown (comments)   ???  Tricor [Fenofibrate Micronized] Myalgia     Past Medical History   Diagnosis Date   ??? Hypertension    ??? CAD (coronary artery disease)    ??? Chronic pain    ??? Arthritis    ??? COPD    ??? Diabetes    ??? Goiter 04/20/2012   ??? GERD (gastroesophageal reflux disease)    ??? Neuropathy 04/06/2013     Past Surgical History   Procedure Laterality Date   ??? Pr cardiac surg procedure unlist       PTCA   ??? 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 Hx knee arthroscopy Right      X2   ??? Hx other surgical  1970     GANGLION CYST   ??? Hx tonsillectomy  1963     Family History   Problem Relation Age of Onset   ??? Lung Disease Mother    ??? Stroke Father    ??? Lung Disease Brother      COPD   ??? Hypertension Brother    ??? Cancer Brother      PROSTATE   ??? Heart Disease Brother    ??? Diabetes Brother    ??? Anesth Problems Neg Hx      History   Substance Use Topics   ??? Smoking status: Current Every Day Smoker -- 0.30 packs/day for 45 years     Types: Cigarettes   ??? Smokeless tobacco: Never Used      Comment: passed ready   ??? Alcohol Use: Yes      Comment: may be 3 times a year if that          Objective:  BP 150/80    Ht 5' 5.5" (1.664 m)    Wt 215 lb (97.523 kg)    BMI 35.22 kg/m2     wdwn 62  yo wf  In NAD. A&O.  HEENT -- Pupils round.  O/P dry  mucousmembranes.   Neck -- Supple. No JVD.no acn  Heart -- RRR. No R/M/G.   Lungs -- Coarse breath sounds  Abdomen -- Soft. Non-tender. Non-distended. No masses. Bowel sounds present.  Extremities -- No edema. + 1 pulses   good strength, remembers 3/3 objects at 5 min     Assessment/Plan:    ICD-9-CM    1. Neck pain 723.1 XR SPINE CERV 4 OR 5 V   2. Mixed hyperlipidemia 272.2 AMB POC LIPID PROFILE   3. DM w/o complication type II 250.00 AMB POC HEMOGLOBIN A1C     PR COLLECTION VENOUS BLOOD,VENIPUNCTURE     AMB POC URINALYSIS DIP STICK AUTO W/ MICRO     CULTURE, URINE     CANCELED: AMB POC URINALYSIS DIP STICK MANUAL W/O MICRO   4. Dyspnea 786.09 AMB POC COMPLETE CBC,AUTOMATED ENTER     METABOLIC PANEL,  COMPREHENSIVE   5. Neuropathy 355.9    6. Pyuria 791.9 ciprofloxacin HCl (CIPRO) 500 mg tablet     Increase levemir to 30 units,try to cut back on neurontin.  F/u 7-10 days      Author:  Lake Bells, MD 03/22/2014 3:58 PM

## 2014-03-23 LAB — METABOLIC PANEL, COMPREHENSIVE
A-G Ratio: 2 (ref 1.1–2.5)
ALT (SGPT): 45 IU/L — ABNORMAL HIGH (ref 0–32)
AST (SGOT): 55 IU/L — ABNORMAL HIGH (ref 0–40)
Albumin: 4.3 g/dL (ref 3.6–4.8)
Alk. phosphatase: 69 IU/L (ref 39–117)
BUN/Creatinine ratio: 20 (ref 11–26)
BUN: 19 mg/dL (ref 8–27)
Bilirubin, total: 0.3 mg/dL (ref 0.0–1.2)
CO2: 27 mmol/L (ref 18–29)
Calcium: 9.7 mg/dL (ref 8.7–10.3)
Chloride: 99 mmol/L (ref 97–108)
Creatinine: 0.93 mg/dL (ref 0.57–1.00)
GFR est AA: 76 mL/min/{1.73_m2} (ref >59)
GFR est non-AA: 66 mL/min/{1.73_m2} (ref >59)
GLOBULIN, TOTAL: 2.2 g/dL (ref 1.5–4.5)
Glucose: 130 mg/dL — ABNORMAL HIGH (ref 65–99)
Potassium: 4.6 mmol/L (ref 3.5–5.2)
Protein, total: 6.5 g/dL (ref 6.0–8.5)
Sodium: 141 mmol/L (ref 134–144)

## 2014-03-24 LAB — CULTURE, URINE

## 2014-03-28 MED ORDER — PANTOPRAZOLE 40 MG TAB, DELAYED RELEASE
40 mg | ORAL_TABLET | ORAL | Status: DC
Start: 2014-03-28 — End: 2014-09-29

## 2014-03-31 NOTE — Progress Notes (Signed)
Kelsey [name] is a 63 y.o. female and presents with   Chief Complaint   Patient presents with   ??? Follow-up     1 wk   .  Pt feeling sl better,  Did not bring blood sugars, blood sugars this am 180 taking sliding scale at dinner.  Is less dizzy, still having neck and knee pain,  Has appt with Dr. Sedonia Small next week as well as Dr. Katrinka Blazing for knee.       Current Outpatient Prescriptions   Medication Sig Dispense Refill   ??? pantoprazole (PROTONIX) 40 mg tablet TAKE 1 TAB BY MOUTH DAILY **PA REQUIRED**  90 Tab  0   ??? metFORMIN ER (GLUCOPHAGE XR) 500 mg tablet TAKE 5 TABLETS BY MOUTH DAILY  450 Tab  1   ??? pantoprazole (PROTONIX) 40 mg tablet TAKE 1 TAB BY MOUTH DAILY **PA REQUIRED**  90 Tab  0   ??? CHANTIX CONTINUING MONTH BOX 1 mg tablet TAKE AS DIRECTED  56 tablet  0   ??? DULoxetine (CYMBALTA) 60 mg capsule Take 1 capsule by mouth daily.  90 capsule  1   ??? traMADol (ULTRAM) 50 mg tablet Take 2 tablets by mouth every six (6) hours as needed for Pain.  200 tablet  1   ??? lidocaine (LIDODERM) 5 %(700 mg/patch) 1 patch by TransDERmal route every twelve (12) hours. Apply patch to the affected area for 12 hours a day and remove for 12 hours a day.  30 Package  6   ??? alprazolam (XANAX) 1 mg tablet Take 1 tablet by mouth nightly as needed for Anxiety.  10 tablet  0   ??? methocarbamol (ROBAXIN) 500 mg tablet TAKE 2 TABLETS 4 TIMES A DAY  100 Tab  0   ??? multivitamin (ONE A DAY) tablet Take 1 Tab by mouth daily.       ??? cyanocobalamin (VITAMIN B-12) 1,000 mcg tablet Take 1,000 mcg by mouth daily.       ??? folic acid (FOLVITE) 1 mg tablet Take 1 mg by mouth daily.       ??? ezetimibe (ZETIA) 10 mg tablet Take  by mouth nightly.       ??? diphenhydrAMINE (BENADRYL) 25 mg capsule Take 25 mg by mouth every six (6) hours as needed.       ??? Ferrous Fumarate 325 mg (106 mg iron) Tab Take  by mouth daily.       ??? aspirin (ASPIRIN) 325 mg tablet Take 325 mg by mouth daily.       ??? insulin detemir (LEVEMIR) 100 unit/mL injection 25 Units by  SubCUTAneous route nightly. Inject 15 units at bedtime       ??? carvedilol (COREG) 6.25 mg tablet Take 6.25 mg by mouth daily.       ??? NITROSTAT 0.4 mg SL tablet TAKE 1 TABLET SL EVERY 5 MINUTES AS NEEDED FOR CHEST PAIN AS DIRECTED  50 Tab  1   ??? bumetanide (BUMEX) 2 mg tablet Take 2 mg by mouth daily.       ??? pravastatin (PRAVACHOL) 40 mg tablet Take 40 mg by mouth nightly.       ??? insulin lispro (HUMALOG) 100 unit/mL kwikpen Inject 2-10 units three times daily  1 Package  11   ??? methocarbamol (ROBAXIN) 500 mg tablet TAKE 2 TABLETS 4 TIMES A DAY  100 Tab  0   ??? Cholecalciferol, Vitamin D3, (VITAMIN D3) 1,000 unit Cap Take 5,000 Units by mouth daily.       ???  omega-3 fatty acids-vitamin e (FISH OIL) 1,000 mg Cap Take 1,200 mg by mouth two (2) times a day.       ??? ciprofloxacin HCl (CIPRO) 500 mg tablet Take 1 Tab by mouth two (2) times a day.  14 Tab  0   ??? gabapentin (NEURONTIN) 300 mg capsule Take 3 capsules by mouth three (3) times daily.  810 capsule  1     Allergies   Allergen Reactions   ??? Morphine Anaphylaxis     Tolerates oxycodone without problem   ??? Avalide [Irbesartan-Hydrochlorothiazide] Myalgia   ??? Demerol [Meperidine] Other (comments)     Makes her feel crazy   ??? Pcn [Penicillins] Swelling   ??? Statins-Hmg-Coa Reductase Inhibitors Myalgia   ??? Sulfa (Sulfonamide Antibiotics) Unknown (comments)   ??? Tricor [Fenofibrate Micronized] Myalgia     Past Medical History   Diagnosis Date   ??? Hypertension    ??? CAD (coronary artery disease)    ??? Chronic pain    ??? Arthritis    ??? COPD    ??? Diabetes (HCC)    ??? Goiter 04/20/2012   ??? GERD (gastroesophageal reflux disease)    ??? Neuropathy (HCC) 04/06/2013   ??? Cervical spondylolysis      Past Surgical History   Procedure Laterality Date   ??? Pr cardiac surg procedure unlist       PTCA   ??? 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 Hx knee arthroscopy Right      X2   ??? Hx other surgical  1970     GANGLION CYST   ??? Hx tonsillectomy  1963     Family History   Problem Relation  Age of Onset   ??? Lung Disease Mother    ??? Stroke Father    ??? Lung Disease Brother      COPD   ??? Hypertension Brother    ??? Cancer Brother      PROSTATE   ??? Heart Disease Brother    ??? Diabetes Brother    ??? Anesth Problems Neg Hx      History   Substance Use Topics   ??? Smoking status: Current Every Day Smoker -- 0.30 packs/day for 45 years     Types: Cigarettes   ??? Smokeless tobacco: Never Used      Comment: passed ready   ??? Alcohol Use: Yes      Comment: may be 3 times a year if that          Objective:  BP 100/60    Ht 5' 5.5" (1.664 m)    Wt 219 lb (99.338 kg)    BMI 35.88 kg/m2     wdwn 63 yo wf  In NAD. A&O.  HEENT -- Pupils round.  O/P Clear.  Neck -- Supple. No JVD.no acn  Heart -- RRR. No R/M/G.  Lungs -- CTA.  Abdomen -- Soft. Non-tender. Non-distended. No masses. Bowel sounds present.  Extremities -- No edema. Good pulses        Assessment/Plan:    ICD-9-CM   1. DM w/o complication type II (HCC) 250.00   2. Mixed hyperlipidemia 272.2     Increase levemir to 34 units. Follow up one month, neck injection      Author:  Lake BellsNancy J Nabil Bubolz, MD 03/31/2014 12:54 PM

## 2014-03-31 NOTE — Patient Instructions (Signed)
Increase levemir to 34 units at night.  Neck injection

## 2014-04-07 MED ORDER — INSULIN DETEMIR 100 UNIT/ML INJECTION
100 unit/mL | Freq: Every evening | SUBCUTANEOUS | Status: DC
Start: 2014-04-07 — End: 2015-02-25

## 2014-04-07 NOTE — Telephone Encounter (Signed)
Changed file to reflect dose increase, sent in new script to pharmacy

## 2014-04-14 NOTE — Patient Instructions (Signed)
Increase dinner insulin by 1-2 units

## 2014-04-14 NOTE — Progress Notes (Signed)
Kelsey [name] is a 63 y.o. female and presents with   Chief Complaint   Patient presents with   ??? Follow-up     2 wks   .  Feeling somewhat better,  Still having numbness in hand from her neck.. [facility] - they would not do cervical spines, only lumbar.  Friend rec seeing neurosurgeon.    Blood sugars in am 100-200, under 160 during the day, but 200's at bedtime. Has skin tags and moles.- wants them checked.    Has seen cardiologist. scheduled for dopplers   Pt c/o frequency of urination. And some incontinence.    Has cramps at night in thighs.  - started right atfer we increased her levemir.   Reviewed meds.  Says she is not smoking. Would like samples of insulin      Current Outpatient Prescriptions   Medication Sig Dispense Refill   ??? insulin detemir (LEVEMIR) 100 unit/mL injection 0.25 mL by SubCUTAneous route nightly. Inject 34 units at bedtime  DX:  250.00  10 mL  11   ??? pantoprazole (PROTONIX) 40 mg tablet TAKE 1 TAB BY MOUTH DAILY **PA REQUIRED**  90 Tab  0   ??? ciprofloxacin HCl (CIPRO) 500 mg tablet Take 1 Tab by mouth two (2) times a day.  14 Tab  0   ??? metFORMIN ER (GLUCOPHAGE XR) 500 mg tablet TAKE 5 TABLETS BY MOUTH DAILY  450 Tab  1   ??? gabapentin (NEURONTIN) 300 mg capsule Take 3 capsules by mouth three (3) times daily.  810 capsule  1   ??? DULoxetine (CYMBALTA) 60 mg capsule Take 1 capsule by mouth daily.  90 capsule  1   ??? traMADol (ULTRAM) 50 mg tablet Take 2 tablets by mouth every six (6) hours as needed for Pain.  200 tablet  1   ??? lidocaine (LIDODERM) 5 %(700 mg/patch) 1 patch by TransDERmal route every twelve (12) hours. Apply patch to the affected area for 12 hours a day and remove for 12 hours a day.  30 Package  6   ??? alprazolam (XANAX) 1 mg tablet Take 1 tablet by mouth nightly as needed for Anxiety.  10 tablet  0   ??? methocarbamol (ROBAXIN) 500 mg tablet TAKE 2 TABLETS 4 TIMES A DAY  100 Tab  0   ??? multivitamin (ONE A DAY) tablet Take 1 Tab by mouth daily.       ???  cyanocobalamin (VITAMIN B-12) 1,000 mcg tablet Take 1,000 mcg by mouth daily.       ??? folic acid (FOLVITE) 1 mg tablet Take 1 mg by mouth daily.       ??? ezetimibe (ZETIA) 10 mg tablet Take  by mouth nightly.       ??? diphenhydrAMINE (BENADRYL) 25 mg capsule Take 25 mg by mouth every six (6) hours as needed.       ??? Ferrous Fumarate 325 mg (106 mg iron) Tab Take  by mouth daily.       ??? aspirin (ASPIRIN) 325 mg tablet Take 325 mg by mouth daily.       ??? carvedilol (COREG) 6.25 mg tablet Take 6.25 mg by mouth daily.       ??? NITROSTAT 0.4 mg SL tablet TAKE 1 TABLET SL EVERY 5 MINUTES AS NEEDED FOR CHEST PAIN AS DIRECTED  50 Tab  1   ??? bumetanide (BUMEX) 2 mg tablet Take 2 mg by mouth daily.       ???  pravastatin (PRAVACHOL) 40 mg tablet Take 40 mg by mouth nightly.       ??? insulin lispro (HUMALOG) 100 unit/mL kwikpen Inject 2-10 units three times daily  1 Package  11   ??? methocarbamol (ROBAXIN) 500 mg tablet TAKE 2 TABLETS 4 TIMES A DAY  100 Tab  0   ??? Cholecalciferol, Vitamin D3, (VITAMIN D3) 1,000 unit Cap Take 5,000 Units by mouth daily.       ??? omega-3 fatty acids-vitamin e (FISH OIL) 1,000 mg Cap Take 1,200 mg by mouth two (2) times a day.         Allergies   Allergen Reactions   ??? Morphine Anaphylaxis     Tolerates oxycodone without problem   ??? Avalide [Irbesartan-Hydrochlorothiazide] Myalgia   ??? Demerol [Meperidine] Other (comments)     Makes her feel crazy   ??? Pcn [Penicillins] Swelling   ??? Statins-Hmg-Coa Reductase Inhibitors Myalgia   ??? Sulfa (Sulfonamide Antibiotics) Unknown (comments)   ??? Tricor [Fenofibrate Micronized] Myalgia     Past Medical History   Diagnosis Date   ??? Hypertension    ??? CAD (coronary artery disease)    ??? Chronic pain    ??? Arthritis    ??? COPD    ??? Diabetes (HCC)    ??? Goiter 04/20/2012   ??? GERD (gastroesophageal reflux disease)    ??? Neuropathy (HCC) 04/06/2013   ??? Cervical spondylolysis      Past Surgical History   Procedure Laterality Date   ??? Pr cardiac surg procedure unlist       PTCA   ???  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 Hx knee arthroscopy Right      X2   ??? Hx other surgical  1970     GANGLION CYST   ??? Hx tonsillectomy  1963     Family History   Problem Relation Age of Onset   ??? Lung Disease Mother    ??? Stroke Father    ??? Lung Disease Brother      COPD   ??? Hypertension Brother    ??? Cancer Brother      PROSTATE   ??? Heart Disease Brother    ??? Diabetes Brother    ??? Anesth Problems Neg Hx      History   Substance Use Topics   ??? Smoking status: Current Every Day Smoker -- 0.30 packs/day for 45 years     Types: Cigarettes   ??? Smokeless tobacco: Never Used      Comment: passed ready   ??? Alcohol Use: Yes      Comment: may be 3 times a year if that          Objective:  BP 150/84   Ht 5' 5.5" (1.664 m)   Wt 219 lb (99.338 kg)   BMI 35.88 kg/m2  wdwn 63 yo wf- nad   In NAD. A&O.  HEENT -- Pupils round.  O/P Clear.  Neck -- Supple. No JVD.  Heart -- RRR. No R/M/G.  Lungs -- CTA.  Abdomen -- Soft. Non-tender. Non-distended. No masses. Bowel sounds present.  Extremities -- No edema.        Assessment/Plan:    ICD-9-CM    1. Hand numbness 782.0 MRI CERV SPINE WO CONT     REFERRAL TO NEUROSURGERY   2. Skin cancer 173.90 REFERRAL TO DERMATOLOGY   3. Incontinence 788.30 REFERRAL TO UROLOGY     Continue to work  on diet and exercise.  Increase dinner insulin by 1-2 units to get bedtime bs down.      Author:  Lake BellsNancy J Kaneisha Ellenberger, MD 04/14/2014 1:01 PM

## 2014-04-21 NOTE — Progress Notes (Signed)
Quick Note:    i called, pt will see Dr. Stacie AcresMayer  ______

## 2014-05-03 NOTE — Telephone Encounter (Signed)
[facility]  Reason [facility] call:  Waiting on reply for refill request for Tramadol.  # [phone]

## 2014-05-03 NOTE — Telephone Encounter (Signed)
Called in refill with Dr's approval same as last time

## 2014-05-04 MED ORDER — METHOCARBAMOL 500 MG TAB
500 mg | ORAL_TABLET | ORAL | Status: DC
Start: 2014-05-04 — End: 2014-12-16

## 2014-05-05 NOTE — Telephone Encounter (Signed)
Reason for call:  Pt is having a CT scan.  Needs to stop Metformin 24 hours before and not take it until 48 hours after.  Should she increase insulin.  Please advise.  # [phone]

## 2014-05-05 NOTE — Telephone Encounter (Signed)
Please advise patient  [phone]

## 2014-05-05 NOTE — Telephone Encounter (Signed)
Yes use sliding scale insulin off metformin,

## 2014-06-02 LAB — AMB POC COMPLETE CBC,AUTOMATED ENTER
ABS. GRANS (POC): 4 10*3/uL (ref 1.4–6.5)
ABS. LYMPHS (POC): 1.4 10*3/uL (ref 1.2–3.4)
ABS. MONOS (POC): 0.3 10*3/uL (ref 0.1–0.6)
GRANULOCYTES (POC): 70.1 % (ref 42.2–75.2)
HCT (POC): 37.3 % (ref 35.0–60.0)
HGB (POC): 12.1 g/dL (ref 11–18)
LYMPHOCYTES (POC): 25.4 % (ref 20.5–51.1)
MCH (POC): 28.8 pg (ref 27.0–31.0)
MCHC (POC): 32.3 g/dL — AB (ref 33.0–37.0)
MCV (POC): 89 fL (ref 80.0–99.9)
MONOCYTES (POC): 4.5 % (ref 1.7–9.3)
MPV (POC): 6.1 fL — AB (ref 7.8–11)
PLATELET (POC): 239 10*3/uL (ref 150–450)
RBC (POC): 4.19 M/uL (ref 4.00–6.00)
RDW (POC): 14.7 % — AB (ref 11.6–13.7)
WBC (POC): 5.7 10*3/uL (ref 4.5–10.5)

## 2014-06-02 LAB — AMB POC HEMOGLOBIN A1C: Hemoglobin A1c (POC): 7.6 % — AB (ref 4.8–5.6)

## 2014-06-02 LAB — AMB POC URINALYSIS DIP STICK MANUAL W/O MICRO
Bilirubin (UA POC): NEGATIVE
Blood (UA POC): NEGATIVE
Glucose (UA POC): NEGATIVE
Ketones (UA POC): NEGATIVE
Nitrites (UA POC): NEGATIVE
Protein (UA POC): NEGATIVE mg/dL
Specific gravity (UA POC): 1.005 (ref 1.001–1.035)
Urobilinogen (UA POC): 0.2 (ref 0.2–1)
pH (UA POC): 6.5 (ref 4.6–8.0)

## 2014-06-02 NOTE — Patient Instructions (Signed)
Results for orders placed in visit on 06/02/14   AMB POC URINALYSIS DIP STICK MANUAL W/O MICRO       Result Value Ref Range    Color (UA POC) Yellow      Clarity (UA POC) Clear      Glucose (UA POC) Negative  Negative    Bilirubin (UA POC) Negative  Negative    Ketones (UA POC) Negative  Negative    Specific gravity (UA POC) 1.005  1.001 - 1.035    Blood (UA POC) Negative  Negative    pH (UA POC) 6.5  4.6 - 8.0    Protein (UA POC) Negative  Negative mg/dL    Urobilinogen (UA POC) 0.2 mg/dL  0.2 - 1    Nitrites (UA POC) Negative  Negative    Leukocyte esterase (UA POC) Trace  Negative   AMB POC HEMOGLOBIN A1C       Result Value Ref Range    Hemoglobin A1c (POC) 7.6 (*) 4.8 - 5.6 %   AMB POC COMPLETE CBC,AUTOMATED ENTER       Result Value Ref Range    WBC (POC) 5.7  4.5 - 10.5 K/uL    LYMPHOCYTES (POC) 25.4  20.5 - 51.1 %    MONOCYTES (POC) 4.5  1.7 - 9.3 %    GRANULOCYTES (POC) 70.1  42.2 - 75.2 %    ABS. LYMPHS (POC) 1.4  1.2 - 3.4 K/uL    ABS. MONOS (POC) 0.3  0.1 - 0.6 10^3/ul    ABS. GRANS (POC) 4.0  1.4 - 6.5 10^3/ul    RBC (POC) 4.19  4.00 - 6.00 M/uL    HGB (POC) 12.1  11 - 18 g/dL    HCT (POC) 16.137.3  09.635.0 - 60.0 %    MCV (POC) 89.0  80.0 - 99.9 fL    MCH (POC) 28.8  27.0 - 31.0 pg    MCHC (POC) 32.3 (*) 33.0 - 37.0 g/dL    RDW (POC) 04.514.7 (*) 40.911.6 - 13.7 %    PLATELET (POC) 239  150 - 450 K/uL    MPV (POC) 6.1 (*) 7.8 - 11 fL

## 2014-06-02 NOTE — Progress Notes (Signed)
Kelsey [name] is a 63 y.o. female and presents with   Chief Complaint   Patient presents with   ??? GI Problem     Possible Gallbladder problems   .  Tues pm started vomiting, ?fever, continued  For 36 hours,  Felt shaky, ate chicken soup and crackers last pm.  Had same thing 2 weeks ago but not as bad.   Blood sugars have been high for the last month. Waking up with blood sugars over 150, taking levemir  34 units at night.  Leg has been sore since angioplasty of legs last week,  Has seen neurosurg for neck pain- rec PT which is helping      Current Outpatient Prescriptions   Medication Sig Dispense Refill   ??? methocarbamol (ROBAXIN) 500 mg tablet TAKE 2 TABLETS BY MOUTH 4 TIMES A DAY  100 Tab  0   ??? insulin detemir (LEVEMIR) 100 unit/mL injection 0.25 mL by SubCUTAneous route nightly. Inject 34 units at bedtime  DX:  250.00  10 mL  11   ??? pantoprazole (PROTONIX) 40 mg tablet TAKE 1 TAB BY MOUTH DAILY **PA REQUIRED**  90 Tab  0   ??? ciprofloxacin HCl (CIPRO) 500 mg tablet Take 1 Tab by mouth two (2) times a day.  14 Tab  0   ??? metFORMIN ER (GLUCOPHAGE XR) 500 mg tablet TAKE 5 TABLETS BY MOUTH DAILY  450 Tab  1   ??? gabapentin (NEURONTIN) 300 mg capsule Take 3 capsules by mouth three (3) times daily.  810 capsule  1   ??? DULoxetine (CYMBALTA) 60 mg capsule Take 1 capsule by mouth daily.  90 capsule  1   ??? traMADol (ULTRAM) 50 mg tablet Take 2 tablets by mouth every six (6) hours as needed for Pain.  200 tablet  1   ??? lidocaine (LIDODERM) 5 %(700 mg/patch) 1 patch by TransDERmal route every twelve (12) hours. Apply patch to the affected area for 12 hours a day and remove for 12 hours a day.  30 Package  6   ??? alprazolam (XANAX) 1 mg tablet Take 1 tablet by mouth nightly as needed for Anxiety.  10 tablet  0   ??? multivitamin (ONE A DAY) tablet Take 1 Tab by mouth daily.       ??? cyanocobalamin (VITAMIN B-12) 1,000 mcg tablet Take 1,000 mcg by mouth daily.       ??? folic acid (FOLVITE) 1 mg tablet Take 1 mg by mouth daily.        ??? ezetimibe (ZETIA) 10 mg tablet Take  by mouth nightly.       ??? diphenhydrAMINE (BENADRYL) 25 mg capsule Take 25 mg by mouth every six (6) hours as needed.       ??? Ferrous Fumarate 325 mg (106 mg iron) Tab Take  by mouth daily.       ??? aspirin (ASPIRIN) 325 mg tablet Take 325 mg by mouth daily.       ??? carvedilol (COREG) 6.25 mg tablet Take 6.25 mg by mouth daily.       ??? NITROSTAT 0.4 mg SL tablet TAKE 1 TABLET SL EVERY 5 MINUTES AS NEEDED FOR CHEST PAIN AS DIRECTED  50 Tab  1   ??? bumetanide (BUMEX) 2 mg tablet Take 2 mg by mouth daily.       ??? pravastatin (PRAVACHOL) 40 mg tablet Take 40 mg by mouth nightly.       ??? insulin lispro (HUMALOG) 100 unit/mL  kwikpen Inject 2-10 units three times daily  1 Package  11   ??? methocarbamol (ROBAXIN) 500 mg tablet TAKE 2 TABLETS 4 TIMES A DAY  100 Tab  0   ??? Cholecalciferol, Vitamin D3, (VITAMIN D3) 1,000 unit Cap Take 5,000 Units by mouth daily.       ??? omega-3 fatty acids-vitamin e (FISH OIL) 1,000 mg Cap Take 1,200 mg by mouth two (2) times a day.         Allergies   Allergen Reactions   ??? Morphine Anaphylaxis     Tolerates oxycodone without problem   ??? Avalide [Irbesartan-Hydrochlorothiazide] Myalgia   ??? Demerol [Meperidine] Other (comments)     Makes her feel crazy   ??? Pcn [Penicillins] Swelling   ??? Statins-Hmg-Coa Reductase Inhibitors Myalgia   ??? Sulfa (Sulfonamide Antibiotics) Unknown (comments)   ??? Tricor [Fenofibrate Micronized] Myalgia     Past Medical History   Diagnosis Date   ??? Hypertension    ??? CAD (coronary artery disease)    ??? Chronic pain    ??? Arthritis    ??? COPD    ??? Diabetes (HCC)    ??? Goiter 04/20/2012   ??? GERD (gastroesophageal reflux disease)    ??? Neuropathy (HCC) 04/06/2013   ??? Cervical spondylolysis      Past Surgical History   Procedure Laterality Date   ??? Pr cardiac surg procedure unlist       PTCA   ??? 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 Hx knee arthroscopy Right      X2   ??? Hx other surgical  1970     GANGLION CYST   ??? Hx tonsillectomy   1963     Family History   Problem Relation Age of Onset   ??? Lung Disease Mother    ??? Stroke Father    ??? Lung Disease Brother      COPD   ??? Hypertension Brother    ??? Cancer Brother      PROSTATE   ??? Heart Disease Brother    ??? Diabetes Brother    ??? Anesth Problems Neg Hx      History   Substance Use Topics   ??? Smoking status: Current Every Day Smoker -- 0.30 packs/day for 45 years     Types: Cigarettes   ??? Smokeless tobacco: Never Used      Comment: passed ready   ??? Alcohol Use: Yes      Comment: may be 3 times a year if that          Objective:  BP 150/80   Ht 5' 5.5" (1.664 m)   Wt 216 lb (97.977 kg)   BMI 35.38 kg/m2  Heavy 63 yo wf  In NAD. A&O.  HEENT -- Pupils round.  O/P Clear.  Neck -- Supple. No JVD.no acn  Heart -- RRR. No R/M/G.  Lungs -- CTA.  Abdomen -- Soft. ruq tenderness, no cva tenderness, left lower quad tenderness.. Non-distended. No masses. Bowel sounds present.  Extremities -- No edema.     Results for orders placed in visit on 06/02/14   AMB POC URINALYSIS DIP STICK MANUAL W/O MICRO       Result Value Ref Range    Color (UA POC) Yellow      Clarity (UA POC) Clear      Glucose (UA POC) Negative  Negative    Bilirubin (UA POC) Negative  Negative  Ketones (UA POC) Negative  Negative    Specific gravity (UA POC) 1.005  1.001 - 1.035    Blood (UA POC) Negative  Negative    pH (UA POC) 6.5  4.6 - 8.0    Protein (UA POC) Negative  Negative mg/dL    Urobilinogen (UA POC) 0.2 mg/dL  0.2 - 1    Nitrites (UA POC) Negative  Negative    Leukocyte esterase (UA POC) Trace  Negative   AMB POC HEMOGLOBIN A1C       Result Value Ref Range    Hemoglobin A1c (POC) 7.6 (*) 4.8 - 5.6 %   AMB POC COMPLETE CBC,AUTOMATED ENTER       Result Value Ref Range    WBC (POC) 5.7  4.5 - 10.5 K/uL    LYMPHOCYTES (POC) 25.4  20.5 - 51.1 %    MONOCYTES (POC) 4.5  1.7 - 9.3 %    GRANULOCYTES (POC) 70.1  42.2 - 75.2 %    ABS. LYMPHS (POC) 1.4  1.2 - 3.4 K/uL    ABS. MONOS (POC) 0.3  0.1 - 0.6 10^3/ul    ABS. GRANS (POC) 4.0  1.4 -  6.5 10^3/ul    RBC (POC) 4.19  4.00 - 6.00 M/uL    HGB (POC) 12.1  11 - 18 g/dL    HCT (POC) 16.137.3  09.635.0 - 60.0 %    MCV (POC) 89.0  80.0 - 99.9 fL    MCH (POC) 28.8  27.0 - 31.0 pg    MCHC (POC) 32.3 (*) 33.0 - 37.0 g/dL    RDW (POC) 04.514.7 (*) 40.911.6 - 13.7 %    PLATELET (POC) 239  150 - 450 K/uL    MPV (POC) 6.1 (*) 7.8 - 11 fL          Assessment/Plan:    ICD-9-CM    1. Abdominal pain, unspecified abdominal location 789.00 AMB POC URINALYSIS DIP STICK MANUAL W/O MICRO     AMB POC HEMOGLOBIN A1C     PR COLLECTION VENOUS BLOOD,VENIPUNCTURE     METABOLIC PANEL, COMPREHENSIVE     AMB POC COMPLETE CBC,AUTOMATED ENTER     AMYLASE     US ABD LTD   2. Neuropathy (HCC) 355.9      Increase levemir to 36 units, set up ruq us      Author:  Lake BellsNancy J Rey Dansby, MD 06/02/2014 4:06 PM

## 2014-06-03 LAB — METABOLIC PANEL, COMPREHENSIVE
A-G Ratio: 2 (ref 1.1–2.5)
ALT (SGPT): 53 IU/L — ABNORMAL HIGH (ref 0–32)
AST (SGOT): 115 IU/L — ABNORMAL HIGH (ref 0–40)
Albumin: 4.1 g/dL (ref 3.6–4.8)
Alk. phosphatase: 70 IU/L (ref 39–117)
BUN/Creatinine ratio: 21 (ref 11–26)
BUN: 19 mg/dL (ref 8–27)
Bilirubin, total: 0.2 mg/dL (ref 0.0–1.2)
CO2: 22 mmol/L (ref 18–29)
Calcium: 8.9 mg/dL (ref 8.7–10.3)
Chloride: 95 mmol/L — ABNORMAL LOW (ref 97–108)
Creatinine: 0.91 mg/dL (ref 0.57–1.00)
GFR est AA: 78 mL/min/{1.73_m2} (ref >59)
GFR est non-AA: 67 mL/min/{1.73_m2} (ref >59)
GLOBULIN, TOTAL: 2.1 g/dL (ref 1.5–4.5)
Glucose: 88 mg/dL (ref 65–99)
Potassium: 4.2 mmol/L (ref 3.5–5.2)
Protein, total: 6.2 g/dL (ref 6.0–8.5)
Sodium: 135 mmol/L (ref 134–144)

## 2014-06-03 LAB — AMYLASE: Amylase: 46 U/L (ref 31–124)

## 2014-06-14 MED ORDER — CHANTIX CONTINUING MONTH BOX 1 MG TABLET
1 mg | ORAL_TABLET | ORAL | Status: DC
Start: 2014-06-14 — End: 2014-07-24

## 2014-06-14 NOTE — Progress Notes (Signed)
Quick Note:        Called patient and relayed doctor's message    ______

## 2014-06-14 NOTE — Progress Notes (Signed)
Quick Note:        pls call- blood sugars better, keep working on it, Liver enzymes sl elevated. We will recheck next appt.    ______

## 2014-07-24 MED ORDER — CHANTIX CONTINUING MONTH BOX 1 MG TABLET
1 mg | ORAL_TABLET | ORAL | Status: DC
Start: 2014-07-24 — End: 2014-08-24

## 2014-08-24 MED ORDER — CHANTIX CONTINUING MONTH BOX 1 MG TABLET
1 mg | ORAL_TABLET | ORAL | Status: DC
Start: 2014-08-24 — End: 2014-09-18

## 2014-08-29 MED ORDER — GABAPENTIN 300 MG CAP
300 mg | ORAL_CAPSULE | ORAL | Status: DC
Start: 2014-08-29 — End: 2015-06-16

## 2014-08-31 MED ORDER — METFORMIN SR 500 MG 24 HR TABLET
500 mg | ORAL_TABLET | ORAL | Status: DC
Start: 2014-08-31 — End: 2015-03-16

## 2014-09-18 MED ORDER — CHANTIX CONTINUING MONTH BOX 1 MG TABLET
1 mg | ORAL_TABLET | ORAL | Status: DC
Start: 2014-09-18 — End: 2014-10-26

## 2014-09-29 MED ORDER — PANTOPRAZOLE 40 MG TAB, DELAYED RELEASE
40 mg | ORAL_TABLET | ORAL | Status: DC
Start: 2014-09-29 — End: 2014-12-25

## 2014-10-11 NOTE — Telephone Encounter (Signed)
Pt due for follow up.  i will refill her meds when she comes in.  ?this aft?

## 2014-10-11 NOTE — Telephone Encounter (Signed)
Rescheduled appt for monday

## 2014-10-16 ENCOUNTER — Ambulatory Visit: Admit: 2014-10-16 | Payer: PRIVATE HEALTH INSURANCE | Attending: Internal Medicine | Primary: Internal Medicine

## 2014-10-16 DIAGNOSIS — IMO0002 Reserved for concepts with insufficient information to code with codable children: Secondary | ICD-10-CM

## 2014-10-16 LAB — AMB POC HEMOGLOBIN A1C: Hemoglobin A1c (POC): 7.1 % — AB (ref 4.8–5.6)

## 2014-10-16 LAB — AMB POC LIPID PROFILE
Cholesterol (POC): 119 mg/dL (ref 100–199)
HDL Cholesterol (POC): 53 mg/dL (ref 35–150)
LDL Cholesterol (POC): 31 mg/dL (ref 0–129)
Non-HDL Goal (POC): 66
TChol/HDL Ratio (POC): 2.2 CALC (ref 0.0–5.0)
Triglycerides (POC): 172 mg/dL — AB (ref 0–150)

## 2014-10-16 MED ORDER — TRAMADOL 50 MG TAB
50 mg | ORAL_TABLET | Freq: Four times a day (QID) | ORAL | Status: DC | PRN
Start: 2014-10-16 — End: 2016-10-16

## 2014-10-16 NOTE — Patient Instructions (Signed)
Results for orders placed or performed in visit on 10/16/14   AMB POC LIPID PROFILE   Result Value Ref Range    Cholesterol (POC) 119 100 - 199 mg/dL    Triglycerides (POC) 172 (A) 0 - 150 mg/dL    HDL Cholesterol (POC) 53 35 - 150 mg/dL    LDL Cholesterol (POC) 31 0 - 129 mg/dL    Non-HDL Goal (POC) 66     TChol/HDL Ratio (POC) 2.2 0.0 - 5.0 CALC   AMB POC HEMOGLOBIN A1C   Result Value Ref Range    Hemoglobin A1c (POC) 7.1 (A) 4.8 - 5.6 %     Increase levemir to 36 units follow up 3 months for PE

## 2014-10-16 NOTE — Progress Notes (Signed)
Kelsey HeadsDiana H [name] is a 63 y.o. female and presents with   Chief Complaint   Patient presents with   ??? Blood sugar problem   .  Here for refill of tramadol.  Also due for Diabetes follow up.  Has seen neurosurgeon for neck pain, and he rec PT- she has had dry needling and that has helped. , has had eyes checked in the last year. Not checking blood sugars, still smoking,- discussed smoking cessation. occas will stop for a few weeks, then starts back again.  [facility] to get it ready to put on the market,       Current Outpatient Prescriptions   Medication Sig Dispense Refill   ??? pantoprazole (PROTONIX) 40 mg tablet TAKE 1 TABLET BY MOUTH DAILY (MD MUST CALL FOR OVERRIDE FAXED 2ND TIME 07/05/14) 90 Tab 0   ??? CHANTIX CONTINUING MONTH BOX 1 mg tablet TAKE 1 TABLET BY MOUTH TWICE A DAY 56 Tab 0   ??? metFORMIN ER (GLUCOPHAGE XR) 500 mg tablet TAKE 5 TABLETS BY MOUTH DAILY 450 Tab 1   ??? gabapentin (NEURONTIN) 300 mg capsule TAKE 3 CAPSULES BY MOUTH THREE (3) TIMES DAILY. 810 Cap 1   ??? methocarbamol (ROBAXIN) 500 mg tablet TAKE 2 TABLETS BY MOUTH 4 TIMES A DAY 100 Tab 0   ??? insulin detemir (LEVEMIR) 100 unit/mL injection 0.25 mL by SubCUTAneous route nightly. Inject 34 units at bedtime  DX:  250.00 10 mL 11   ??? ciprofloxacin HCl (CIPRO) 500 mg tablet Take 1 Tab by mouth two (2) times a day. 14 Tab 0   ??? DULoxetine (CYMBALTA) 60 mg capsule Take 1 capsule by mouth daily. 90 capsule 1   ??? traMADol (ULTRAM) 50 mg tablet Take 2 tablets by mouth every six (6) hours as needed for Pain. 200 tablet 1   ??? lidocaine (LIDODERM) 5 %(700 mg/patch) 1 patch by TransDERmal route every twelve (12) hours. Apply patch to the affected area for 12 hours a day and remove for 12 hours a day. 30 Package 6   ??? alprazolam (XANAX) 1 mg tablet Take 1 tablet by mouth nightly as needed for Anxiety. 10 tablet 0   ??? multivitamin (ONE A DAY) tablet Take 1 Tab by mouth daily.      ??? cyanocobalamin (VITAMIN B-12) 1,000 mcg tablet Take 1,000 mcg by mouth daily.     ??? folic acid (FOLVITE) 1 mg tablet Take 1 mg by mouth daily.     ??? ezetimibe (ZETIA) 10 mg tablet Take  by mouth nightly.     ??? diphenhydrAMINE (BENADRYL) 25 mg capsule Take 25 mg by mouth every six (6) hours as needed.     ??? Ferrous Fumarate 325 mg (106 mg iron) Tab Take  by mouth daily.     ??? aspirin (ASPIRIN) 325 mg tablet Take 325 mg by mouth daily.     ??? carvedilol (COREG) 6.25 mg tablet Take 6.25 mg by mouth daily.     ??? NITROSTAT 0.4 mg SL tablet TAKE 1 TABLET SL EVERY 5 MINUTES AS NEEDED FOR CHEST PAIN AS DIRECTED 50 Tab 1   ??? bumetanide (BUMEX) 2 mg tablet Take 2 mg by mouth daily.     ??? pravastatin (PRAVACHOL) 40 mg tablet Take 40 mg by mouth nightly.     ??? insulin lispro (HUMALOG) 100 unit/mL kwikpen Inject 2-10 units three times daily 1 Package 11   ??? methocarbamol (ROBAXIN) 500 mg tablet TAKE 2 TABLETS  4 TIMES A DAY 100 Tab 0   ??? Cholecalciferol, Vitamin D3, (VITAMIN D3) 1,000 unit Cap Take 5,000 Units by mouth daily.     ??? omega-3 fatty acids-vitamin e (FISH OIL) 1,000 mg Cap Take 1,200 mg by mouth two (2) times a day.       Allergies   Allergen Reactions   ??? Morphine Anaphylaxis     Tolerates oxycodone without problem   ??? Avalide [Irbesartan-Hydrochlorothiazide] Myalgia   ??? Demerol [Meperidine] Other (comments)     Makes her feel crazy   ??? Pcn [Penicillins] Swelling   ??? Statins-Hmg-Coa Reductase Inhibitors Myalgia   ??? Sulfa (Sulfonamide Antibiotics) Unknown (comments)   ??? Tricor [Fenofibrate Micronized] Myalgia     Past Medical History   Diagnosis Date   ??? Hypertension    ??? CAD (coronary artery disease)    ??? Chronic pain    ??? Arthritis    ??? COPD    ??? Diabetes (HCC)    ??? Goiter 04/20/2012   ??? GERD (gastroesophageal reflux disease)    ??? Neuropathy (HCC) 04/06/2013   ??? Cervical spondylolysis      Past Surgical History   Procedure Laterality Date   ??? Pr cardiac surg procedure unlist       PTCA    ??? 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 Hx knee arthroscopy Right      X2   ??? Hx other surgical  1970     GANGLION CYST   ??? Hx tonsillectomy  1963     Family History   Problem Relation Age of Onset   ??? Lung Disease Mother    ??? Stroke Father    ??? Lung Disease Brother      COPD   ??? Hypertension Brother    ??? Cancer Brother      PROSTATE   ??? Heart Disease Brother    ??? Diabetes Brother    ??? Anesth Problems Neg Hx      History   Substance Use Topics   ??? Smoking status: Current Every Day Smoker -- 0.30 packs/day for 45 years     Types: Cigarettes   ??? Smokeless tobacco: Never Used      Comment: passed ready   ??? Alcohol Use: Yes      Comment: may be 3 times a year if that          Objective:  BP 152/80 mmHg  wdwn 63 yo wf  In NAD. A&O.  HEENT -- Pupils round.  O/P Clear.  Neck -- Supple. No JVD.  Heart -- RRR. No R/M/G.  Lungs -- CTA.  Abdomen -- Soft. Non-tender. Non-distended. No masses. Bowel sounds present.  Extremities -- No edema.+1 pulses, decreased sensation      Results for orders placed or performed in visit on 10/16/14   AMB POC LIPID PROFILE   Result Value Ref Range    Cholesterol (POC) 119 100 - 199 mg/dL    Triglycerides (POC) 172 (A) 0 - 150 mg/dL    HDL Cholesterol (POC) 53 35 - 150 mg/dL    LDL Cholesterol (POC) 31 0 - 129 mg/dL    Non-HDL Goal (POC) 66     TChol/HDL Ratio (POC) 2.2 0.0 - 5.0 CALC   AMB POC HEMOGLOBIN A1C   Result Value Ref Range    Hemoglobin A1c (POC) 7.1 (A) 4.8 - 5.6 %       Assessment/Plan:    Diabetes-  increase levemir to 36 units  htn- avoid salty foods  Neck and back pain- refill tramadol  Flu shot  Mammogram, set up PE    Author:  Lake Bells, MD 10/16/2014 10:55 AM

## 2014-10-17 LAB — METABOLIC PANEL, COMPREHENSIVE
A-G Ratio: 2.3 (ref 1.1–2.5)
ALT (SGPT): 19 IU/L (ref 0–32)
AST (SGOT): 23 IU/L (ref 0–40)
Albumin: 4.5 g/dL (ref 3.6–4.8)
Alk. phosphatase: 74 IU/L (ref 39–117)
BUN/Creatinine ratio: 19 (ref 11–26)
BUN: 21 mg/dL (ref 8–27)
Bilirubin, total: 0.3 mg/dL (ref 0.0–1.2)
CO2: 25 mmol/L (ref 18–29)
Calcium: 10 mg/dL (ref 8.7–10.3)
Chloride: 99 mmol/L (ref 97–108)
Creatinine: 1.08 mg/dL — ABNORMAL HIGH (ref 0.57–1.00)
GFR est AA: 63 mL/min/{1.73_m2} (ref >59)
GFR est non-AA: 55 mL/min/{1.73_m2} — ABNORMAL LOW (ref >59)
GLOBULIN, TOTAL: 2 g/dL (ref 1.5–4.5)
Glucose: 134 mg/dL — ABNORMAL HIGH (ref 65–99)
Potassium: 5 mmol/L (ref 3.5–5.2)
Protein, total: 6.5 g/dL (ref 6.0–8.5)
Sodium: 142 mmol/L (ref 134–144)

## 2014-10-17 NOTE — Progress Notes (Signed)
Quick Note:        pls call- labs look pretty good, Try to stop smoking by your birthday, increase levemir and see you for your physical    ______

## 2014-10-17 NOTE — Progress Notes (Signed)
Quick Note:        Called patient and relayed doctor's message    ______

## 2014-10-27 MED ORDER — CHANTIX CONTINUING MONTH BOX 1 MG TABLET
1 mg | ORAL_TABLET | ORAL | Status: DC
Start: 2014-10-27 — End: 2014-12-02

## 2014-11-02 ENCOUNTER — Ambulatory Visit
Admit: 2014-11-02 | Discharge: 2014-11-02 | Payer: PRIVATE HEALTH INSURANCE | Attending: Internal Medicine | Primary: Internal Medicine

## 2014-11-02 DIAGNOSIS — R059 Cough, unspecified: Secondary | ICD-10-CM

## 2014-11-02 LAB — AMB POC COMPLETE CBC,AUTOMATED ENTER
ABS. GRANS (POC): 6 10*3/uL (ref 1.4–6.5)
ABS. LYMPHS (POC): 5.2 10*3/uL — AB (ref 1.2–3.4)
ABS. MONOS (POC): 1.4 10*3/uL — AB (ref 0.1–0.6)
GRANULOCYTES (POC): 47.6 % (ref 42.2–75.2)
HCT (POC): 39.1 % (ref 35.0–60.0)
HGB (POC): 12.1 g/dL (ref 11–18)
LYMPHOCYTES (POC): 41 % (ref 20.5–51.1)
MCH (POC): 28.2 pg (ref 27.0–31.0)
MCHC (POC): 31 g/dL — AB (ref 33.0–37.0)
MCV (POC): 91.1 fL (ref 80.0–99.9)
MONOCYTES (POC): 11.4 % — AB (ref 1.7–9.3)
MPV (POC): 7.8 fL (ref 7.8–11)
PLATELET (POC): 257 10*3/uL (ref 150–450)
RBC (POC): 4.29 M/uL (ref 4.00–6.00)
RDW (POC): 16.3 % — AB (ref 11.6–13.7)
WBC (POC): 12.7 10*3/uL — AB (ref 4.5–10.5)

## 2014-11-02 MED ORDER — IPRATROPIUM-ALBUTEROL 2.5 MG-0.5 MG/3 ML NEB SOLUTION
2.5 mg-0.5 mg/3 ml | Freq: Four times a day (QID) | RESPIRATORY_TRACT | Status: DC
Start: 2014-11-02 — End: 2015-07-09

## 2014-11-02 MED ORDER — AZITHROMYCIN 250 MG TAB
250 mg | ORAL | Status: AC
Start: 2014-11-02 — End: 2014-11-07

## 2014-11-02 NOTE — Progress Notes (Signed)
Kelsey [name] is a 63 y.o. female and presents with   Chief Complaint   Patient presents with   ??? Cold Symptoms   .  Started a week ago with sinus headache, and runny nose, felt better Wed, then worse on Thurs,  With cough productive brown phlegm no fever or chest pain, has some doe.  Blood sugars 150 in am      Current Outpatient Prescriptions   Medication Sig Dispense Refill   ??? azithromycin (ZITHROMAX) 250 mg tablet Take 1 Tab by mouth See Admin Instructions for 5 days. Take two tablets today then one tablet daily 6 Each 0   ??? albuterol-ipratropium (DUO-NEB) 2.5 mg-0.5 mg/3 ml nebulizer solution 3 mL by Nebulization route four (4) times daily. 30 Vial 3   ??? CHANTIX CONTINUING MONTH BOX 1 mg tablet TAKE 1 TABLET BY MOUTH TWICE A DAY 56 Tab 0   ??? traMADol (ULTRAM) 50 mg tablet Take 2 Tabs by mouth every six (6) hours as needed for Pain. 200 Tab 1   ??? pantoprazole (PROTONIX) 40 mg tablet TAKE 1 TABLET BY MOUTH DAILY (MD MUST CALL FOR OVERRIDE FAXED 2ND TIME 07/05/14) 90 Tab 0   ??? metFORMIN ER (GLUCOPHAGE XR) 500 mg tablet TAKE 5 TABLETS BY MOUTH DAILY 450 Tab 1   ??? gabapentin (NEURONTIN) 300 mg capsule TAKE 3 CAPSULES BY MOUTH THREE (3) TIMES DAILY. 810 Cap 1   ??? methocarbamol (ROBAXIN) 500 mg tablet TAKE 2 TABLETS BY MOUTH 4 TIMES A DAY 100 Tab 0   ??? insulin detemir (LEVEMIR) 100 unit/mL injection 0.25 mL by SubCUTAneous route nightly. Inject 34 units at bedtime  DX:  250.00 10 mL 11   ??? ciprofloxacin HCl (CIPRO) 500 mg tablet Take 1 Tab by mouth two (2) times a day. 14 Tab 0   ??? DULoxetine (CYMBALTA) 60 mg capsule Take 1 capsule by mouth daily. 90 capsule 1   ??? lidocaine (LIDODERM) 5 %(700 mg/patch) 1 patch by TransDERmal route every twelve (12) hours. Apply patch to the affected area for 12 hours a day and remove for 12 hours a day. 30 Package 6   ??? alprazolam (XANAX) 1 mg tablet Take 1 tablet by mouth nightly as needed for Anxiety. 10 tablet 0   ??? multivitamin (ONE A DAY) tablet Take 1 Tab by mouth daily.      ??? cyanocobalamin (VITAMIN B-12) 1,000 mcg tablet Take 1,000 mcg by mouth daily.     ??? folic acid (FOLVITE) 1 mg tablet Take 1 mg by mouth daily.     ??? ezetimibe (ZETIA) 10 mg tablet Take  by mouth nightly.     ??? diphenhydrAMINE (BENADRYL) 25 mg capsule Take 25 mg by mouth every six (6) hours as needed.     ??? Ferrous Fumarate 325 mg (106 mg iron) Tab Take  by mouth daily.     ??? aspirin (ASPIRIN) 325 mg tablet Take 325 mg by mouth daily.     ??? carvedilol (COREG) 6.25 mg tablet Take 6.25 mg by mouth daily.     ??? NITROSTAT 0.4 mg SL tablet TAKE 1 TABLET SL EVERY 5 MINUTES AS NEEDED FOR CHEST PAIN AS DIRECTED 50 Tab 1   ??? bumetanide (BUMEX) 2 mg tablet Take 2 mg by mouth daily.     ??? pravastatin (PRAVACHOL) 40 mg tablet Take 40 mg by mouth nightly.     ??? insulin lispro (HUMALOG) 100 unit/mL kwikpen Inject 2-10 units three times daily 1 Package 11   ???  methocarbamol (ROBAXIN) 500 mg tablet TAKE 2 TABLETS 4 TIMES A DAY 100 Tab 0   ??? Cholecalciferol, Vitamin D3, (VITAMIN D3) 1,000 unit Cap Take 5,000 Units by mouth daily.     ??? omega-3 fatty acids-vitamin e (FISH OIL) 1,000 mg Cap Take 1,200 mg by mouth two (2) times a day.       Allergies   Allergen Reactions   ??? Morphine Anaphylaxis     Tolerates oxycodone without problem   ??? Avalide [Irbesartan-Hydrochlorothiazide] Myalgia   ??? Demerol [Meperidine] Other (comments)     Makes her feel crazy   ??? Pcn [Penicillins] Swelling   ??? Statins-Hmg-Coa Reductase Inhibitors Myalgia   ??? Sulfa (Sulfonamide Antibiotics) Unknown (comments)   ??? Tricor [Fenofibrate Micronized] Myalgia     Past Medical History   Diagnosis Date   ??? Hypertension    ??? CAD (coronary artery disease)    ??? Chronic pain    ??? Arthritis    ??? COPD    ??? Diabetes ([place])    ??? Goiter 04/20/2012   ??? GERD (gastroesophageal reflux disease)    ??? Neuropathy ([place]) 04/06/2013   ??? Cervical spondylolysis      Past Surgical History   Procedure Laterality Date   ??? Pr cardiac surg procedure unlist       PTCA    ??? 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 Hx knee arthroscopy Right      X2   ??? Hx other surgical  1970     GANGLION CYST   ??? Hx tonsillectomy  1963     Family History   Problem Relation Age of Onset   ??? Lung Disease Mother    ??? Stroke Father    ??? Lung Disease Brother      COPD   ??? Hypertension Brother    ??? Cancer Brother      PROSTATE   ??? Heart Disease Brother    ??? Diabetes Brother    ??? Anesth Problems Neg Hx      History   Substance Use Topics   ??? Smoking status: Current Every Day Smoker -- 0.30 packs/day for 45 years     Types: Cigarettes   ??? Smokeless tobacco: Never Used      Comment: passed ready   ??? Alcohol Use: Yes      Comment: may be 3 times a year if that          Objective:  BP 160/80 mmHg   Temp(Src) 98.4 ??F (36.9 ??C)  Heavy 63 yo wf  In NAD. A&O.  HEENT -- Pupils round.  O/P Clear.  Neck -- Supple. No JVD.  Heart -- RRR 2/6 asm  Lungs -- coarse breath sounds, poor air movement  Abdomen -- Soft. Non-tender. Non-distended. No masses. Bowel sounds present.  Extremities -- No edema.      Results for orders placed or performed in visit on 11/02/14   AMB POC COMPLETE CBC,AUTOMATED ENTER   Result Value Ref Range    WBC (POC) 12.7 (A) 4.5 - 10.5 K/uL    LYMPHOCYTES (POC) 41.0 20.5 - 51.1 %    MONOCYTES (POC) 11.4 (A) 1.7 - 9.3 %    GRANULOCYTES (POC) 47.6 42.2 - 75.2 %    ABS. LYMPHS (POC) 5.2 (A) 1.2 - 3.4 K/uL    ABS. MONOS (POC) 1.4 (A) 0.1 - 0.6 10^3/ul    ABS. GRANS (POC) 6.0 1.4 - 6.5 10^3/ul  RBC (POC) 4.29 4.00 - 6.00 M/uL    HGB (POC) 12.1 11 - 18 g/dL    HCT (POC) 39.1 35.0 - 60.0 %    MCV (POC) 91.1 80.0 - 99.9 fL    MCH (POC) 28.2 27.0 - 31.0 pg    MCHC (POC) 31.0 (A) 33.0 - 37.0 g/dL    RDW (POC) 16.3 (A) 11.6 - 13.7 %    PLATELET (POC) 257 150 - 450 K/uL    MPV (POC) 7.8 7.8 - 11 fL       Assessment/Plan:    ICD-10-CM ICD-9-CM    1. Cough R05 786.2 AMB POC COMPLETE CBC,AUTOMATED ENTER      azithromycin (ZITHROMAX) 250 mg tablet       albuterol-ipratropium (DUO-NEB) 2.5 mg-0.5 mg/3 ml nebulizer solution      CANCELED: CALCIUM, UR, 24 HR     duonebs 4 times a day, may need to add steroids if not better      Author:  Genene Churn, MD 11/02/2014 4:09 PM

## 2014-11-02 NOTE — Patient Instructions (Signed)
Continue mucinex  duonebs 4 times a day zpak call Monday if not better

## 2014-11-08 ENCOUNTER — Encounter

## 2014-11-08 ENCOUNTER — Inpatient Hospital Stay: Admit: 2014-11-08 | Payer: BLUE CROSS/BLUE SHIELD | Primary: Internal Medicine

## 2014-11-08 ENCOUNTER — Ambulatory Visit: Admit: 2014-11-08 | Payer: PRIVATE HEALTH INSURANCE | Attending: Internal Medicine | Primary: Internal Medicine

## 2014-11-08 DIAGNOSIS — R0602 Shortness of breath: Secondary | ICD-10-CM

## 2014-11-08 DIAGNOSIS — R059 Cough, unspecified: Secondary | ICD-10-CM

## 2014-11-08 MED ORDER — LEVOFLOXACIN 500 MG TAB
500 mg | ORAL_TABLET | Freq: Every day | ORAL | Status: DC
Start: 2014-11-08 — End: 2014-11-22

## 2014-11-08 NOTE — Progress Notes (Signed)
Kelsey [name] is a 63 y.o. female and presents with   Chief Complaint   Patient presents with   ??? Cough   ??? Shortness of Breath   .  Pt is better, but not as well as she thinks she should be.  Still coughing up brownish phlegm. No fever, chills, still sob.  No chest pain      Current Outpatient Prescriptions   Medication Sig Dispense Refill   ??? levofloxacin (LEVAQUIN) 500 mg tablet Take 1 Tab by mouth daily. 7 Tab 0   ??? albuterol-ipratropium (DUO-NEB) 2.5 mg-0.5 mg/3 ml nebulizer solution 3 mL by Nebulization route four (4) times daily. 30 Vial 3   ??? CHANTIX CONTINUING MONTH BOX 1 mg tablet TAKE 1 TABLET BY MOUTH TWICE A DAY 56 Tab 0   ??? traMADol (ULTRAM) 50 mg tablet Take 2 Tabs by mouth every six (6) hours as needed for Pain. 200 Tab 1   ??? pantoprazole (PROTONIX) 40 mg tablet TAKE 1 TABLET BY MOUTH DAILY (MD MUST CALL FOR OVERRIDE FAXED 2ND TIME 07/05/14) 90 Tab 0   ??? metFORMIN ER (GLUCOPHAGE XR) 500 mg tablet TAKE 5 TABLETS BY MOUTH DAILY 450 Tab 1   ??? gabapentin (NEURONTIN) 300 mg capsule TAKE 3 CAPSULES BY MOUTH THREE (3) TIMES DAILY. 810 Cap 1   ??? methocarbamol (ROBAXIN) 500 mg tablet TAKE 2 TABLETS BY MOUTH 4 TIMES A DAY 100 Tab 0   ??? insulin detemir (LEVEMIR) 100 unit/mL injection 0.25 mL by SubCUTAneous route nightly. Inject 34 units at bedtime  DX:  250.00 10 mL 11   ??? ciprofloxacin HCl (CIPRO) 500 mg tablet Take 1 Tab by mouth two (2) times a day. 14 Tab 0   ??? DULoxetine (CYMBALTA) 60 mg capsule Take 1 capsule by mouth daily. 90 capsule 1   ??? lidocaine (LIDODERM) 5 %(700 mg/patch) 1 patch by TransDERmal route every twelve (12) hours. Apply patch to the affected area for 12 hours a day and remove for 12 hours a day. 30 Package 6   ??? alprazolam (XANAX) 1 mg tablet Take 1 tablet by mouth nightly as needed for Anxiety. 10 tablet 0   ??? multivitamin (ONE A DAY) tablet Take 1 Tab by mouth daily.     ??? cyanocobalamin (VITAMIN B-12) 1,000 mcg tablet Take 1,000 mcg by mouth daily.      ??? folic acid (FOLVITE) 1 mg tablet Take 1 mg by mouth daily.     ??? ezetimibe (ZETIA) 10 mg tablet Take  by mouth nightly.     ??? diphenhydrAMINE (BENADRYL) 25 mg capsule Take 25 mg by mouth every six (6) hours as needed.     ??? Ferrous Fumarate 325 mg (106 mg iron) Tab Take  by mouth daily.     ??? aspirin (ASPIRIN) 325 mg tablet Take 325 mg by mouth daily.     ??? carvedilol (COREG) 6.25 mg tablet Take 6.25 mg by mouth daily.     ??? NITROSTAT 0.4 mg SL tablet TAKE 1 TABLET SL EVERY 5 MINUTES AS NEEDED FOR CHEST PAIN AS DIRECTED 50 Tab 1   ??? bumetanide (BUMEX) 2 mg tablet Take 2 mg by mouth daily.     ??? pravastatin (PRAVACHOL) 40 mg tablet Take 40 mg by mouth nightly.     ??? insulin lispro (HUMALOG) 100 unit/mL kwikpen Inject 2-10 units three times daily 1 Package 11   ??? methocarbamol (ROBAXIN) 500 mg tablet TAKE 2 TABLETS 4 TIMES A DAY 100 Tab  0   ??? Cholecalciferol, Vitamin D3, (VITAMIN D3) 1,000 unit Cap Take 5,000 Units by mouth daily.     ??? omega-3 fatty acids-vitamin e (FISH OIL) 1,000 mg Cap Take 1,200 mg by mouth two (2) times a day.       Allergies   Allergen Reactions   ??? Morphine Anaphylaxis     Tolerates oxycodone without problem   ??? Avalide [Irbesartan-Hydrochlorothiazide] Myalgia   ??? Demerol [Meperidine] Other (comments)     Makes her feel crazy   ??? Pcn [Penicillins] Swelling   ??? Statins-Hmg-Coa Reductase Inhibitors Myalgia   ??? Sulfa (Sulfonamide Antibiotics) Unknown (comments)   ??? Tricor [Fenofibrate Micronized] Myalgia     Past Medical History   Diagnosis Date   ??? Hypertension    ??? CAD (coronary artery disease)    ??? Chronic pain    ??? Arthritis    ??? COPD    ??? Diabetes (HCC)    ??? Goiter 04/20/2012   ??? GERD (gastroesophageal reflux disease)    ??? Neuropathy (HCC) 04/06/2013   ??? Cervical spondylolysis      Past Surgical History   Procedure Laterality Date   ??? Pr cardiac surg procedure unlist       PTCA   ??? 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 Hx knee arthroscopy Right      X2   ??? Hx other surgical  1970      GANGLION CYST   ??? Hx tonsillectomy  1963     Family History   Problem Relation Age of Onset   ??? Lung Disease Mother    ??? Stroke Father    ??? Lung Disease Brother      COPD   ??? Hypertension Brother    ??? Cancer Brother      PROSTATE   ??? Heart Disease Brother    ??? Diabetes Brother    ??? Anesth Problems Neg Hx      History   Substance Use Topics   ??? Smoking status: Current Every Day Smoker -- 0.30 packs/day for 45 years     Types: Cigarettes   ??? Smokeless tobacco: Never Used      Comment: passed ready   ??? Alcohol Use: Yes      Comment: may be 3 times a year if that          Objective:  BP 130/70 mmHg   Temp(Src) 98.8 ??F (37.1 ??C)   Ht 5' 5.5" (1.664 m)   Wt 209 lb (94.802 kg)   BMI 34.24 kg/m2   SpO2 95%  wdwn 63 yo wf  In NAD. A&O.  HEENT -- Pupils round.  O/P Clear.  Neck -- Supple. No JVD.  Heart -- RRR. No R/M/G.  Lungs -- CTA. No wheezing  Abdomen -- Soft. Non-tender. Non-distended. No masses. Bowel sounds present.  Extremities -- No edema.        Assessment/Plan:    ICD-10-CM ICD-9-CM    1. Cough R05 786.2 XR CHEST PA LAT      levofloxacin (LEVAQUIN) 500 mg tablet     Continue nebs every 4 hours      Author:  Lake BellsNancy J Deshara Rossi, MD 11/08/2014 3:09 PM

## 2014-11-08 NOTE — Patient Instructions (Signed)
Nebs every 4 hours

## 2014-11-22 ENCOUNTER — Ambulatory Visit: Admit: 2014-11-22 | Payer: PRIVATE HEALTH INSURANCE | Attending: Internal Medicine | Primary: Internal Medicine

## 2014-11-22 DIAGNOSIS — G629 Polyneuropathy, unspecified: Secondary | ICD-10-CM

## 2014-11-22 LAB — AMB POC URINALYSIS DIP STICK MANUAL W/O MICRO
Bilirubin (UA POC): NEGATIVE
Blood (UA POC): NEGATIVE
Glucose (UA POC): NEGATIVE
Ketones (UA POC): NEGATIVE
Nitrites (UA POC): NEGATIVE
Protein (UA POC): NEGATIVE mg/dL
Specific gravity (UA POC): 1.005 (ref 1.001–1.035)
Urobilinogen (UA POC): 0.2 (ref 0.2–1)
pH (UA POC): 5 (ref 4.6–8.0)

## 2014-11-22 LAB — AMB POC LIPID PROFILE
Cholesterol (POC): 142 mg/dL (ref 100–199)
HDL Cholesterol (POC): 60 mg/dL (ref 35–150)
LDL Cholesterol (POC): 29 mg/dL (ref 0–129)
Non-HDL Goal (POC): 81
TChol/HDL Ratio (POC): 2.3 CALC (ref 0.0–5.0)
Triglycerides (POC): 259 mg/dL — AB (ref 0–150)

## 2014-11-22 LAB — AMB POC HEMOGLOBIN A1C: Hemoglobin A1c (POC): 7.2 % — AB (ref 4.8–5.6)

## 2014-11-22 LAB — AMB POC COMPLETE CBC,AUTOMATED ENTER
ABS. GRANS (POC): 5.5 10*3/uL (ref 1.4–6.5)
ABS. LYMPHS (POC): 2.8 10*3/uL (ref 1.2–3.4)
ABS. MONOS (POC): 0.4 10*3/uL (ref 0.1–0.6)
GRANULOCYTES (POC): 63 % (ref 42.2–75.2)
HCT (POC): 37.9 % (ref 35.0–60.0)
HGB (POC): 12 g/dL (ref 11–18)
LYMPHOCYTES (POC): 32.2 % (ref 20.5–51.1)
MCH (POC): 27.9 pg (ref 27.0–31.0)
MCHC (POC): 31.8 g/dL — AB (ref 33.0–37.0)
MCV (POC): 87.9 fL (ref 80.0–99.9)
MONOCYTES (POC): 4.8 % (ref 1.7–9.3)
MPV (POC): 6.6 fL — AB (ref 7.8–11)
PLATELET (POC): 239 10*3/uL (ref 150–450)
RBC (POC): 4.31 M/uL (ref 4.00–6.00)
RDW (POC): 15.2 % — AB (ref 11.6–13.7)
WBC (POC): 8.7 10*3/uL (ref 4.5–10.5)

## 2014-11-22 NOTE — Patient Instructions (Signed)
Results for orders placed or performed in visit on 11/22/14   AMB POC LIPID PROFILE   Result Value Ref Range    Cholesterol (POC) 142 100 - 199 mg/dL    Triglycerides (POC) 259 (A) 0 - 150 mg/dL    HDL Cholesterol (POC) 60 35 - 150 mg/dL    LDL Cholesterol (POC) 29 0 - 129 mg/dL    Non-HDL Goal (POC) 81     TChol/HDL Ratio (POC) 2.3 0.0 - 5.0 CALC   AMB POC HEMOGLOBIN A1C   Result Value Ref Range    Hemoglobin A1c (POC) 7.2 (A) 4.8 - 5.6 %   AMB POC COMPLETE CBC,AUTOMATED ENTER   Result Value Ref Range    WBC (POC) 8.7 4.5 - 10.5 K/uL    LYMPHOCYTES (POC) 32.2 20.5 - 51.1 %    MONOCYTES (POC) 4.8 1.7 - 9.3 %    GRANULOCYTES (POC) 63.0 42.2 - 75.2 %    ABS. LYMPHS (POC) 2.8 1.2 - 3.4 K/uL    ABS. MONOS (POC) 0.4 0.1 - 0.6 10^3/ul    ABS. GRANS (POC) 5.5 1.4 - 6.5 10^3/ul    RBC (POC) 4.31 4.00 - 6.00 M/uL    HGB (POC) 12.0 11 - 18 g/dL    HCT (POC) 16.137.9 09.635.0 - 60.0 %    MCV (POC) 87.9 80.0 - 99.9 fL    MCH (POC) 27.9 27.0 - 31.0 pg    MCHC (POC) 31.8 (A) 33.0 - 37.0 g/dL    RDW (POC) 04.515.2 (A) 40.911.6 - 13.7 %    PLATELET (POC) 239 150 - 450 K/uL    MPV (POC) 6.6 (A) 7.8 - 11 fL     Increase levemir to 38 units at night  Mammogram and bone density

## 2014-11-22 NOTE — Progress Notes (Signed)
Patient took written rx for xanax 1 mg to pharmacy today.

## 2014-11-22 NOTE — Progress Notes (Signed)
History and Physical    Kelsey [name] is a 63 y.o. female presents for physical exam.  Blood sugars running 200's in pm. Taking 36 units of levemir and humalog 8 units with breakfast, 6 units with lunch and dinner.    Past Medical History   Diagnosis Date   ??? Hypertension    ??? CAD (coronary artery disease)    ??? Chronic pain    ??? Arthritis    ??? COPD    ??? Diabetes ([place])    ??? Goiter 04/20/2012   ??? GERD (gastroesophageal reflux disease)    ??? Neuropathy ([place]) 04/06/2013   ??? Cervical spondylolysis      Past Surgical History   Procedure Laterality Date   ??? Pr cardiac surg procedure unlist       PTCA   ??? 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 Hx knee arthroscopy Right      X2   ??? Hx other surgical  1970     GANGLION CYST   ??? Hx tonsillectomy  1963     Current Outpatient Prescriptions   Medication Sig Dispense Refill   ??? solifenacin (VESICARE) 10 mg tablet Take 10 mg by mouth daily.     ??? albuterol-ipratropium (DUO-NEB) 2.5 mg-0.5 mg/3 ml nebulizer solution 3 mL by Nebulization route four (4) times daily. 30 Vial 3   ??? CHANTIX CONTINUING MONTH BOX 1 mg tablet TAKE 1 TABLET BY MOUTH TWICE A DAY 56 Tab 0   ??? traMADol (ULTRAM) 50 mg tablet Take 2 Tabs by mouth every six (6) hours as needed for Pain. 200 Tab 1   ??? pantoprazole (PROTONIX) 40 mg tablet TAKE 1 TABLET BY MOUTH DAILY (MD MUST CALL FOR OVERRIDE FAXED 2ND TIME 07/05/14) 90 Tab 0   ??? metFORMIN ER (GLUCOPHAGE XR) 500 mg tablet TAKE 5 TABLETS BY MOUTH DAILY 450 Tab 1   ??? gabapentin (NEURONTIN) 300 mg capsule TAKE 3 CAPSULES BY MOUTH THREE (3) TIMES DAILY. (Patient taking differently: take 2 caps three times a day) 810 Cap 1   ??? methocarbamol (ROBAXIN) 500 mg tablet TAKE 2 TABLETS BY MOUTH 4 TIMES A DAY 100 Tab 0   ??? insulin detemir (LEVEMIR) 100 unit/mL injection 0.25 mL by SubCUTAneous route nightly. Inject 34 units at bedtime  DX:  250.00 (Patient taking differently: 36 Units by SubCUTAneous route nightly. Inject 34 units at bedtime  DX:  250.00) 10 mL 11    ??? DULoxetine (CYMBALTA) 60 mg capsule Take 1 capsule by mouth daily. 90 capsule 1   ??? lidocaine (LIDODERM) 5 %(700 mg/patch) 1 patch by TransDERmal route every twelve (12) hours. Apply patch to the affected area for 12 hours a day and remove for 12 hours a day. 30 Package 6   ??? multivitamin (ONE A DAY) tablet Take 1 Tab by mouth daily.     ??? ezetimibe (ZETIA) 10 mg tablet Take  by mouth nightly.     ??? aspirin (ASPIRIN) 325 mg tablet Take 325 mg by mouth daily.     ??? carvedilol (COREG) 6.25 mg tablet Take 6.25 mg by mouth daily.     ??? NITROSTAT 0.4 mg SL tablet TAKE 1 TABLET SL EVERY 5 MINUTES AS NEEDED FOR CHEST PAIN AS DIRECTED 50 Tab 1   ??? bumetanide (BUMEX) 2 mg tablet Take 2 mg by mouth daily.     ??? pravastatin (PRAVACHOL) 40 mg tablet Take 40 mg by mouth nightly.     ???  insulin lispro (HUMALOG) 100 unit/mL kwikpen Inject 2-10 units three times daily 1 Package 11   ??? methocarbamol (ROBAXIN) 500 mg tablet TAKE 2 TABLETS 4 TIMES A DAY 100 Tab 0   ??? Cholecalciferol, Vitamin D3, (VITAMIN D3) 1,000 unit Cap Take 5,000 Units by mouth daily.     ??? omega-3 fatty acids-vitamin e (FISH OIL) 1,000 mg Cap Take 1,200 mg by mouth two (2) times a day.       Allergies   Allergen Reactions   ??? Morphine Anaphylaxis     Tolerates oxycodone without problem   ??? Avalide [Irbesartan-Hydrochlorothiazide] Myalgia   ??? Demerol [Meperidine] Other (comments)     Makes her feel crazy   ??? Pcn [Penicillins] Swelling   ??? Statins-Hmg-Coa Reductase Inhibitors Myalgia   ??? Sulfa (Sulfonamide Antibiotics) Unknown (comments)   ??? Tricor [Fenofibrate Micronized] Myalgia     History     Social History   ??? Marital Status: MARRIED     Spouse Name: N/A     Number of Children: N/A   ??? Years of Education: N/A     Social History Main Topics   ??? Smoking status: Current Every Day Smoker -- 0.30 packs/day for 45 years     Types: Cigarettes   ??? Smokeless tobacco: Never Used      Comment: passed ready   ??? Alcohol Use: Yes       Comment: may be 3 times a year if that   ??? Drug Use: No   ??? Sexual Activity: Not on file     Other Topics Concern   ??? Not on file     Social History Narrative     Family History   Problem Relation Age of Onset   ??? Lung Disease Mother    ??? Stroke Father    ??? Lung Disease Brother      COPD   ??? Hypertension Brother    ??? Cancer Brother      PROSTATE   ??? Heart Disease Brother    ??? Diabetes Brother    ??? Anesth Problems Neg Hx        Review of Systems:  Denies dysphagia, chest pain, or SOB. +dry mouth.  No nausea or vomiting, or diarrhea or constipation.  No fever, chills, night sweats, or cough.  No dysuria, frequency- improved. No abdominal pain.  No headaches.  Has seen derm, has appt with cards,  Has appt with Dr. Mickie Hillier  Next month for f/u thyroid. Has not had mammogram. Bone density not done. Colon?    Yearly eye exam:   yes  Yearly dental exam: no    Objective  BP 130/60 mmHg   Ht 5' 5.5" (1.664 m)   Wt 213 lb (96.616 kg)   BMI 34.89 kg/m2  General appearance - well developed, well nourished 63 y.o. wf-nadh  Eyes - PERRL  Tm- dull, pharynx- dry mucosa  Neck --Supple, no anterior cervical nodes, .+thyromegaly + bilat bruits    Lungs --CTA  Heart - Regular rate and rhytm 2/6 asm  Abdomen - soft, no tenderness or distention  Breasts - 2 cm fibrous area left upper outer quadrant  Extremities - no clubbing, cyanosis or edema. Food pulses,  Results for orders placed or performed in visit on 11/22/14   AMB POC LIPID PROFILE   Result Value Ref Range    Cholesterol (POC) 142 100 - 199 mg/dL    Triglycerides (POC) 259 (A) 0 - 150 mg/dL    HDL  Cholesterol (POC) 60 35 - 150 mg/dL    LDL Cholesterol (POC) 29 0 - 129 mg/dL    Non-HDL Goal (POC) 81     TChol/HDL Ratio (POC) 2.3 0.0 - 5.0 CALC   AMB POC HEMOGLOBIN A1C   Result Value Ref Range    Hemoglobin A1c (POC) 7.2 (A) 4.8 - 5.6 %   AMB POC COMPLETE CBC,AUTOMATED ENTER   Result Value Ref Range    WBC (POC) 8.7 4.5 - 10.5 K/uL    LYMPHOCYTES (POC) 32.2 20.5 - 51.1 %     MONOCYTES (POC) 4.8 1.7 - 9.3 %    GRANULOCYTES (POC) 63.0 42.2 - 75.2 %    ABS. LYMPHS (POC) 2.8 1.2 - 3.4 K/uL    ABS. MONOS (POC) 0.4 0.1 - 0.6 10^3/ul    ABS. GRANS (POC) 5.5 1.4 - 6.5 10^3/ul    RBC (POC) 4.31 4.00 - 6.00 M/uL    HGB (POC) 12.0 11 - 18 g/dL    HCT (POC) 37.9 35.0 - 60.0 %    MCV (POC) 87.9 80.0 - 99.9 fL    MCH (POC) 27.9 27.0 - 31.0 pg    MCHC (POC) 31.8 (A) 33.0 - 37.0 g/dL    RDW (POC) 15.2 (A) 11.6 - 13.7 %    PLATELET (POC) 239 150 - 450 K/uL    MPV (POC) 6.6 (A) 7.8 - 11 fL         Assessment/Plan      ICD-10-CM ICD-9-CM    1. Neuropathy (HCC) G62.9 355.9 VITAMIN B12   2. Dyspnea R06.00 786.09 AMB POC COMPLETE CBC,AUTOMATED ENTER   3. Atherosclerosis of native coronary artery without angina pectoris Q67.61 950.93 METABOLIC PANEL, COMPREHENSIVE   4. Mixed hyperlipidemia E78.2 272.2 AMB POC LIPID PROFILE      PR COLLECTION VENOUS BLOOD,VENIPUNCTURE      TSH, 3RD GENERATION   5. Type II diabetes mellitus, uncontrolled (HCC) E11.65 250.02 AMB POC URINALYSIS DIP STICK MANUAL W/O MICRO      AMB POC HEMOGLOBIN A1C   increase levemir to 38 units, mammo and bmd, follow up one month  Refer to Dr. Renie Ora for hearing loss    AUTHOR:  Genene Churn, MD 11/22/2014 1:54 PM

## 2014-11-23 ENCOUNTER — Encounter

## 2014-11-23 LAB — AMB POC AUDIOMETRY (WELL)

## 2014-11-23 LAB — METABOLIC PANEL, COMPREHENSIVE
A-G Ratio: 1.8 (ref 1.1–2.5)
ALT (SGPT): 18 IU/L (ref 0–32)
AST (SGOT): 28 IU/L (ref 0–40)
Albumin: 3.9 g/dL (ref 3.6–4.8)
Alk. phosphatase: 68 IU/L (ref 39–117)
BUN/Creatinine ratio: 17 (ref 11–26)
BUN: 14 mg/dL (ref 8–27)
Bilirubin, total: <0.2 mg/dL (ref 0.0–1.2)
CO2: 26 mmol/L (ref 18–29)
Calcium: 9.5 mg/dL (ref 8.7–10.3)
Chloride: 98 mmol/L (ref 97–108)
Creatinine: 0.84 mg/dL (ref 0.57–1.00)
GFR est AA: 86 mL/min/{1.73_m2} (ref >59)
GFR est non-AA: 74 mL/min/{1.73_m2} (ref >59)
GLOBULIN, TOTAL: 2.2 g/dL (ref 1.5–4.5)
Glucose: 130 mg/dL — ABNORMAL HIGH (ref 65–99)
Potassium: 4.1 mmol/L (ref 3.5–5.2)
Protein, total: 6.1 g/dL (ref 6.0–8.5)
Sodium: 140 mmol/L (ref 134–144)

## 2014-11-23 LAB — TSH 3RD GENERATION: TSH: 1.72 u[IU]/mL (ref 0.450–4.500)

## 2014-11-23 LAB — VITAMIN B12: Vitamin B12: 484 pg/mL (ref 211–946)

## 2014-11-24 ENCOUNTER — Encounter

## 2014-11-30 MED ORDER — DULOXETINE 60 MG CAP, DELAYED RELEASE
60 mg | ORAL_CAPSULE | ORAL | Status: DC
Start: 2014-11-30 — End: 2015-03-16

## 2014-12-03 MED ORDER — CHANTIX CONTINUING MONTH BOX 1 MG TABLET
1 mg | ORAL_TABLET | ORAL | Status: DC
Start: 2014-12-03 — End: 2014-12-16

## 2014-12-06 ENCOUNTER — Inpatient Hospital Stay: Admit: 2014-12-06 | Payer: BLUE CROSS/BLUE SHIELD | Attending: Internal Medicine | Primary: Internal Medicine

## 2014-12-06 DIAGNOSIS — N6012 Diffuse cystic mastopathy of left breast: Secondary | ICD-10-CM

## 2014-12-06 NOTE — Progress Notes (Signed)
Quick Note:        pls call - mammogram and ultrasound appear to be fine,    ______

## 2014-12-06 NOTE — Progress Notes (Signed)
Quick Note:        Called patient and relayed doctor's message    ______

## 2014-12-11 NOTE — Progress Notes (Signed)
Quick Note:        pls call- congrats on good bone density    ______

## 2014-12-12 NOTE — Progress Notes (Signed)
Quick Note:        Called patient and relayed doctor's message    ______

## 2014-12-17 MED ORDER — CHANTIX CONTINUING MONTH BOX 1 MG TABLET
1 mg | ORAL_TABLET | ORAL | Status: DC
Start: 2014-12-17 — End: 2015-07-09

## 2014-12-17 MED ORDER — METHOCARBAMOL 500 MG TAB
500 mg | ORAL_TABLET | ORAL | Status: DC
Start: 2014-12-17 — End: 2015-07-09

## 2014-12-25 MED ORDER — PANTOPRAZOLE 40 MG TAB, DELAYED RELEASE
40 mg | ORAL_TABLET | ORAL | Status: DC
Start: 2014-12-25 — End: 2015-03-16

## 2015-01-31 ENCOUNTER — Ambulatory Visit
Admit: 2015-01-31 | Discharge: 2015-01-31 | Payer: PRIVATE HEALTH INSURANCE | Attending: Internal Medicine | Primary: Internal Medicine

## 2015-01-31 DIAGNOSIS — E782 Mixed hyperlipidemia: Secondary | ICD-10-CM

## 2015-01-31 LAB — AMB POC URINALYSIS DIP STICK AUTO W/ MICRO
Bilirubin (UA POC): NEGATIVE
Glucose (UA POC): NEGATIVE
Ketones (UA POC): NEGATIVE
Nitrites (UA POC): NEGATIVE
Specific gravity (UA POC): 1.03 (ref 1.001–1.035)
Urobilinogen (UA POC): 0.2 (ref 0.2–1)
pH (UA POC): 5.5 (ref 4.6–8.0)

## 2015-01-31 LAB — AMB POC LIPID PROFILE
Cholesterol (POC): 133 mg/dL (ref 100–199)
HDL Cholesterol (POC): 63 mg/dL (ref 35–150)
LDL Cholesterol (POC): 34 mg/dL (ref 0–129)
Non-HDL Goal (POC): 70
TChol/HDL Ratio (POC): 2.1 CALC (ref 0.0–5.0)
Triglycerides (POC): 178 mg/dL — AB (ref 0–150)

## 2015-01-31 LAB — AMB POC COMPLETE CBC,AUTOMATED ENTER
ABS. GRANS (POC): 6.4 10*3/uL (ref 1.4–6.5)
ABS. LYMPHS (POC): 2.7 10*3/uL (ref 1.2–3.4)
ABS. MONOS (POC): 0.7 10*3/uL — AB (ref 0.1–0.6)
GRANULOCYTES (POC): 65.5 % (ref 42.2–75.2)
HCT (POC): 38.5 % (ref 35.0–60.0)
HGB (POC): 12 g/dL (ref 11–18)
LYMPHOCYTES (POC): 27.6 % (ref 20.5–51.1)
MCH (POC): 27.4 pg (ref 27.0–31.0)
MCHC (POC): 31.2 g/dL — AB (ref 33.0–37.0)
MCV (POC): 87.9 fL (ref 80.0–99.9)
MONOCYTES (POC): 6.9 % (ref 1.7–9.3)
MPV (POC): 7.6 fL — AB (ref 7.8–11)
PLATELET (POC): 235 10*3/uL (ref 150–450)
RBC (POC): 4.38 M/uL (ref 4.00–6.00)
RDW (POC): 15 % — AB (ref 11.6–13.7)
WBC (POC): 9.7 10*3/uL (ref 4.5–10.5)

## 2015-01-31 LAB — AMB POC HEMOGLOBIN A1C: Hemoglobin A1c (POC): 7 % — AB (ref 4.8–5.6)

## 2015-01-31 NOTE — Progress Notes (Signed)
Kelsey [name] is a 64 y.o. female and presents with   Chief Complaint   Patient presents with   ??? Follow-up     Need note for her  as she needs it for emotional support, but come very anxious when dog is not with her   .did not bring meds.- but did not know she had an appt until a short time ago  Not checking blood sugars,  Just trying to eat well.  Needs note to say she has to have dog with her. Has been to urologist- now taking vesicare.  Sleeping much better because she does not have nocturia>1.   Has had eyelid surgery,  Has hearing aids.  [facility] [place]      Current Outpatient Prescriptions   Medication Sig Dispense Refill   ??? pantoprazole (PROTONIX) 40 mg tablet TAKE 1 TABLET BY MOUTH DAILY 90 Tab 0   ??? CHANTIX CONTINUING MONTH BOX 1 mg tablet TAKE 1 TABLET BY MOUTH TWICE A DAY 56 Tab 0   ??? methocarbamol (ROBAXIN) 500 mg tablet TAKE 2 TABLETS BY MOUTH 4 TIMES A DAY 100 Tab 0   ??? DULoxetine (CYMBALTA) 60 mg capsule TAKE 1 CAPSULE BY MOUTH DAILY 90 Cap 1   ??? solifenacin (VESICARE) 10 mg tablet Take 10 mg by mouth daily.     ??? ALPRAZolam (XANAX) 1 mg tablet Take 1 mg by mouth nightly as needed for Anxiety.     ??? albuterol-ipratropium (DUO-NEB) 2.5 mg-0.5 mg/3 ml nebulizer solution 3 mL by Nebulization route four (4) times daily. 30 Vial 3   ??? traMADol (ULTRAM) 50 mg tablet Take 2 Tabs by mouth every six (6) hours as needed for Pain. 200 Tab 1   ??? metFORMIN ER (GLUCOPHAGE XR) 500 mg tablet TAKE 5 TABLETS BY MOUTH DAILY 450 Tab 1   ??? gabapentin (NEURONTIN) 300 mg capsule TAKE 3 CAPSULES BY MOUTH THREE (3) TIMES DAILY. (Patient taking differently: take 2 caps three times a day) 810 Cap 1   ??? insulin detemir (LEVEMIR) 100 unit/mL injection 0.25 mL by SubCUTAneous route nightly. Inject 34 units at bedtime  DX:  250.00 (Patient taking differently: 36 Units by SubCUTAneous route nightly. Inject 34 units at bedtime  DX:  250.00) 10 mL 11    ??? lidocaine (LIDODERM) 5 %(700 mg/patch) 1 patch by TransDERmal route every twelve (12) hours. Apply patch to the affected area for 12 hours a day and remove for 12 hours a day. 30 Package 6   ??? multivitamin (ONE A DAY) tablet Take 1 Tab by mouth daily.     ??? ezetimibe (ZETIA) 10 mg tablet Take  by mouth nightly.     ??? aspirin (ASPIRIN) 325 mg tablet Take 325 mg by mouth daily.     ??? carvedilol (COREG) 6.25 mg tablet Take 6.25 mg by mouth daily.     ??? NITROSTAT 0.4 mg SL tablet TAKE 1 TABLET SL EVERY 5 MINUTES AS NEEDED FOR CHEST PAIN AS DIRECTED 50 Tab 1   ??? bumetanide (BUMEX) 2 mg tablet Take 2 mg by mouth daily.     ??? pravastatin (PRAVACHOL) 40 mg tablet Take 40 mg by mouth nightly.     ??? insulin lispro (HUMALOG) 100 unit/mL kwikpen Inject 2-10 units three times daily 1 Package 11   ??? methocarbamol (ROBAXIN) 500 mg tablet TAKE 2 TABLETS 4 TIMES A DAY 100 Tab 0   ??? Cholecalciferol, Vitamin D3, (VITAMIN D3) 1,000 unit Cap Take 5,000  Units by mouth daily.     ??? omega-3 fatty acids-vitamin e (FISH OIL) 1,000 mg Cap Take 1,200 mg by mouth two (2) times a day.       Allergies   Allergen Reactions   ??? Morphine Anaphylaxis     Tolerates oxycodone without problem   ??? Avalide [Irbesartan-Hydrochlorothiazide] Myalgia   ??? Demerol [Meperidine] Other (comments)     Makes her feel crazy   ??? Pcn [Penicillins] Swelling   ??? Statins-Hmg-Coa Reductase Inhibitors Myalgia   ??? Sulfa (Sulfonamide Antibiotics) Unknown (comments)   ??? Tricor [Fenofibrate Micronized] Myalgia     Past Medical History   Diagnosis Date   ??? Hypertension    ??? CAD (coronary artery disease)    ??? Chronic pain    ??? Arthritis    ??? COPD    ??? Diabetes (HCC)    ??? Goiter 04/20/2012   ??? GERD (gastroesophageal reflux disease)    ??? Neuropathy 04/06/2013   ??? Cervical spondylolysis      Past Surgical History   Procedure Laterality Date   ??? Pr cardiac surg procedure unlist       PTCA   ??? 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 Hx knee arthroscopy Right      X2    ??? Hx other surgical  1970     GANGLION CYST   ??? Hx tonsillectomy  1963     Family History   Problem Relation Age of Onset   ??? Lung Disease Mother    ??? Stroke Father    ??? Lung Disease Brother      COPD   ??? Hypertension Brother    ??? Cancer Brother      PROSTATE   ??? Heart Disease Brother    ??? Diabetes Brother    ??? Anesth Problems Neg Hx      History   Substance Use Topics   ??? Smoking status: Current Every Day Smoker -- 0.30 packs/day for 45 years     Types: Cigarettes   ??? Smokeless tobacco: Never Used      Comment: passed ready   ??? Alcohol Use: Yes      Comment: may be 3 times a year if that          Objective:  BP 140/70 mmHg   Ht 5' 5.5" (1.664 m)   Wt 212 lb (96.163 kg)   BMI 34.73 kg/m2  wdwn 65 yo wf  In NAD. A&O.  HEENT -- Pupils round.  O/P Clear.  Neck -- Supple. No JVD.  Heart -- RRR. No R/M/G.  Lungs -- CTA.  Abdomen -- Soft. Non-tender. Non-distended. No masses. Bowel sounds present.  Extremities -- No edema. Good pulses        Assessment/Plan:    ICD-10-CM ICD-9-CM    1. Mixed hyperlipidemia E78.2 272.2 AMB POC LIPID PROFILE      PR COLLECTION VENOUS BLOOD,VENIPUNCTURE      METABOLIC PANEL, COMPREHENSIVE      AMB POC COMPLETE CBC,AUTOMATED ENTER   2. Type II diabetes mellitus, uncontrolled (HCC) E11.65 250.02 AMB POC HEMOGLOBIN A1C      AMB POC URINALYSIS DIP STICK AUTO W/ MICRO      CANCELED: AMB POC URINALYSIS DIP STICK MANUAL W/O MICRO   3. Pyuria N39.0 791.9 CULTURE, URINE     Note for pet- for emotional support.      Author:  Lake Bells, MD 01/31/2015 2:27 PM

## 2015-01-31 NOTE — Patient Instructions (Signed)
Results for orders placed or performed in visit on 01/31/15   AMB POC HEMOGLOBIN A1C   Result Value Ref Range    Hemoglobin A1c (POC) 7.0 (A) 4.8 - 5.6 %   AMB POC LIPID PROFILE   Result Value Ref Range    Cholesterol (POC) 133 100 - 199 mg/dL    Triglycerides (POC) 178 (A) 0 - 150 mg/dL    HDL Cholesterol (POC) 63 35 - 150 mg/dL    LDL Cholesterol (POC) 34 0 - 129 mg/dL    Non-HDL Goal (POC) 70     TChol/HDL Ratio (POC) 2.1 0.0 - 5.0 CALC   AMB POC COMPLETE CBC,AUTOMATED ENTER   Result Value Ref Range    WBC (POC) 9.7 4.5 - 10.5 K/uL    LYMPHOCYTES (POC) 27.6 20.5 - 51.1 %    MONOCYTES (POC) 6.9 1.7 - 9.3 %    GRANULOCYTES (POC) 65.5 42.2 - 75.2 %    ABS. LYMPHS (POC) 2.7 1.2 - 3.4 K/uL    ABS. MONOS (POC) 0.7 (A) 0.1 - 0.6 10^3/ul    ABS. GRANS (POC) 6.4 1.4 - 6.5 10^3/ul    RBC (POC) 4.38 4.00 - 6.00 M/uL    HGB (POC) 12.0 11 - 18 g/dL    HCT (POC) 16.138.5 09.635.0 - 60.0 %    MCV (POC) 87.9 80.0 - 99.9 fL    MCH (POC) 27.4 27.0 - 31.0 pg    MCHC (POC) 31.2 (A) 33.0 - 37.0 g/dL    RDW (POC) 04.515.0 (A) 40.911.6 - 13.7 %    PLATELET (POC) 235 150 - 450 K/uL    MPV (POC) 7.6 (A) 7.8 - 11 fL   AMB POC URINALYSIS DIP STICK AUTO W/ MICRO   Result Value Ref Range    Color (UA POC) Yellow     Clarity (UA POC) Clear     Glucose (UA POC) Negative Negative    Bilirubin (UA POC) Negative Negative    Ketones (UA POC) Negative Negative    Specific gravity (UA POC) 1.030 1.001 - 1.035    Blood (UA POC) 1+ Negative    pH (UA POC) 5.5 4.6 - 8.0    Protein (UA POC) 1+ Negative mg/dL    Urobilinogen (UA POC) 0.2 mg/dL 0.2 - 1    Nitrites (UA POC) Negative Negative    Leukocyte esterase (UA POC) 1+ Negative

## 2015-02-01 LAB — METABOLIC PANEL, COMPREHENSIVE
A-G Ratio: 2 (ref 1.1–2.5)
ALT (SGPT): 23 IU/L (ref 0–32)
AST (SGOT): 22 IU/L (ref 0–40)
Albumin: 4 g/dL (ref 3.6–4.8)
Alk. phosphatase: 65 IU/L (ref 39–117)
BUN/Creatinine ratio: 19 (ref 11–26)
BUN: 15 mg/dL (ref 8–27)
Bilirubin, total: 0.2 mg/dL (ref 0.0–1.2)
CO2: 24 mmol/L (ref 18–29)
Calcium: 9.2 mg/dL (ref 8.7–10.3)
Chloride: 98 mmol/L (ref 97–108)
Creatinine: 0.8 mg/dL (ref 0.57–1.00)
GFR est AA: 90 mL/min/{1.73_m2} (ref >59)
GFR est non-AA: 78 mL/min/{1.73_m2} (ref >59)
GLOBULIN, TOTAL: 2 g/dL (ref 1.5–4.5)
Glucose: 195 mg/dL — ABNORMAL HIGH (ref 65–99)
Potassium: 4.1 mmol/L (ref 3.5–5.2)
Protein, total: 6 g/dL (ref 6.0–8.5)
Sodium: 138 mmol/L (ref 134–144)

## 2015-02-05 LAB — CULTURE, URINE

## 2015-02-06 NOTE — Progress Notes (Signed)
Quick Note:        Called patient and relayed doctor's message    ______

## 2015-02-06 NOTE — Progress Notes (Signed)
Quick Note:        pls call- labs look ok, urine culture negative. Work on increasing exercise. See you soon    ______

## 2015-02-12 MED ORDER — CHANTIX CONTINUING MONTH BOX 1 MG TABLET
1 mg | ORAL_TABLET | ORAL | Status: DC
Start: 2015-02-12 — End: 2015-03-17

## 2015-02-19 MED ORDER — NITROSTAT 0.4 MG SUBLINGUAL TABLET
0.4 mg | ORAL_TABLET | SUBLINGUAL | Status: DC
Start: 2015-02-19 — End: 2015-07-09

## 2015-03-01 MED ORDER — LEVEMIR FLEXTOUCH U-100 INSULIN 100 UNIT/ML (3 ML) SUBCUTANEOUS PEN
100 unit/mL (3 mL) | PEN_INJECTOR | SUBCUTANEOUS | Status: DC
Start: 2015-03-01 — End: 2015-03-16

## 2015-03-16 MED ORDER — EZETIMIBE 10 MG TAB
10 mg | ORAL_TABLET | Freq: Every evening | ORAL | Status: DC
Start: 2015-03-16 — End: 2016-04-30

## 2015-03-16 MED ORDER — SOLIFENACIN 10 MG TAB
10 mg | ORAL_TABLET | Freq: Every day | ORAL | Status: DC
Start: 2015-03-16 — End: 2016-06-16

## 2015-03-16 MED ORDER — PANTOPRAZOLE 40 MG TAB, DELAYED RELEASE
40 mg | ORAL_TABLET | ORAL | Status: DC
Start: 2015-03-16 — End: 2015-07-09

## 2015-03-16 MED ORDER — INSULIN DETEMIR 100 UNIT/ML (3 ML) SUB-Q PEN
100 unit/mL (3 mL) | SUBCUTANEOUS | Status: DC
Start: 2015-03-16 — End: 2016-07-08

## 2015-03-16 MED ORDER — INSULIN LISPRO 100 UNIT/ML (3 ML) SUB-Q PEN
100 unit/mL | PACK | SUBCUTANEOUS | Status: DC
Start: 2015-03-16 — End: 2016-07-08

## 2015-03-16 MED ORDER — PRAVASTATIN 40 MG TAB
40 mg | ORAL_TABLET | Freq: Every evening | ORAL | Status: DC
Start: 2015-03-16 — End: 2016-09-30

## 2015-03-16 MED ORDER — DULOXETINE 60 MG CAP, DELAYED RELEASE
60 mg | ORAL_CAPSULE | ORAL | Status: DC
Start: 2015-03-16 — End: 2016-03-30

## 2015-03-16 MED ORDER — METFORMIN SR 500 MG 24 HR TABLET
500 mg | ORAL_TABLET | ORAL | Status: DC
Start: 2015-03-16 — End: 2016-04-30

## 2015-03-16 NOTE — Telephone Encounter (Signed)
Pt wants all meds 90 day supply  [facility] ph # [phone]  Pt ph # [phone]

## 2015-03-16 NOTE — Telephone Encounter (Signed)
Returned call and order with [name] on the phone

## 2015-03-16 NOTE — Telephone Encounter (Signed)
Becky called you back on pt Kelsey [name]  Becky ph # [phone]

## 2015-03-18 MED ORDER — CHANTIX CONTINUING MONTH BOX 1 MG TABLET
1 mg | ORAL_TABLET | ORAL | Status: DC
Start: 2015-03-18 — End: 2015-12-10

## 2015-05-15 NOTE — Telephone Encounter (Signed)
Called in scripts as requested

## 2015-05-15 NOTE — Telephone Encounter (Signed)
May from [facility] called and said this patient needs her tramadol and Chenix refilled  [facility] ph # [phone]

## 2015-05-16 ENCOUNTER — Encounter: Attending: Internal Medicine | Primary: Internal Medicine

## 2015-05-18 ENCOUNTER — Ambulatory Visit: Admit: 2015-05-18 | Payer: PRIVATE HEALTH INSURANCE | Attending: Internal Medicine | Primary: Internal Medicine

## 2015-05-18 ENCOUNTER — Encounter: Attending: Internal Medicine | Primary: Internal Medicine

## 2015-05-18 DIAGNOSIS — IMO0002 Reserved for concepts with insufficient information to code with codable children: Secondary | ICD-10-CM

## 2015-05-18 LAB — AMB POC HEMOGLOBIN A1C: Hemoglobin A1c (POC): 8.2 % — AB (ref 4.8–5.6)

## 2015-05-18 LAB — AMB POC LIPID PROFILE
Cholesterol (POC): 109 mg/dL (ref 100–199)
HDL Cholesterol (POC): 42 mg/dL (ref 35–150)
LDL Cholesterol (POC): 36 mg/dL (ref 0–129)
Non-HDL Goal (POC): 67
TChol/HDL Ratio (POC): 2.6 CALC (ref 0.0–5.0)
Triglycerides (POC): 154 mg/dL — AB (ref 0–150)

## 2015-05-18 LAB — AMB POC URINALYSIS DIP STICK MANUAL W/O MICRO
Bilirubin (UA POC): NEGATIVE
Blood (UA POC): NEGATIVE
Glucose (UA POC): NEGATIVE
Ketones (UA POC): NEGATIVE
Nitrites (UA POC): NEGATIVE
Protein (UA POC): NEGATIVE mg/dL
Specific gravity (UA POC): 1.015 (ref 1.001–1.035)
Urobilinogen (UA POC): NORMAL (ref 0.2–1)
pH (UA POC): 5 (ref 4.6–8.0)

## 2015-05-18 NOTE — Progress Notes (Signed)
Kelsey [name] is a 64 y.o. female and presents with   Chief Complaint   Patient presents with   ??? Memory Loss   .  Pt having problems with memory and feels like synapses are not firing correctly. Not checking blood sugars,  Has had some change in vision. And headaches.       Current Outpatient Prescriptions   Medication Sig Dispense Refill   ??? CHANTIX CONTINUING MONTH BOX 1 mg tablet TAKE 1 TABLET BY MOUTH TWICE A DAY 56 Tab 0   ??? pravastatin (PRAVACHOL) 40 mg tablet Take 1 Tab by mouth nightly. 90 Tab 4   ??? solifenacin (VESICARE) 10 mg tablet Take 1 Tab by mouth daily. 90 Tab 4   ??? metFORMIN ER (GLUCOPHAGE XR) 500 mg tablet Take 2 tablets by mouth in the morning with Breakfast and 3 tablets by mouth at bedtime 450 Tab 4   ??? DULoxetine (CYMBALTA) 60 mg capsule TAKE 1 CAPSULE BY MOUTH DAILY 90 Cap 4   ??? pantoprazole (PROTONIX) 40 mg tablet TAKE 1 TABLET BY MOUTH DAILY 90 Tab 4   ??? insulin detemir (LEVEMIR FLEXTOUCH) 100 unit/mL (3 mL) inpn INJECT 34 UNITS AT BEDTIME DX: E11.65  90 day supply 15 mL 99   ??? ezetimibe (ZETIA) 10 mg tablet Take 1 Tab by mouth nightly. 90 Tab 4   ??? insulin lispro (HUMALOG) 100 unit/mL kwikpen Inject 2-10 units three times daily per sliding scale  DX: 11.65  90 day supply 1 Package 11   ??? NITROSTAT 0.4 mg SL tablet TAKE 1 TABLET SL EVERY 5 MINUTES AS NEEDED FOR CHEST PAIN AS DIRECTED 50 Tab 0   ??? CHANTIX CONTINUING MONTH BOX 1 mg tablet TAKE 1 TABLET BY MOUTH TWICE A DAY 56 Tab 0   ??? methocarbamol (ROBAXIN) 500 mg tablet TAKE 2 TABLETS BY MOUTH 4 TIMES A DAY 100 Tab 0   ??? ALPRAZolam (XANAX) 1 mg tablet Take 1 mg by mouth nightly as needed for Anxiety.     ??? albuterol-ipratropium (DUO-NEB) 2.5 mg-0.5 mg/3 ml nebulizer solution 3 mL by Nebulization route four (4) times daily. 30 Vial 3   ??? traMADol (ULTRAM) 50 mg tablet Take 2 Tabs by mouth every six (6) hours as needed for Pain. 200 Tab 1   ??? gabapentin (NEURONTIN) 300 mg capsule TAKE 3 CAPSULES BY MOUTH THREE (3)  TIMES DAILY. (Patient taking differently: take 2 caps three times a day) 810 Cap 1   ??? lidocaine (LIDODERM) 5 %(700 mg/patch) 1 patch by TransDERmal route every twelve (12) hours. Apply patch to the affected area for 12 hours a day and remove for 12 hours a day. 30 Package 6   ??? multivitamin (ONE A DAY) tablet Take 1 Tab by mouth daily.     ??? aspirin (ASPIRIN) 325 mg tablet Take 325 mg by mouth daily.     ??? carvedilol (COREG) 6.25 mg tablet Take 6.25 mg by mouth daily.     ??? bumetanide (BUMEX) 2 mg tablet Take 2 mg by mouth daily.     ??? methocarbamol (ROBAXIN) 500 mg tablet TAKE 2 TABLETS 4 TIMES A DAY 100 Tab 0   ??? Cholecalciferol, Vitamin D3, (VITAMIN D3) 1,000 unit Cap Take 5,000 Units by mouth daily.     ??? omega-3 fatty acids-vitamin e (FISH OIL) 1,000 mg Cap Take 1,200 mg by mouth two (2) times a day.       Allergies   Allergen Reactions   ??? Morphine  Anaphylaxis     Tolerates oxycodone without problem   ??? Avalide [Irbesartan-Hydrochlorothiazide] Myalgia   ??? Demerol [Meperidine] Other (comments)     Makes her feel crazy   ??? Pcn [Penicillins] Swelling   ??? Statins-Hmg-Coa Reductase Inhibitors Myalgia   ??? Sulfa (Sulfonamide Antibiotics) Unknown (comments)   ??? Tricor [Fenofibrate Micronized] Myalgia     Past Medical History   Diagnosis Date   ??? Hypertension    ??? CAD (coronary artery disease)    ??? Chronic pain    ??? Arthritis    ??? COPD    ??? Diabetes (HCC)    ??? Goiter 04/20/2012   ??? GERD (gastroesophageal reflux disease)    ??? Neuropathy 04/06/2013   ??? Cervical spondylolysis      Past Surgical History   Procedure Laterality Date   ??? Pr cardiac surg procedure unlist       PTCA   ??? 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 Hx knee arthroscopy Right      X2   ??? Hx other surgical  1970     GANGLION CYST   ??? Hx tonsillectomy  1963     Family History   Problem Relation Age of Onset   ??? Lung Disease Mother    ??? Stroke Father    ??? Lung Disease Brother      COPD   ??? Hypertension Brother    ??? Cancer Brother      PROSTATE    ??? Heart Disease Brother    ??? Diabetes Brother    ??? Anesth Problems Neg Hx      History   Substance Use Topics   ??? Smoking status: Current Every Day Smoker -- 0.30 packs/day for 45 years     Types: Cigarettes   ??? Smokeless tobacco: Never Used      Comment: passed ready   ??? Alcohol Use: Yes      Comment: may be 3 times a year if that          Objective:  BP 140/80 mmHg   Ht 5' 5.5" (1.664 m)  wdwn 64 yo wf  In NAD. A&O.  HEENT -- Pupils round.  O/P Clear.  Neck -- Supple. No JVD.  Heart -- RRR. No R/M/G.  Lungs -- CTA.  Abdomen -- Soft. Non-tender. Non-distended. No masses. Bowel sounds present.  Extremities -- +1 edema. Good pulses and sensation  Neuro- cn 2-12 grossly intact, good strength, remembers 3/3 objects at 5 minutes.      Results for orders placed or performed in visit on 05/18/15   AMB POC URINALYSIS DIP STICK MANUAL W/O MICRO   Result Value Ref Range    Color (UA POC) Yellow     Clarity (UA POC) Slightly Cloudy     Glucose (UA POC) Negative Negative    Bilirubin (UA POC) Negative Negative    Ketones (UA POC) Negative Negative    Specific gravity (UA POC) 1.015 1.001 - 1.035    Blood (UA POC) Negative Negative    pH (UA POC) 5.0 4.6 - 8.0    Protein (UA POC) Negative Negative mg/dL    Urobilinogen (UA POC) normal 0.2 - 1    Nitrites (UA POC) Negative Negative    Leukocyte esterase (UA POC) 2+ Negative   AMB POC HEMOGLOBIN A1C   Result Value Ref Range    Hemoglobin A1c (POC) 8.2 (A) 4.8 - 5.6 %   AMB POC LIPID  PROFILE   Result Value Ref Range    Cholesterol (POC) 109 100 - 199 mg/dL    Triglycerides (POC) 154 (A) 0 - 150 mg/dL    HDL Cholesterol (POC) 42 35 - 150 mg/dL    LDL Cholesterol (POC) 36 0 - 129 mg/dL    Non-HDL Goal (POC) 67     TChol/HDL Ratio (POC) 2.6 0.0 - 5.0 CALC         Assessment/Plan:    ICD-10-CM ICD-9-CM    1. Type II diabetes mellitus, uncontrolled (HCC) E11.65 250.02 AMB POC URINALYSIS DIP STICK MANUAL W/O MICRO      AMB POC HEMOGLOBIN A1C    2. Mixed hyperlipidemia E78.2 272.2 AMB POC LIPID PROFILE      PR COLLECTION VENOUS BLOOD,VENIPUNCTURE   3. Memory loss R41.3 780.93 VITAMIN B12      METABOLIC PANEL, COMPREHENSIVE   4. Neuropathy G62.9 355.9      Check blood sugars, schedule head ct      Author:  Lake BellsNancy J Gari Hartsell, MD 05/18/2015 3:01 PM

## 2015-05-18 NOTE — Patient Instructions (Signed)
Check bs, schedule head ct

## 2015-05-19 LAB — METABOLIC PANEL, COMPREHENSIVE
A-G Ratio: 1.8 (ref 1.1–2.5)
ALT (SGPT): 22 IU/L (ref 0–32)
AST (SGOT): 31 IU/L (ref 0–40)
Albumin: 4 g/dL (ref 3.6–4.8)
Alk. phosphatase: 72 IU/L (ref 39–117)
BUN/Creatinine ratio: 16 (ref 11–26)
BUN: 15 mg/dL (ref 8–27)
Bilirubin, total: 0.3 mg/dL (ref 0.0–1.2)
CO2: 23 mmol/L (ref 18–29)
Calcium: 9.1 mg/dL (ref 8.7–10.3)
Chloride: 94 mmol/L — ABNORMAL LOW (ref 97–108)
Creatinine: 0.92 mg/dL (ref 0.57–1.00)
GFR est AA: 76 mL/min/{1.73_m2} (ref >59)
GFR est non-AA: 66 mL/min/{1.73_m2} (ref >59)
GLOBULIN, TOTAL: 2.2 g/dL (ref 1.5–4.5)
Glucose: 143 mg/dL — ABNORMAL HIGH (ref 65–99)
Potassium: 4.3 mmol/L (ref 3.5–5.2)
Protein, total: 6.2 g/dL (ref 6.0–8.5)
Sodium: 136 mmol/L (ref 134–144)

## 2015-05-19 LAB — VITAMIN B12: Vitamin B12: 294 pg/mL (ref 211–946)

## 2015-05-22 NOTE — Progress Notes (Signed)
Quick Note:        pls call- aic is worse, b12 low normal. Start 1000 mcg b12 daily. Check bs and follow up in 2 weeks    ______

## 2015-05-22 NOTE — Progress Notes (Signed)
Quick Note:        No answer    ______

## 2015-05-23 NOTE — Progress Notes (Signed)
Quick Note:        Called patient and [facility]'s message [facility]    ______

## 2015-06-05 ENCOUNTER — Ambulatory Visit: Payer: BLUE CROSS/BLUE SHIELD | Primary: Internal Medicine

## 2015-06-06 ENCOUNTER — Inpatient Hospital Stay: Admit: 2015-06-06 | Payer: BLUE CROSS/BLUE SHIELD | Attending: Internal Medicine | Primary: Internal Medicine

## 2015-06-06 DIAGNOSIS — R413 Other amnesia: Secondary | ICD-10-CM

## 2015-06-07 NOTE — Progress Notes (Signed)
Quick Note:        Spoke with patient and gave her CT results.SSW    ______

## 2015-06-07 NOTE — Progress Notes (Signed)
Quick Note:        pls call- head ct is negative for mass or bleed    ______

## 2015-06-07 NOTE — Progress Notes (Signed)
Called patient but no answer.SSW

## 2015-06-08 ENCOUNTER — Ambulatory Visit
Admit: 2015-06-08 | Discharge: 2015-06-08 | Payer: PRIVATE HEALTH INSURANCE | Attending: Internal Medicine | Primary: Internal Medicine

## 2015-06-08 DIAGNOSIS — R413 Other amnesia: Secondary | ICD-10-CM

## 2015-06-08 NOTE — Progress Notes (Signed)
Kelsey [name] is a 64 y.o. female and presents with   Chief Complaint   Patient presents with   ??? Follow-up   .  Pt feeling stressed. Did not get the message that she should start b12.  Memory still not good.  she started smoking again. Not checking blood sugars,  Is very stressed with family illnesses and deaths.      Current Outpatient Prescriptions   Medication Sig Dispense Refill   ??? CHANTIX CONTINUING MONTH BOX 1 mg tablet TAKE 1 TABLET BY MOUTH TWICE A DAY 56 Tab 0   ??? pravastatin (PRAVACHOL) 40 mg tablet Take 1 Tab by mouth nightly. 90 Tab 4   ??? solifenacin (VESICARE) 10 mg tablet Take 1 Tab by mouth daily. 90 Tab 4   ??? metFORMIN ER (GLUCOPHAGE XR) 500 mg tablet Take 2 tablets by mouth in the morning with Breakfast and 3 tablets by mouth at bedtime 450 Tab 4   ??? DULoxetine (CYMBALTA) 60 mg capsule TAKE 1 CAPSULE BY MOUTH DAILY 90 Cap 4   ??? pantoprazole (PROTONIX) 40 mg tablet TAKE 1 TABLET BY MOUTH DAILY 90 Tab 4   ??? insulin detemir (LEVEMIR FLEXTOUCH) 100 unit/mL (3 mL) inpn INJECT 34 UNITS AT BEDTIME DX: E11.65  90 day supply (Patient taking differently: 36 Units nightly. INJECT 34 UNITS AT BEDTIME DX: E11.65  90 day supply) 15 mL 99   ??? ezetimibe (ZETIA) 10 mg tablet Take 1 Tab by mouth nightly. 90 Tab 4   ??? insulin lispro (HUMALOG) 100 unit/mL kwikpen Inject 2-10 units three times daily per sliding scale  DX: 11.65  90 day supply 1 Package 11   ??? NITROSTAT 0.4 mg SL tablet TAKE 1 TABLET SL EVERY 5 MINUTES AS NEEDED FOR CHEST PAIN AS DIRECTED 50 Tab 0   ??? CHANTIX CONTINUING MONTH BOX 1 mg tablet TAKE 1 TABLET BY MOUTH TWICE A DAY 56 Tab 0   ??? methocarbamol (ROBAXIN) 500 mg tablet TAKE 2 TABLETS BY MOUTH 4 TIMES A DAY 100 Tab 0   ??? ALPRAZolam (XANAX) 1 mg tablet Take 1 mg by mouth nightly as needed for Anxiety.     ??? albuterol-ipratropium (DUO-NEB) 2.5 mg-0.5 mg/3 ml nebulizer solution 3 mL by Nebulization route four (4) times daily. 30 Vial 3    ??? traMADol (ULTRAM) 50 mg tablet Take 2 Tabs by mouth every six (6) hours as needed for Pain. 200 Tab 1   ??? gabapentin (NEURONTIN) 300 mg capsule TAKE 3 CAPSULES BY MOUTH THREE (3) TIMES DAILY. (Patient taking differently: take 2 caps three times a day) 810 Cap 1   ??? lidocaine (LIDODERM) 5 %(700 mg/patch) 1 patch by TransDERmal route every twelve (12) hours. Apply patch to the affected area for 12 hours a day and remove for 12 hours a day. 30 Package 6   ??? multivitamin (ONE A DAY) tablet Take 1 Tab by mouth daily.     ??? aspirin (ASPIRIN) 325 mg tablet Take 325 mg by mouth daily.     ??? carvedilol (COREG) 6.25 mg tablet Take 6.25 mg by mouth daily.     ??? bumetanide (BUMEX) 2 mg tablet Take 2 mg by mouth daily.     ??? methocarbamol (ROBAXIN) 500 mg tablet TAKE 2 TABLETS 4 TIMES A DAY 100 Tab 0   ??? Cholecalciferol, Vitamin D3, (VITAMIN D3) 1,000 unit Cap Take 5,000 Units by mouth daily.     ??? omega-3 fatty acids-vitamin e (FISH OIL) 1,000 mg Cap  Take 1,200 mg by mouth two (2) times a day.       Allergies   Allergen Reactions   ??? Morphine Anaphylaxis     Tolerates oxycodone without problem   ??? Avalide [Irbesartan-Hydrochlorothiazide] Myalgia   ??? Demerol [Meperidine] Other (comments)     Makes her feel crazy   ??? Pcn [Penicillins] Swelling   ??? Statins-Hmg-Coa Reductase Inhibitors Myalgia   ??? Sulfa (Sulfonamide Antibiotics) Unknown (comments)   ??? Tricor [Fenofibrate Micronized] Myalgia     Past Medical History   Diagnosis Date   ??? Hypertension    ??? CAD (coronary artery disease)    ??? Chronic pain    ??? Arthritis    ??? COPD    ??? Diabetes (HCC)    ??? Goiter 04/20/2012   ??? GERD (gastroesophageal reflux disease)    ??? Neuropathy 04/06/2013   ??? Cervical spondylolysis      Past Surgical History   Procedure Laterality Date   ??? Pr cardiac surg procedure unlist       PTCA   ??? 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 Hx knee arthroscopy Right      X2   ??? Hx other surgical  1970     GANGLION CYST   ??? Hx tonsillectomy  1963      Family History   Problem Relation Age of Onset   ??? Lung Disease Mother    ??? Stroke Father    ??? Lung Disease Brother      COPD   ??? Hypertension Brother    ??? Cancer Brother      PROSTATE   ??? Heart Disease Brother    ??? Diabetes Brother    ??? Anesth Problems Neg Hx      History   Substance Use Topics   ??? Smoking status: Current Every Day Smoker -- 0.30 packs/day for 45 years     Types: Cigarettes   ??? Smokeless tobacco: Never Used      Comment: passed ready   ??? Alcohol Use: Yes      Comment: may be 3 times a year if that          Objective:  BP 122/70 mmHg   Ht 5' 5.5" (1.664 m)   Wt 205 lb (92.987 kg)   BMI 33.58 kg/m2  wdwn 64 yo wf  In NAD. A&O.  HEENT -- Pupils round.  O/P Clear.  Neck -- Supple. No JVD.  Heart -- RRR. No R/M/G.  Lungs -- CTA.  Abdomen -- Soft. Non-tender. Non-distended. No masses. Bowel sounds present.  Extremities -- No edema.        Assessment/Plan:    ICD-10-CM ICD-9-CM    1. Memory loss R41.3 780.93 REFERRAL TO NEUROLOGY     Start checking blood sugars, start b12 1000 mcg daily. If bs > 140 in am, increase levemir to 38 units- given ssi      Author:  Lake Bells, MD 06/08/2015 2:51 PM

## 2015-06-08 NOTE — Patient Instructions (Addendum)
Check blood sugars  If fasting bs over 140 in am, increase levemir to 38 units.  Take humalog at dinner  If bs 150-200 2 units  201-250- 4 units  251-300  6 units  301-350- 8 units  351- 400  10 units  >400 12 units

## 2015-06-19 MED ORDER — GABAPENTIN 300 MG CAP
300 mg | ORAL_CAPSULE | ORAL | Status: DC
Start: 2015-06-19 — End: 2015-11-01

## 2015-07-09 ENCOUNTER — Ambulatory Visit
Admit: 2015-07-09 | Discharge: 2015-07-09 | Payer: PRIVATE HEALTH INSURANCE | Attending: Neurology | Primary: Internal Medicine

## 2015-07-09 DIAGNOSIS — R413 Other amnesia: Secondary | ICD-10-CM

## 2015-07-09 NOTE — Progress Notes (Signed)
[facility]   NEW PATIENT EVALUATION/CONSULTATION       PATIENT NAME: Kelsey HeadsDiana H Dykman    MRN: [id number]    REASON FOR CONSULTATION: Memory impairment    07/09/2015      Previous records (physician notes, laboratory reports, and radiology reports) and imaging studies were reviewed and summarized. My recommendations will be communicated back to the patient's physician(s) via electronic medical record and/or by US mail.       HISTORY OF PRESENT ILLNESS:  Kelsey [name] is a 64 y.o. right handed female presenting for evaluation of memory impairment.      Onset and progression: 2-3 months    Neuropsychiatric symptoms     Problems with judgment:NO   Reduced interest in hobbies/activities: YES   Repeats questions, stories, or statements: YES    Trouble recalling people's names: YES   Trouble learning how to use a tool or appliance: NO   Forgetting the correct month or year: NO   Difficulty handling financial affairs (bill-paying, taxes): NO   Difficulty remembering appointments:YES    Memory:  She states she is having difficulty remembering things that are "not important".  Also reports decreased attention span.    Language: No difficulty with naming  Change in personality: More withdrawn   Socially inappropriate behavior:None  Change in eating habits: None  Physical changes: Intentional weight loss- 10 lbs over last 6 weeks  Depressive symptoms: h/o depression, taking Cymbalta  Hallucinations/Delusions:  None    Ability to function:  Driving: still driving, not getting lost  Finances: no difficulties  Cooking: managing to cook on her own  Manages own medication:  Yes, no difficulty     Prior work-up:   CT Head: normal    Prior treatments: None      PAST MEDICAL HISTORY:  Past Medical History   Diagnosis Date   ??? Hypertension    ??? CAD (coronary artery disease)    ??? Chronic pain    ??? Arthritis    ??? COPD    ??? Diabetes (HCC)    ??? Goiter 04/20/2012   ??? GERD (gastroesophageal reflux disease)     ??? Neuropathy 04/06/2013   ??? Cervical spondylolysis        PAST SURGICAL HISTORY:  Past Surgical History   Procedure Laterality Date   ??? Pr cardiac surg procedure unlist       PTCA   ??? 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 Hx knee arthroscopy Right      X2   ??? Hx other surgical  1970     GANGLION CYST   ??? Hx tonsillectomy  1963       FAMILY HISTORY:   Family History   Problem Relation Age of Onset   ??? Lung Disease Mother    ??? Stroke Father    ??? Lung Disease Brother      COPD   ??? Hypertension Brother    ??? Cancer Brother      PROSTATE   ??? Heart Disease Brother    ??? Diabetes Brother    ??? Anesth Problems Neg Hx          SOCIAL HISTORY:  History     Social History   ??? Marital Status: MARRIED     Spouse Name: N/A   ??? Number of Children: N/A   ??? Years of Education: N/A     Social History Main Topics   ??? Smoking status: Current  Every Day Smoker -- 0.30 packs/day for 45 years     Types: Cigarettes   ??? Smokeless tobacco: Never Used      Comment: passed ready   ??? Alcohol Use: Yes      Comment: may be 3 times a year if that   ??? Drug Use: No   ??? Sexual Activity: Not on file     Other Topics Concern   ??? None     Social History Narrative         MEDICATIONS:   Current Outpatient Prescriptions   Medication Sig Dispense Refill   ??? cyanocobalamin 1,000 mcg tablet Take 1,000 mcg by mouth daily.     ??? MAGNESIUM CARBONATE by Does Not Apply route.     ??? gabapentin (NEURONTIN) 300 mg capsule TAKE 3 CAPSULES BY MOUTH 3 TIMES A DAY 810 Cap 0   ??? CHANTIX CONTINUING MONTH BOX 1 mg tablet TAKE 1 TABLET BY MOUTH TWICE A DAY 56 Tab 0   ??? pravastatin (PRAVACHOL) 40 mg tablet Take 1 Tab by mouth nightly. 90 Tab 4   ??? solifenacin (VESICARE) 10 mg tablet Take 1 Tab by mouth daily. 90 Tab 4   ??? metFORMIN ER (GLUCOPHAGE XR) 500 mg tablet Take 2 tablets by mouth in the morning with Breakfast and 3 tablets by mouth at bedtime 450 Tab 4   ??? DULoxetine (CYMBALTA) 60 mg capsule TAKE 1 CAPSULE BY MOUTH DAILY 90 Cap 4    ??? insulin detemir (LEVEMIR FLEXTOUCH) 100 unit/mL (3 mL) inpn INJECT 34 UNITS AT BEDTIME DX: E11.65  90 day supply (Patient taking differently: 36 Units nightly. INJECT 34 UNITS AT BEDTIME DX: E11.65  90 day supply) 15 mL 99   ??? ezetimibe (ZETIA) 10 mg tablet Take 1 Tab by mouth nightly. 90 Tab 4   ??? insulin lispro (HUMALOG) 100 unit/mL kwikpen Inject 2-10 units three times daily per sliding scale  DX: 11.65  90 day supply 1 Package 11   ??? traMADol (ULTRAM) 50 mg tablet Take 2 Tabs by mouth every six (6) hours as needed for Pain. 200 Tab 1   ??? multivitamin (ONE A DAY) tablet Take 1 Tab by mouth daily.     ??? aspirin (ASPIRIN) 325 mg tablet Take 325 mg by mouth daily.     ??? carvedilol (COREG) 6.25 mg tablet Take 6.25 mg by mouth daily.     ??? bumetanide (BUMEX) 2 mg tablet Take 2 mg by mouth daily.     ??? methocarbamol (ROBAXIN) 500 mg tablet TAKE 2 TABLETS 4 TIMES A DAY 100 Tab 0   ??? Cholecalciferol, Vitamin D3, (VITAMIN D3) 1,000 unit Cap Take 5,000 Units by mouth daily.           ALLERGIES:  Allergies   Allergen Reactions   ??? Morphine Anaphylaxis     Tolerates oxycodone without problem   ??? Avalide [Irbesartan-Hydrochlorothiazide] Myalgia   ??? Demerol [Meperidine] Other (comments)     Makes her feel crazy   ??? Pcn [Penicillins] Swelling   ??? Statins-Hmg-Coa Reductase Inhibitors Myalgia   ??? Sulfa (Sulfonamide Antibiotics) Unknown (comments)   ??? Tricor [Fenofibrate Micronized] Myalgia         REVIEW OF SYSTEMS:  10 point ROS reviewed with patient. Please see scanned document under media.       PHYSICAL EXAM:  Vital Signs: Ht 5\' 5"  (1.651 m)   Wt 92.08 kg (203 lb)   BMI 33.78 kg/m2     General Medical  Exam:  General:  Well appearing, comfortable, in no apparent distress.  Eyes/ENT: see cranial nerve examination.  Neck: No masses appreciated.  Full range of motion without tenderness.  Respiratory:  Clear to auscultation, good air entry bilaterally.  Cardiac:  Regular rate and rhythm, no murmur.    GI:  Soft, non-tender, non-distended abdomen.  Bowel sounds normal. No masses, organomegaly.   Extremities:  No deformities, edema, or skin discoloration.Skin:  No rashes or lesions.    Neurological:  ?? Mental Status:  Alert and oriented to person, place, and time with fluent speech.   ?? MOCA: 26/30 (see scanned media)  ?? Cranial Nerves:   CNII/III/IV/VI: visual fields full to confrontation, EOMI, PERRL, no ptosis or nystagmus.   CN V: Facial sensation intact bilaterally, masseter 5/5   CN VII: Facial muscles symmetric and strong   CN VIII: Hears finger rub well bilaterally, intact vestibular function   CN IX/X: Normal palatal movement   CN XI: Full strength shoulder shrug bilaterally   CN XII: Tongue protrusion full and midline without fasciculation or atrophy  ?? Motor: Normal tone and muscle bulk with no pronator drift. No atrophy or fasciculations present on examination.  Individual muscle group testing:  Shoulder abduction:   Left:5/5   Right : 5/5    Shoulder adduction:   Left:5/5   Right : 5/5    Elbow flexion:      Left:5/5   Right : 5/5  Elbow extension:    Left:5/5   Right : 5/5   Wrist flexion:    Left:5/5   Right : 5/5  Wrist extension:    Left:5/5   Right : 5/5  Arm pronation:   Left:5/5   Right : 5/5  Arm supination:   Left:5/5   Right : 5/5    Finger flexion:    Left:5/5   Right : 5/5    Finger extension:   Left:5/5   Right : 5/5   Finger abduction:  Left:5/5   Right : 5/5   Finger adduction:   Left:5/5   Right : 5/5  Hip flexion:     Left:5/5   Right : 5/5         Hip extension:   Left:5/5   Right : 5/5    Knee flexion:    Left:5/5   Right : 5/5    Knee extension:   Left:5/5   Right : 5/5    Dorsiflexion:     Left:5/5   Right : 5/5  Plantar flexion:    Left:5/5   Right : 5/5      ?? MSRs: No crossed adductors or clonus.         RIGHT  LEFT   Brachioradialis 2+ 2+   Biceps 2+ 2+   Triceps 2+ 2+   Knee 2+ 2+   Achilles 1+ 1+        Plantar response Downward Downward           ?? Sensation: Normal and symmetric perception of pinprick, temperature, light touch, proprioception, and vibration; (-) Romberg.  ?? Coordination: No dysmetria. Normal rapid alternating movements; finger-to-nose and heel-to- shin testing are within normal limits.  ?? Gait: Normal native, intact toe/heel walking, difficulty with tandem gait.      PERTINENT DATA:  INTERNAL RECORDS:  The patient's electronic medical record was reviewed.  The relevant details include:  CT Results (maximum last 3):    Results from Hospital Encounter encounter on 06/06/15   CT HEAD  WO CONT   Narrative **Final Report**      ICD Codes / Adm.Diagnosis: 780.93  R41.3 / Memory loss  Other amnesia  Examination:  CT HEAD WO CON  - [id number] - Jun 06 2015 10:58AM  Accession No:  [id number]  Reason:  headache, memory loss      REPORT:  EXAM:  CT HEAD WO CON    INDICATION:   headache, memory loss    COMPARISON: None.    TECHNIQUE: Unenhanced CT of the head was performed using 5 mm images. Brain   and bone windows were generated.     FINDINGS:  The ventricles and sulci are normal in size, shape and configuration and   midline. There is no significant white matter disease. There is no   intracranial hemorrhage, extra-axial collection, mass, mass effect or   midline shift.  The basilar cisterns are open. No acute infarct is   identified. The bone windows demonstrate no abnormalities. The visualized   portions of the paranasal sinuses and mastoid air cells are clear.       IMPRESSION: Unremarkable head CT           Signing/Reading Doctor: Jonelle Sports [phone])    Approved: Genelle Bal PEAT ([id number])  Jun 06 2015 11:18AM                                        ASSESSMENT:    Encounter Diagnoses     ICD-10-CM ICD-9-CM   1. Memory impairment R41.3 780.93   2. B12 deficiency E53.8 266.2   3. Depression, unspecified depression type F32.9 2   64 yo RHF presenting with cognitive impairment x 3 months,  non-progressive.  MOCA borderline normal 26/30 (-1 language, -3 delayed recall).   Cognitive symptoms are likely related to underlying B12 deficiency, although comorbid depression may also be contributing.  She would benefit from detailed Neuropsychologic testing.     PLAN:  ?? MRI Brain WO  ?? TSH, T4, MMA  ?? Cont. B12 supplementation  ?? Neuropsych testing  ?? Heart healthy diet and exercise  ?? Regular scheduled cognitive and social engagement.  ?? F/U 3 months      Lataunya Ruud M. Lurena Nida, DO  Staff Neurologist  Diplomate, American Board of Psychiatry & Neurology       CC Referring provider:    Lake Bells, MD

## 2015-07-09 NOTE — Progress Notes (Signed)
Short term memory loss x3 months  Progressing

## 2015-07-09 NOTE — Patient Instructions (Signed)
A Healthy Lifestyle: Care Instructions  Your Care Instructions  A healthy lifestyle can help you feel good, stay at a healthy weight, and have plenty of energy for both work and play. A healthy lifestyle is something you can share with your whole family.  A healthy lifestyle also can lower your risk for serious health problems, such as high blood pressure, heart disease, and diabetes.  You can follow a few steps listed below to improve your health and the health of your family.  Follow-up care is a key part of your treatment and safety. Be sure to make and go to all appointments, and call your doctor if you are having problems. It???s also a good idea to know your test results and keep a list of the medicines you take.  How can you care for yourself at home?  ?? Do not eat too much sugar, fat, or fast foods. You can still have dessert and treats now and then. The goal is moderation.  ?? Start small to improve your eating habits. Pay attention to portion sizes, drink less juice and soda pop, and eat more fruits and vegetables.  ?? Eat a healthy amount of food. A 3-ounce serving of meat, for example, is about the size of a deck of cards. Fill the rest of your plate with vegetables and whole grains.  ?? Limit the amount of soda and sports drinks you have every day. Drink more water when you are thirsty.  ?? Eat at least 5 servings of fruits and vegetables every day. It may seem like a lot, but it is not hard to reach this goal. A serving or helping is 1 piece of fruit, 1 cup of vegetables, or 2 cups of leafy, raw vegetables. Have an apple or some carrot sticks as an afternoon snack instead of a candy bar. Try to have fruits and/or vegetables at every meal.  ?? Make exercise part of your daily routine. You may want to start with simple activities, such as walking, bicycling, or slow swimming. Try to be active 30 to 60 minutes every day. You do not need to do all 30 to 60  minutes all at once. For example, you can exercise 3 times a day for 10 or 20 minutes. Moderate exercise is safe for most people, but it is always a good idea to talk to your doctor before starting an exercise program.  ?? Keep moving. Mow the lawn, work in the garden, or clean your house. Take the stairs instead of the elevator at work.  ?? If you smoke, quit. People who smoke have an increased risk for heart attack, stroke, cancer, and other lung illnesses. Quitting is hard, but there are ways to boost your chance of quitting tobacco for good.  ?? Use nicotine gum, patches, or lozenges.  ?? Ask your doctor about stop-smoking programs and medicines.  ?? Keep trying.  In addition to reducing your risk of diseases in the future, you will notice some benefits soon after you stop using tobacco. If you have shortness of breath or asthma symptoms, they will likely get better within a few weeks after you quit.  ?? Limit how much alcohol you drink. Moderate amounts of alcohol (up to 2 drinks a day for men, 1 drink a day for women) are okay. But drinking too much can lead to liver problems, high blood pressure, and other health problems.  Family health  If you have a family, there are many things you can do   together to improve your health.  ?? Eat meals together as a family as often as possible.  ?? Eat healthy foods. This includes fruits, vegetables, lean meats and dairy, and whole grains.  ?? Include your family in your fitness plan. Most people think of activities such as jogging or tennis as the way to fitness, but there are many ways you and your family can be more active. Anything that makes you breathe hard and gets your heart pumping is exercise. Here are some tips:  ?? Walk to do errands or to take your child to school or the bus.  ?? Go for a family bike ride after dinner instead of watching TV.  Where can you learn more?  Go to http://www.healthwise.net/[employer]   Enter U807 in the search box to learn more about "A Healthy Lifestyle: Care Instructions."  ?? 2006-2016 [employer]. Care instructions adapted under license by [employer] (which disclaims liability or warranty for this information). This care instruction is for use with your licensed healthcare professional. If you have questions about a medical condition or this instruction, always ask your healthcare professional. Healthwise, Inc[employer] disclaims any warranty or liability for your use of this information.  Content Version: 10.9.538570; Current as of: November 10, 2014

## 2015-07-11 LAB — METHYLMALONIC ACID: METHYLMALONIC ACID, SERUM: 162 nmol/L (ref 0–378)

## 2015-07-11 LAB — T4, FREE: T4, Free: 1.04 ng/dL (ref 0.82–1.77)

## 2015-07-11 LAB — TSH 3RD GENERATION: TSH: 2.18 u[IU]/mL (ref 0.450–4.500)

## 2015-07-13 ENCOUNTER — Encounter: Attending: Internal Medicine | Primary: Internal Medicine

## 2015-07-27 ENCOUNTER — Encounter: Attending: Internal Medicine | Primary: Internal Medicine

## 2015-08-07 ENCOUNTER — Inpatient Hospital Stay: Admit: 2015-08-07 | Payer: BLUE CROSS/BLUE SHIELD | Attending: Neurology | Primary: Internal Medicine

## 2015-08-07 DIAGNOSIS — R413 Other amnesia: Secondary | ICD-10-CM

## 2015-08-10 ENCOUNTER — Encounter: Attending: Internal Medicine | Primary: Internal Medicine

## 2015-09-07 ENCOUNTER — Ambulatory Visit
Admit: 2015-09-07 | Discharge: 2015-09-07 | Payer: PRIVATE HEALTH INSURANCE | Attending: Internal Medicine | Primary: Internal Medicine

## 2015-09-07 DIAGNOSIS — R059 Cough, unspecified: Secondary | ICD-10-CM

## 2015-09-07 LAB — AMB POC LIPID PROFILE
Cholesterol (POC): 133 mg/dL (ref 100–199)
HDL Cholesterol (POC): 54 mg/dL (ref 35–150)
LDL Cholesterol (POC): 36 mg/dL (ref 0–129)
Non-HDL Goal (POC): 79
TChol/HDL Ratio (POC): 2.5 CALC (ref 0.0–5.0)
Triglycerides (POC): 216 mg/dL — AB (ref 0–150)

## 2015-09-07 LAB — AMB POC COMPLETE CBC,AUTOMATED ENTER
ABS. GRANS (POC): 6.8 10*3/uL — AB (ref 1.4–6.5)
ABS. LYMPHS (POC): 2.9 10*3/uL (ref 1.2–3.4)
ABS. MONOS (POC): 0.2 10*3/uL (ref 0.1–0.6)
GRANULOCYTES (POC): 68.4 % (ref 42.2–75.2)
HCT (POC): 36.9 % (ref 35.0–60.0)
HGB (POC): 11.9 g/dL (ref 11–18)
LYMPHOCYTES (POC): 29.5 % (ref 20.5–51.1)
MCH (POC): 25.8 pg — AB (ref 27.0–31.0)
MCHC (POC): 32.1 g/dL — AB (ref 33.0–37.0)
MCV (POC): 80.2 fL (ref 80.0–99.9)
MONOCYTES (POC): 2.1 % (ref 1.7–9.3)
MPV (POC): 6.6 fL — AB (ref 7.8–11)
PLATELET (POC): 288 10*3/uL (ref 150–450)
RBC (POC): 4.61 M/uL (ref 4.00–6.00)
RDW (POC): 16.4 % — AB (ref 11.6–13.7)
WBC (POC): 9.9 10*3/uL (ref 4.5–10.5)

## 2015-09-07 LAB — AMB POC URINALYSIS DIP STICK MANUAL W/O MICRO
Bilirubin (UA POC): NEGATIVE
Blood (UA POC): NEGATIVE
Glucose (UA POC): NEGATIVE
Ketones (UA POC): NEGATIVE
Nitrites (UA POC): NEGATIVE
Protein (UA POC): NEGATIVE mg/dL
Specific gravity (UA POC): 1.005 (ref 1.001–1.035)
Urobilinogen (UA POC): 0.2 (ref 0.2–1)
pH (UA POC): 5.5 (ref 4.6–8.0)

## 2015-09-07 LAB — AMB POC HEMOGLOBIN A1C: Hemoglobin A1c (POC): 6.9 % — AB (ref 4.8–5.6)

## 2015-09-07 MED ORDER — AZITHROMYCIN 250 MG TAB
250 mg | ORAL_TABLET | ORAL | 0 refills | Status: AC
Start: 2015-09-07 — End: 2015-09-12

## 2015-09-07 NOTE — Patient Instructions (Signed)
Results for orders placed or performed in visit on 09/07/15   AMB POC URINALYSIS DIP STICK MANUAL W/O MICRO   Result Value Ref Range    Color (UA POC) Yellow     Clarity (UA POC) Clear     Glucose (UA POC) Negative Negative    Bilirubin (UA POC) Negative Negative    Ketones (UA POC) Negative Negative    Specific gravity (UA POC) 1.005 1.001 - 1.035    Blood (UA POC) Negative Negative    pH (UA POC) 5.5 4.6 - 8.0    Protein (UA POC) Negative Negative mg/dL    Urobilinogen (UA POC) 0.2 mg/dL 0.2 - 1    Nitrites (UA POC) Negative Negative    Leukocyte esterase (UA POC) Trace Negative   AMB POC HEMOGLOBIN A1C   Result Value Ref Range    Hemoglobin A1c (POC) 6.9 (A) 4.8 - 5.6 %   AMB POC LIPID PROFILE   Result Value Ref Range    Cholesterol (POC) 133 100 - 199 mg/dL    Triglycerides (POC) 216 (A) 0 - 150 mg/dL    HDL Cholesterol (POC) 54 35 - 150 mg/dL    LDL Cholesterol (POC) 36 0 - 129 mg/dL    Non-HDL Goal (POC) 79     TChol/HDL Ratio (POC) 2.5 0.0 - 5.0 CALC   AMB POC COMPLETE CBC,AUTOMATED ENTER   Result Value Ref Range    WBC (POC) 9.9 4.5 - 10.5 K/uL    LYMPHOCYTES (POC) 29.5 20.5 - 51.1 %    MONOCYTES (POC) 2.1 1.7 - 9.3 %    GRANULOCYTES (POC) 68.4 42.2 - 75.2 %    ABS. LYMPHS (POC) 2.9 1.2 - 3.4 K/uL    ABS. MONOS (POC) 0.2 0.1 - 0.6 10^3/ul    ABS. GRANS (POC) 6.8 (A) 1.4 - 6.5 10^3/ul    RBC (POC) 4.61 4.00 - 6.00 M/uL    HGB (POC) 11.9 11 - 18 g/dL    HCT (POC) 91.4 78.2 - 60.0 %    MCV (POC) 80.2 80.0 - 99.9 fL    MCH (POC) 25.8 (A) 27.0 - 31.0 pg    MCHC (POC) 32.1 (A) 33.0 - 37.0 g/dL    RDW (POC) 95.6 (A) 21.3 - 13.7 %    PLATELET (POC) 288 150 - 450 K/uL    MPV (POC) 6.6 (A) 7.8 - 11 fL     Warm salt water gargles

## 2015-09-07 NOTE — Progress Notes (Signed)
Kelsey [name] is a 64 y.o. female and presents with   Chief Complaint   Patient presents with   ??? Cold     URI and diabetic check   .  Feeling better today.   Had sore  Throat.- started Tues.  Now has cough productive of tan phlegm.  Had temp up to 99 last night.  Eating poorly, not checking blood sugars      Current Outpatient Prescriptions   Medication Sig Dispense Refill   ??? azithromycin (ZITHROMAX) 250 mg tablet Take 1 Tab by mouth See Admin Instructions for 5 days. 6 Tab 0   ??? cyanocobalamin 1,000 mcg tablet Take 1,000 mcg by mouth daily.     ??? MAGNESIUM CARBONATE by Does Not Apply route.     ??? gabapentin (NEURONTIN) 300 mg capsule TAKE 3 CAPSULES BY MOUTH 3 TIMES A DAY 810 Cap 0   ??? CHANTIX CONTINUING MONTH BOX 1 mg tablet TAKE 1 TABLET BY MOUTH TWICE A DAY 56 Tab 0   ??? pravastatin (PRAVACHOL) 40 mg tablet Take 1 Tab by mouth nightly. 90 Tab 4   ??? solifenacin (VESICARE) 10 mg tablet Take 1 Tab by mouth daily. 90 Tab 4   ??? metFORMIN ER (GLUCOPHAGE XR) 500 mg tablet Take 2 tablets by mouth in the morning with Breakfast and 3 tablets by mouth at bedtime 450 Tab 4   ??? DULoxetine (CYMBALTA) 60 mg capsule TAKE 1 CAPSULE BY MOUTH DAILY 90 Cap 4   ??? insulin detemir (LEVEMIR FLEXTOUCH) 100 unit/mL (3 mL) inpn INJECT 34 UNITS AT BEDTIME DX: E11.65  90 day supply (Patient taking differently: 36 Units nightly. INJECT 34 UNITS AT BEDTIME DX: E11.65  90 day supply) 15 mL 99   ??? ezetimibe (ZETIA) 10 mg tablet Take 1 Tab by mouth nightly. 90 Tab 4   ??? insulin lispro (HUMALOG) 100 unit/mL kwikpen Inject 2-10 units three times daily per sliding scale  DX: 11.65  90 day supply 1 Package 11   ??? traMADol (ULTRAM) 50 mg tablet Take 2 Tabs by mouth every six (6) hours as needed for Pain. 200 Tab 1   ??? multivitamin (ONE A DAY) tablet Take 1 Tab by mouth daily.     ??? aspirin (ASPIRIN) 325 mg tablet Take 325 mg by mouth daily.     ??? carvedilol (COREG) 6.25 mg tablet Take 6.25 mg by mouth daily.      ??? bumetanide (BUMEX) 2 mg tablet Take 2 mg by mouth daily.     ??? methocarbamol (ROBAXIN) 500 mg tablet TAKE 2 TABLETS 4 TIMES A DAY 100 Tab 0   ??? Cholecalciferol, Vitamin D3, (VITAMIN D3) 1,000 unit Cap Take 5,000 Units by mouth daily.       Allergies   Allergen Reactions   ??? Morphine Anaphylaxis     Tolerates oxycodone without problem   ??? Avalide [Irbesartan-Hydrochlorothiazide] Myalgia   ??? Demerol [Meperidine] Other (comments)     Makes her feel crazy   ??? Pcn [Penicillins] Swelling   ??? Statins-Hmg-Coa Reductase Inhibitors Myalgia   ??? Sulfa (Sulfonamide Antibiotics) Unknown (comments)   ??? Tricor [Fenofibrate Micronized] Myalgia     Past Medical History   Diagnosis Date   ??? Arthritis    ??? CAD (coronary artery disease)    ??? Cervical spondylolysis    ??? Chronic pain    ??? COPD    ??? Diabetes (HCC)    ??? GERD (gastroesophageal reflux disease)    ??? Goiter 04/20/2012   ???  Hypertension    ??? Neuropathy 04/06/2013     Past Surgical History   Procedure Laterality Date   ??? Pr cardiac surg procedure unlist       PTCA   ??? 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 Hx knee arthroscopy Right      X2   ??? Hx other surgical  1970     GANGLION CYST   ??? Hx tonsillectomy  1963     Family History   Problem Relation Age of Onset   ??? Lung Disease Mother    ??? Stroke Father    ??? Lung Disease Brother      COPD   ??? Hypertension Brother    ??? Cancer Brother      PROSTATE   ??? Heart Disease Brother    ??? Diabetes Brother    ??? Anesth Problems Neg Hx      Social History   Substance Use Topics   ??? Smoking status: Current Every Day Smoker     Packs/day: 0.30     Years: 45.00     Types: Cigarettes   ??? Smokeless tobacco: Never Used      Comment: passed ready   ??? Alcohol use Yes      Comment: may be 3 times a year if that          Objective:  Visit Vitals   ??? BP 110/60   ??? Temp 98.6 ??F (37 ??C)   ??? Ht  (1.651 m)   ??? Wt 203 lb (92.1 kg)   ??? BMI 33.78 kg/m2     wdwn 64 yo wf-   In NAD. A&O.  HEENT -- Pupils round.  O/P Clear. Tm- dull   Neck -- Supple. No JVD.no acn  Heart -- RRR. No R/M/G.  Lungs -- Coarse breath sounds  Abdomen -- Soft. Non-tender. Non-distended. No masses. Bowel sounds present.  Extremities -- No edema. +1 pulses      .thid    Assessment/Plan:    ICD-10-CM ICD-9-CM    1. Cough R05 786.2 azithromycin (ZITHROMAX) 250 mg tablet   2. Mixed hyperlipidemia E78.2 272.2 AMB POC LIPID PROFILE      AMB POC COMPLETE CBC,AUTOMATED ENTER      TSH 3RD GENERATION   3. Type II diabetes mellitus, uncontrolled (HCC) E11.65 250.02 AMB POC URINALYSIS DIP STICK MANUAL W/O MICRO      AMB POC HEMOGLOBIN A1C      PR COLLECTION VENOUS BLOOD,VENIPUNCTURE   4. Other chest pain R07.89 786.59      Mucinex,  Warm salt water gargles.      Author:  Lake Bells, MD 09/07/2015 2:30 PM

## 2015-09-08 LAB — TSH 3RD GENERATION: TSH: 2.17 u[IU]/mL (ref 0.450–4.500)

## 2015-09-10 NOTE — Telephone Encounter (Signed)
Called patient and relayed doctor's message

## 2015-09-10 NOTE — Telephone Encounter (Signed)
-----   Message from Lake Bells, MD sent at 09/10/2015 12:41 PM EDT -----  pls call= thyroid is fine, aic improved,  Hope you are feeling better.  Follow up in December.

## 2015-09-10 NOTE — Progress Notes (Signed)
pls call= thyroid is fine, aic improved,  Hope you are feeling better.  Follow up in December.

## 2015-09-13 ENCOUNTER — Ambulatory Visit
Admit: 2015-09-13 | Discharge: 2015-09-13 | Payer: PRIVATE HEALTH INSURANCE | Attending: Internal Medicine | Primary: Internal Medicine

## 2015-09-13 DIAGNOSIS — R410 Disorientation, unspecified: Secondary | ICD-10-CM

## 2015-09-13 LAB — AMB POC COMPLETE CBC,AUTOMATED ENTER
ABS. GRANS (POC): 7.3 10*3/uL — AB (ref 1.4–6.5)
ABS. LYMPHS (POC): 2.7 10*3/uL (ref 1.2–3.4)
ABS. MONOS (POC): 0.4 10*3/uL (ref 0.1–0.6)
GRANULOCYTES (POC): 69.9 % (ref 42.2–75.2)
HCT (POC): 35.6 % (ref 35.0–60.0)
HGB (POC): 11.1 g/dL (ref 11–18)
LYMPHOCYTES (POC): 26 % (ref 20.5–51.1)
MCH (POC): 25.4 pg — AB (ref 27.0–31.0)
MCHC (POC): 31.3 g/dL — AB (ref 33.0–37.0)
MCV (POC): 81.6 fL (ref 80.0–99.9)
MONOCYTES (POC): 4.1 % (ref 1.7–9.3)
MPV (POC): 6.4 fL — AB (ref 7.8–11)
PLATELET (POC): 258 10*3/uL (ref 150–450)
RBC (POC): 4.39 M/uL (ref 4.00–6.00)
RDW (POC): 16.8 % — AB (ref 11.6–13.7)
WBC (POC): 10.5 10*3/uL (ref 4.5–10.5)

## 2015-09-13 NOTE — Progress Notes (Signed)
Kelsey [name] is a 64 y.o. female and presents with   Chief Complaint   Patient presents with   ??? Medication Reaction     feeling off balance, talking into cups,  just not feeing right   .  Pt still coughing,  Took delsym and continued other meds- including neurontin and chantix. and went to a meeting, while there felt extremely dizzy, had trouble with her balance and  Just did not feel right.  No lateralizing weakness,        Current Outpatient Prescriptions   Medication Sig Dispense Refill   ??? benzonatate (TESSALON) 200 mg capsule Take 1 Cap by mouth three (3) times daily as needed for Cough for up to 7 days. 60 Cap 1   ??? cyanocobalamin 1,000 mcg tablet Take 1,000 mcg by mouth daily.     ??? MAGNESIUM CARBONATE by Does Not Apply route.     ??? gabapentin (NEURONTIN) 300 mg capsule TAKE 3 CAPSULES BY MOUTH 3 TIMES A DAY 810 Cap 0   ??? CHANTIX CONTINUING MONTH BOX 1 mg tablet TAKE 1 TABLET BY MOUTH TWICE A DAY 56 Tab 0   ??? pravastatin (PRAVACHOL) 40 mg tablet Take 1 Tab by mouth nightly. 90 Tab 4   ??? solifenacin (VESICARE) 10 mg tablet Take 1 Tab by mouth daily. 90 Tab 4   ??? metFORMIN ER (GLUCOPHAGE XR) 500 mg tablet Take 2 tablets by mouth in the morning with Breakfast and 3 tablets by mouth at bedtime 450 Tab 4   ??? DULoxetine (CYMBALTA) 60 mg capsule TAKE 1 CAPSULE BY MOUTH DAILY 90 Cap 4   ??? insulin detemir (LEVEMIR FLEXTOUCH) 100 unit/mL (3 mL) inpn INJECT 34 UNITS AT BEDTIME DX: E11.65  90 day supply (Patient taking differently: 36 Units nightly. INJECT 34 UNITS AT BEDTIME DX: E11.65  90 day supply) 15 mL 99   ??? ezetimibe (ZETIA) 10 mg tablet Take 1 Tab by mouth nightly. 90 Tab 4   ??? insulin lispro (HUMALOG) 100 unit/mL kwikpen Inject 2-10 units three times daily per sliding scale  DX: 11.65  90 day supply 1 Package 11   ??? traMADol (ULTRAM) 50 mg tablet Take 2 Tabs by mouth every six (6) hours as needed for Pain. 200 Tab 1   ??? multivitamin (ONE A DAY) tablet Take 1 Tab by mouth daily.      ??? aspirin (ASPIRIN) 325 mg tablet Take 325 mg by mouth daily.     ??? carvedilol (COREG) 6.25 mg tablet Take 6.25 mg by mouth daily.     ??? bumetanide (BUMEX) 2 mg tablet Take 2 mg by mouth daily.     ??? methocarbamol (ROBAXIN) 500 mg tablet TAKE 2 TABLETS 4 TIMES A DAY 100 Tab 0   ??? Cholecalciferol, Vitamin D3, (VITAMIN D3) 1,000 unit Cap Take 5,000 Units by mouth daily.       Allergies   Allergen Reactions   ??? Morphine Anaphylaxis     Tolerates oxycodone without problem   ??? Avalide [Irbesartan-Hydrochlorothiazide] Myalgia   ??? Demerol [Meperidine] Other (comments)     Makes her feel crazy   ??? Pcn [Penicillins] Swelling   ??? Statins-Hmg-Coa Reductase Inhibitors Myalgia   ??? Sulfa (Sulfonamide Antibiotics) Unknown (comments)   ??? Tricor [Fenofibrate Micronized] Myalgia     Past Medical History   Diagnosis Date   ??? Arthritis    ??? CAD (coronary artery disease)    ??? Cervical spondylolysis    ??? Chronic pain    ???  COPD    ??? Diabetes ([place])    ??? GERD (gastroesophageal reflux disease)    ??? Goiter 04/20/2012   ??? Hypertension    ??? Neuropathy 04/06/2013     Past Surgical History   Procedure Laterality Date   ??? Pr cardiac surg procedure unlist       PTCA   ??? 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 Hx knee arthroscopy Right      X2   ??? Hx other surgical  1970     GANGLION CYST   ??? Hx tonsillectomy  1963     Family History   Problem Relation Age of Onset   ??? Lung Disease Mother    ??? Stroke Father    ??? Lung Disease Brother      COPD   ??? Hypertension Brother    ??? Cancer Brother      PROSTATE   ??? Heart Disease Brother    ??? Diabetes Brother    ??? Anesth Problems Neg Hx      Social History   Substance Use Topics   ??? Smoking status: Current Every Day Smoker     Packs/day: 0.30     Years: 45.00     Types: Cigarettes   ??? Smokeless tobacco: Never Used      Comment: passed ready   ??? Alcohol use Yes      Comment: may be 3 times a year if that          Objective:  Visit Vitals   ??? BP 120/70   ??? Pulse 75   ??? Temp 97.6 ??F (36.4 ??C)    ??? Ht '5\' 5"'  (1.651 m)   ??? Wt 205 lb (93 kg)   ??? SpO2 95%   ??? BMI 34.11 kg/m2     wdwn 64 yo wf  In NAD. A&O.- sl slurred speech. No trouble word finding.  HEENT -- Pupils round.  O/P Clear.  Neck -- Supple. No JVD.  Heart -- RRR. No R/M/G.  Lungs -- CTA.  Abdomen -- Soft. Non-tender. Non-distended. No masses. Bowel sounds present.  Extremities -- No edema.+1 pulses  Neuro- cn 2-12 grossly intact,  No weakness.  Balance and fine motor function impaired.        Results for orders placed or performed in visit on 53/66/44   METABOLIC PANEL, COMPREHENSIVE   Result Value Ref Range    Glucose 120 (H) 65 - 99 mg/dL    BUN 13 8 - 27 mg/dL    Creatinine 0.75 0.57 - 1.00 mg/dL    GFR est non-AA 85 >59 mL/min/1.73    GFR est AA 97 >59 mL/min/1.73    BUN/Creatinine ratio 17 11 - 26    Sodium 135 134 - 144 mmol/L    Potassium 3.9 3.5 - 5.2 mmol/L    Chloride 92 (L) 97 - 108 mmol/L    CO2 26 18 - 29 mmol/L    Calcium 9.1 8.7 - 10.3 mg/dL    Protein, total 6.2 6.0 - 8.5 g/dL    Albumin 4.1 3.6 - 4.8 g/dL    GLOBULIN, TOTAL 2.1 1.5 - 4.5 g/dL    A-G Ratio 2.0 1.1 - 2.5    Bilirubin, total 0.2 0.0 - 1.2 mg/dL    Alk. phosphatase 78 39 - 117 IU/L    AST 15 0 - 40 IU/L    ALT 14 0 - 32 IU/L   AMB POC COMPLETE  CBC,AUTOMATED ENTER   Result Value Ref Range    WBC (POC) 10.5 4.5 - 10.5 K/uL    LYMPHOCYTES (POC) 26.0 20.5 - 51.1 %    MONOCYTES (POC) 4.1 1.7 - 9.3 %    GRANULOCYTES (POC) 69.9 42.2 - 75.2 %    ABS. LYMPHS (POC) 2.7 1.2 - 3.4 K/uL    ABS. MONOS (POC) 0.4 0.1 - 0.6 10^3/ul    ABS. GRANS (POC) 7.3 (A) 1.4 - 6.5 10^3/ul    RBC (POC) 4.39 4.00 - 6.00 M/uL    HGB (POC) 11.1 11 - 18 g/dL    HCT (POC) 35.6 35.0 - 60.0 %    MCV (POC) 81.6 80.0 - 99.9 fL    MCH (POC) 25.4 (A) 27.0 - 31.0 pg    MCHC (POC) 31.3 (A) 33.0 - 37.0 g/dL    RDW (POC) 16.8 (A) 11.6 - 13.7 %    PLATELET (POC) 258 150 - 450 K/uL    MPV (POC) 6.4 (A) 7.8 - 11 fL         Assessment/Plan:    ICD-10-CM ICD-9-CM     1. Confusion R41.0 298.9 AMB POC COMPLETE CBC,AUTOMATED ENTER      MRI BRAIN WO CONT   2. Type II diabetes mellitus, uncontrolled (HCC) E11.65 250.02 PR COLLECTION VENOUS BLOOD,VENIPUNCTURE      METABOLIC PANEL, COMPREHENSIVE   3. 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

## 2015-09-14 ENCOUNTER — Encounter

## 2015-09-14 LAB — METABOLIC PANEL, COMPREHENSIVE
A-G Ratio: 2 (ref 1.1–2.5)
ALT (SGPT): 14 IU/L (ref 0–32)
AST (SGOT): 15 IU/L (ref 0–40)
Albumin: 4.1 g/dL (ref 3.6–4.8)
Alk. phosphatase: 78 IU/L (ref 39–117)
BUN/Creatinine ratio: 17 (ref 11–26)
BUN: 13 mg/dL (ref 8–27)
Bilirubin, total: 0.2 mg/dL (ref 0.0–1.2)
CO2: 26 mmol/L (ref 18–29)
Calcium: 9.1 mg/dL (ref 8.7–10.3)
Chloride: 92 mmol/L — ABNORMAL LOW (ref 97–108)
Creatinine: 0.75 mg/dL (ref 0.57–1.00)
GFR est AA: 97 mL/min/{1.73_m2} (ref >59)
GFR est non-AA: 85 mL/min/{1.73_m2} (ref >59)
GLOBULIN, TOTAL: 2.1 g/dL (ref 1.5–4.5)
Glucose: 120 mg/dL — ABNORMAL HIGH (ref 65–99)
Potassium: 3.9 mmol/L (ref 3.5–5.2)
Protein, total: 6.2 g/dL (ref 6.0–8.5)
Sodium: 135 mmol/L (ref 134–144)

## 2015-09-14 MED ORDER — BENZONATATE 200 MG CAP
200 mg | ORAL_CAPSULE | Freq: Three times a day (TID) | ORAL | 1 refills | Status: AC | PRN
Start: 2015-09-14 — End: 2015-09-21

## 2015-09-17 NOTE — Progress Notes (Signed)
pls call- kidney and liver function fine, how are you feeling?

## 2015-09-17 NOTE — Progress Notes (Signed)
Called patient and relayed doctor's message,

## 2015-09-18 ENCOUNTER — Inpatient Hospital Stay: Admit: 2015-09-18 | Payer: BLUE CROSS/BLUE SHIELD | Attending: Internal Medicine | Primary: Internal Medicine

## 2015-09-18 ENCOUNTER — Ambulatory Visit
Admit: 2015-09-18 | Discharge: 2015-09-18 | Payer: PRIVATE HEALTH INSURANCE | Attending: Internal Medicine | Primary: Internal Medicine

## 2015-09-18 DIAGNOSIS — R059 Cough, unspecified: Secondary | ICD-10-CM

## 2015-09-18 DIAGNOSIS — R05 Cough: Secondary | ICD-10-CM

## 2015-09-18 LAB — AMB POC COMPLETE CBC,AUTOMATED ENTER
ABS. GRANS (POC): 7.6 10*3/uL — AB (ref 1.4–6.5)
ABS. LYMPHS (POC): 2.6 10*3/uL (ref 1.2–3.4)
ABS. MONOS (POC): 0.3 10*3/uL (ref 0.1–0.6)
GRANULOCYTES (POC): 72.5 % (ref 42.2–75.2)
HCT (POC): 34.6 % — AB (ref 35.0–60.0)
HGB (POC): 11 g/dL (ref 11–18)
LYMPHOCYTES (POC): 24.8 % (ref 20.5–51.1)
MCH (POC): 25.8 pg — AB (ref 27.0–31.0)
MCHC (POC): 31.8 g/dL — AB (ref 33.0–37.0)
MCV (POC): 81.1 fL (ref 80.0–99.9)
MONOCYTES (POC): 2.7 % (ref 1.7–9.3)
MPV (POC): 6 fL — AB (ref 7.8–11)
PLATELET (POC): 310 10*3/uL (ref 150–450)
RBC (POC): 4.26 M/uL (ref 4.00–6.00)
RDW (POC): 16.7 % — AB (ref 11.6–13.7)
WBC (POC): 10.5 10*3/uL (ref 4.5–10.5)

## 2015-09-18 MED ORDER — METHYLPREDNISOLONE 4 MG TABS IN A DOSE PACK
4 mg | ORAL | 0 refills | Status: DC
Start: 2015-09-18 — End: 2015-10-10

## 2015-09-18 MED ORDER — LEVOFLOXACIN 500 MG TAB
500 mg | ORAL_TABLET | Freq: Every day | ORAL | 0 refills | Status: DC
Start: 2015-09-18 — End: 2015-09-26

## 2015-09-18 NOTE — Patient Instructions (Signed)
Results for orders placed or performed in visit on 09/18/15   AMB POC COMPLETE CBC,AUTOMATED ENTER   Result Value Ref Range    WBC (POC) 10.5 4.5 - 10.5 K/uL    LYMPHOCYTES (POC) 24.8 20.5 - 51.1 %    MONOCYTES (POC) 2.7 1.7 - 9.3 %    GRANULOCYTES (POC) 72.5 42.2 - 75.2 %    ABS. LYMPHS (POC) 2.6 1.2 - 3.4 K/uL    ABS. MONOS (POC) 0.3 0.1 - 0.6 10^3/ul    ABS. GRANS (POC) 7.6 (A) 1.4 - 6.5 10^3/ul    RBC (POC) 4.26 4.00 - 6.00 M/uL    HGB (POC) 11.0 11 - 18 g/dL    HCT (POC) 16.1 (A) 09.6 - 60.0 %    MCV (POC) 81.1 80.0 - 99.9 fL    MCH (POC) 25.8 (A) 27.0 - 31.0 pg    MCHC (POC) 31.8 (A) 33.0 - 37.0 g/dL    RDW (POC) 04.5 (A) 40.9 - 13.7 %    PLATELET (POC) 310 150 - 450 K/uL    MPV (POC) 6.0 (A) 7.8 - 11 fL     duonebs 4 times a day

## 2015-09-18 NOTE — Progress Notes (Signed)
Kelsey [name] is a 64 y.o. female and presents with   Chief Complaint   Patient presents with   ??? Follow-up     URI not any better   .  Cough is driving her crazy.  Used nebulizer twice yesterday.  Confusion and dizziness have improved..  Has soreness in chest from coughing.  Initially zpack helped, she felt better, then worse again, cough productive tannish phlegm/  Feels badly.  No sob.  Not checking blood sugars      Current Outpatient Prescriptions   Medication Sig Dispense Refill   ??? levofloxacin (LEVAQUIN) 500 mg tablet Take 1 Tab by mouth daily. 7 Tab 0   ??? methylPREDNISolone (MEDROL, PAK,) 4 mg tablet As directed 1 Dose Pack 0   ??? benzonatate (TESSALON) 200 mg capsule Take 1 Cap by mouth three (3) times daily as needed for Cough for up to 7 days. 60 Cap 1   ??? cyanocobalamin 1,000 mcg tablet Take 1,000 mcg by mouth daily.     ??? MAGNESIUM CARBONATE by Does Not Apply route.     ??? gabapentin (NEURONTIN) 300 mg capsule TAKE 3 CAPSULES BY MOUTH 3 TIMES A DAY 810 Cap 0   ??? CHANTIX CONTINUING MONTH BOX 1 mg tablet TAKE 1 TABLET BY MOUTH TWICE A DAY 56 Tab 0   ??? pravastatin (PRAVACHOL) 40 mg tablet Take 1 Tab by mouth nightly. 90 Tab 4   ??? solifenacin (VESICARE) 10 mg tablet Take 1 Tab by mouth daily. 90 Tab 4   ??? metFORMIN ER (GLUCOPHAGE XR) 500 mg tablet Take 2 tablets by mouth in the morning with Breakfast and 3 tablets by mouth at bedtime 450 Tab 4   ??? DULoxetine (CYMBALTA) 60 mg capsule TAKE 1 CAPSULE BY MOUTH DAILY 90 Cap 4   ??? insulin detemir (LEVEMIR FLEXTOUCH) 100 unit/mL (3 mL) inpn INJECT 34 UNITS AT BEDTIME DX: E11.65  90 day supply (Patient taking differently: 36 Units nightly. INJECT 34 UNITS AT BEDTIME DX: E11.65  90 day supply) 15 mL 99   ??? ezetimibe (ZETIA) 10 mg tablet Take 1 Tab by mouth nightly. 90 Tab 4   ??? insulin lispro (HUMALOG) 100 unit/mL kwikpen Inject 2-10 units three times daily per sliding scale  DX: 11.65  90 day supply 1 Package 11    ??? traMADol (ULTRAM) 50 mg tablet Take 2 Tabs by mouth every six (6) hours as needed for Pain. 200 Tab 1   ??? multivitamin (ONE A DAY) tablet Take 1 Tab by mouth daily.     ??? aspirin (ASPIRIN) 325 mg tablet Take 325 mg by mouth daily.     ??? carvedilol (COREG) 6.25 mg tablet Take 6.25 mg by mouth daily.     ??? bumetanide (BUMEX) 2 mg tablet Take 2 mg by mouth daily.     ??? methocarbamol (ROBAXIN) 500 mg tablet TAKE 2 TABLETS 4 TIMES A DAY 100 Tab 0   ??? Cholecalciferol, Vitamin D3, (VITAMIN D3) 1,000 unit Cap Take 5,000 Units by mouth daily.       Allergies   Allergen Reactions   ??? Morphine Anaphylaxis     Tolerates oxycodone without problem   ??? Avalide [Irbesartan-Hydrochlorothiazide] Myalgia   ??? Demerol [Meperidine] Other (comments)     Makes her feel crazy   ??? Pcn [Penicillins] Swelling   ??? Statins-Hmg-Coa Reductase Inhibitors Myalgia   ??? Sulfa (Sulfonamide Antibiotics) Unknown (comments)   ??? Tricor [Fenofibrate Micronized] Myalgia     Past Medical History  Diagnosis Date   ??? Arthritis    ??? CAD (coronary artery disease)    ??? Cervical spondylolysis    ??? Chronic pain    ??? COPD    ??? Diabetes (HCC)    ??? GERD (gastroesophageal reflux disease)    ??? Goiter 04/20/2012   ??? Hypertension    ??? Neuropathy 04/06/2013     Past Surgical History   Procedure Laterality Date   ??? Pr cardiac surg procedure unlist       PTCA   ??? 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 Hx knee arthroscopy Right      X2   ??? Hx other surgical  1970     GANGLION CYST   ??? Hx tonsillectomy  1963     Family History   Problem Relation Age of Onset   ??? Lung Disease Mother    ??? Stroke Father    ??? Lung Disease Brother      COPD   ??? Hypertension Brother    ??? Cancer Brother      PROSTATE   ??? Heart Disease Brother    ??? Diabetes Brother    ??? Anesth Problems Neg Hx      Social History   Substance Use Topics   ??? Smoking status: Current Every Day Smoker     Packs/day: 0.30     Years: 45.00     Types: Cigarettes   ??? Smokeless tobacco: Never Used       Comment: passed ready   ??? Alcohol use Yes      Comment: may be 3 times a year if that          Objective:  Visit Vitals   ??? BP 150/70   ??? Pulse 79   ??? Temp 99.7 ??F (37.6 ??C)   ??? Ht  (1.651 m)   ??? Wt 205 lb (93 kg)   ??? SpO2 94%   ??? BMI 34.11 kg/m2     wdwn 64 yo wf  In NAD. A&O.  HEENT -- Pupils round.  O/P Clear. Tm- dull  Neck -- Supple. No JVD.no acn, no supraclavicular nodes  Heart -- RRR. No R/M/G.  Lungs -- Coarse breath sounds right mid lung  Abdomen -- Soft. Non-tender. Non-distended. No masses. Bowel sounds present.  Extremities -- No edema.      Results for orders placed or performed in visit on 09/18/15   AMB POC COMPLETE CBC,AUTOMATED ENTER   Result Value Ref Range    WBC (POC) 10.5 4.5 - 10.5 K/uL    LYMPHOCYTES (POC) 24.8 20.5 - 51.1 %    MONOCYTES (POC) 2.7 1.7 - 9.3 %    GRANULOCYTES (POC) 72.5 42.2 - 75.2 %    ABS. LYMPHS (POC) 2.6 1.2 - 3.4 K/uL    ABS. MONOS (POC) 0.3 0.1 - 0.6 10^3/ul    ABS. GRANS (POC) 7.6 (A) 1.4 - 6.5 10^3/ul    RBC (POC) 4.26 4.00 - 6.00 M/uL    HGB (POC) 11.0 11 - 18 g/dL    HCT (POC) 16.1 (A) 09.6 - 60.0 %    MCV (POC) 81.1 80.0 - 99.9 fL    MCH (POC) 25.8 (A) 27.0 - 31.0 pg    MCHC (POC) 31.8 (A) 33.0 - 37.0 g/dL    RDW (POC) 04.5 (A) 40.9 - 13.7 %    PLATELET (POC) 310 150 - 450 K/uL    MPV (POC) 6.0 (A)  7.8 - 11 fL         Assessment/Plan:    ICD-10-CM ICD-9-CM    1. Cough R05 786.2 XR CHEST PA LAT      AMB POC COMPLETE CBC,AUTOMATED ENTER      levofloxacin (LEVAQUIN) 500 mg tablet      methylPREDNISolone (MEDROL, PAK,) 4 mg tablet     Continue mucinex.  Discussed relative in adult home      Author:  Lake Bells, MD 09/18/2015 2:53 PM

## 2015-09-19 ENCOUNTER — Ambulatory Visit
Admit: 2015-09-19 | Discharge: 2015-09-19 | Payer: PRIVATE HEALTH INSURANCE | Attending: Internal Medicine | Primary: Internal Medicine

## 2015-09-19 NOTE — Progress Notes (Signed)
Error

## 2015-09-19 NOTE — Progress Notes (Signed)
pls call- radiologist would like a repeat chest xray one month due to an area that is probably overlapping shadows, but needs to be rechecked. For now continue levaquin and nebs.

## 2015-09-19 NOTE — Progress Notes (Signed)
Called patient and relayed doctor's message, she will be in this morning at 11:30 am

## 2015-09-19 NOTE — Progress Notes (Signed)
error 

## 2015-09-26 ENCOUNTER — Ambulatory Visit
Admit: 2015-09-26 | Discharge: 2015-09-26 | Payer: PRIVATE HEALTH INSURANCE | Attending: Internal Medicine | Primary: Internal Medicine

## 2015-09-26 ENCOUNTER — Inpatient Hospital Stay: Admit: 2015-09-26 | Payer: BLUE CROSS/BLUE SHIELD | Attending: Internal Medicine | Primary: Internal Medicine

## 2015-09-26 DIAGNOSIS — R0609 Other forms of dyspnea: Secondary | ICD-10-CM

## 2015-09-26 LAB — AMB POC COMPLETE CBC,AUTOMATED ENTER
ABS. GRANS (POC): 11.9 10*3/uL — AB (ref 1.4–6.5)
ABS. LYMPHS (POC): 4.6 10*3/uL — AB (ref 1.2–3.4)
ABS. MONOS (POC): 1 10*3/uL — AB (ref 0.1–0.6)
GRANULOCYTES (POC): 68.2 % (ref 42.2–75.2)
HCT (POC): 39.6 % (ref 35.0–60.0)
HGB (POC): 12.7 g/dL (ref 11–18)
LYMPHOCYTES (POC): 26.3 % (ref 20.5–51.1)
MCH (POC): 25.7 pg — AB (ref 27.0–31.0)
MCHC (POC): 32.1 g/dL — AB (ref 33.0–37.0)
MCV (POC): 80 fL (ref 80.0–99.9)
MONOCYTES (POC): 5.5 % (ref 1.7–9.3)
MPV (POC): 7.8 fL (ref 7.8–11)
PLATELET (POC): 357 10*3/uL (ref 150–450)
RBC (POC): 4.94 M/uL (ref 4.00–6.00)
RDW (POC): 16.5 % — AB (ref 11.6–13.7)
WBC (POC): 17.5 10*3/uL — AB (ref 4.5–10.5)

## 2015-09-26 MED ORDER — LEVOFLOXACIN 750 MG TAB
750 mg | ORAL_TABLET | Freq: Every day | ORAL | 0 refills | Status: DC
Start: 2015-09-26 — End: 2015-10-10

## 2015-09-26 NOTE — Patient Instructions (Signed)
Results for orders placed or performed in visit on 09/26/15   AMB POC COMPLETE CBC,AUTOMATED ENTER   Result Value Ref Range    WBC (POC) 17.5 (A) 4.5 - 10.5 K/uL    LYMPHOCYTES (POC) 26.3 20.5 - 51.1 %    MONOCYTES (POC) 5.5 1.7 - 9.3 %    GRANULOCYTES (POC) 68.2 42.2 - 75.2 %    ABS. LYMPHS (POC) 4.6 (A) 1.2 - 3.4 K/uL    ABS. MONOS (POC) 1.0 (A) 0.1 - 0.6 10^3/ul    ABS. GRANS (POC) 11.9 (A) 1.4 - 6.5 10^3/ul    RBC (POC) 4.94 4.00 - 6.00 M/uL    HGB (POC) 12.7 11 - 18 g/dL    HCT (POC) 16.1 09.6 - 60.0 %    MCV (POC) 80.0 80.0 - 99.9 fL    MCH (POC) 25.7 (A) 27.0 - 31.0 pg    MCHC (POC) 32.1 (A) 33.0 - 37.0 g/dL    RDW (POC) 04.5 (A) 40.9 - 13.7 %    PLATELET (POC) 357 150 - 450 K/uL    MPV (POC) 7.8 7.8 - 11 fL     Mucinex, warm salt water gargles

## 2015-09-26 NOTE — Progress Notes (Signed)
Kelsey [name] is a 64 y.o. female and presents with   Chief Complaint   Patient presents with   ??? Hoarse   .started feeling better last Wed.  But woke up Friday am feeling like she had been run over by a truck.  Cough is better, but feels very short of breath and   Has terrible sinus headache. No chest pain,  Sl temp up to 100.        Current Outpatient Prescriptions   Medication Sig Dispense Refill   ??? levofloxacin (LEVAQUIN) 750 mg tablet Take 1 Tab by mouth daily. 5 Tab 0   ??? methylPREDNISolone (MEDROL, PAK,) 4 mg tablet As directed 1 Dose Pack 0   ??? cyanocobalamin 1,000 mcg tablet Take 1,000 mcg by mouth daily.     ??? MAGNESIUM CARBONATE by Does Not Apply route.     ??? gabapentin (NEURONTIN) 300 mg capsule TAKE 3 CAPSULES BY MOUTH 3 TIMES A DAY 810 Cap 0   ??? CHANTIX CONTINUING MONTH BOX 1 mg tablet TAKE 1 TABLET BY MOUTH TWICE A DAY 56 Tab 0   ??? pravastatin (PRAVACHOL) 40 mg tablet Take 1 Tab by mouth nightly. 90 Tab 4   ??? solifenacin (VESICARE) 10 mg tablet Take 1 Tab by mouth daily. 90 Tab 4   ??? metFORMIN ER (GLUCOPHAGE XR) 500 mg tablet Take 2 tablets by mouth in the morning with Breakfast and 3 tablets by mouth at bedtime 450 Tab 4   ??? DULoxetine (CYMBALTA) 60 mg capsule TAKE 1 CAPSULE BY MOUTH DAILY 90 Cap 4   ??? insulin detemir (LEVEMIR FLEXTOUCH) 100 unit/mL (3 mL) inpn INJECT 34 UNITS AT BEDTIME DX: E11.65  90 day supply (Patient taking differently: 36 Units nightly. INJECT 34 UNITS AT BEDTIME DX: E11.65  90 day supply) 15 mL 99   ??? ezetimibe (ZETIA) 10 mg tablet Take 1 Tab by mouth nightly. 90 Tab 4   ??? insulin lispro (HUMALOG) 100 unit/mL kwikpen Inject 2-10 units three times daily per sliding scale  DX: 11.65  90 day supply 1 Package 11   ??? traMADol (ULTRAM) 50 mg tablet Take 2 Tabs by mouth every six (6) hours as needed for Pain. 200 Tab 1   ??? multivitamin (ONE A DAY) tablet Take 1 Tab by mouth daily.     ??? aspirin (ASPIRIN) 325 mg tablet Take 325 mg by mouth daily.      ??? carvedilol (COREG) 6.25 mg tablet Take 6.25 mg by mouth daily.     ??? bumetanide (BUMEX) 2 mg tablet Take 2 mg by mouth daily.     ??? methocarbamol (ROBAXIN) 500 mg tablet TAKE 2 TABLETS 4 TIMES A DAY 100 Tab 0   ??? Cholecalciferol, Vitamin D3, (VITAMIN D3) 1,000 unit Cap Take 5,000 Units by mouth daily.       Allergies   Allergen Reactions   ??? Morphine Anaphylaxis     Tolerates oxycodone without problem   ??? Avalide [Irbesartan-Hydrochlorothiazide] Myalgia   ??? Demerol [Meperidine] Other (comments)     Makes her feel crazy   ??? Pcn [Penicillins] Swelling   ??? Statins-Hmg-Coa Reductase Inhibitors Myalgia   ??? Sulfa (Sulfonamide Antibiotics) Unknown (comments)   ??? Tricor [Fenofibrate Micronized] Myalgia     Past Medical History   Diagnosis Date   ??? Arthritis    ??? CAD (coronary artery disease)    ??? Cervical spondylolysis    ??? Chronic pain    ??? COPD    ??? Diabetes (  [facility])    ??? GERD (gastroesophageal reflux disease)    ??? Goiter 04/20/2012   ??? Hypertension    ??? Neuropathy 04/06/2013     Past Surgical History   Procedure Laterality Date   ??? Pr cardiac surg procedure unlist       PTCA   ??? 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 Hx knee arthroscopy Right      X2   ??? Hx other surgical  1970     GANGLION CYST   ??? Hx tonsillectomy  1963     Family History   Problem Relation Age of Onset   ??? Lung Disease Mother    ??? Stroke Father    ??? Lung Disease Brother      COPD   ??? Hypertension Brother    ??? Cancer Brother      PROSTATE   ??? Heart Disease Brother    ??? Diabetes Brother    ??? Anesth Problems Neg Hx      Social History   Substance Use Topics   ??? Smoking status: Current Every Day Smoker     Packs/day: 0.30     Years: 45.00     Types: Cigarettes   ??? Smokeless tobacco: Never Used      Comment: passed ready   ??? Alcohol use Yes      Comment: may be 3 times a year if that          Objective:  Visit Vitals   ??? BP 120/60   ??? Pulse 86   ??? Ht '5\' 5"'  (1.651 m)   ??? Wt 205 lb (93 kg)   ??? SpO2 97%   ??? BMI 34.11 kg/m2     wdwn 64 yo wf   In NAD. A&O.  HEENT -- Pupils round.  O/P Clear. Left tm- dull  Neck -- Supple. No JVD.no acn  Heart -- 2/6 asm  Lungs -- CTA.  Abdomen -- Soft. Non-tender. Non-distended. No masses. Bowel sounds present.  Extremities -- No edema.      Results for orders placed or performed in visit on 09/26/15   AMB POC COMPLETE CBC,AUTOMATED ENTER   Result Value Ref Range    WBC (POC) 17.5 (A) 4.5 - 10.5 K/uL    LYMPHOCYTES (POC) 26.3 20.5 - 51.1 %    MONOCYTES (POC) 5.5 1.7 - 9.3 %    GRANULOCYTES (POC) 68.2 42.2 - 75.2 %    ABS. LYMPHS (POC) 4.6 (A) 1.2 - 3.4 K/uL    ABS. MONOS (POC) 1.0 (A) 0.1 - 0.6 10^3/ul    ABS. GRANS (POC) 11.9 (A) 1.4 - 6.5 10^3/ul    RBC (POC) 4.94 4.00 - 6.00 M/uL    HGB (POC) 12.7 11 - 18 g/dL    HCT (POC) 39.6 35.0 - 60.0 %    MCV (POC) 80.0 80.0 - 99.9 fL    MCH (POC) 25.7 (A) 27.0 - 31.0 pg    MCHC (POC) 32.1 (A) 33.0 - 37.0 g/dL    RDW (POC) 16.5 (A) 11.6 - 13.7 %    PLATELET (POC) 357 150 - 450 K/uL    MPV (POC) 7.8 7.8 - 11 fL       Assessment/Plan:    ICD-10-CM ICD-9-CM    1. Dyspnea on exertion R06.09 786.09 XR CHEST PA LAT      AMB POC COMPLETE CBC,AUTOMATED ENTER      levofloxacin (LEVAQUIN) 750 mg tablet  2. Cough R05 786.2 levofloxacin (LEVAQUIN) 750 mg tablet     Head ct if not better, mucinex, warm salt water gargles      Author:  Genene Churn, MD 09/26/2015 3:07 PM

## 2015-09-27 NOTE — Telephone Encounter (Signed)
Pt wants to say she is feeling better  Pt ph # [phone]

## 2015-09-28 NOTE — Telephone Encounter (Signed)
Pls let her know we are glad she is better.  If cough is worse, increase nebulizer to every 3 hours

## 2015-09-28 NOTE — Telephone Encounter (Signed)
FYI

## 2015-09-28 NOTE — Telephone Encounter (Signed)
Called patient and relayed doctor's message

## 2015-10-09 ENCOUNTER — Ambulatory Visit: Admit: 2015-10-09 | Payer: PRIVATE HEALTH INSURANCE | Attending: Neurology | Primary: Internal Medicine

## 2015-10-09 DIAGNOSIS — R4189 Other symptoms and signs involving cognitive functions and awareness: Secondary | ICD-10-CM

## 2015-10-09 NOTE — Progress Notes (Signed)
Neurology Clinic Follow up Note    Patient ID:  Kelsey [name]  [id number]  64 y.o.  [birth year]      [name] is here for follow up today of memory loss.       Last Appointment With Me:  07/09/2015       Interval History:   She feels memory is improved from initial visit.  She is supplementing with B12 daily.   She did schedule neuropsych testing with Dr. Wynonia Hazard- appt 11/01/15.    Reporting some positional dizziness intermittently causing occasional imbalance.  Recently diagnosed with sinus infection.      PMHx/ PSHx/ FHx/ SHx:  Reviewed and unchanged previous visit.       ROS:  Comprehensive review of systems negative except for as noted above.       Objective:       Meds:  Current Outpatient Prescriptions   Medication Sig Dispense Refill   ??? cyanocobalamin 1,000 mcg tablet Take 1,000 mcg by mouth daily.     ??? MAGNESIUM CARBONATE by Does Not Apply route.     ??? CHANTIX CONTINUING MONTH BOX 1 mg tablet TAKE 1 TABLET BY MOUTH TWICE A DAY 56 Tab 0   ??? pravastatin (PRAVACHOL) 40 mg tablet Take 1 Tab by mouth nightly. 90 Tab 4   ??? solifenacin (VESICARE) 10 mg tablet Take 1 Tab by mouth daily. 90 Tab 4   ??? metFORMIN ER (GLUCOPHAGE XR) 500 mg tablet Take 2 tablets by mouth in the morning with Breakfast and 3 tablets by mouth at bedtime 450 Tab 4   ??? insulin detemir (LEVEMIR FLEXTOUCH) 100 unit/mL (3 mL) inpn INJECT 34 UNITS AT BEDTIME DX: E11.65  90 day supply (Patient taking differently: 36 Units nightly. INJECT 34 UNITS AT BEDTIME DX: E11.65  90 day supply) 15 mL 99   ??? insulin lispro (HUMALOG) 100 unit/mL kwikpen Inject 2-10 units three times daily per sliding scale  DX: 11.65  90 day supply 1 Package 11   ??? multivitamin (ONE A DAY) tablet Take 1 Tab by mouth daily.     ??? aspirin (ASPIRIN) 325 mg tablet Take 325 mg by mouth daily.     ??? carvedilol (COREG) 6.25 mg tablet Take 6.25 mg by mouth daily.     ??? bumetanide (BUMEX) 2 mg tablet Take 2 mg by mouth daily.      ??? methocarbamol (ROBAXIN) 500 mg tablet TAKE 2 TABLETS 4 TIMES A DAY 100 Tab 0   ??? Cholecalciferol, Vitamin D3, (VITAMIN D3) 1,000 unit Cap Take 5,000 Units by mouth daily.     ??? levofloxacin (LEVAQUIN) 750 mg tablet Take 1 Tab by mouth daily. 5 Tab 0   ??? methylPREDNISolone (MEDROL, PAK,) 4 mg tablet As directed 1 Dose Pack 0   ??? gabapentin (NEURONTIN) 300 mg capsule TAKE 3 CAPSULES BY MOUTH 3 TIMES A DAY 810 Cap 0   ??? DULoxetine (CYMBALTA) 60 mg capsule TAKE 1 CAPSULE BY MOUTH DAILY 90 Cap 4   ??? ezetimibe (ZETIA) 10 mg tablet Take 1 Tab by mouth nightly. 90 Tab 4   ??? traMADol (ULTRAM) 50 mg tablet Take 2 Tabs by mouth every six (6) hours as needed for Pain. 200 Tab 1       Exam:  There were no vitals taken for this visit.    LABS  Component      Latest Ref Rng 09/07/2015 07/09/2015 07/09/2015 07/09/2015           2:38  PM  9:46 AM  9:46 AM  9:46 AM   TSH      0.450 - 4.500 uIU/mL 2.170   2.180   T4, Free      0.82 - 1.77 ng/dL   [phone]    METHYLMALONIC ACID, SERUM      0 - 378 nmol/L  162         IMAGING:  MRI Results (most recent):    Results from Hospital Encounter encounter on 08/07/15   MRI BRAIN WO CONT   Narrative **Final Report**      ICD Codes / Adm.Diagnosis: 780.93  R41.3 / Memory loss  Other amnesia  Examination:  MR BRAIN WO CON  - [id number] - Aug 07 2015  1:37PM  Accession No:  [id number]  Reason:  memory impairment x 3 months      REPORT:  EXAM:  MR BRAIN WO CON  INDICATION:  memory impairment x 3 months, R 41.3, balance issues, dizziness  TECHNIQUE: Sagittal T1, axial FLAIR, T2,T1 and gradient echo images as well   as coronal T2 weighted images and axial diffusion weighted images of the   head were obtained. Mild motion artifact. Some sequences were repeated or   adjusted to accommodate this.  COMPARISON:  CT head 06/06/15  FINDINGS:  The ventricular size and configuration are within normal limits.   A few small foci of T2 hyperintensity in the cerebral white matter without    associated restricted diffusion. This is nonspecific and likely chronic.  No evidence of hemorrhage, mass, acute infarction or abnormal extra-axial   fluid collections.   Flow voids are present in the vertebral basilar and carotid artery systems.   Left vertebral artery is hypoplastic.  The craniocervical junction is normal.   Retention cyst floor of the right maxillary sinus, otherwise structures of   the skull base are unremarkable.       IMPRESSION: No acute findings demonstrated.           Signing/Reading Doctor: Solon AugustaOBERT R. BESKIN [phone])    Approved: Solon AugustaROBERT R. BESKIN ([id number])  Aug 07 2015  2:19PM                                       Assessment:     Encounter Diagnoses     ICD-10-CM ICD-9-CM   1. Subjective memory complaints R41.89 780.93   2. B12 deficiency E53.8 266.2   3. Depression, unspecified depression type F32.9 39311      64 yo RHF presenting with cognitive impairment x 3 months, non-progressive.  MOCA borderline normal 26/30 (-1 language, -3 delayed recall).   Cognitive symptoms likely related to underlying B12 deficiency, although comorbid depression may also be contributing.  She would benefit from detailed Neuropsychologic testing.   Cognition improved from initial visit with continued B12 supplementation.    Reporting more recent dizziness provoked by movement/positional change which sounds peripheral and possibly exacerbated by recent sinusitis.  If persistent, may consider ENT eval.    Plan:   Neuropsychologic testing  Cont. B12 supplementation  Heart healthy diet and exercise  Regular scheduled cognitive and social engagement.    Follow-up Disposition: F/U to be arranged after neuropsychologic testing completed    Signed:  Hulan SaasRachel M Wetzel Meester, DO  10/09/2015

## 2015-10-09 NOTE — Patient Instructions (Signed)
A Healthy Lifestyle: Care Instructions  Your Care Instructions  A healthy lifestyle can help you feel good, stay at a healthy weight, and have plenty of energy for both work and play. A healthy lifestyle is something you can share with your whole family.  A healthy lifestyle also can lower your risk for serious health problems, such as high blood pressure, heart disease, and diabetes.  You can follow a few steps listed below to improve your health and the health of your family.  Follow-up care is a key part of your treatment and safety. Be sure to make and go to all appointments, and call your doctor if you are having problems. It???s also a good idea to know your test results and keep a list of the medicines you take.  How can you care for yourself at home?  ?? Do not eat too much sugar, fat, or fast foods. You can still have dessert and treats now and then. The goal is moderation.  ?? Start small to improve your eating habits. Pay attention to portion sizes, drink less juice and soda pop, and eat more fruits and vegetables.  ?? Eat a healthy amount of food. A 3-ounce serving of meat, for example, is about the size of a deck of cards. Fill the rest of your plate with vegetables and whole grains.  ?? Limit the amount of soda and sports drinks you have every day. Drink more water when you are thirsty.  ?? Eat at least 5 servings of fruits and vegetables every day. It may seem like a lot, but it is not hard to reach this goal. A serving or helping is 1 piece of fruit, 1 cup of vegetables, or 2 cups of leafy, raw vegetables. Have an apple or some carrot sticks as an afternoon snack instead of a candy bar. Try to have fruits and/or vegetables at every meal.  ?? Make exercise part of your daily routine. You may want to start with simple activities, such as walking, bicycling, or slow swimming. Try to be active 30 to 60 minutes every day. You do not need to do all 30 to 60  minutes all at once. For example, you can exercise 3 times a day for 10 or 20 minutes. Moderate exercise is safe for most people, but it is always a good idea to talk to your doctor before starting an exercise program.  ?? Keep moving. Mow the lawn, work in the garden, or clean your house. Take the stairs instead of the elevator at work.  ?? If you smoke, quit. People who smoke have an increased risk for heart attack, stroke, cancer, and other lung illnesses. Quitting is hard, but there are ways to boost your chance of quitting tobacco for good.  ?? Use nicotine gum, patches, or lozenges.  ?? Ask your doctor about stop-smoking programs and medicines.  ?? Keep trying.  In addition to reducing your risk of diseases in the future, you will notice some benefits soon after you stop using tobacco. If you have shortness of breath or asthma symptoms, they will likely get better within a few weeks after you quit.  ?? Limit how much alcohol you drink. Moderate amounts of alcohol (up to 2 drinks a day for men, 1 drink a day for women) are okay. But drinking too much can lead to liver problems, high blood pressure, and other health problems.  Family health  If you have a family, there are many things you can do   together to improve your health.  ?? Eat meals together as a family as often as possible.  ?? Eat healthy foods. This includes fruits, vegetables, lean meats and dairy, and whole grains.  ?? Include your family in your fitness plan. Most people think of activities such as jogging or tennis as the way to fitness, but there are many ways you and your family can be more active. Anything that makes you breathe hard and gets your heart pumping is exercise. Here are some tips:  ?? Walk to do errands or to take your child to school or the bus.  ?? Go for a family bike ride after dinner instead of watching TV.  Where can you learn more?  Go to http://www.healthwise.net/[employer]   Enter U807 in the search box to learn more about "A Healthy Lifestyle: Care Instructions."  ?? 2006-2016 [employer]. Care instructions adapted under license by [employer] (which disclaims liability or warranty for this information). This care instruction is for use with your licensed healthcare professional. If you have questions about a medical condition or this instruction, always ask your healthcare professional. Healthwise, Incorporated disclaims any warranty or liability for your use of this information.  Content Version: 11.0.578772; Current as of: November 10, 2014

## 2015-10-09 NOTE — Progress Notes (Signed)
Memory impairment improved   C/o dizzy spells

## 2015-10-10 ENCOUNTER — Ambulatory Visit: Admit: 2015-10-10 | Payer: PRIVATE HEALTH INSURANCE | Attending: Internal Medicine | Primary: Internal Medicine

## 2015-10-10 DIAGNOSIS — R42 Dizziness and giddiness: Secondary | ICD-10-CM

## 2015-10-10 LAB — AMB POC URINALYSIS DIP STICK AUTO W/ MICRO
Bilirubin (UA POC): NEGATIVE
Blood (UA POC): NEGATIVE
Glucose (UA POC): NEGATIVE
Ketones (UA POC): NEGATIVE
Nitrites (UA POC): NEGATIVE
Protein (UA POC): NEGATIVE mg/dL
Specific gravity (UA POC): 1.005 (ref 1.001–1.035)
Urobilinogen (UA POC): 0.2 (ref 0.2–1)
pH (UA POC): 5.5 (ref 4.6–8.0)

## 2015-10-10 NOTE — Progress Notes (Signed)
[name] is a 64 y.o. female and presents with   Chief Complaint   Patient presents with   ??? Follow-up   .  Patient saw neurologist yesterday.  Still having problems breathing, but still dizzy.  And sinuses are 'giving me a fit"  Has a lot of sinus headaches". My world tilts" , does not spin.  Worse when changing positions or bringing head up.  Eyes due to be checked in November. Not checking blood sugars. She thinks she may have a uti      Current Outpatient Prescriptions   Medication Sig Dispense Refill   ??? cyanocobalamin 1,000 mcg tablet Take 1,000 mcg by mouth daily.     ??? MAGNESIUM CARBONATE by Does Not Apply route.     ??? gabapentin (NEURONTIN) 300 mg capsule TAKE 3 CAPSULES BY MOUTH 3 TIMES A DAY (Patient taking differently: take 2 capsules three times a day) 810 Cap 0   ??? CHANTIX CONTINUING MONTH BOX 1 mg tablet TAKE 1 TABLET BY MOUTH TWICE A DAY 56 Tab 0   ??? pravastatin (PRAVACHOL) 40 mg tablet Take 1 Tab by mouth nightly. 90 Tab 4   ??? solifenacin (VESICARE) 10 mg tablet Take 1 Tab by mouth daily. 90 Tab 4   ??? metFORMIN ER (GLUCOPHAGE XR) 500 mg tablet Take 2 tablets by mouth in the morning with Breakfast and 3 tablets by mouth at bedtime 450 Tab 4   ??? DULoxetine (CYMBALTA) 60 mg capsule TAKE 1 CAPSULE BY MOUTH DAILY 90 Cap 4   ??? insulin detemir (LEVEMIR FLEXTOUCH) 100 unit/mL (3 mL) inpn INJECT 34 UNITS AT BEDTIME DX: E11.65  90 day supply (Patient taking differently: 36 Units nightly. INJECT 34 UNITS AT BEDTIME DX: E11.65  90 day supply) 15 mL 99   ??? ezetimibe (ZETIA) 10 mg tablet Take 1 Tab by mouth nightly. 90 Tab 4   ??? insulin lispro (HUMALOG) 100 unit/mL kwikpen Inject 2-10 units three times daily per sliding scale  DX: 11.65  90 day supply 1 Package 11   ??? traMADol (ULTRAM) 50 mg tablet Take 2 Tabs by mouth every six (6) hours as needed for Pain. 200 Tab 1   ??? multivitamin (ONE A DAY) tablet Take 1 Tab by mouth daily.     ??? aspirin (ASPIRIN) 325 mg tablet Take 325 mg by mouth daily.      ??? carvedilol (COREG) 6.25 mg tablet Take 6.25 mg by mouth daily.     ??? bumetanide (BUMEX) 2 mg tablet Take 2 mg by mouth daily.     ??? Cholecalciferol, Vitamin D3, (VITAMIN D3) 1,000 unit Cap Take 5,000 Units by mouth daily.       Allergies   Allergen Reactions   ??? Morphine Anaphylaxis     Tolerates oxycodone without problem   ??? Avalide [Irbesartan-Hydrochlorothiazide] Myalgia   ??? Demerol [Meperidine] Other (comments)     Makes her feel crazy   ??? Pcn [Penicillins] Swelling   ??? Statins-Hmg-Coa Reductase Inhibitors Myalgia   ??? Sulfa (Sulfonamide Antibiotics) Unknown (comments)   ??? Tricor [Fenofibrate Micronized] Myalgia     Past Medical History   Diagnosis Date   ??? Arthritis    ??? CAD (coronary artery disease)    ??? Cervical spondylolysis    ??? Chronic pain    ??? COPD    ??? Diabetes ([facility])    ??? GERD (gastroesophageal reflux disease)    ??? Goiter 04/20/2012   ??? Hypertension    ??? Neuropathy 04/06/2013  Past Surgical History   Procedure Laterality Date   ??? Pr cardiac surg procedure unlist       PTCA   ??? 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 Hx knee arthroscopy Right      X2   ??? Hx other surgical  1970     GANGLION CYST   ??? Hx tonsillectomy  1963     Family History   Problem Relation Age of Onset   ??? Lung Disease Mother    ??? Stroke Father    ??? Lung Disease Brother      COPD   ??? Hypertension Brother    ??? Cancer Brother      PROSTATE   ??? Heart Disease Brother    ??? Diabetes Brother    ??? Anesth Problems Neg Hx      Social History   Substance Use Topics   ??? Smoking status: Current Every Day Smoker     Packs/day: 0.30     Years: 45.00     Types: Cigarettes   ??? Smokeless tobacco: Never Used      Comment: passed ready   ??? Alcohol use Yes      Comment: may be 3 times a year if that          Objective:  Visit Vitals   ??? BP 140/60   ??? Ht  (1.651 m)   ??? Wt 199 lb (90.3 kg)   ??? BMI 33.12 kg/m2     wdwn 64 yo wf  In NAD. A&O.  HEENT -- Pupils round.  O/P Clear. Tm- dull  Neck -- Supple. No JVD.  Heart -- RRR. No R/M/G.   Lungs -- Coarse breath sounds  Abdomen -- Soft. Non-tender. Non-distended. No masses. Bowel sounds present.  Extremities -- No edema.  Neuro= cn 2-12 grossly intact.  Unable to stand on one foot      Results for orders placed or performed in visit on 10/10/15   AMB POC URINALYSIS DIP STICK AUTO W/ MICRO   Result Value Ref Range    Color (UA POC) Yellow     Clarity (UA POC) Clear     Glucose (UA POC) Negative Negative    Bilirubin (UA POC) Negative Negative    Ketones (UA POC) Negative Negative    Specific gravity (UA POC) 1.005 1.001 - 1.035    Blood (UA POC) Negative Negative    pH (UA POC) 5.5 4.6 - 8.0    Protein (UA POC) Negative Negative mg/dL    Urobilinogen (UA POC) 0.2 mg/dL 0.2 - 1    Nitrites (UA POC) Negative Negative    Leukocyte esterase (UA POC) Trace Negative         Assessment/Plan:    ICD-10-CM ICD-9-CM    1. Dizziness R42 780.4 REFERRAL TO PHYSICAL THERAPY      AMB POC URINALYSIS DIP STICK AUTO W/ MICRO      CANCELED: AMB POC URINALYSIS DIP STICK MANUAL W/O MICRO   decrease neurontin to 4 at night.  Set up PT        Author:  Lake Bells, MD 10/10/2015 2:05 PM

## 2015-10-10 NOTE — Patient Instructions (Signed)
Decrease neurontin to 4 tabs at night

## 2015-10-24 ENCOUNTER — Ambulatory Visit: Admit: 2015-10-24 | Payer: PRIVATE HEALTH INSURANCE | Attending: Internal Medicine | Primary: Internal Medicine

## 2015-10-24 DIAGNOSIS — R21 Rash and other nonspecific skin eruption: Secondary | ICD-10-CM

## 2015-10-24 MED ORDER — FLUCONAZOLE 150 MG TAB
150 mg | ORAL_TABLET | Freq: Every day | ORAL | 0 refills | Status: AC
Start: 2015-10-24 — End: 2015-10-25

## 2015-10-24 MED ORDER — TRIAMCINOLONE ACETONIDE 0.1 % TOPICAL CREAM
0.1 % | Freq: Every day | CUTANEOUS | 0 refills | Status: DC
Start: 2015-10-24 — End: 2016-10-16

## 2015-10-24 NOTE — Patient Instructions (Signed)
Use triamcinolone cream, no neosporin, use diflucan

## 2015-10-24 NOTE — Progress Notes (Signed)
[name] is a 64 y.o. female and presents with   Chief Complaint   Patient presents with   ??? Rash   .  Has had vaginal itching and swelling in the last week.  Used bacitracin and that made it worse.  Used monistat and that did not help. Uses incontinence pads in the am.      Current Outpatient Prescriptions   Medication Sig Dispense Refill   ??? fluconazole (DIFLUCAN) 150 mg tablet Take 1 Tab by mouth daily for 1 day. FDA advises cautious prescribing of oral fluconazole in pregnancy. 1 Tab 0   ??? triamcinolone acetonide (KENALOG) 0.1 % topical cream Apply  to affected area daily. 15 g 0   ??? cyanocobalamin 1,000 mcg tablet Take 1,000 mcg by mouth daily.     ??? MAGNESIUM CARBONATE by Does Not Apply route.     ??? gabapentin (NEURONTIN) 300 mg capsule TAKE 3 CAPSULES BY MOUTH 3 TIMES A DAY (Patient taking differently: take 2 capsules three times a day) 810 Cap 0   ??? CHANTIX CONTINUING MONTH BOX 1 mg tablet TAKE 1 TABLET BY MOUTH TWICE A DAY 56 Tab 0   ??? pravastatin (PRAVACHOL) 40 mg tablet Take 1 Tab by mouth nightly. 90 Tab 4   ??? solifenacin (VESICARE) 10 mg tablet Take 1 Tab by mouth daily. 90 Tab 4   ??? metFORMIN ER (GLUCOPHAGE XR) 500 mg tablet Take 2 tablets by mouth in the morning with Breakfast and 3 tablets by mouth at bedtime 450 Tab 4   ??? DULoxetine (CYMBALTA) 60 mg capsule TAKE 1 CAPSULE BY MOUTH DAILY 90 Cap 4   ??? insulin detemir (LEVEMIR FLEXTOUCH) 100 unit/mL (3 mL) inpn INJECT 34 UNITS AT BEDTIME DX: E11.65  90 day supply (Patient taking differently: 36 Units nightly. INJECT 34 UNITS AT BEDTIME DX: E11.65  90 day supply) 15 mL 99   ??? ezetimibe (ZETIA) 10 mg tablet Take 1 Tab by mouth nightly. 90 Tab 4   ??? insulin lispro (HUMALOG) 100 unit/mL kwikpen Inject 2-10 units three times daily per sliding scale  DX: 11.65  90 day supply 1 Package 11   ??? traMADol (ULTRAM) 50 mg tablet Take 2 Tabs by mouth every six (6) hours as needed for Pain. 200 Tab 1    ??? multivitamin (ONE A DAY) tablet Take 1 Tab by mouth daily.     ??? aspirin (ASPIRIN) 325 mg tablet Take 325 mg by mouth daily.     ??? carvedilol (COREG) 6.25 mg tablet Take 6.25 mg by mouth daily.     ??? bumetanide (BUMEX) 2 mg tablet Take 2 mg by mouth daily.     ??? Cholecalciferol, Vitamin D3, (VITAMIN D3) 1,000 unit Cap Take 5,000 Units by mouth daily.       Allergies   Allergen Reactions   ??? Morphine Anaphylaxis     Tolerates oxycodone without problem   ??? Avalide [Irbesartan-Hydrochlorothiazide] Myalgia   ??? Demerol [Meperidine] Other (comments)     Makes her feel crazy   ??? Pcn [Penicillins] Swelling   ??? Statins-Hmg-Coa Reductase Inhibitors Myalgia   ??? Sulfa (Sulfonamide Antibiotics) Unknown (comments)   ??? Tricor [Fenofibrate Micronized] Myalgia     Past Medical History   Diagnosis Date   ??? Arthritis    ??? CAD (coronary artery disease)    ??? Cervical spondylolysis    ??? Chronic pain    ??? COPD    ??? Diabetes (HCC)    ??? GERD (gastroesophageal  reflux disease)    ??? Goiter 04/20/2012   ??? Hypertension    ??? Neuropathy 04/06/2013     Past Surgical History   Procedure Laterality Date   ??? Pr cardiac surg procedure unlist       PTCA   ??? 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 Hx knee arthroscopy Right      X2   ??? Hx other surgical  1970     GANGLION CYST   ??? Hx tonsillectomy  1963     Family History   Problem Relation Age of Onset   ??? Lung Disease Mother    ??? Stroke Father    ??? Lung Disease Brother      COPD   ??? Hypertension Brother    ??? Cancer Brother      PROSTATE   ??? Heart Disease Brother    ??? Diabetes Brother    ??? Anesth Problems Neg Hx      Social History   Substance Use Topics   ??? Smoking status: Current Every Day Smoker     Packs/day: 0.30     Years: 45.00     Types: Cigarettes   ??? Smokeless tobacco: Never Used      Comment: passed ready   ??? Alcohol use Yes      Comment: may be 3 times a year if that          Objective:  Visit Vitals   ??? BP 160/80     wdwn 64 yo wf  In NAD. A&O.  HEENT -- Pupils round.  O/P Clear.   Neck -- Supple. No JVD.  Heart -- RRR. No R/M/G.  Lungs -- CTA.  Abdomen -- Soft. Non-tender. Non-distended. No masses. Bowel sounds present.  Extremeties -- No edema.  Vulva- bilat labial swelling, whitish areas amidst erythematous mucosa.  - area does not fluoresce under wood's lamp        Assessment/Plan:    ICD-10-CM ICD-9-CM    1. Rash of genitalia R21 782.1 fluconazole (DIFLUCAN) 150 mg tablet      triamcinolone acetonide (KENALOG) 0.1 % topical cream     Candidiasis vs lichen sclerosis      Author:  Lake Bells, MD 10/24/2015 3:50 PM

## 2015-11-01 ENCOUNTER — Ambulatory Visit: Attending: Clinical Neuropsychologist | Primary: Internal Medicine

## 2015-11-01 MED ORDER — GABAPENTIN 300 MG CAP
300 mg | ORAL_CAPSULE | ORAL | 0 refills | Status: DC
Start: 2015-11-01 — End: 2016-02-03

## 2015-11-29 ENCOUNTER — Encounter: Attending: Neurology | Primary: Internal Medicine

## 2015-12-07 ENCOUNTER — Encounter: Attending: Internal Medicine | Primary: Internal Medicine

## 2015-12-11 MED ORDER — CHANTIX CONTINUING MONTH BOX 1 MG TABLET
1 mg | ORAL_TABLET | ORAL | 3 refills | Status: DC
Start: 2015-12-11 — End: 2016-03-28

## 2016-02-03 MED ORDER — GABAPENTIN 300 MG CAP
300 mg | ORAL_CAPSULE | ORAL | 0 refills | Status: DC
Start: 2016-02-03 — End: 2016-10-16

## 2016-03-04 ENCOUNTER — Ambulatory Visit: Admit: 2016-03-04 | Discharge: 2016-03-05 | Payer: MEDICARE | Attending: Internal Medicine | Primary: Internal Medicine

## 2016-03-04 DIAGNOSIS — E782 Mixed hyperlipidemia: Secondary | ICD-10-CM

## 2016-03-04 LAB — AMB POC URINALYSIS DIP STICK MANUAL W/O MICRO
Bilirubin (UA POC): NEGATIVE
Glucose (UA POC): NEGATIVE
Ketones (UA POC): NEGATIVE
Leukocyte esterase (UA POC): NEGATIVE
Nitrites (UA POC): NEGATIVE
Protein (UA POC): NEGATIVE mg/dL
Specific gravity (UA POC): 1.01 (ref 1.001–1.035)
Urobilinogen (UA POC): 0.2 (ref 0.2–1)
pH (UA POC): 6 (ref 4.6–8.0)

## 2016-03-04 LAB — AMB POC LIPID PROFILE
Cholesterol (POC): 191 mg/dL (ref 100–199)
HDL Cholesterol (POC): 59 mg/dL (ref 35–150)
LDL Cholesterol (POC): 82 mg/dL (ref 0–129)
Non-HDL Goal (POC): 131
TChol/HDL Ratio (POC): 3.2 CALC (ref 0.0–5.0)
Triglycerides (POC): 247 mg/dL — AB (ref 0–150)

## 2016-03-04 LAB — AMB POC HEMOGLOBIN A1C: Hemoglobin A1c (POC): 6.5 % — AB (ref 4.8–5.6)
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
## 2016-03-04 NOTE — Patient Instructions (Signed)
Results for orders placed or performed in visit on 03/04/16   AMB POC URINALYSIS DIP STICK MANUAL W/O MICRO   Result Value Ref Range    Color (UA POC) Yellow     Clarity (UA POC) Clear     Glucose (UA POC) Negative Negative    Bilirubin (UA POC) Negative Negative    Ketones (UA POC) Negative Negative    Specific gravity (UA POC) 1.010 1.001 - 1.035    Blood (UA POC) Trace Negative    pH (UA POC) 6.0 4.6 - 8.0    Protein (UA POC) Negative Negative mg/dL    Urobilinogen (UA POC) 0.2 mg/dL 0.2 - 1    Nitrites (UA POC) Negative Negative    Leukocyte esterase (UA POC) Negative Negative   AMB POC HEMOGLOBIN A1C   Result Value Ref Range    Hemoglobin A1c (POC) 6.5 (A) 4.8 - 5.6 %   AMB POC LIPID PROFILE   Result Value Ref Range    Cholesterol (POC) 191 100 - 199 mg/dL    Triglycerides (POC) 247 (A) 0 - 150 mg/dL    HDL Cholesterol (POC) 59 35 - 150 mg/dL    LDL Cholesterol (POC) 82 0 - 129 mg/dL    Non-HDL Goal (POC) 478131     TChol/HDL Ratio (POC) 3.2 0.0 - 5.0 CALC

## 2016-03-05 LAB — HEPATITIS C ANTIBODY: HCV Ab: 0.1 s/co ratio (ref 0.0–0.9)

## 2016-03-05 LAB — MICROALBUMIN, UR, RAND W/ MICROALB/CREAT RATIO
Creatinine, urine random: 19.1 mg/dL
Microalb/Creat ratio (ug/mg creat.): 395.8 mg/g creat — ABNORMAL HIGH (ref 0.0–30.0)
Microalbumin, urine: 75.6 ug/mL

## 2016-03-05 LAB — HEPATITIS C AB: Hep C Virus Ab: 0.1 s/co ratio (ref 0.0–0.9)

## 2016-03-08 MED ORDER — LEVEMIR FLEXTOUCH U-100 INSULIN 100 UNIT/ML (3 ML) SUBCUTANEOUS PEN
100 unit/mL (3 mL) | SUBCUTANEOUS | 3 refills | Status: DC
Start: 2016-03-08 — End: 2016-07-08

## 2016-03-14 NOTE — Progress Notes (Signed)
pls call let her know we are thinking of them.  Hep c negative, urine negative except for some protein. aic improved.  Triglycerides sl elevated. So continue to decrease carbs.

## 2016-03-14 NOTE — Progress Notes (Signed)
Called patient and relayed doctor's message

## 2016-03-20 MED ORDER — METHOCARBAMOL 500 MG TAB
500 mg | ORAL_TABLET | ORAL | 0 refills | Status: DC
Start: 2016-03-20 — End: 2016-04-30

## 2016-03-23 MED ORDER — PANTOPRAZOLE 40 MG TAB, DELAYED RELEASE
40 mg | ORAL_TABLET | ORAL | 3 refills | Status: DC
Start: 2016-03-23 — End: 2017-01-09

## 2016-03-28 MED ORDER — CHANTIX CONTINUING MONTH BOX 1 MG TABLET
1 mg | ORAL_TABLET | ORAL | 3 refills | Status: DC
Start: 2016-03-28 — End: 2016-08-01

## 2016-03-31 MED ORDER — DULOXETINE 60 MG CAP, DELAYED RELEASE
60 mg | ORAL_CAPSULE | ORAL | 2 refills | Status: DC
Start: 2016-03-31 — End: 2016-12-27

## 2016-04-30 ENCOUNTER — Ambulatory Visit: Admit: 2016-04-30 | Discharge: 2016-04-30 | Payer: MEDICARE | Attending: Internal Medicine | Primary: Internal Medicine

## 2016-04-30 DIAGNOSIS — S0990XA Unspecified injury of head, initial encounter: Secondary | ICD-10-CM

## 2016-04-30 MED ORDER — METFORMIN SR 500 MG 24 HR TABLET
500 mg | ORAL_TABLET | ORAL | 3 refills | Status: DC
Start: 2016-04-30 — End: 2016-10-16

## 2016-04-30 MED ORDER — EZETIMIBE 10 MG TAB
10 mg | ORAL_TABLET | ORAL | 3 refills | Status: DC
Start: 2016-04-30 — End: 2017-04-23
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
## 2016-06-04 ENCOUNTER — Ambulatory Visit: Admit: 2016-06-04 | Discharge: 2016-06-04 | Payer: MEDICARE | Attending: Internal Medicine | Primary: Internal Medicine

## 2016-06-04 DIAGNOSIS — IMO0002 Reserved for concepts with insufficient information to code with codable children: Secondary | ICD-10-CM

## 2016-06-04 LAB — AMB POC LIPID PROFILE
Cholesterol (POC): 188 mg/dL (ref 100–199)
HDL Cholesterol (POC): 57 mg/dL (ref 35–150)
LDL Cholesterol (POC): 75 mg/dL (ref 0–129)
Non-HDL Goal (POC): 131
TChol/HDL Ratio (POC): 3.3 CALC (ref 0.0–5.0)
Triglycerides (POC): 282 mg/dL — AB (ref 0–150)

## 2016-06-04 LAB — AMB POC URINALYSIS DIP STICK MANUAL W/O MICRO
Bilirubin (UA POC): NEGATIVE
Blood (UA POC): NEGATIVE
Glucose (UA POC): NEGATIVE
Ketones (UA POC): NEGATIVE
Nitrites (UA POC): NEGATIVE
Specific gravity (UA POC): 1.025 (ref 1.001–1.035)
Urobilinogen (UA POC): 0.2 (ref 0.2–1)
pH (UA POC): 6 (ref 4.6–8.0)

## 2016-06-04 LAB — AMB POC HEMOGLOBIN A1C: Hemoglobin A1c (POC): 6.3 % — AB (ref 4.8–5.6)

## 2016-06-04 MED ORDER — NABUMETONE 500 MG TAB
500 mg | ORAL_TABLET | Freq: Two times a day (BID) | ORAL | 1 refills | Status: DC
Start: 2016-06-04 — End: 2016-07-02

## 2016-06-04 NOTE — Progress Notes (Deleted)
Kelsey [name] is a 65 y.o. female and presents with   Chief Complaint   Patient presents with   ??? Hospital Follow Up     3 mos   .  ***      Current Outpatient Prescriptions   Medication Sig Dispense Refill   ??? metFORMIN ER (GLUCOPHAGE XR) 500 mg tablet TAKE 2 TABLETS BY MOUTH IN THE MORNING WITH BREAKFAST AND 3 TABLETS BY MOUTH AT BEDTIME (Patient taking differently: TAKE 2 TABLETS BY MOUTH IN THE MORNING WITH BREAKFAST AND 2 tabs at dinner) 450 Tab 3   ??? ezetimibe (ZETIA) 10 mg tablet TAKE 1 TAB BY MOUTH NIGHTLY. 90 Tab 3   ??? DULoxetine (CYMBALTA) 60 mg capsule TAKE 1 CAPSULE BY MOUTH DAILY 90 Cap 2   ??? CHANTIX CONTINUING MONTH BOX 1 mg tablet TAKE 1 TABLET BY MOUTH TWICE A DAY 56 Tab 3   ??? pantoprazole (PROTONIX) 40 mg tablet TAKE 1 TABLET BY MOUTH DAILY 90 Tab 3   ??? LEVEMIR FLEXTOUCH 100 unit/mL (3 mL) inpn INJECT 34 UNITS SUBCUTANEOUSLY AT BEDTIME DX: 250.00 36 Adjustable Dose Pre-filled Pen Syringe 3   ??? gabapentin (NEURONTIN) 300 mg capsule TAKE 3 CAPSULES BY MOUTH 3 TIMES A DAY (Patient taking differently: take 4 caps at bedtime) 810 Cap 0   ??? triamcinolone acetonide (KENALOG) 0.1 % topical cream Apply  to affected area daily. 15 g 0   ??? cyanocobalamin 1,000 mcg tablet Take 1,000 mcg by mouth daily.     ??? MAGNESIUM CARBONATE by Does Not Apply route.     ??? pravastatin (PRAVACHOL) 40 mg tablet Take 1 Tab by mouth nightly. 90 Tab 4   ??? solifenacin (VESICARE) 10 mg tablet Take 1 Tab by mouth daily. 90 Tab 4   ??? insulin detemir (LEVEMIR FLEXTOUCH) 100 unit/mL (3 mL) inpn INJECT 34 UNITS AT BEDTIME DX: E11.65  90 day supply (Patient taking differently: 36 Units nightly. INJECT 34 UNITS AT BEDTIME DX: E11.65  90 day supply) 15 mL 99   ??? insulin lispro (HUMALOG) 100 unit/mL kwikpen Inject 2-10 units three times daily per sliding scale  DX: 11.65  90 day supply 1 Package 11   ??? traMADol (ULTRAM) 50 mg tablet Take 2 Tabs by mouth every six (6) hours as needed for Pain. 200 Tab 1    ??? multivitamin (ONE A DAY) tablet Take 1 Tab by mouth daily.     ??? aspirin (ASPIRIN) 325 mg tablet Take 325 mg by mouth daily.     ??? carvedilol (COREG) 6.25 mg tablet Take 6.25 mg by mouth daily.     ??? bumetanide (BUMEX) 2 mg tablet Take 2 mg by mouth daily.     ??? Cholecalciferol, Vitamin D3, (VITAMIN D3) 1,000 unit Cap Take 5,000 Units by mouth daily.       Allergies   Allergen Reactions   ??? Morphine Anaphylaxis     Tolerates oxycodone without problem   ??? Avalide [Irbesartan-Hydrochlorothiazide] Myalgia   ??? Demerol [Meperidine] Other (comments)     Makes her feel crazy   ??? Pcn [Penicillins] Swelling   ??? Statins-Hmg-Coa Reductase Inhibitors Myalgia   ??? Sulfa (Sulfonamide Antibiotics) Unknown (comments)   ??? Tricor [Fenofibrate Micronized] Myalgia     Past Medical History:   Diagnosis Date   ??? Arthritis    ??? CAD (coronary artery disease)    ??? Cervical spondylolysis    ??? Chronic pain    ??? COPD    ??? Diabetes (HCC)    ???  GERD (gastroesophageal reflux disease)    ??? Goiter 04/20/2012   ??? Hypertension    ??? Neuropathy 04/06/2013     Past Surgical History:   Procedure Laterality Date   ??? 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 CARDIAC SURG PROCEDURE UNLIST      PTCA   ??? HX KNEE ARTHROSCOPY Right     X2   ??? HX OTHER SURGICAL  1970    GANGLION CYST   ??? HX TONSILLECTOMY  1963     Family History   Problem Relation Age of Onset   ??? Lung Disease Mother    ??? Stroke Father    ??? Lung Disease Brother      COPD   ??? Hypertension Brother    ??? Cancer Brother      PROSTATE   ??? Heart Disease Brother    ??? Diabetes Brother    ??? Anesth Problems Neg Hx      Social History   Substance Use Topics   ??? Smoking status: Current Every Day Smoker     Packs/day: 0.30     Years: 45.00     Types: Cigarettes   ??? Smokeless tobacco: Never Used      Comment: passed ready   ??? Alcohol use Yes      Comment: may be 3 times a year if that      The patient {QM PQRS Fall Risk PQRS 154 & [id number]}155:[id number]} a history of  falls. A plan of care for falls {QM PQRS [facility] [id number]}155:[id number]}.  Depression screen positive, {Depression Follow-Up:[id number]}.      Objective:  There were no vitals taken for this visit.    In NAD. A&O.  HEENT -- Pupils round.  O/P Clear.  Neck -- Supple. No JVD.  Heart -- RRR. No R/M/G.  Lungs -- CTA.  Abdomen -- Soft. Non-tender. Non-distended. No masses. Bowel sounds present.  Extremeties -- No edema.        Assessment/Plan:  {No Diagnosis Found}        Author:  Lake BellsNancy J Tekela Garguilo, MD 06/04/2016 10:37 AM

## 2016-06-04 NOTE — Progress Notes (Signed)
Kelsey [name] is a 65 y.o. female and presents with   Chief Complaint   Patient presents with   ??? Hospital Follow Up     3 mos   .      Patient saw Dr. Kayren Eaves and given diclofenac which she can not take= zaps all her energy. Patient down to two cigs daily.  Not checking blood sugars.  Has some sob.  No chest pain.  Sees Dr. Dahlia Client in July,  Has pain in groin- sent to PT.  Seems to be better,          Current Outpatient Prescriptions   Medication Sig Dispense Refill   ??? metFORMIN ER (GLUCOPHAGE XR) 500 mg tablet TAKE 2 TABLETS BY MOUTH IN THE MORNING WITH BREAKFAST AND 3 TABLETS BY MOUTH AT BEDTIME (Patient taking differently: TAKE 2 TABLETS BY MOUTH IN THE MORNING WITH BREAKFAST AND 2 tabs at dinner) 450 Tab 3   ??? ezetimibe (ZETIA) 10 mg tablet TAKE 1 TAB BY MOUTH NIGHTLY. 90 Tab 3   ??? DULoxetine (CYMBALTA) 60 mg capsule TAKE 1 CAPSULE BY MOUTH DAILY 90 Cap 2   ??? CHANTIX CONTINUING MONTH BOX 1 mg tablet TAKE 1 TABLET BY MOUTH TWICE A DAY 56 Tab 3   ??? pantoprazole (PROTONIX) 40 mg tablet TAKE 1 TABLET BY MOUTH DAILY 90 Tab 3   ??? LEVEMIR FLEXTOUCH 100 unit/mL (3 mL) inpn INJECT 34 UNITS SUBCUTANEOUSLY AT BEDTIME DX: 250.00 (Patient taking differently: 38 units in am) 36 Adjustable Dose Pre-filled Pen Syringe 3   ??? gabapentin (NEURONTIN) 300 mg capsule TAKE 3 CAPSULES BY MOUTH 3 TIMES A DAY (Patient taking differently: take 4 caps at bedtime) 810 Cap 0   ??? triamcinolone acetonide (KENALOG) 0.1 % topical cream Apply  to affected area daily. 15 g 0   ??? cyanocobalamin 1,000 mcg tablet Take 1,000 mcg by mouth daily.     ??? MAGNESIUM CARBONATE by Does Not Apply route.     ??? pravastatin (PRAVACHOL) 40 mg tablet Take 1 Tab by mouth nightly. 90 Tab 4   ??? solifenacin (VESICARE) 10 mg tablet Take 1 Tab by mouth daily. 90 Tab 4   ??? insulin detemir (LEVEMIR FLEXTOUCH) 100 unit/mL (3 mL) inpn INJECT 34 UNITS AT BEDTIME DX: E11.65  90 day supply (Patient taking differently: 36  Units nightly. INJECT 34 UNITS AT BEDTIME DX: E11.65  90 day supply) 15 mL 99   ??? insulin lispro (HUMALOG) 100 unit/mL kwikpen Inject 2-10 units three times daily per sliding scale  DX: 11.65  90 day supply 1 Package 11   ??? traMADol (ULTRAM) 50 mg tablet Take 2 Tabs by mouth every six (6) hours as needed for Pain. 200 Tab 1   ??? multivitamin (ONE A DAY) tablet Take 1 Tab by mouth daily.     ??? aspirin (ASPIRIN) 325 mg tablet Take 325 mg by mouth daily.     ??? carvedilol (COREG) 6.25 mg tablet Take 6.25 mg by mouth two (2) times daily (with meals).     ??? bumetanide (BUMEX) 2 mg tablet Take 2 mg by mouth daily.     ??? Cholecalciferol, Vitamin D3, (VITAMIN D3) 1,000 unit Cap Take 5,000 Units by mouth daily.       Allergies   Allergen Reactions   ??? Morphine Anaphylaxis     Tolerates oxycodone without problem   ??? Avalide [Irbesartan-Hydrochlorothiazide] Myalgia   ??? Demerol [Meperidine] Other (comments)     Makes her feel crazy   ???  Pcn [Penicillins] Swelling   ??? Statins-Hmg-Coa Reductase Inhibitors Myalgia   ??? Sulfa (Sulfonamide Antibiotics) Unknown (comments)   ??? Tricor [Fenofibrate Micronized] Myalgia     Past Medical History:   Diagnosis Date   ??? Arthritis    ??? CAD (coronary artery disease)    ??? Cervical spondylolysis    ??? Chronic pain    ??? COPD    ??? Diabetes (HCC)    ??? GERD (gastroesophageal reflux disease)    ??? Goiter 04/20/2012   ??? Hypertension    ??? Neuropathy 04/06/2013     Past Surgical History:   Procedure Laterality Date   ??? 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 CARDIAC SURG PROCEDURE UNLIST      PTCA   ??? HX KNEE ARTHROSCOPY Right     X2   ??? HX OTHER SURGICAL  1970    GANGLION CYST   ??? HX TONSILLECTOMY  1963     Family History   Problem Relation Age of Onset   ??? Lung Disease Mother    ??? Stroke Father    ??? Lung Disease Brother      COPD   ??? Hypertension Brother    ??? Cancer Brother      PROSTATE   ??? Heart Disease Brother    ??? Diabetes Brother    ??? Anesth Problems Neg Hx      Social History   Substance Use Topics    ??? Smoking status: Current Every Day Smoker     Packs/day: 0.30     Years: 45.00     Types: Cigarettes   ??? Smokeless tobacco: Never Used      Comment: passed ready   ??? Alcohol use Yes      Comment: may be 3 times a year if that      The patient has a history of falls. A plan of care for falls was documented..  Depression screen positive, medication was prescribed, drug therapy education given.      Objective:  Visit Vitals   ??? BP 150/80   ??? Ht 5\' 5"  (1.651 m)   ??? Wt 202 lb (91.6 kg)   ??? BMI 33.61 kg/m2     wdwn 65 yo wf  In NAD. A&O.  HEENT -- Pupils round.  O/P Clear.  Neck -- Supple. No JVD.  Heart -- RRR. No R/M/G.  Lungs -- Coarse breath sounds, dec air movement  Abdomen -- Soft. Non-tender. Non-distended. No masses. Bowel sounds present.  Extremities -- No edema. +1 pulses, no lesions      Results for orders placed or performed in visit on 06/04/16   AMB POC URINALYSIS DIP STICK MANUAL W/O MICRO   Result Value Ref Range    Color (UA POC) Yellow     Clarity (UA POC) Clear     Glucose (UA POC) Negative Negative    Bilirubin (UA POC) Negative Negative    Ketones (UA POC) Negative Negative    Specific gravity (UA POC) 1.025 1.001 - 1.035    Blood (UA POC) Negative Negative    pH (UA POC) 6.0 4.6 - 8.0    Protein (UA POC) 2+ Negative mg/dL    Urobilinogen (UA POC) 0.2 mg/dL 0.2 - 1    Nitrites (UA POC) Negative Negative    Leukocyte esterase (UA POC) Trace Negative   AMB POC HEMOGLOBIN A1C   Result Value Ref Range    Hemoglobin A1c (POC) 6.3 (A) 4.8 -  5.6 %   AMB POC LIPID PROFILE   Result Value Ref Range    Cholesterol (POC) 188 100 - 199 mg/dL    Triglycerides (POC) 282 (A) 0 - 150 mg/dL    HDL Cholesterol (POC) 57 35 - 150 mg/dL    LDL Cholesterol (POC) 75 0 - 129 mg/dL    Non-HDL Goal (POC) 161     TChol/HDL Ratio (POC) 3.3 0.0 - 5.0 CALC         Assessment/Plan:    ICD-10-CM ICD-9-CM    1. 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 Mixed hyperlipidemia E78.2 272.2 AMB POC LIPID PROFILE   3. Other dyspnea and respiratory abnormality R06.09 786.09     R09.89     4. Long term (current) use of insulin (HCC) Z79.4 V58.67    5. Hypertension complicating diabetes (HCC) E11.59 250.80     I10 401.9      Decrease salty foods.  Continue exercise      Author:  Lake Bells, MD 06/04/2016 10:46 AM

## 2016-06-04 NOTE — Patient Instructions (Signed)
Results for orders placed or performed in visit on 06/04/16   AMB POC URINALYSIS DIP STICK MANUAL W/O MICRO   Result Value Ref Range    Color (UA POC) Yellow     Clarity (UA POC) Clear     Glucose (UA POC) Negative Negative    Bilirubin (UA POC) Negative Negative    Ketones (UA POC) Negative Negative    Specific gravity (UA POC) 1.025 1.001 - 1.035    Blood (UA POC) Negative Negative    pH (UA POC) 6.0 4.6 - 8.0    Protein (UA POC) 2+ Negative mg/dL    Urobilinogen (UA POC) 0.2 mg/dL 0.2 - 1    Nitrites (UA POC) Negative Negative    Leukocyte esterase (UA POC) Trace Negative   AMB POC HEMOGLOBIN A1C   Result Value Ref Range    Hemoglobin A1c (POC) 6.3 (A) 4.8 - 5.6 %   AMB POC LIPID PROFILE   Result Value Ref Range    Cholesterol (POC) 188 100 - 199 mg/dL    Triglycerides (POC) 282 (A) 0 - 150 mg/dL    HDL Cholesterol (POC) 57 35 - 150 mg/dL    LDL Cholesterol (POC) 75 0 - 129 mg/dL    Non-HDL Goal (POC) 161131     TChol/HDL Ratio (POC) 3.3 0.0 - 5.0 CALC

## 2016-06-16 MED ORDER — VESICARE 10 MG TABLET
10 mg | ORAL_TABLET | ORAL | 4 refills | Status: DC
Start: 2016-06-16 — End: 2017-08-28

## 2016-06-27 ENCOUNTER — Encounter: Attending: Internal Medicine | Primary: Internal Medicine

## 2016-07-02 ENCOUNTER — Encounter

## 2016-07-03 MED ORDER — NABUMETONE 500 MG TAB
500 mg | ORAL_TABLET | ORAL | 1 refills | Status: DC
Start: 2016-07-03 — End: 2016-08-19

## 2016-07-07 ENCOUNTER — Encounter

## 2016-07-08 ENCOUNTER — Ambulatory Visit: Admit: 2016-07-08 | Discharge: 2016-07-08 | Payer: MEDICARE | Attending: Internal Medicine | Primary: Internal Medicine

## 2016-07-08 DIAGNOSIS — I152 Hypertension secondary to endocrine disorders: Secondary | ICD-10-CM

## 2016-07-08 MED ORDER — FENOFIBRATE NANOCRYSTALLIZED 48 MG TAB
48 mg | ORAL_TABLET | Freq: Every day | ORAL | 3 refills | Status: DC
Start: 2016-07-08 — End: 2016-08-29

## 2016-07-08 MED ORDER — ASPIRIN 81 MG TAB, DELAYED RELEASE
81 mg | ORAL_TABLET | Freq: Every day | ORAL | 3 refills | Status: DC
Start: 2016-07-08 — End: 2017-06-21

## 2016-07-08 MED ORDER — CHOLECALCIFEROL (VITAMIN D3) 2,000 UNIT CAPSULE
ORAL_CAPSULE | ORAL | 3 refills | Status: DC
Start: 2016-07-08 — End: 2017-06-23

## 2016-07-08 NOTE — Patient Instructions (Addendum)
Please reduce your Aspirin to 81mg  daily  Please restart Vitamin D at 2000 units daily  Please restart Fenofibrate at 48mg  daily  Please continue to work on smoking cessation   Please go back to see Dr. Lurena Nidaonaldson to discuss your short term memory issues/cognitive issues

## 2016-07-08 NOTE — Progress Notes (Signed)
[facility]  [phone]    Reason for consult: establish cardiovascular care  Requesting physician: self referred    HPI: Kelsey [name], a 65 y.o. year-old who presents for evaluation of CAD.    She has been having more dyspnea with exertion recently, more noticeable with stairs  Harder to breathe in this hot weather  Has had a pinched nerve since March which has been causing pain in left leg, left leg hurts to walk up the steps   Never had any chest pain in the past, her angina equivalent was jaw pain, has not had any jaw pain recently   No palpitations or syncope  Has occasional dizziness and significant balance issues, drinks about 100 oz of water daily  She has had two falls in the past due to balance issues, on gabapentin for neuropathy   Still smoking - on Chantix right now, when she comes off it she smokes more   Not exercising regulary, in PT for left hip  Has had short term memory issues since her CABG/AVR  Difficulty with word finding, cognitive abilities are more limited  Has a hard time remembering people's names  Saw neurology/Dr. Lurena [name] and she referred her to [facility] for evaluation but never heard back from physician at [facility] and had no further evaluation   Encouraged her to follow back up with Dr. Lurena [name] for evaluation    Good friend of Kelsey [name] who passed away, former patient here    Assessment/Plan:  1.  CAD - hx of MI and PCI x 3, had 3 vessel CABG 06/08/2012 by Dr. Bary Richard, with c/o dyspnea with exertion will proceed with lexiscan nuclear stress test to assess for ischemia  -advised her to reduce her Aspirin to  daily, continue Coreg and statin  -follow up in the office in 4-6 weeks  2.  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  Carotid stenosis - right 50-69% stenosis by duplex in 6/16, will repeat carotid duplex to assess for progression, on ASA and statin   4.  PAD s/p stent in distal right SFA, stent in left common iliac artery - last ABI 12/16 showed right 0.82 and left 0.88, on ASA and statin, encouraged her to quit smoking  5.  Dyslipidemia - LDL 75, TG 282 in 6/17, on zetia and pravastatin  daily, had myalgias on fenofibrate in the past but willing to try taking it again, will begin fenofibrate  daily and will check fasting lipids again in 6 weeks  -briefly discussed repatha if unable to get LDL to goal  -also discussed drug holiday off pravastatin in the future if cognitive issues don't resolve  6.  DM Type 2 - Hgb A1c 6.3%, on insulin, followed by Dr. Cherly [name]  7.  HTN - controlled on coreg  8.  Chronic pain/arthritis - management per Dr. Cherly [name]  9.  Vitamin D deficiency - currently off her supplementation, advised her to begin Vitamin D 2000 units daily, discussed maintaining a normal Vitamin D level to help prevent myalgias with statins  10.  RBBB - chronic    ABI 12/16 - right 0.82 and left 0.88  Echo 6/16 - LVEF 55%, no WMA, bovine AVR with normal function, no AI, mild AS, no pericardial effusion, grd 1 dd, MAC with calcified chords noted with mild sclerosis, mild MR, trace TR  Carotid duplex 6/16 - right 50-69%  prox ICA stenosis with moderate hemodynamic compromise, left 1-49% prox ICA stenosis with mild hemodynamic compromise, no change compared to 2014  ABI 12/15 - right 0.70 and left 0.88  Peripheral angiogram 5/15 - stent in distal right SFA, stent in left common iliac artery  ABI 5/15 - right 0.68 and left 0.88  Abd CTA 5/15 - significant PVD, 80% proximal stenosis in celiac plexus, abdominal aorta with diffuse calcification and mural thrombus, mild disease in right and left renal arteries, moderate right iliac disease with CFA stenosis, SFA with moderate disease, left common iliac with ulcerated plaque proximally, moderate to severe disease in iliac system, SFA with moderate to severe diffuse disease   Echo 09/2012 - LVEF 45-50%, AVR ok, mild MR, mild TR  06/08/12 -CABG x 3 (LIMA to LAD, SVG to OM1, SVG to PLB) and [facility]  Cardiac Cath 06/04/2012 - mod AS (30mm gradient), mod AI, diffuse 3 vessel CAD  Cardiac Cath 05/2011 - LVEF 30%, occluded mid RCA, high grade stenosis distal LCx, mild AS s/p PCI/DES to LCx by Dr. Oscar Laaplan  Cardiac Cath 02/2009 - patent stents in RCA and LCx  PCI to LCx 2001  MI/PCI to RCA 2000      Soc Hx: smokes 1/4 ppd, drinks 2 glasses of wine/month, no drug use  Fam Hx: father had CVA at age 66, had a couple of MIs in his 3450s and had arteries replaced with teflon, mother passed at age 984 from aortic aneurysm, brother passed age 66 from sepsis and had bad PVD with multiple bypass surgeries    She  has a past medical history of Arthritis; CAD (coronary artery disease); Cervical spondylolysis; Chronic pain; COPD; Diabetes (HCC); GERD (gastroesophageal reflux disease); Goiter (04/20/2012); Hypertension; and Neuropathy (HCC) (04/06/2013).    Cardiovascular ROS: no chest pain, positive for dyspnea on exertion  Respiratory ROS: no cough or wheezing  Neurological ROS: no TIA or stroke symptoms  All other systems negative except as above.     PE  Vitals:    07/08/16 0951   BP: 120/66   Pulse: 76   Resp: 16   Weight: 197 lb 9.6 oz (89.6 kg)   Height: 5\' 5"  (1.651 m)    Body mass index is 32.88 kg/(m^2).   General appearance - alert, well appearing, and in no distress  Mental status - affect appropriate to mood  Eyes - sclera anicteric, moist mucous membranes  Neck - supple, bilateral carotid bruits vs. Radiation from SEM  Lymphatics - not assessed  Chest - clear to auscultation, no wheezes, rales or rhonchi  Heart - normal rate, regular rhythm, normal S1, S2, 3/6 SEM which radiates to carotids   Abdomen - soft, nontender, nondistended, no masses or organomegaly  Back exam - full range of motion, no tenderness   Neurological - cranial nerves II through XII grossly intact, no focal deficit  Musculoskeletal - no muscular tenderness noted, normal strength  Extremities - peripheral pulses diminished, no pedal edema  Skin - normal coloration  no rashes    12 lead ECG: NSR with RBBB    Recent Labs:  Lab Results   Component Value Date/Time    Cholesterol, total 240 03/15/2011 03:00 AM    HDL Cholesterol 41 03/15/2011 03:00 AM    LDL, calculated 141.6 03/15/2011 03:00 AM    Triglyceride 287 03/15/2011 03:00 AM    CHOL/HDL Ratio 5.9 03/15/2011 03:00 AM     Lab Results  Component Value Date/Time    Creatinine 0.75 09/13/2015 03:19 PM     Lab Results   Component Value Date/Time    BUN 13 09/13/2015 03:19 PM     Lab Results   Component Value Date/Time    Potassium 3.9 09/13/2015 03:19 PM     No results found for: HBA1C, HGBE8, HBA1CEXT, HBA1CEXT  Lab Results   Component Value Date/Time    HGB 12.3 12/17/2012 11:44 AM     Lab Results   Component Value Date/Time    PLATELET 237 03/15/2011 03:00 AM       Reviewed:  Past Medical History:   Diagnosis Date   ??? Arthritis    ??? CAD (coronary artery disease)    ??? Cervical spondylolysis    ??? Chronic pain    ??? COPD    ??? Diabetes (HCC)    ??? GERD (gastroesophageal reflux disease)    ??? Goiter 04/20/2012   ??? Hypertension    ??? Neuropathy (HCC) 04/06/2013     History   Smoking Status   ??? Current Every Day Smoker   ??? Packs/day: 0.30   ??? Years: 45.00   ??? Types: Cigarettes   Smokeless Tobacco   ??? Never Used     Comment: passed ready     History   Alcohol Use   ??? Yes     Comment: may be 3 times a year if that     Allergies   Allergen Reactions   ??? Morphine Anaphylaxis     Tolerates oxycodone without problem   ??? Avalide [Irbesartan-Hydrochlorothiazide] Myalgia   ??? Demerol [Meperidine] Other (comments)     Makes her feel crazy   ??? Pcn [Penicillins] Swelling   ??? Statins-Hmg-Coa Reductase Inhibitors Myalgia   ??? Sulfa (Sulfonamide Antibiotics) Unknown (comments)   ??? Tricor [Fenofibrate Micronized] Myalgia        Current Outpatient Prescriptions   Medication Sig   ??? methocarbamol (ROBAXIN) 500 mg tablet Take 1,000 mg by mouth two (2) times a day.   ??? carvedilol (COREG) 3.125 mg tablet Take  by mouth two (2) times daily (with meals).   ??? methylPREDNISolone (MEDROL DOSEPACK) 4 mg tablet as directed.   ??? Cholecalciferol, Vitamin D3, (VITAMIN D3) 2,000 unit cap capsule One capsule by mouth daily   ??? fenofibrate nanocrystallized (TRICOR) 48 mg tablet Take 1 Tab by mouth daily.   ??? aspirin delayed-release 81 mg tablet Take 1 Tab by mouth daily.   ??? insulin detemir (LEVEMIR FLEXTOUCH) 100 unit/mL (3 mL) inpn 38 Units by SubCUTAneous route nightly.   ??? nabumetone (RELAFEN) 500 mg tablet TAKE 1 TAB BY MOUTH TWO (2) TIMES A DAY.   ??? VESICARE 10 mg tablet TAKE 1 TAB BY MOUTH DAILY.   ??? metFORMIN ER (GLUCOPHAGE XR) 500 mg tablet TAKE 2 TABLETS BY MOUTH IN THE MORNING WITH BREAKFAST AND 3 TABLETS BY MOUTH AT BEDTIME (Patient taking differently: TAKE 2 TABLETS BY MOUTH TWICE DAILY)   ??? ezetimibe (ZETIA) 10 mg tablet TAKE 1 TAB BY MOUTH NIGHTLY.   ??? DULoxetine (CYMBALTA) 60 mg capsule TAKE 1 CAPSULE BY MOUTH DAILY   ??? CHANTIX CONTINUING MONTH BOX 1 mg tablet TAKE 1 TABLET BY MOUTH TWICE A DAY   ??? pantoprazole (PROTONIX) 40 mg tablet TAKE 1 TABLET BY MOUTH DAILY   ??? gabapentin (NEURONTIN) 300 mg capsule TAKE 3 CAPSULES BY MOUTH 3 TIMES A DAY (Patient taking differently: take 4 caps at bedtime)   ??? triamcinolone acetonide (KENALOG) 0.1 %  topical cream Apply  to affected area daily.   ??? pravastatin (PRAVACHOL) 40 mg tablet Take 1 Tab by mouth nightly.   ??? traMADol (ULTRAM) 50 mg tablet Take 2 Tabs by mouth every six (6) hours as needed for Pain.   ??? bumetanide (BUMEX) 2 mg tablet Take 2 mg by mouth daily.     No current facility-administered medications for this visit.        Marcellus Scott, MD  [facility]   [address]  [place], [place] [address]  [phone]

## 2016-07-08 NOTE — Progress Notes (Signed)
Medication list updated per Dr. Browning's verbal orders

## 2016-07-14 ENCOUNTER — Inpatient Hospital Stay: Admit: 2016-07-14 | Payer: MEDICARE | Attending: Specialist | Primary: Internal Medicine

## 2016-07-14 DIAGNOSIS — M4806 Spinal stenosis, lumbar region: Secondary | ICD-10-CM

## 2016-07-22 ENCOUNTER — Institutional Professional Consult (permissible substitution): Admit: 2016-07-22 | Discharge: 2016-07-22 | Payer: MEDICARE | Primary: Internal Medicine

## 2016-07-22 ENCOUNTER — Institutional Professional Consult (permissible substitution): Primary: Internal Medicine

## 2016-07-22 DIAGNOSIS — E1159 Type 2 diabetes mellitus with other circulatory complications: Secondary | ICD-10-CM

## 2016-07-22 DIAGNOSIS — I152 Hypertension secondary to endocrine disorders: Secondary | ICD-10-CM

## 2016-07-22 DIAGNOSIS — I6521 Occlusion and stenosis of right carotid artery: Secondary | ICD-10-CM

## 2016-07-22 MED ORDER — NUCLEAR MEDICINE ISOTOPE
Freq: Once | 0 refills | Status: AC
Start: 2016-07-22 — End: 2016-07-22

## 2016-07-22 MED ORDER — REGADENOSON 0.4 MG/5 ML IV SYRINGE
0.4 mg/5 mL | Freq: Once | INTRAVENOUS | 0 refills | Status: AC
Start: 2016-07-22 — End: 2016-07-22

## 2016-07-22 NOTE — Progress Notes (Signed)
Results in [facility]

## 2016-07-22 NOTE — Progress Notes (Signed)
See scanned document.      Ordering Doctor:Dr. Browning  Reading Doctor:Dr. Browning    Patient tolerated procedure well.

## 2016-07-23 LAB — ECHOCARDIOGRAM COMPLETE 2D W DOPPLER W COLOR: Left Ventricular Ejection Fraction: 58

## 2016-07-24 ENCOUNTER — Inpatient Hospital Stay: Admit: 2016-07-24 | Payer: MEDICARE | Attending: Internal Medicine | Primary: Internal Medicine

## 2016-07-24 ENCOUNTER — Ambulatory Visit: Admit: 2016-07-24 | Discharge: 2016-07-24 | Payer: MEDICARE | Attending: Internal Medicine | Primary: Internal Medicine

## 2016-07-24 DIAGNOSIS — M79672 Pain in left foot: Secondary | ICD-10-CM

## 2016-07-24 NOTE — Procedures (Signed)
[facility]  *** FINAL REPORT ***    Name: Kelsey [name], Kelsey [name]  MRN: [id number]       Outpatient  DOB: 1951-06-04  HIS Order #: [id number]  [facility] Visit #: [id number]  Date: 22 Jul 2016    TYPE OF TEST: Cerebrovascular Duplex    REASON FOR TEST  Known carotid stenosis    Right Carotid:-             Proximal               Mid                 Distal  cm/s  Systolic  Diastolic  Systolic  Diastolic  Systolic  Diastolic  CCA:     45.3      14.0                            64.0      16.0  Bulb:  ECA:     68.0       9.0  ICA:    203.0      54.0      212.0      52.0      100.0      32.0  ICA/CCA:  3.2       3.4    ICA Stenosis: 50-69%    Right Vertebral:-  Finding: Antegrade  Sys:       84.0  Dia:       24.0    Right Subclavian:    Left Carotid:-            Proximal                Mid                 Distal  cm/s  Systolic  Diastolic  Systolic  Diastolic  Systolic  Diastolic  CCA:     64.6      20.0                            75.0      21.0  Bulb:  ECA:    227.0      17.0  ICA:     85.0      23.0       96.0      20.0       91.0      27.0  ICA/CCA:  1.1       1.1    ICA Stenosis: <50%    Left Vertebral:-  Finding: Antegrade  Sys:       48.0  Dia:        9.0    Left Subclavian:    INTERPRETATION/FINDINGS  PROCEDURE:  Evaluation of the extracranial cerebrovascular arteries  with ultrasound (B-mode imaging, pulsed Doppler, color Doppler).  Includes the common carotid, internal carotid, external carotid, and  vertebral arteries.    FINDINGS:  Right:  Mild plaque is present within  the common carotid artery.  Moderate heterogeneous plaque is noted at the carotid bifurcation and  within the proximal internal carotid artery.  Color flow imaging  reveals a significant filling defect in the ICA, where peak velocities   are elevated to 212/52 cm/s.  Left:  MIid plaquing is present within the common carotid artery.  Moderate heterogeneous plaque is noted within the  proximal internal  and external carotid artery.  Color  flow exhibits mild filling defects   in the ICA, where peak velocities remain within normal limits.  Velocities in the ECA are mildly elevated.    IMPRESSION:  1)  50-79% stenosis of the right internal carotid artery.  2)  10-49% stenosis of the left internal carotid artery.  3)  >50% stenosis of the left external carotid artery.  4)  Vertebral flow is antegrade bilaterally.    ADDITIONAL COMMENTS    I have personally reviewed the data relevant to the interpretation of  this  study.    TECHNOLOGIST: Laural Roes, RVT, RDMS, RDCS  Signed: 07/22/2016 11:54 AM    PHYSICIAN: Caleen Jobs. Dahlia Client, MD  Signed: 07/24/2016 04:45 PM

## 2016-07-24 NOTE — Progress Notes (Signed)
Kelsey [name] is a 65 y.o. female and presents with   Chief Complaint   Patient presents with   ??? Foot Pain     left and BP up   .  Patient c/o pain in left foot.  Bought a ham = has been eating for the last week.  Saw cardiologist and started a new med for cholesterol- tricor  Thinks it makes her feel  Badly. So stopped that and the statin.  Not checking blood sugars.      Current Outpatient Prescriptions   Medication Sig Dispense Refill   ??? methocarbamol (ROBAXIN) 500 mg tablet Take 1,000 mg by mouth two (2) times a day.     ??? carvedilol (COREG) 3.125 mg tablet Take 6.25 mg by mouth two (2) times daily (with meals).     ??? methylPREDNISolone (MEDROL DOSEPACK) 4 mg tablet as directed.  0   ??? Cholecalciferol, Vitamin D3, (VITAMIN D3) 2,000 unit cap capsule One capsule by mouth daily 90 Cap 3   ??? fenofibrate nanocrystallized (TRICOR) 48 mg tablet Take 1 Tab by mouth daily. 90 Tab 3   ??? aspirin delayed-release 81 mg tablet Take 1 Tab by mouth daily. 90 Tab 3   ??? insulin detemir (LEVEMIR FLEXTOUCH) 100 unit/mL (3 mL) inpn 38 Units by SubCUTAneous route nightly.     ??? nabumetone (RELAFEN) 500 mg tablet TAKE 1 TAB BY MOUTH TWO (2) TIMES A DAY. 30 Tab 1   ??? VESICARE 10 mg tablet TAKE 1 TAB BY MOUTH DAILY. 90 Tab 4   ??? metFORMIN ER (GLUCOPHAGE XR) 500 mg tablet TAKE 2 TABLETS BY MOUTH IN THE MORNING WITH BREAKFAST AND 3 TABLETS BY MOUTH AT BEDTIME (Patient taking differently: TAKE 2 TABLETS BY MOUTH TWICE DAILY) 450 Tab 3   ??? ezetimibe (ZETIA) 10 mg tablet TAKE 1 TAB BY MOUTH NIGHTLY. 90 Tab 3   ??? DULoxetine (CYMBALTA) 60 mg capsule TAKE 1 CAPSULE BY MOUTH DAILY 90 Cap 2   ??? CHANTIX CONTINUING MONTH BOX 1 mg tablet TAKE 1 TABLET BY MOUTH TWICE A DAY 56 Tab 3   ??? pantoprazole (PROTONIX) 40 mg tablet TAKE 1 TABLET BY MOUTH DAILY 90 Tab 3   ??? gabapentin (NEURONTIN) 300 mg capsule TAKE 3 CAPSULES BY MOUTH 3 TIMES A DAY (Patient taking differently: take 4 caps at bedtime) 810 Cap 0    ??? triamcinolone acetonide (KENALOG) 0.1 % topical cream Apply  to affected area daily. 15 g 0   ??? pravastatin (PRAVACHOL) 40 mg tablet Take 1 Tab by mouth nightly. 90 Tab 4   ??? traMADol (ULTRAM) 50 mg tablet Take 2 Tabs by mouth every six (6) hours as needed for Pain. 200 Tab 1   ??? bumetanide (BUMEX) 2 mg tablet Take 2 mg by mouth daily.       Allergies   Allergen Reactions   ??? Morphine Anaphylaxis     Tolerates oxycodone without problem   ??? Avalide [Irbesartan-Hydrochlorothiazide] Myalgia   ??? Demerol [Meperidine] Other (comments)     Makes her feel crazy   ??? Pcn [Penicillins] Swelling   ??? Statins-Hmg-Coa Reductase Inhibitors Myalgia   ??? Sulfa (Sulfonamide Antibiotics) Unknown (comments)   ??? Tricor [Fenofibrate Micronized] Myalgia     Past Medical History:   Diagnosis Date   ??? Arthritis    ??? CAD (coronary artery disease)    ??? Cervical spondylolysis    ??? Chronic pain    ??? COPD    ??? Diabetes (HCC)    ???  GERD (gastroesophageal reflux disease)    ??? Goiter 04/20/2012   ??? Hypertension    ??? Neuropathy (HCC) 04/06/2013     Past Surgical History:   Procedure Laterality Date   ??? 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 CARDIAC SURG PROCEDURE UNLIST      PTCA   ??? HX KNEE ARTHROSCOPY Right     X2   ??? HX OTHER SURGICAL  1970    GANGLION CYST   ??? HX TONSILLECTOMY  1963     Family History   Problem Relation Age of Onset   ??? Lung Disease Mother    ??? Stroke Father    ??? Lung Disease Brother      COPD   ??? Hypertension Brother    ??? Cancer Brother      PROSTATE   ??? Heart Disease Brother    ??? Diabetes Brother    ??? Anesth Problems Neg Hx      Social History   Substance Use Topics   ??? Smoking status: Current Every Day Smoker     Packs/day: 0.30     Years: 45.00     Types: Cigarettes   ??? Smokeless tobacco: Never Used      Comment: passed ready   ??? Alcohol use Yes      Comment: may be 3 times a year if that            Objective:  Visit Vitals   ??? BP 160/70   ??? Ht  (1.651 m)   ??? Wt 200 lb (90.7 kg)   ??? BMI 33.28 kg/m2     wdwn 64yo wf   In NAD. A&O.  HEENT -- Pupils round.  O/P Clear.  Neck -- Supple. No JVD.  Heart -- RRR. No R/M/G.  Lungs -- CTA.  Abdomen -- Soft. Non-tender. Non-distended. No masses. Bowel sounds present.  Extremities -- No edema.  Left foot tender medial arch poor pulses.  No deformity, erythema or lesion        Assessment/Plan:    ICD-10-CM ICD-9-CM    1. Left foot pain M79.672 729.5 URIC ACID      XR FOOT LT MIN 3 V   2. Long term (current) use of insulin (HCC) Z79.4 V58.67    3. Hypertension complicating diabetes (HCC) E11.59 250.80     I10 401.9    4. Neuropathy (HCC) G62.9 355.9    5. 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 Mixed hyperlipidemia E78.2 272.2    7. Pain of left lower extremity M79.605 729.5      Offered injection,       Author:  Lake Bells, MD 07/24/2016 10:43 AM

## 2016-07-24 NOTE — Procedures (Signed)
[facility]  *** FINAL REPORT ***    Name: OSHA, BORUTA  MRN: [id number]       Outpatient  DOB: Jan 09, 1951  HIS Order #: [id number]  TRAKnet Visit #: [id number]  Date: 22 Jul 2016    TYPE OF TEST: Cerebrovascular Duplex    REASON FOR TEST  Known carotid stenosis    Right Carotid:-             Proximal               Mid                 Distal  cm/s  Systolic  Diastolic  Systolic  Diastolic  Systolic  Diastolic  CCA:     94.8      14.0                            64.0      16.0  Bulb:  ECA:     68.0       9.0  ICA:    203.0      54.0      212.0      52.0      100.0      32.0  ICA/CCA:  3.2       3.4    ICA Stenosis: 50-69%    Right Vertebral:-  Finding: Antegrade  Sys:       84.0  Dia:       24.0    Right Subclavian:    Left Carotid:-            Proximal                Mid                 Distal  cm/s  Systolic  Diastolic  Systolic  Diastolic  Systolic  Diastolic  CCA:     54.6      20.0                            75.0      21.0  Bulb:  ECA:    227.0      17.0  ICA:     85.0      23.0       96.0      20.0       91.0      27.0  ICA/CCA:  1.1       1.1    ICA Stenosis: <50%    Left Vertebral:-  Finding: Antegrade  Sys:       48.0  Dia:        9.0    Left Subclavian:    INTERPRETATION/FINDINGS  PROCEDURE:  Evaluation of the extracranial cerebrovascular arteries  with ultrasound (B-mode imaging, pulsed Doppler, color Doppler).  Includes the common carotid, internal carotid, external carotid, and  vertebral arteries.    FINDINGS:  Right:  Mild plaque is present within  the common carotid artery.  Moderate heterogeneous plaque is noted at the carotid bifurcation and  within the proximal internal carotid artery.  Color flow imaging  reveals a significant filling defect in the ICA, where peak velocities   are elevated to 212/52 cm/s.  Left:  MIid plaquing is present within the common carotid artery.  Moderate heterogeneous plaque is noted within the  proximal internal   and external carotid artery.  Color flow exhibits mild filling defects   in the ICA, where peak velocities remain within normal limits.  Velocities in the ECA are mildly elevated.    IMPRESSION:  1)  50-79% stenosis of the right internal carotid artery.  2)  10-49% stenosis of the left internal carotid artery.  3)  >50% stenosis of the left external carotid artery.  4)  Vertebral flow is antegrade bilaterally.    ADDITIONAL COMMENTS    I have personally reviewed the data relevant to the interpretation of  this  study.    TECHNOLOGIST: Laural Roes, RVT, RDMS, RDCS  Signed: 07/22/2016 11:54 AM    PHYSICIAN: Caleen Jobs. Dahlia Client, MD  Signed: 07/24/2016 04:45 PM

## 2016-07-25 ENCOUNTER — Encounter

## 2016-07-25 LAB — URIC ACID: Uric acid: 7.5 mg/dL — ABNORMAL HIGH (ref 2.5–7.1)

## 2016-07-25 MED ORDER — CARVEDILOL 12.5 MG TAB
12.5 mg | ORAL_TABLET | Freq: Two times a day (BID) | ORAL | 3 refills | Status: DC
Start: 2016-07-25 — End: 2016-09-04

## 2016-07-25 NOTE — Telephone Encounter (Signed)
Refill for BP meds is needed with the increase per patient, please order/change chart for carvedilol, patient states foot is better today will call later this afternoon if she needs injection

## 2016-07-25 NOTE — Progress Notes (Signed)
pls call- uric acid is slightly high, so this could be gout.  If hurting more, consider injection.

## 2016-07-25 NOTE — Telephone Encounter (Signed)
I sent it to pharmacy

## 2016-07-25 NOTE — Progress Notes (Signed)
Called patient and relayed doctor's message, seems to be better today

## 2016-08-01 MED ORDER — CHANTIX CONTINUING MONTH BOX 1 MG TABLET
1 mg | ORAL_TABLET | ORAL | 3 refills | Status: DC
Start: 2016-08-01 — End: 2016-10-16

## 2016-08-04 NOTE — Telephone Encounter (Signed)
The following test results given to patient per Dr. Dahlia ClientBrowning:      Carotid:  8/17, 50-79% rICA, <50% lICA    Echo: 8/17, EF 60%,  bovine AVR, no reg, mild MR, triv TR/PV, increased wall thickness    Nuclear: 8/17, normal Lexi Nuc, EF 63%.    Follow up as scheduled:    Future Appointments  Date Time Provider Department Center   08/22/2016 12:15 PM Marcellus Scotthristine M Browning, MD [facility] SCHED   09/30/2016 2:30 PM Lake BellsNancy J Pahle, MD [facility]     2 pt identifiers used

## 2016-08-19 ENCOUNTER — Encounter

## 2016-08-20 MED ORDER — NABUMETONE 500 MG TAB
500 mg | ORAL_TABLET | ORAL | 1 refills | Status: DC
Start: 2016-08-20 — End: 2016-08-29

## 2016-08-22 ENCOUNTER — Encounter: Attending: Internal Medicine | Primary: Internal Medicine

## 2016-08-22 ENCOUNTER — Inpatient Hospital Stay: Admit: 2016-10-02 | Payer: MEDICARE | Primary: Internal Medicine

## 2016-08-22 ENCOUNTER — Ambulatory Visit: Admit: 2016-08-22 | Discharge: 2016-08-22 | Payer: MEDICARE | Attending: Internal Medicine | Primary: Internal Medicine

## 2016-08-22 DIAGNOSIS — I2510 Atherosclerotic heart disease of native coronary artery without angina pectoris: Secondary | ICD-10-CM

## 2016-08-22 NOTE — Progress Notes (Signed)
[facility]  [phone]    Reason for consult: establish cardiovascular care  Requesting physician: self referred    HPI: Kelsey [name], a 65 y.o. year-old who presents for evaluation of CAD.    She is having a lot of trouble with her hip and that is limiting her activity. She is talking aobut hip replacement and needs preop clearance. She had a stress test a month ago that looked ok- lateral defect that was mostly fixed. Hip replacement is not schedule yet.   Next labs will get NMR to direct therapy, ldl close to goal on prava ?would benefit from repatha?    She has been having more dyspnea with exertion recently, more noticeable with stairs, continues to progress and is concerning for cad progression.     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 Lurena Nida and she referred her to [facility] for evaluation but never heard back from physician at [facility] and had no further evaluation   Encouraged her to follow back up with Dr. Lurena Nida for evaluation    Good friend of [name] who passed away, former patient here    Assessment/Plan:  1.  CAD - hx of MI and PCI x 3, had 3 vessel CABG 06/08/2012 by Dr. Bary Richard, with c/o dyspnea with exertion will proceed with cath for cad progression assessment  -advised her to reduce her Aspirin to 81mg  daily, continue Coreg and statin  -had to stop fenofibrate due to myalgias   2.  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  Carotid stenosis - right 50-69% stenosis by duplex in 6/16, will repeat carotid duplex to assess for progression, on ASA and statin  4.  PAD s/p stent in distal right SFA, stent in left common iliac artery - last ABI 12/16 showed right 0.82 and left 0.88, on ASA and statin, encouraged her to quit smoking  5.  Dyslipidemia - LDL 75, TG 282 in 6/17, on zetia and pravastatin 40mg  daily, had myalgias on fenofibrate in the past but willing to try taking it again, will begin fenofibrate 48mg  daily and will check fasting lipids again in 6 weeks  -briefly discussed repatha if unable to get LDL to goal  -also discussed drug holiday off pravastatin in the future if cognitive issues don't resolve  -has not taken   6.  DM Type 2 - Hgb A1c 6.3%, on insulin, followed by Dr. Cherly Hensen  7.  HTN - controlled on coreg  8.  Chronic pain/arthritis - management per Dr. Cherly Hensen  9.  Vitamin D deficiency - currently off her supplementation, advised her to begin Vitamin D 2000 units daily, discussed maintaining a normal Vitamin D level to help prevent myalgias with statins  10.  RBBB - chronic  ABI 12/16 - right 0.82 and left 0.88  Echo 6/16 - LVEF 55%, no WMA, bovine AVR with normal function, no AI, mild AS, no pericardial effusion, grd 1 dd, MAC with calcified chords noted with mild sclerosis, mild MR, trace TR  Carotid duplex 6/16 - right 50-69% prox ICA stenosis with moderate hemodynamic compromise, left 1-49% prox ICA stenosis with mild hemodynamic compromise, no change compared to 2014  ABI 12/15 - right 0.70 and left 0.88  Peripheral angiogram 5/15 - stent in distal right SFA, stent in left common iliac artery  ABI 5/15 - right 0.68 and left 0.88  Abd CTA 5/15 - significant PVD, 80% proximal stenosis in celiac plexus, abdominal aorta with diffuse calcification and mural thrombus, mild  disease in right and left renal arteries, moderate right iliac disease with CFA stenosis, SFA with moderate disease, left common iliac with ulcerated plaque proximally, moderate to severe disease in iliac system, SFA with moderate to severe diffuse disease  Echo 09/2012 - LVEF 45-50%, AVR ok, mild MR, mild TR  06/08/12 -CABG x 3 (LIMA to LAD, SVG to OM1, SVG to PLB) [facility]  Cardiac Cath 06/04/2012 - mod AS (30mm gradient), mod AI, diffuse 3 vessel CAD  Cardiac Cath 05/2011 - LVEF 30%, occluded mid RCA, high grade stenosis distal LCx, mild AS s/p PCI/DES to LCx by Dr. Oscar Laaplan  Cardiac Cath 02/2009 - patent stents in RCA and LCx  PCI to LCx 2001  MI/PCI to RCA 2000      Soc Hx: smokes 1/4 ppd, drinks 2 glasses of wine/month, no drug use  Fam Hx: father had CVA at age 66, had a couple of MIs in his 1250s and had arteries replaced with teflon, mother passed at age 66 from aortic aneurysm, brother passed age 66 from sepsis and had bad PVD with multiple bypass surgeries    She  has a past medical history of Arthritis; CAD (coronary artery disease); Cervical spondylolysis; Chronic pain; COPD; Diabetes (HCC); GERD (gastroesophageal reflux disease); Goiter (04/20/2012); Hypertension; and Neuropathy (HCC) (04/06/2013).    Cardiovascular ROS: no chest pain, positive for dyspnea on exertion  Respiratory ROS: no cough or wheezing  Neurological ROS: no TIA or stroke symptoms  All other systems negative except as above.     PE  Vitals:    08/22/16 1058   BP: 96/60   Pulse: 72   Resp: 16   Weight: 201 lb 6.4 oz (91.4 kg)   Height: 5\' 5"  (1.651 m)    Body mass index is 33.51 kg/(m^2).   General appearance - alert, well appearing, and in no distress  Mental status - affect appropriate to mood  Eyes - sclera anicteric, moist mucous membranes  Neck - supple, bilateral carotid bruits vs. Radiation from SEM  Lymphatics - not assessed  Chest - clear to auscultation, no wheezes, rales or rhonchi   Heart - normal rate, regular rhythm, normal S1, S2, 3/6 SEM which radiates to carotids   Abdomen - soft, nontender, nondistended, no masses or organomegaly  Back exam - full range of motion, no tenderness  Neurological - cranial nerves II through XII grossly intact, no focal deficit  Musculoskeletal - no muscular tenderness noted, normal strength  Extremities - peripheral pulses diminished, no pedal edema  Skin - normal coloration  no rashes    12 lead ECG: NSR with RBBB    Recent Labs:  Lab Results   Component Value Date/Time  Cholesterol, total 240 03/15/2011 03:00 AM    HDL Cholesterol 41 03/15/2011 03:00 AM    LDL, calculated 141.6 03/15/2011 03:00 AM    Triglyceride 287 03/15/2011 03:00 AM    CHOL/HDL Ratio 5.9 03/15/2011 03:00 AM     Lab Results   Component Value Date/Time    Creatinine 0.75 09/13/2015 03:19 PM     Lab Results   Component Value Date/Time    BUN 13 09/13/2015 03:19 PM     Lab Results   Component Value Date/Time    Potassium 3.9 09/13/2015 03:19 PM     No results found for: HBA1C, HGBE8, HBA1CEXT, HBA1CEXT  Lab Results   Component Value Date/Time    HGB 12.3 12/17/2012 11:44 AM     Lab Results   Component Value Date/Time    PLATELET 237 03/15/2011 03:00 AM       Reviewed:  Past Medical History:   Diagnosis Date   ??? Arthritis    ??? CAD (coronary artery disease)    ??? Cervical spondylolysis    ??? Chronic pain    ??? COPD    ??? Diabetes (HCC)    ??? GERD (gastroesophageal reflux disease)    ??? Goiter 04/20/2012   ??? Hypertension    ??? Neuropathy (HCC) 04/06/2013     History   Smoking Status   ??? Current Every Day Smoker   ??? Packs/day: 0.30   ??? Years: 45.00   ??? Types: Cigarettes   Smokeless Tobacco   ??? Never Used     Comment: passed ready     History   Alcohol Use   ??? Yes     Comment: may be 3 times a year if that     Allergies   Allergen Reactions   ??? Morphine Anaphylaxis     Tolerates oxycodone without problem   ??? Avalide [Irbesartan-Hydrochlorothiazide] Myalgia   ??? Demerol [Meperidine] Other (comments)      Makes her feel crazy   ??? Pcn [Penicillins] Swelling   ??? Statins-Hmg-Coa Reductase Inhibitors Myalgia   ??? Sulfa (Sulfonamide Antibiotics) Unknown (comments)   ??? Tricor [Fenofibrate Micronized] Myalgia       Current Outpatient Prescriptions   Medication Sig   ??? nabumetone (RELAFEN) 500 mg tablet TAKE 1 TAB BY MOUTH TWO (2) TIMES A DAY.   ??? CHANTIX CONTINUING MONTH BOX 1 mg tablet TAKE 1 TABLET BY MOUTH TWICE A DAY   ??? carvedilol (COREG) 12.5 mg tablet Take 1 Tab by mouth two (2) times daily (with meals).   ??? methocarbamol (ROBAXIN) 500 mg tablet Take 1,000 mg by mouth two (2) times a day.   ??? methylPREDNISolone (MEDROL DOSEPACK) 4 mg tablet as directed.   ??? Cholecalciferol, Vitamin D3, (VITAMIN D3) 2,000 unit cap capsule One capsule by mouth daily   ??? fenofibrate nanocrystallized (TRICOR) 48 mg tablet Take 1 Tab by mouth daily.   ??? aspirin delayed-release 81 mg tablet Take 1 Tab by mouth daily.   ??? insulin detemir (LEVEMIR FLEXTOUCH) 100 unit/mL (3 mL) inpn 38 Units by SubCUTAneous route nightly.   ??? VESICARE 10 mg tablet TAKE 1 TAB BY MOUTH DAILY.   ??? metFORMIN ER (GLUCOPHAGE XR) 500 mg tablet TAKE 2 TABLETS BY MOUTH IN THE MORNING WITH BREAKFAST AND 3 TABLETS BY MOUTH AT BEDTIME (Patient taking differently: TAKE 2 TABLETS BY MOUTH TWICE DAILY)   ??? ezetimibe (ZETIA) 10 mg tablet TAKE 1 TAB BY MOUTH NIGHTLY.   ??? DULoxetine (CYMBALTA) 60 mg capsule TAKE 1  CAPSULE BY MOUTH DAILY   ??? pantoprazole (PROTONIX) 40 mg tablet TAKE 1 TABLET BY MOUTH DAILY   ??? gabapentin (NEURONTIN) 300 mg capsule TAKE 3 CAPSULES BY MOUTH 3 TIMES A DAY (Patient taking differently: take 4 caps at bedtime)   ??? triamcinolone acetonide (KENALOG) 0.1 % topical cream Apply  to affected area daily.   ??? pravastatin (PRAVACHOL) 40 mg tablet Take 1 Tab by mouth nightly.   ??? traMADol (ULTRAM) 50 mg tablet Take 2 Tabs by mouth every six (6) hours as needed for Pain.   ??? bumetanide (BUMEX) 2 mg tablet Take 2 mg by mouth daily.      No current facility-administered medications for this visit.        Marcellus Scott, MD  [facility]   [address]  [place], [place] [address]  [phone]

## 2016-08-23 LAB — CBC W/O DIFF
HCT: 34.5 % (ref 34.0–46.6)
HGB: 11.5 g/dL (ref 11.1–15.9)
MCH: 26.9 pg (ref 26.6–33.0)
MCHC: 33.3 g/dL (ref 31.5–35.7)
MCV: 81 fL (ref 79–97)
PLATELET: 298 10*3/uL (ref 150–379)
RBC: 4.28 x10E6/uL (ref 3.77–5.28)
RDW: 16.3 % — ABNORMAL HIGH (ref 12.3–15.4)
WBC: 9.9 10*3/uL (ref 3.4–10.8)

## 2016-08-23 LAB — METABOLIC PANEL, BASIC
BUN/Creatinine ratio: 20 (ref 12–28)
BUN: 19 mg/dL (ref 8–27)
CO2: 24 mmol/L (ref 18–29)
Calcium: 9.5 mg/dL (ref 8.7–10.3)
Chloride: 90 mmol/L — ABNORMAL LOW (ref 96–106)
Creatinine: 0.93 mg/dL (ref 0.57–1.00)
GFR est AA: 75 mL/min/{1.73_m2} (ref >59)
GFR est non-AA: 65 mL/min/{1.73_m2} (ref >59)
Glucose: 121 mg/dL — ABNORMAL HIGH (ref 65–99)
Potassium: 4.7 mmol/L (ref 3.5–5.2)
Sodium: 130 mmol/L — ABNORMAL LOW (ref 134–144)

## 2016-08-29 ENCOUNTER — Inpatient Hospital Stay: Admit: 2016-08-29 | Payer: MEDICARE | Attending: Specialist | Primary: Internal Medicine

## 2016-08-29 ENCOUNTER — Encounter

## 2016-08-29 DIAGNOSIS — I2510 Atherosclerotic heart disease of native coronary artery without angina pectoris: Secondary | ICD-10-CM

## 2016-08-29 LAB — GLUCOSE, POC: Glucose (POC): 153 mg/dL — ABNORMAL HIGH (ref 65–100)

## 2016-08-29 MED ORDER — IOPAMIDOL 76 % IV SOLN
370 mg iodine /mL (76 %) | INTRAVENOUS | Status: DC | PRN
Start: 2016-08-29 — End: 2016-08-29
  Administered 2016-08-29: 15:00:00 via INTRA_ARTERIAL

## 2016-08-29 MED ORDER — HEPARIN (PORCINE) IN NS (PF) 1,000 UNIT/500 ML IV
1000 unit/500 mL | INTRAVENOUS | Status: DC | PRN
Start: 2016-08-29 — End: 2016-08-29
  Administered 2016-08-29: 14:00:00

## 2016-08-29 MED ORDER — SODIUM CHLORIDE 0.9 % IV
INTRAVENOUS | Status: DC
Start: 2016-08-29 — End: 2016-08-29
  Administered 2016-08-29: 13:00:00 via INTRAVENOUS

## 2016-08-29 MED ORDER — CLOPIDOGREL 300 MG TAB
300 mg | ORAL | Status: DC | PRN
Start: 2016-08-29 — End: 2016-08-29
  Administered 2016-08-29: 15:00:00 via ORAL

## 2016-08-29 MED ORDER — ADV ADDAPTOR
250 mg | Status: DC
Start: 2016-08-29 — End: 2016-08-29
  Administered 2016-08-29: 14:00:00 via INTRAVENOUS

## 2016-08-29 MED ORDER — LIDOCAINE HCL 1 % (10 MG/ML) IJ SOLN
10 mg/mL (1 %) | INTRAMUSCULAR | Status: DC | PRN
Start: 2016-08-29 — End: 2016-08-29
  Administered 2016-08-29: 14:00:00 via SUBCUTANEOUS

## 2016-08-29 MED ORDER — MIDAZOLAM 1 MG/ML IJ SOLN
1 mg/mL | INTRAMUSCULAR | Status: DC | PRN
Start: 2016-08-29 — End: 2016-08-29
  Administered 2016-08-29 (×2): via INTRAVENOUS

## 2016-08-29 MED ORDER — SODIUM CHLORIDE 0.9 % IJ SYRG
INTRAMUSCULAR | Status: DC | PRN
Start: 2016-08-29 — End: 2016-08-29

## 2016-08-29 MED ORDER — SODIUM CHLORIDE 0.9 % IV
INTRAVENOUS | Status: AC
Start: 2016-08-29 — End: 2016-08-29
  Administered 2016-08-29 (×2): via INTRAVENOUS

## 2016-08-29 MED ORDER — IOPAMIDOL 76 % IV SOLN
370 mg iodine /mL (76 %) | INTRAVENOUS | Status: DC | PRN
Start: 2016-08-29 — End: 2016-08-29
  Administered 2016-08-29: 15:00:00 via INTRAVENOUS

## 2016-08-29 MED ORDER — ATROPINE 0.1 MG/ML SYRINGE
0.1 mg/mL | INTRAMUSCULAR | Status: DC | PRN
Start: 2016-08-29 — End: 2016-08-29

## 2016-08-29 MED ORDER — FENTANYL CITRATE (PF) 50 MCG/ML IJ SOLN
50 mcg/mL | INTRAMUSCULAR | Status: DC | PRN
Start: 2016-08-29 — End: 2016-08-29
  Administered 2016-08-29 (×2): via INTRAVENOUS

## 2016-08-29 MED ORDER — NITROGLYCERIN 0.1 MG/ML (100 MCG/ML) D5W COMPOUNDED INJECTION
0.1 mg/mL | INTRAVENOUS | Status: DC | PRN
Start: 2016-08-29 — End: 2016-08-29

## 2016-08-29 MED ORDER — CLOPIDOGREL 75 MG TAB
75 mg | ORAL_TABLET | Freq: Every day | ORAL | 11 refills | Status: DC
Start: 2016-08-29 — End: 2017-08-23

## 2016-08-29 MED ORDER — HEPARIN (PORCINE) 1,000 UNIT/ML IJ SOLN
1000 unit/mL | INTRAMUSCULAR | Status: DC | PRN
Start: 2016-08-29 — End: 2016-08-29

## 2016-08-29 MED FILL — BIVALIRUDIN 250 MG SOLUTION: 250 mg | INTRAVENOUS | Qty: 5

## 2016-08-29 MED FILL — SODIUM CHLORIDE 0.9 % IV: INTRAVENOUS | Qty: 1000

## 2016-08-29 MED FILL — MIDAZOLAM 1 MG/ML IJ SOLN: 1 mg/mL | INTRAMUSCULAR | Qty: 5

## 2016-08-29 MED FILL — ISOVUE-370  76 % INTRAVENOUS SOLUTION: 370 mg iodine /mL (76 %) | INTRAVENOUS | Qty: 50

## 2016-08-29 MED FILL — ATROPINE 0.1 MG/ML SYRINGE: 0.1 mg/mL | INTRAMUSCULAR | Qty: 10

## 2016-08-29 MED FILL — HEPARIN (PORCINE) 1,000 UNIT/ML IJ SOLN: 1000 unit/mL | INTRAMUSCULAR | Qty: 10

## 2016-08-29 MED FILL — FENTANYL CITRATE (PF) 50 MCG/ML IJ SOLN: 50 mcg/mL | INTRAMUSCULAR | Qty: 2

## 2016-08-29 MED FILL — NITROGLYCERIN 0.1 MG/ML (100 MCG/ML) D5W COMPOUNDED INJECTION: 0.1 mg/mL | INTRAVENOUS | Qty: 10

## 2016-08-29 MED FILL — ISOVUE-370  76 % INTRAVENOUS SOLUTION: 370 mg iodine /mL (76 %) | INTRAVENOUS | Qty: 400

## 2016-08-29 MED FILL — PLAVIX 300 MG TABLET: 300 mg | ORAL | Qty: 2

## 2016-08-29 MED FILL — NORMAL SALINE FLUSH 0.9 % INJECTION SYRINGE: INTRAMUSCULAR | Qty: 10

## 2016-08-29 MED FILL — HEPARIN (PORCINE) IN NS (PF) 1,000 UNIT/500 ML IV: 1000 unit/500 mL | INTRAVENOUS | Qty: 1000

## 2016-08-29 MED FILL — LIDOCAINE HCL 1 % (10 MG/ML) IJ SOLN: 10 mg/mL (1 %) | INTRAMUSCULAR | Qty: 20

## 2016-08-29 NOTE — Procedures (Signed)
Cardiac Catheterization Procedure Note   Patient: Kelsey HeadsDiana H Detweiler  MRN: [id number]  SSN: [id number]   Date of Birth: 13-Jul-1951 Age: 65 y.o.  Sex: female    Date of Procedure: 08/29/2016   Pre-procedure Diagnosis: Coronary Artery Disease  Post-procedure Diagnosis: Coronary Artery Disease  Procedure: Left Heart Cath with Bypass Grafts and PCI  Operator:  Dr. Bari EdwardMark F Orlandria Kissner, MD    Assistant(s):  None  Anesthesia: Moderate Sedation   Estimated Blood Loss: Less than 10 mL   Specimens Removed: None  Findings: Patent LIMA to LAD, SVG's to RCA and LCX.  Very large diagonal has eccentric 90% stenosis, treated with 2.75 x 12 Xience DES good result.  Bioprosthetic AV not crossed given recent echo.  Angioseal to groin, Plavix begun.  Complications: None   Implants:  None  Signed by:  Bari EdwardMark F Yurani Fettes, MD  08/29/2016  10:48 AM

## 2016-08-29 NOTE — Progress Notes (Signed)
Cardiac Cath Lab Procedure Area Arrival Note:    Kelsey [name] arrived to Cardiac Cath Lab, Procedure Area. Patient identifiers verified with NAME and DATE OF BIRTH. Procedure verified with patient. Consent forms verified. Allergies verified. Patient informed of procedure and plan of care. Questions answered with review. Patient voiced understanding of procedure and plan of care.    Patient on cardiac monitor, non-invasive blood pressure, SPO2 monitor. On room air then placed on O2 @ 2 lpm via NC.  IV of normal saline on pump at 272 ml/hr. Patient status doing well without problems. Patient is A&Ox 4. Patient reports no pain.     Patient medicated during procedure with orders obtained and verified by Dr. Sheliah Hatch.    Refer to patients Cardiac Cath Lab PROCEDURE REPORT for vital signs, assessment, status, and response during procedure, printed at end of case. Printed report on chart or scanned into chart.

## 2016-08-29 NOTE — Procedures (Signed)
Cardiac Catheterization Procedure Note   Patient: Kelsey [name]  MRN: [id number]  SSN: [id number]   Date of Birth: 06/13/1951 Age: 64 y.o.  Sex: female    Date of Procedure: 08/29/2016   Pre-procedure Diagnosis: Coronary Artery Disease  Post-procedure Diagnosis: Coronary Artery Disease  Procedure: Left Heart Cath with Bypass Grafts and PCI  Operator:  Dr. Enzley Kitchens F Samanvitha Germany, MD    Assistant(s):  None  Anesthesia: Moderate Sedation   Estimated Blood Loss: Less than 10 mL   Specimens Removed: None  Findings: Patent LIMA to LAD, SVG's to RCA and LCX.  Very large diagonal has eccentric 90% stenosis, treated with 2.75 x 12 Xience DES good result.  Bioprosthetic AV not crossed given recent echo.  Angioseal to groin, Plavix begun.  Complications: None   Implants:  None  Signed by:  Tanai Bouler F Rhone Ozaki, MD  08/29/2016  10:48 AM

## 2016-08-29 NOTE — Other (Signed)
Cardiac Wellness: CAD education folder to the cath lab bedside of Kelsey [name]. She is very groggy but is agreeable to me making a cardiac rehab intake appointment for her, "after the week of 09/15/16". Intake scheduled for Wednesday, 09/24/16 at 9:30 am. Information reviewed with family member and placed on the AVS.

## 2016-08-29 NOTE — Progress Notes (Signed)
SHEATH PULL NOTE:    Patient informed of procedure with questions answered with review. Sheath site prepped with Chloraprep swab. 6 fr sheath in RFA pulled by V. Owens ,RN. Hand hold and Quik Clot, with manual compression to site. No bleeding, no hematoma, no pain at site. Hemostasis obtained with hand hold/manual compression at site. Patient tolerated well. No change in status. Handhold for 15 minutes. No change at site. Tegaderm dressing applied to site. No bleeding, no hematoma, no pain/discomfort at site. Groin instructions provided with review. Continue to monitor procedure site and patient status.    *Advised patient to keep head flat and extremity flat to decrease risk of bleeding.    *Recommended that patient not drink for ONE HOUR post sheath pull completion.    *Recommended that patient not eat for TWO HOURS post sheath pull completion.    *Instructed patient on rationale for delay of PO products to decrease risk for aspiration and if additional treatment to procedure site is required. Patient verbalized understanding of instructions with review.

## 2016-08-29 NOTE — Progress Notes (Signed)
TRANSFER - IN REPORT:    Verbal report received from [place] [facility]  on Kelsey [name]  being received from cath lab procedure area  for routine progression of care. Report consisted of patient???s Situation, Background, Assessment and Recommendations(SBAR). Information from the following report(s) Kardex and Procedure Summary was reviewed with the receiving clinician. Opportunity for questions and clarification was provided. Assessment completed upon patient???s arrival to Cardiac Cath Lab RECOVERY AREA and care assumed.       Cardiac Cath Lab Recovery Arrival Note:    Kelsey [name] arrived to CCL recovery area.  Patient procedure= LHC with stent placement. Patient on cardiac monitor, non-invasive blood pressure, SPO2 monitor. On  O2 @ 2 lpm via NC.  IV  of NS on pump at 136 ml/hr. Patient status doing well without problems. Patient is A&Ox 4. Patient reports no pain.    PROCEDURE SITE CHECK:    Procedure site:without any bleeding and no hematoma, No pain/discomfort reported at procedure site.     No change in patient status. Continue to monitor patient and status.

## 2016-08-29 NOTE — Progress Notes (Signed)
Discussed with the patient and all questioned fully answered. She will call me if any problems arise.

## 2016-08-29 NOTE — Progress Notes (Signed)
TRANSFER - OUT REPORT:    Verbal report given to Harrold Donath, RCIS to be given to Lyndal Pulley, RN (name) on Kelsey [name]  being transferred to cath lab recovery room (unit) for routine progression of care       Report consisted of patient???s Situation, Background, Assessment and   Recommendations(SBAR).     Information from the following report(s) SBAR, Procedure Summary and MAR was reviewed with the receiving nurse.    Lines:   Peripheral IV 08/29/16 Left Antecubital (Active)   Site Assessment Clean, dry, & intact 08/29/2016  9:05 AM   Phlebitis Assessment 0 08/29/2016  9:05 AM   Infiltration Assessment 0 08/29/2016  9:05 AM   Dressing Status Clean, dry, & intact 08/29/2016  9:05 AM   Dressing Type Transparent 08/29/2016  9:05 AM   Hub Color/Line Status Pink 08/29/2016  9:05 AM        Opportunity for questions and clarification was provided.      Patient transported with:   [employer]

## 2016-08-29 NOTE — Other (Signed)
Cardiac Cath Lab Recovery Arrival Note:      Kelsey [name] arrived to Cardiac Cath Lab, Recovery Area. Staff introduced to patient. Patient identifiers verified with NAME and DATE OF BIRTH. Procedure verified with patient. Consent forms reviewed and signed by patient or authorized representative and verified. Allergies verified.     Patient and family oriented to department. Patient and family informed of procedure and plan of care.     Questions answered with review. Patient prepped for procedure, per orders from physician, prior to arrival.    Patient on cardiac monitor, non-invasive blood pressure, SPO2 monitor. On RA. Patient is A&Ox 4. Patient reports no pain.     Patient in stretcher, in low position, with side rails up, call bell within reach, patient instructed to call if assistance as needed.    Patient prep in: CCL Recovery Area, [place] [address].   Patient family has pager #   Family in: .   Prep by: V. Barry Dieneswens RN

## 2016-09-04 ENCOUNTER — Ambulatory Visit: Admit: 2016-09-04 | Discharge: 2016-09-04 | Payer: MEDICARE | Attending: Nurse Practitioner | Primary: Internal Medicine

## 2016-09-04 DIAGNOSIS — I2510 Atherosclerotic heart disease of native coronary artery without angina pectoris: Secondary | ICD-10-CM

## 2016-09-04 MED ORDER — CARVEDILOL 25 MG TAB
25 mg | ORAL_TABLET | Freq: Two times a day (BID) | ORAL | 3 refills | Status: DC
Start: 2016-09-04 — End: 2017-08-05

## 2016-09-04 MED ORDER — NITROGLYCERIN 0.4 MG SUBLINGUAL TAB
0.4 mg | SUBLINGUAL | 1 refills | Status: DC | PRN
Start: 2016-09-04 — End: 2019-03-31

## 2016-09-04 NOTE — Progress Notes (Signed)
Ok, will review during office visit today

## 2016-09-04 NOTE — Patient Instructions (Addendum)
Please increase your Carvedilol to 25mg  twice daily  Please check your blood pressure daily (at least one hour after your morning blood pressure medications.)  Keep a written record of your blood pressure readings and call the office in 3 weeks with your readings.  Please discuss your pain management with Dr. Cherly Hensen.  Please call your orthopedic surgeon/Dr. Kump to see if he'd be willing to operate with you on Plavix and Aspirin.

## 2016-09-04 NOTE — Progress Notes (Signed)
[facility]  [phone]    HPI: Kelsey [name], a 65 y.o. year-old who presents for follow up regarding her CAD.    She underwent cardiac cath with Dr. Sheliah HatchWarner on 08/29/16 which showed patent grafts but a 90% diagonal lesion that underwent PCI with DES  Originally she came to us needing cardiac clearance for hip surgery, now she has a new drug-eluding stent  Discussed the possibility that her hip surgery would need to be delayed 6 months so that she may have uninterrupted Plavix therapy  She asked me to follow up with Dr. Dahlia [name] to see if her Plavix could be stopped prior to 6 months for her to have surgery and I told her that I would  Her BP is elevated today at 150/80 which I suspect is related to pain from her hip, she is not checking her BP at home  She says her dyspnea is worse post stent which I suspect is related to her elevated blood pressure  She denies any exertional chest pain  No palpitations, dizziness or syncope  Still has mild LE edema requiring Bumex  Hip pain is constantly about 3/10 but gets up to 7-8/10 with walking   Says tramadol never worked for her and is managed by Dr. Cherly HensenPahle  Never had any chest pain in the past, her angina equivalent was jaw pain, has not had any jaw pain recently   Has balance issues, on gabapentin for neuropathy   Still smoking - on Chantix right now, when she comes off it she smokes more   Not exercising regulary, in PT for left hip  Has had short term memory issues since her CABG/AVR  Difficulty with word finding, cognitive abilities are more limited  Has a hard time remembering people's names  Saw neurology/Dr. Lurena Nidaonaldson and [facility] but [facility] and had no further evaluation   Encouraged her to follow back up with Dr. Lurena Nidaonaldson for evaluation    Good friend of Kelsey [name] who passed away, former patient here    Assessment/Plan:   1.  CAD - hx of MI and PCI x 3, had 3 vessel CABG 06/08/2012 by Dr. Bary RichardBundy, s/p PCI/DES to diagonal 08/29/16, continue ASA, Plavix, Coreg and statin  -given Rx for NTG SL tabs PRN chest pain  -has first cardiac rehab visit scheduled, will follow up with Dr. Dahlia [name] in 6 weeks  2.  Severe AS - s/p bioprosthetic bovine AVR 06/08/2012, stable by TTE 8/17   3.  Carotid stenosis - right 50-69% stenosis, left 10-49% stenosis, on ASA and statin  4.  PAD s/p stent in distal right SFA, stent in left common iliac artery - last ABI 12/16 showed right 0.82 and left 0.88, on ASA and statin, encouraged her to quit smoking  5.  Dyslipidemia - LDL 75, TG 282 in 6/17, on zetia and pravastatin 40mg  daily, had myalgias on fenofibrate, will check NMR in December 2017  6.  DM Type 2 - Hgb A1c 6.3%, on insulin, followed by Dr. Cherly HensenPahle  7.  HTN - elevated likely due to pain, advised her to discuss pain management with Dr. Cherly HensenPahle, will increase her Coreg to 25mg  BID for better BP control, asked her to check BP readings daily and send readings to office for review in 3 weeks  8.  Chronic pain/arthritis - needs hip surgery with Dr. Jola [name]/orthopedics, advised her to call his office and inquire  as to whether or not he would do surgery with her on ASA and Plavix, asked her to discuss her pain management regimen per Dr. Cherly Hensen since she continues to have significant hip pain  9.  Vitamin D deficiency - now on Vitamin D 2000 units daily, discussed maintaining a normal Vitamin D level to help prevent myalgias with statins at last visit   10.  RBBB - chronic  11.  Hyponatremia - Na 130, will recheck BMP in December 2017    Cardiac Cath 9/17 - Patent LIMA to LAD, SVG's to RCA and LCX.  Very large diagonal has eccentric 90% stenosis, treated with 2.75 x 12 Xience DES good result.  Bioprosthetic AV not crossed given recent echo.  Echo 8/17 - LVEF 55-60%, no WMA, wall thickness was increased, dilated LA, atrial septum thickened, mildly dilated RA, mild MR   Carotid Duplex 8/17 -  50-79% R ICA stenosis, 10-49% stenosis of the L ICA, >50% stenosis of the left ECA  ABI 12/16 - right 0.82 and left 0.88  Echo 6/16 - LVEF 55%, no WMA, bovine AVR with normal function, no AI, mild AS, no pericardial effusion, grd 1 dd, MAC with calcified chords noted with mild sclerosis, mild MR, trace TR  Carotid duplex 6/16 - right 50-69% prox ICA stenosis with moderate hemodynamic compromise, left 1-49% prox ICA stenosis with mild hemodynamic compromise, no change compared to 2014  ABI 12/15 - right 0.70 and left 0.88  Peripheral angiogram 5/15 - stent in distal right SFA, stent in left common iliac artery  ABI 5/15 - right 0.68 and left 0.88  Abd CTA 5/15 - significant PVD, 80% proximal stenosis in celiac plexus, abdominal aorta with diffuse calcification and mural thrombus, mild disease in right and left renal arteries, moderate right iliac disease with CFA stenosis, SFA with moderate disease, left common iliac with ulcerated plaque proximally, moderate to severe disease in iliac system, SFA with moderate to severe diffuse disease  Echo 09/2012 - LVEF 45-50%, AVR ok, mild MR, mild TR  06/08/12 -CABG x 3 (LIMA to LAD, SVG to OM1, SVG to PLB) and 21 mm [facility] Ease pericardial prosthesis at [facility]  Cardiac Cath 06/04/2012 - mod AS (30mm gradient), mod AI, diffuse 3 vessel CAD  Cardiac Cath 05/2011 - LVEF 30%, occluded mid RCA, high grade stenosis distal LCx, mild AS s/p PCI/DES to LCx by Dr. Oscar La  Cardiac Cath 02/2009 - patent stents in RCA and LCx  PCI to LCx 2001  MI/PCI to RCA 2000    Soc Hx: smokes 1/4 ppd, drinks 2 glasses of wine/month, no drug use  Fam Hx: father had CVA at age 68, had a couple of MIs in his 10s and had arteries replaced with teflon, mother passed at age 37 from aortic aneurysm, brother passed age 74 from sepsis and had bad PVD with multiple bypass surgeries    She  has a past medical history of Arthritis; CAD (coronary artery  disease); Cervical spondylolysis; Chronic pain; COPD; Diabetes (HCC); GERD (gastroesophageal reflux disease); Goiter (04/20/2012); Hypertension; and Neuropathy (HCC) (04/06/2013).    Cardiovascular ROS: no chest pain, positive for dyspnea on exertion  Respiratory ROS: no cough or wheezing  Neurological ROS: no TIA or stroke symptoms  All other systems negative except as above.     PE  Vitals:    09/04/16 1021   BP: 150/80   Pulse: 70   SpO2: 98%   Weight: 202 lb (91.6 kg)   Height: 5'  6" (1.676 m)    Body mass index is 32.6 kg/(m^2).   General appearance - alert, well appearing, and in no distress  Mental status - affect appropriate to mood  Eyes - sclera anicteric, moist mucous membranes  Neck - supple, bilateral carotid bruits   Lymphatics - not assessed  Chest - clear to auscultation, no wheezes, rales or rhonchi  Heart - normal rate, regular rhythm, normal S1, S2, 3/6 SEM    Abdomen - soft, nontender, nondistended, no masses or organomegaly  Back exam - full range of motion, no tenderness  Neurological - cranial nerves II through XII grossly intact, no focal deficit  Musculoskeletal - no muscular tenderness noted, normal strength  Extremities - peripheral pulses diminished, trace pedal edema  Skin - normal coloration  no rashes    12 lead ECG: NSR with RBBB, non-specific ST-T wave changes     Recent Labs:  Lab Results   Component Value Date/Time    Cholesterol, total 240 03/15/2011 03:00 AM    HDL Cholesterol 41 03/15/2011 03:00 AM    LDL, calculated 141.6 03/15/2011 03:00 AM    Triglyceride 287 03/15/2011 03:00 AM    CHOL/HDL Ratio 5.9 03/15/2011 03:00 AM     Lab Results   Component Value Date/Time    Creatinine 0.93 08/22/2016 12:12 PM     Lab Results   Component Value Date/Time    BUN 19 08/22/2016 12:12 PM     Lab Results   Component Value Date/Time    Potassium 4.7 08/22/2016 12:12 PM     No results found for: HBA1C, HGBE8, HBA1CEXT, HBA1CEXT  Lab Results   Component Value Date/Time     HGB 11.5 08/22/2016 12:12 PM     Lab Results   Component Value Date/Time    PLATELET 298 08/22/2016 12:12 PM       Reviewed:  Past Medical History:   Diagnosis Date   ??? Arthritis    ??? CAD (coronary artery disease)    ??? Cervical spondylolysis    ??? Chronic pain    ??? COPD    ??? Diabetes (HCC)    ??? GERD (gastroesophageal reflux disease)    ??? Goiter 04/20/2012   ??? Hypertension    ??? Neuropathy (HCC) 04/06/2013     History   Smoking Status   ??? Current Every Day Smoker   ??? Packs/day: 0.30   ??? Years: 45.00   ??? Types: Cigarettes   Smokeless Tobacco   ??? Never Used     Comment: passed ready     History   Alcohol Use   ??? Yes     Comment: may be 3 times a year if that     Allergies   Allergen Reactions   ??? Morphine Anaphylaxis     Tolerates oxycodone without problem   ??? Avalide [Irbesartan-Hydrochlorothiazide] Myalgia   ??? Demerol [Meperidine] Other (comments)     Makes her feel crazy   ??? Pcn [Penicillins] Swelling   ??? Statins-Hmg-Coa Reductase Inhibitors Myalgia   ??? Sulfa (Sulfonamide Antibiotics) Unknown (comments)   ??? Tricor [Fenofibrate Micronized] Myalgia       Current Outpatient Prescriptions   Medication Sig   ??? nabumetone (RELAFEN) 500 mg tablet Take 500 mg by mouth two (2) times a day.   ??? clopidogrel (PLAVIX) 75 mg tab Take 1 Tab by mouth daily for 360 days.   ??? CHANTIX CONTINUING MONTH BOX 1 mg tablet TAKE 1 TABLET BY MOUTH TWICE A DAY   ???  carvedilol (COREG) 12.5 mg tablet Take 1 Tab by mouth two (2) times daily (with meals).   ??? methocarbamol (ROBAXIN) 500 mg tablet Take 1,000 mg by mouth two (2) times a day. Patient take by mouth as needed   ??? Cholecalciferol, Vitamin D3, (VITAMIN D3) 2,000 unit cap capsule One capsule by mouth daily   ??? aspirin delayed-release 81 mg tablet Take 1 Tab by mouth daily.   ??? insulin detemir (LEVEMIR FLEXTOUCH) 100 unit/mL (3 mL) inpn 38 Units by SubCUTAneous route nightly.   ??? VESICARE 10 mg tablet TAKE 1 TAB BY MOUTH DAILY.    ??? metFORMIN ER (GLUCOPHAGE XR) 500 mg tablet TAKE 2 TABLETS BY MOUTH IN THE MORNING WITH BREAKFAST AND 3 TABLETS BY MOUTH AT BEDTIME (Patient taking differently: TAKE 2 TABLETS BY MOUTH TWICE DAILY)   ??? ezetimibe (ZETIA) 10 mg tablet TAKE 1 TAB BY MOUTH NIGHTLY.   ??? DULoxetine (CYMBALTA) 60 mg capsule TAKE 1 CAPSULE BY MOUTH DAILY   ??? pantoprazole (PROTONIX) 40 mg tablet TAKE 1 TABLET BY MOUTH DAILY   ??? gabapentin (NEURONTIN) 300 mg capsule TAKE 3 CAPSULES BY MOUTH 3 TIMES A DAY (Patient taking differently: take 4 caps at bedtime)   ??? triamcinolone acetonide (KENALOG) 0.1 % topical cream Apply  to affected area daily.   ??? pravastatin (PRAVACHOL) 40 mg tablet Take 1 Tab by mouth nightly.   ??? traMADol (ULTRAM) 50 mg tablet Take 2 Tabs by mouth every six (6) hours as needed for Pain.   ??? bumetanide (BUMEX) 2 mg tablet Take 2 mg by mouth daily.     No current facility-administered medications for this visit.        Renita Papa, NP  [facility]   [address]  [place], [place] [address]  [phone]

## 2016-09-05 NOTE — Progress Notes (Signed)
Please tell her I discussed her stent and plavix with Dr. Dahlia Client and because of the type of stent she got she may come off her Plavix at 3 months for a hip surgery.

## 2016-09-05 NOTE — Telephone Encounter (Signed)
Please tell her I discussed her stent and plavix with Dr. Dahlia Client and because of the type of stent she got she may come off her Plavix at 3 months for a hip surgery.      Called patient, verified identity. Informed patient of the above information per [facility].

## 2016-09-23 ENCOUNTER — Encounter

## 2016-09-23 MED ORDER — NABUMETONE 500 MG TAB
500 mg | ORAL_TABLET | ORAL | 1 refills | Status: DC
Start: 2016-09-23 — End: 2016-10-16

## 2016-09-23 NOTE — Telephone Encounter (Signed)
09/23/2016 Cardiac Wellness: Called [name] to remind of intake appointment on Wednesday, September 24, 2016. Left voicemail message. Provided pt with contact information for [facility]. Also, reminded pt to bring a list of current medications, a personal schedule, and to wear comfortable clothes and shoes. Kelsey CraverAmanda H Murray

## 2016-09-24 ENCOUNTER — Inpatient Hospital Stay: Admit: 2016-09-24 | Discharge: 2016-09-24 | Payer: MEDICARE | Primary: Internal Medicine

## 2016-09-24 DIAGNOSIS — Z9861 Coronary angioplasty status: Secondary | ICD-10-CM

## 2016-09-24 NOTE — Other (Signed)
[name]  65 y.o.  With diagnosis of stent, 08/29/16, attended phase II cardiac rehab for 1 session on 09/24/16.  She is not able to return to cardiac rehab due to hip pain and possible hip surgery.  She has been discharged from our program.    Stanford BreedMary D Gatha Mcnulty, RN  01/29/2017

## 2016-09-24 NOTE — Other (Signed)
Ladell HeadsDiana H Frickey  65 y.o. presented to cardiac wellness for orientation and exercise tolerance test today with a primary diagnosis of stent, 08/29/16.  Her EF is 55-60%.  Ladell HeadsDiana H Letendre has a cardiac history of MI 2000, PCI 200,2001 and 2012, CABG/AVR 2013, and PAD with stent to rt SFA.  She [facility] cardiac [facility] in 2013.  Cardiac risk factors include smoking/ tobacco exposure, family history, dyslipidemia, diabetes mellitus, obesity, hypertension, stress, prior MI, valvular heart disease, inactivity and these were reviewed with "Diane".  She is actively trying to stop smoking and is on Chantix, quit date set for 09/27/16.  I encouraged her to try our text based cessation program, information given.    Ladell HeadsDiana H Seats lives with her spouse.  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    CES-D, depression score, is 35 and this is considered very high. The result was discussed with patient who affirms score to be accurate.  Dr. Cherly HensenPahle was notified via fax of her high score, she is currently taking Cymbalta.  Diane has chronic pain, hip surgery was delayed because of having another stent, she also needs to have knee surgery.  She is currently attending PT and will check with Dr. Cherly HensenPahle, 09/30/16, as to whether to cont. PT or come to cardiac rehab, as insurance will not cover both.  She will call and let us know her plans.  Patient denied chest pain or SOB during 6 minute walk (moditified - test preformed on Scifit due to her limited mobility and pain) and was in SB/SR.   An education manual was given and exercise, nutrition, medications and stress management were reviewed briefly with Diane.  I encouraged her to read the rest of the manual at home, especially if she is not able to return and complete classes.    Stanford BreedMary D Monik Lins, RN  09/24/2016

## 2016-09-30 ENCOUNTER — Ambulatory Visit: Admit: 2016-09-30 | Payer: MEDICARE | Attending: Internal Medicine | Primary: Internal Medicine

## 2016-09-30 DIAGNOSIS — IMO0002 Reserved for concepts with insufficient information to code with codable children: Secondary | ICD-10-CM

## 2016-09-30 LAB — AMB POC LIPID PROFILE
Cholesterol (POC): 198 mg/dL (ref 100–199)
HDL Cholesterol (POC): 51 mg/dL (ref 35–150)
LDL Cholesterol (POC): 81 mg/dL (ref 0–129)
Non-HDL Goal (POC): 147
TChol/HDL Ratio (POC): 3.9 CALC (ref 0.0–5.0)
Triglycerides (POC): 330 mg/dL — AB (ref 0–150)

## 2016-09-30 LAB — AMB POC URINALYSIS DIP STICK MANUAL W/O MICRO
Bilirubin (UA POC): NEGATIVE
Blood (UA POC): NEGATIVE
Glucose (UA POC): NEGATIVE
Ketones (UA POC): NEGATIVE
Leukocyte esterase (UA POC): NEGATIVE
Nitrites (UA POC): NEGATIVE
Protein (UA POC): NEGATIVE mg/dL
Specific gravity (UA POC): 1.005 (ref 1.001–1.035)
Urobilinogen (UA POC): 0.2 (ref 0.2–1)
pH (UA POC): 5.5 (ref 4.6–8.0)

## 2016-09-30 LAB — AMB POC HEMOGLOBIN A1C: Hemoglobin A1c (POC): 6.1 % — AB (ref 4.8–5.6)

## 2016-09-30 MED ORDER — PRAVASTATIN 40 MG TAB
40 mg | ORAL_TABLET | Freq: Every evening | ORAL | 4 refills | Status: DC
Start: 2016-09-30 — End: 2017-09-23

## 2016-09-30 NOTE — ACP (Advance Care Planning) (Signed)
Advance Care Planning    Advance Care Planning (ACP) Provider Conversation Snapshot    Date of ACP Conversation: 09/30/16  Persons included in Conversation:  patient  Length of ACP Conversation in minutes:  16 minutes    Authorized Management consultantDecision Maker (if patient is incapable of making informed decisions):   This person is:   Education officer, museumHealthcare Agent/Medical Power of Attorney under Advance Directive          For Patients with Decision Making Capacity:   Values/Goals: Exploration of values, goals, and preferences if recovery is not expected, even with continued medical treatment in the event of:  Imminent death  Severe, permanent brain injury    Conversation Outcomes / Follow-Up Plan:   Recommended completion of Advance Directive form after review of ACP materials and conversation with prospective healthcare agent   Referral made for ACP follow-up assistance to:  other provider

## 2016-09-30 NOTE — ACP (Advance Care Planning) (Signed)
Advance Care Planning    Advance Care Planning (ACP) Provider Conversation Snapshot    Date of ACP Conversation: 09/30/16  Persons included in Conversation:  patient  Length of ACP Conversation in minutes:  16 minutes    Authorized Decision Maker (if patient is incapable of making informed decisions):   This person is:   Healthcare Agent/Medical Power of Attorney under Advance Directive          For Patients with Decision Making Capacity:   Values/Goals: Exploration of values, goals, and preferences if recovery is not expected, even with continued medical treatment in the event of:  Imminent death  Severe, permanent brain injury    Conversation Outcomes / Follow-Up Plan:   Recommended completion of Advance Directive form after review of ACP materials and conversation with prospective healthcare agent   Referral made for ACP follow-up assistance to:  other provider

## 2016-09-30 NOTE — Progress Notes (Signed)
This is an Initial Medicare Annual Wellness Exam (AWV) (Performed 12 months after IPPE or effective date of Medicare Part B enrollment, Once in a lifetime)    I have reviewed the patient's medical history in detail and updated the computerized patient record.     History     Past Medical History:   Diagnosis Date   ??? Arthritis    ??? CAD (coronary artery disease)    ??? Cervical spondylolysis    ??? Chronic pain    ??? COPD    ??? Diabetes (HCC)    ??? GERD (gastroesophageal reflux disease)    ??? Goiter 04/20/2012   ??? Hypertension    ??? Neuropathy 04/06/2013      Past Surgical History:   Procedure Laterality Date   ??? 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 CARDIAC SURG PROCEDURE UNLIST      PTCA   ??? HX KNEE ARTHROSCOPY Right     X2   ??? HX OTHER SURGICAL  1970    GANGLION CYST   ??? HX TONSILLECTOMY  1963     Current Outpatient Prescriptions   Medication Sig Dispense Refill   ??? pravastatin (PRAVACHOL) 40 mg tablet Take 1 Tab by mouth nightly. 90 Tab 4   ??? GUAIFENESIN (MUCINEX PO) Take  by mouth.     ??? nabumetone (RELAFEN) 500 mg tablet TAKE 1 TAB BY MOUTH TWO (2) TIMES A DAY. 30 Tab 1   ??? carvedilol (COREG) 25 mg tablet Take 1 Tab by mouth two (2) times a day. 180 Tab 3   ??? nitroglycerin (NITROSTAT) 0.4 mg SL tablet 1 Tab by SubLINGual route every five (5) minutes as needed for Chest Pain for up to 3 doses. 1 Bottle 1   ??? nabumetone (RELAFEN) 500 mg tablet Take 500 mg by mouth two (2) times a day.     ??? clopidogrel (PLAVIX) 75 mg tab Take 1 Tab by mouth daily for 360 days. 30 Tab 11   ??? CHANTIX CONTINUING MONTH BOX 1 mg tablet TAKE 1 TABLET BY MOUTH TWICE A DAY 56 Tab 3   ??? methocarbamol (ROBAXIN) 500 mg tablet Take 1,000 mg by mouth two (2) times a day. Patient take by mouth as needed     ??? Cholecalciferol, Vitamin D3, (VITAMIN D3) 2,000 unit cap capsule One capsule by mouth daily 90 Cap 3   ??? aspirin delayed-release 81 mg tablet Take 1 Tab by mouth daily. 90 Tab 3    ??? insulin detemir (LEVEMIR FLEXTOUCH) 100 unit/mL (3 mL) inpn 38 Units by SubCUTAneous route nightly.     ??? VESICARE 10 mg tablet TAKE 1 TAB BY MOUTH DAILY. 90 Tab 4   ??? metFORMIN ER (GLUCOPHAGE XR) 500 mg tablet TAKE 2 TABLETS BY MOUTH IN THE MORNING WITH BREAKFAST AND 3 TABLETS BY MOUTH AT BEDTIME (Patient taking differently: TAKE 2 TABLETS BY MOUTH TWICE DAILY) 450 Tab 3   ??? ezetimibe (ZETIA) 10 mg tablet TAKE 1 TAB BY MOUTH NIGHTLY. 90 Tab 3   ??? DULoxetine (CYMBALTA) 60 mg capsule TAKE 1 CAPSULE BY MOUTH DAILY 90 Cap 2   ??? pantoprazole (PROTONIX) 40 mg tablet TAKE 1 TABLET BY MOUTH DAILY 90 Tab 3   ??? gabapentin (NEURONTIN) 300 mg capsule TAKE 3 CAPSULES BY MOUTH 3 TIMES A DAY (Patient taking differently: take 2 tabs at 3 pm and 3 tabs at bedtime) 810 Cap 0   ??? triamcinolone acetonide (KENALOG) 0.1 % topical cream  Apply  to affected area daily. 15 g 0   ??? traMADol (ULTRAM) 50 mg tablet Take 2 Tabs by mouth every six (6) hours as needed for Pain. 200 Tab 1   ??? bumetanide (BUMEX) 2 mg tablet Take 2 mg by mouth daily.       Allergies   Allergen Reactions   ??? Morphine Anaphylaxis     Tolerates oxycodone without problem   ??? Avalide [Irbesartan-Hydrochlorothiazide] Myalgia   ??? Demerol [Meperidine] Other (comments)     Makes her feel crazy   ??? Pcn [Penicillins] Swelling   ??? Prinivil [Lisinopril] Cough   ??? Statins-Hmg-Coa Reductase Inhibitors Myalgia   ??? Sulfa (Sulfonamide Antibiotics) Unknown (comments)   ??? Tricor [Fenofibrate Micronized] Myalgia     Family History   Problem Relation Age of Onset   ??? Lung Disease Mother    ??? Stroke Father    ??? Lung Disease Brother      COPD   ??? Hypertension Brother    ??? Cancer Brother      PROSTATE   ??? Heart Disease Brother    ??? Diabetes Brother    ??? Anesth Problems Neg Hx      Social History   Substance Use Topics   ??? Smoking status: Current Every Day Smoker     Packs/day: 0.30     Years: 45.00     Types: Cigarettes   ??? Smokeless tobacco: Never Used      Comment: passed ready    ??? Alcohol use Yes      Comment: may be 3 times a year if that     Patient Active Problem List   Diagnosis Code   ??? Coronary atherosclerosis of native coronary artery I25.10   ??? Other chest pain R07.89   ??? Other dyspnea and respiratory abnormality R06.09, R09.89   ??? Mixed hyperlipidemia E78.2   ??? Type II diabetes mellitus, uncontrolled (HCC) E11.65   ??? Dyspnea R06.00   ??? Goiter E04.9   ??? Neuropathy G62.9   ??? Long term (current) use of insulin (HCC) Z79.4   ??? Hypertension complicating diabetes (HCC) E11.59, I10       Depression Risk Factor Screening:     PHQ over the last two weeks 09/30/2016   PHQ Not Done -   Little interest or pleasure in doing things Several days   Feeling down, depressed or hopeless Several days   Total Score PHQ 2 2     Alcohol Risk Factor Screening:   You do not drink alcohol or very rarely.      Functional Ability and Level of Safety:     Hearing Loss  Hearing is good.    Activities of Daily Living  The home contains: no safety equipment.  Patient does total self care    Fall Risk  Fall Risk Assessment, last 12 mths 09/30/2016   Able to walk? Yes   Fall in past 12 months? Yes   Fall with injury? -   Number of falls in past 12 months -   Fall Risk Score -       Abuse Screen  Patient is not abused    Cognitive Screening   Evaluation of Cognitive Function:  Has your family/caregiver stated any concerns about your memory: no  Normal    Patient Care Team   Patient Care Team:  Lake Bells, MD as PCP - General (Internal Medicine)  Marcellus Scott, MD (Cardiology)  Renita Papa, NP (Nurse Practitioner)  Assessment/Plan   Education and counseling provided:  Are appropriate based on today's review and evaluation  End-of-Life planning (with patient's consent)    Diagnoses and all orders for this visit:    1. Need for influenza vaccination  -     INFLUENZA VIRUS VACCINE, HIGH DOSE SEASONAL, PRESERVATIVE FREE    2. Initial Medicare annual wellness visit   -     ADVANCE CARE PLANNING FIRST 30 MINS    3. Advanced directives, counseling/discussion  -     ADVANCE CARE PLANNING FIRST 30 MINS    4. Long term (current) use of insulin (HCC)    5. Hypertension complicating diabetes (HCC)    6. Neuropathy    7. Mixed hyperlipidemia  -     pravastatin (PRAVACHOL) 40 mg tablet; Take 1 Tab by mouth nightly.  -     AMB POC LIPID PROFILE  -     PR COLLECTION VENOUS BLOOD,VENIPUNCTURE  -     METABOLIC PANEL, COMPREHENSIVE    8. 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 EYE EXAM RETINAL OR DILATED Q1  12/09/1960   ??? ZOSTER VACCINE AGE 48>  10/09/2010   ??? FOBT Q 1 YEAR AGE 38-75  10/01/2013   ??? BREAST CANCER SCRN MAMMOGRAM  12/06/2016

## 2016-09-30 NOTE — Progress Notes (Signed)
Kelsey [name] is a 65 y.o. female and presents with   Chief Complaint   Patient presents with   ??? Follow-up     4 mos   .  Patient was supposed to have hip surgery and had some pre op testing... cath showed diagonal occlusion.  Had stent placed.  Since then sob improved. Hip pain persists. But she has noted some improvement with  PT  She does not check blood sugars.        Current Outpatient Prescriptions   Medication Sig Dispense Refill   ??? pravastatin (PRAVACHOL) 40 mg tablet Take 1 Tab by mouth nightly. 90 Tab 4   ??? GUAIFENESIN (MUCINEX PO) Take  by mouth.     ??? nabumetone (RELAFEN) 500 mg tablet TAKE 1 TAB BY MOUTH TWO (2) TIMES A DAY. 30 Tab 1   ??? carvedilol (COREG) 25 mg tablet Take 1 Tab by mouth two (2) times a day. 180 Tab 3   ??? nitroglycerin (NITROSTAT) 0.4 mg SL tablet 1 Tab by SubLINGual route every five (5) minutes as needed for Chest Pain for up to 3 doses. 1 Bottle 1   ??? nabumetone (RELAFEN) 500 mg tablet Take 500 mg by mouth two (2) times a day.     ??? clopidogrel (PLAVIX) 75 mg tab Take 1 Tab by mouth daily for 360 days. 30 Tab 11   ??? CHANTIX CONTINUING MONTH BOX 1 mg tablet TAKE 1 TABLET BY MOUTH TWICE A DAY 56 Tab 3   ??? methocarbamol (ROBAXIN) 500 mg tablet Take 1,000 mg by mouth two (2) times a day. Patient take by mouth as needed     ??? Cholecalciferol, Vitamin D3, (VITAMIN D3) 2,000 unit cap capsule One capsule by mouth daily 90 Cap 3   ??? aspirin delayed-release 81 mg tablet Take 1 Tab by mouth daily. 90 Tab 3   ??? insulin detemir (LEVEMIR FLEXTOUCH) 100 unit/mL (3 mL) inpn 38 Units by SubCUTAneous route nightly.     ??? VESICARE 10 mg tablet TAKE 1 TAB BY MOUTH DAILY. 90 Tab 4   ??? metFORMIN ER (GLUCOPHAGE XR) 500 mg tablet TAKE 2 TABLETS BY MOUTH IN THE MORNING WITH BREAKFAST AND 3 TABLETS BY MOUTH AT BEDTIME (Patient taking differently: TAKE 2 TABLETS BY MOUTH TWICE DAILY) 450 Tab 3   ??? ezetimibe (ZETIA) 10 mg tablet TAKE 1 TAB BY MOUTH NIGHTLY. 90 Tab 3    ??? DULoxetine (CYMBALTA) 60 mg capsule TAKE 1 CAPSULE BY MOUTH DAILY 90 Cap 2   ??? pantoprazole (PROTONIX) 40 mg tablet TAKE 1 TABLET BY MOUTH DAILY 90 Tab 3   ??? gabapentin (NEURONTIN) 300 mg capsule TAKE 3 CAPSULES BY MOUTH 3 TIMES A DAY (Patient taking differently: take 2 tabs at 3 pm and 3 tabs at bedtime) 810 Cap 0   ??? triamcinolone acetonide (KENALOG) 0.1 % topical cream Apply  to affected area daily. 15 g 0   ??? traMADol (ULTRAM) 50 mg tablet Take 2 Tabs by mouth every six (6) hours as needed for Pain. 200 Tab 1   ??? bumetanide (BUMEX) 2 mg tablet Take 2 mg by mouth daily.       Allergies   Allergen Reactions   ??? Morphine Anaphylaxis     Tolerates oxycodone without problem   ??? Avalide [Irbesartan-Hydrochlorothiazide] Myalgia   ??? Demerol [Meperidine] Other (comments)     Makes her feel crazy   ??? Pcn [Penicillins] Swelling   ??? Prinivil [Lisinopril] Cough   ???  Statins-Hmg-Coa Reductase Inhibitors Myalgia   ??? Sulfa (Sulfonamide Antibiotics) Unknown (comments)   ??? Tricor [Fenofibrate Micronized] Myalgia     Past Medical History:   Diagnosis Date   ??? Arthritis    ??? CAD (coronary artery disease)    ??? Cervical spondylolysis    ??? Chronic pain    ??? COPD    ??? Diabetes (HCC)    ??? GERD (gastroesophageal reflux disease)    ??? Goiter 04/20/2012   ??? Hypertension    ??? Neuropathy 04/06/2013     Past Surgical History:   Procedure Laterality Date   ??? 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 CARDIAC SURG PROCEDURE UNLIST      PTCA   ??? HX KNEE ARTHROSCOPY Right     X2   ??? HX OTHER SURGICAL  1970    GANGLION CYST   ??? HX TONSILLECTOMY  1963     Family History   Problem Relation Age of Onset   ??? Lung Disease Mother    ??? Stroke Father    ??? Lung Disease Brother      COPD   ??? Hypertension Brother    ??? Cancer Brother      PROSTATE   ??? Heart Disease Brother    ??? Diabetes Brother    ??? Anesth Problems Neg Hx      Social History   Substance Use Topics   ??? Smoking status: Current Every Day Smoker     Packs/day: 0.30     Years: 45.00      Types: Cigarettes   ??? Smokeless tobacco: Never Used      Comment: passed ready   ??? Alcohol use Yes      Comment: may be 3 times a year if that      The patient does not have a history of falls. A plan of care for falls was documented..  Depression screen pos- treated      Objective:  Visit Vitals   ??? BP 180/80   ??? Ht 5\' 6"  (1.676 m)   ??? Wt 207 lb (93.9 kg)   ??? BMI 33.41 kg/m2     wdwm 65 yo wf  In NAD. A&O.  HEENT -- Pupils round.  O/P Clear.  Neck -- Supple. No JVD.  Heart -- RRR. No R/M/G.  Lungs -- CTA.  Abdomen -- Soft. Non-tender. Non-distended. No masses. Bowel sounds present.  Extremities -- No edema. Poor pulses, no lesions, good sensation on right. Dec on left.      Results for orders placed or performed in visit on 09/30/16   AMB POC URINALYSIS DIP STICK MANUAL W/O MICRO   Result Value Ref Range    Color (UA POC) Yellow     Clarity (UA POC) Clear     Glucose (UA POC) Negative Negative    Bilirubin (UA POC) Negative Negative    Ketones (UA POC) Negative Negative    Specific gravity (UA POC) 1.005 1.001 - 1.035    Blood (UA POC) Negative Negative    pH (UA POC) 5.5 4.6 - 8.0    Protein (UA POC) Negative Negative mg/dL    Urobilinogen (UA POC) 0.2 mg/dL 0.2 - 1    Nitrites (UA POC) Negative Negative    Leukocyte esterase (UA POC) Negative Negative   AMB POC HEMOGLOBIN A1C   Result Value Ref Range    Hemoglobin A1c (POC) 6.1 (A) 4.8 - 5.6 %   AMB POC LIPID  PROFILE   Result Value Ref Range    Cholesterol (POC) 198 100 - 199 mg/dL    Triglycerides (POC) 330 (A) 0 - 150 mg/dL    HDL Cholesterol (POC) 51 35 - 150 mg/dL    LDL Cholesterol (POC) 81 0 - 129 mg/dL    Non-HDL Goal (POC) 960147     TChol/HDL Ratio (POC) 3.9 0.0 - 5.0 CALC         Assessment/Plan:    ICD-10-CM ICD-9-CM    1. 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 Need for influenza vaccination Z23 V04.81 INFLUENZA VIRUS VACCINE, HIGH DOSE SEASONAL, PRESERVATIVE FREE   3. Initial Medicare annual wellness visit Z00.00 V70.0 ADVANCE CARE PLANNING FIRST 30 MINS   4. Advanced directives, counseling/discussion Z71.89 V65.49 ADVANCE CARE PLANNING FIRST 30 MINS   5. Long term (current) use of insulin (HCC) Z79.4 V58.67    6. Hypertension complicating diabetes (HCC) E11.59 250.80     I10 401.9    7. Neuropathy G62.9 355.9    8. Mixed hyperlipidemia E78.2 272.2 pravastatin (PRAVACHOL) 40 mg tablet      AMB POC LIPID PROFILE      PR COLLECTION VENOUS BLOOD,VENIPUNCTURE      METABOLIC PANEL, COMPREHENSIVE     Monitor blood pressure at home.      Author:  Lake BellsNancy J Delise Simenson, MD 09/30/2016 3:18 PM

## 2016-09-30 NOTE — Patient Instructions (Addendum)
Medicare Wellness Visit, Female    The best way to live healthy is to have a healthy lifestyle by eating a well-balanced diet, exercising regularly, limiting alcohol and stopping smoking.    Regular physical exams and screening tests are another way to keep healthy. Preventive exams provided by your health care provider can find health problems before they become diseases or illnesses. Preventive services including immunizations, screening tests, monitoring and exams can help you take care of your own health.    All people over age 66 should have a pneumovax  and and a prevnar shot to prevent pneumonia. These are once in a lifetime unless you and your provider decide differently.    All people over 65 should have a yearly flu shot and a tetanus vaccine every 10 years.    A bone mass density to screen for osteoporosis or thinning of the bones should be done every 2 years after 65.    Screening for diabetes mellitus with a blood sugar test should be done every year.    Glaucoma is a disease of the eye due to increased ocular pressure that can lead to blindness and it should be done every year by an eye professional.    Cardiovascular screening tests that check for elevated lipids (fatty part of blood) which can lead to heart disease and strokes should be done every 5 years.    Colorectal screening that evaluates for blood or polyps in your colon should be done yearly as a stool test or every five years as a flexible sigmoidoscope or every 10 years as a colonoscopy up to age 66.    Breast cancer screening with a mammogram is recommended biennially  for women age 66-74.    Screening for cervical cancer with a pap smear and pelvic exam is recommended for women after age 66 years every 2 years up to age 66 or when the provider and patient decide to stop.If there is a history of cervical abnormalities or other increased risk for cancer then the test is recommended yearly.     Hepatitis C screening is also recommended for anyone born between [id number] through 1965.    A shingles vaccine is also recommended once in a lifetime after age 66.    Your Medicare Wellness Exam is recommended annually.    Here is a list of your current Health Maintenance items with a due date:  Health Maintenance Due   Topic Date Due   ??? Eye Exam  12/09/1960   ??? Shingles Vaccine  10/09/2010   ??? Stool testing for trace blood  10/01/2013   ??? Annual Well Visit  12/10/2015   ??? Flu Vaccine  07/22/2016   ??? Breast Cancer Screening  12/06/2016     Monitor bp  Results for orders placed or performed in visit on 09/30/16   AMB POC URINALYSIS DIP STICK MANUAL W/O MICRO   Result Value Ref Range    Color (UA POC) Yellow     Clarity (UA POC) Clear     Glucose (UA POC) Negative Negative    Bilirubin (UA POC) Negative Negative    Ketones (UA POC) Negative Negative    Specific gravity (UA POC) 1.005 1.001 - 1.035    Blood (UA POC) Negative Negative    pH (UA POC) 5.5 4.6 - 8.0    Protein (UA POC) Negative Negative mg/dL    Urobilinogen (UA POC) 0.2 mg/dL 0.2 - 1    Nitrites (UA POC) Negative Negative  Leukocyte esterase (UA POC) Negative Negative   AMB POC HEMOGLOBIN A1C   Result Value Ref Range    Hemoglobin A1c (POC) 6.1 (A) 4.8 - 5.6 %   AMB POC LIPID PROFILE   Result Value Ref Range    Cholesterol (POC) 198 100 - 199 mg/dL    Triglycerides (POC) 330 (A) 0 - 150 mg/dL    HDL Cholesterol (POC) 51 35 - 150 mg/dL    LDL Cholesterol (POC) 81 0 - 129 mg/dL    Non-HDL Goal (POC) 657     TChol/HDL Ratio (POC) 3.9 0.0 - 5.0 CALC

## 2016-10-01 LAB — METABOLIC PANEL, COMPREHENSIVE
A-G Ratio: 1.6 (ref 1.2–2.2)
ALT (SGPT): 11 IU/L (ref 0–32)
AST (SGOT): 14 IU/L (ref 0–40)
Albumin: 3.9 g/dL (ref 3.6–4.8)
Alk. phosphatase: 86 IU/L (ref 39–117)
BUN/Creatinine ratio: 21 (ref 12–28)
BUN: 16 mg/dL (ref 8–27)
Bilirubin, total: <0.2 mg/dL (ref 0.0–1.2)
CO2: 26 mmol/L (ref 18–29)
Calcium: 9.1 mg/dL (ref 8.7–10.3)
Chloride: 91 mmol/L — ABNORMAL LOW (ref 96–106)
Creatinine: 0.78 mg/dL (ref 0.57–1.00)
GFR est AA: 92 mL/min/{1.73_m2} (ref >59)
GFR est non-AA: 80 mL/min/{1.73_m2} (ref >59)
GLOBULIN, TOTAL: 2.4 g/dL (ref 1.5–4.5)
Glucose: 74 mg/dL (ref 65–99)
Potassium: 4.6 mmol/L (ref 3.5–5.2)
Protein, total: 6.3 g/dL (ref 6.0–8.5)
Sodium: 131 mmol/L — ABNORMAL LOW (ref 134–144)

## 2016-10-06 NOTE — Telephone Encounter (Signed)
10/06/2016  Called [name] to speak about her appointment with Dr. Cherly HensenPahle to see what she recommended for Kelsey [name] to do, start cardiac rehab or physical therapy. Dr. Cherly HensenPahle would prefer her to start cardiac rehab, but the patient did not have the discussion when she saw Dr. Cherly HensenPahle last week. Kelsey [name] wants to speak with Dr. Cherly HensenPahle  Before she sets up any exercise sessions or starts rehab. Kelsey [name] will give us a call back once she speaks with Dr. Cherly HensenPahle.   Latina CraverAmanda H. Murray

## 2016-10-10 ENCOUNTER — Encounter

## 2016-10-10 MED ORDER — LOSARTAN 25 MG TAB
25 mg | ORAL_TABLET | Freq: Every day | ORAL | 1 refills | Status: DC
Start: 2016-10-10 — End: 2016-12-05

## 2016-10-10 NOTE — Progress Notes (Signed)
pls call- I will send losartan in to pharmacy.

## 2016-10-10 NOTE — Progress Notes (Signed)
Called patient and relayed doctor's message

## 2016-10-10 NOTE — Progress Notes (Signed)
pls call- kidney function good, monitor bp at home,  Continue PT and as much exercise as you can tolerate

## 2016-10-15 DIAGNOSIS — I25110 Atherosclerotic heart disease of native coronary artery with unstable angina pectoris: Secondary | ICD-10-CM

## 2016-10-15 NOTE — ED Provider Notes (Signed)
HPI Comments: 65 y.o. female with past medical history significant for MI x 3, CABG, HTN, DM, COPD, GERD who presents from home via EMS with chief complaint of jaw pain. Pt reports an acute onset of b/l jaw pain tonight around 2030 (3 hours pta). At tiem of onset, pt was seated on the couch, no exertion. The pain was initially 6/10. Pt took 1 Nitro with some relief (current pain: 2/10). She developed a headache s/p Nitro. Pt reports hx of similar symptoms related to MI but at that time she solely had left sided jaw pain. No chest pain with previous MI. Pt also checked her blood pressure which was "215/98". She started a new BP medication yesterday (Losartan). Pt reports that she was recently evaluated by Cardiology in preparation for hip replacement. That visit prompted a cardiac cath 08/29/16 with stent placement. Pt states that she has not felt "quite right" since that cardiac cath. Pt further c/o recent sinus congestion and leg swelling. No diaphoresis, nausea, vomiting, diarrhea, constipation, fatigue, weakness, numbness, SOB, appetite change, urinary sympotms. Pt is on plavix. There are no other acute medical concerns at this time.      PCP: Lake Bells, MD  Cardiology: Meredith Mody, MD    Note written by Terie Purser, Scribe, as dictated by Lanell Persons, MD 1:22 AM    The history is provided by the patient. No language interpreter was used.        Past Medical History:   Diagnosis Date   ??? Acute MI     x3   ??? Arthritis    ??? CAD (coronary artery disease)    ??? Cervical spondylolysis    ??? Chronic pain    ??? COPD    ??? Diabetes (HCC)    ??? GERD (gastroesophageal reflux disease)    ??? Goiter 04/20/2012   ??? Hypertension    ??? Neuropathy 04/06/2013       Past Surgical History:   Procedure Laterality Date   ??? 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 CARDIAC SURG PROCEDURE UNLIST      PTCA   ??? HX KNEE ARTHROSCOPY Right     X2   ??? HX OTHER SURGICAL  1970    GANGLION CYST   ??? HX TONSILLECTOMY  1963          Family History:   Problem Relation Age of Onset   ??? Lung Disease Mother    ??? Stroke Father    ??? Lung Disease Brother      COPD   ??? Hypertension Brother    ??? Cancer Brother      PROSTATE   ??? Heart Disease Brother    ??? Diabetes Brother    ??? Anesth Problems Neg Hx        Social History     Social History   ??? Marital status: MARRIED     Spouse name: N/A   ??? Number of children: N/A   ??? Years of education: N/A     Occupational History   ??? Not on file.     Social History Main Topics   ??? Smoking status: Current Every Day Smoker     Packs/day: 0.30     Years: 45.00     Types: Cigarettes   ??? Smokeless tobacco: Never Used      Comment: passed ready   ??? Alcohol use Yes      Comment: may be 3 times a year  if that   ??? Drug use: No   ??? Sexual activity: Not on file     Other Topics Concern   ??? Not on file     Social History Narrative         ALLERGIES: Morphine; Avalide [irbesartan-hydrochlorothiazide]; Demerol [meperidine]; Pcn [penicillins]; Prinivil [lisinopril]; Statins-hmg-coa reductase inhibitors; Sulfa (sulfonamide antibiotics); and Tricor [fenofibrate micronized]    Review of Systems   Constitutional: Negative for appetite change, diaphoresis, fatigue and fever.   HENT: Positive for congestion.         + Jaw pain (b/l)   Respiratory: Negative for shortness of breath.    Cardiovascular: Positive for leg swelling. Negative for chest pain.   Gastrointestinal: Negative for abdominal pain, constipation, diarrhea, nausea and vomiting.   Genitourinary: Negative for difficulty urinating and dysuria.   Neurological: Negative for weakness and numbness.   All other systems reviewed and are negative.      Vitals:    10/15/16 2322   BP: 191/69   Pulse: 66   Resp: 15   Temp: 98.1 ??F (36.7 ??C)   SpO2: 99%   Weight: 90.3 kg (199 lb)   Height: 5\' 6"  (1.676 m)            Physical Exam   Constitutional: She is oriented to person, place, and time. She appears well-developed and well-nourished.   HENT:   Head: Normocephalic and atraumatic.    Nose: Nose normal.   Eyes: Conjunctivae and EOM are normal. Pupils are equal, round, and reactive to light.   Neck: Normal range of motion. Neck supple.   Cardiovascular: Normal rate, regular rhythm, normal heart sounds and intact distal pulses.    Pulmonary/Chest: Effort normal and breath sounds normal.   Abdominal: Soft. Bowel sounds are normal.   Musculoskeletal: Normal range of motion.   Neurological: She is oriented to person, place, and time. She has normal reflexes.   Skin: Skin is warm and dry.   Psychiatric: She has a normal mood and affect. Her behavior is normal.   Nursing note and vitals reviewed.   Note written by Terie PurserArielle L. Johnson, Scribe, as dictated by Lanell Personsara Cyndia Degraff, MD 1:25 AM    MDM  ED Course       Procedures      ED EKG interpretation:  Rhythm: normal sinus rhythm; and regular . Rate (approx.): 68; Axis: left axis deviation; P wave: normal; QRS interval: prolonged; ST/T wave: non-specific changes; bifascicular block- unchanged from EKG 09/29/13/17This EKG was interpreted by Lanell Personsara Marquesa Rath, MD,ED Provider.    No acute EKG changes, neg initial troponin. Cardiac equivalent jaw pain relieved with nitro.  BP elevated but hasn't taken night time meds. Will medication. Plan for admission for rule out/further eval given extensive disease and recent stent.    CONSULT NOTE:  1:25 AM Lanell Personsara Emberlynn Riggan, MD spoke with Dr. Vaughan BastaPegram, Consult for Hospitalist.  Discussed available diagnostic tests and clinical findings.  Dr. Vaughan BastaPegram will see and admit for further evaluation.

## 2016-10-15 NOTE — ED Triage Notes (Signed)
TRIAGE: Pt arrives via EMS from home with c/o jaw pain starting at 2030 tonight, denies cp, sob, dizziness, diaphoretic. Pt had 1 cardiac stent placement 3 weeks ago by [facility]. 1 nitro sublin taken at home and 324 mg ASA given in route. Hx: 3 MI that were also just jaw pain. Pt newly started on plavix and losartan

## 2016-10-16 ENCOUNTER — Inpatient Hospital Stay
Admit: 2016-10-16 | Discharge: 2016-10-17 | Disposition: A | Discharge status: Home / Self Care | Payer: MEDICARE | Attending: Internal Medicine | Admitting: Internal Medicine

## 2016-10-16 ENCOUNTER — Emergency Department: Admit: 2016-10-16 | Payer: MEDICARE | Primary: Internal Medicine

## 2016-10-16 LAB — EKG 12-LEAD
Atrial Rate: 68 {beats}/min
Diagnosis: NORMAL
P Axis: 67 degrees
P-R Interval: 176 ms
Q-T Interval: 468 ms
QRS Duration: 138 ms
QTc Calculation (Bazett): 497 ms
R Axis: -62 degrees
T Axis: 58 degrees
Ventricular Rate: 68 {beats}/min

## 2016-10-16 LAB — METABOLIC PANEL, COMPREHENSIVE
A-G Ratio: 1.1 (ref 1.1–2.2)
ALT (SGPT): 19 U/L (ref 12–78)
AST (SGOT): 11 U/L — ABNORMAL LOW (ref 15–37)
Albumin: 3.5 g/dL (ref 3.5–5.0)
Alk. phosphatase: 84 U/L (ref 45–117)
Anion gap: 10 mmol/L (ref 5–15)
BUN/Creatinine ratio: 20 (ref 12–20)
BUN: 17 MG/DL (ref 6–20)
Bilirubin, total: 0.2 MG/DL (ref 0.2–1.0)
CO2: 27 mmol/L (ref 21–32)
Calcium: 8.7 MG/DL (ref 8.5–10.1)
Chloride: 100 mmol/L (ref 97–108)
Creatinine: 0.86 MG/DL (ref 0.55–1.02)
GFR est AA: >60 mL/min/{1.73_m2} (ref >60)
GFR est non-AA: >60 mL/min/{1.73_m2} (ref >60)
Globulin: 3.1 g/dL (ref 2.0–4.0)
Glucose: 144 mg/dL — ABNORMAL HIGH (ref 65–100)
Potassium: 4.3 mmol/L (ref 3.5–5.1)
Protein, total: 6.6 g/dL (ref 6.4–8.2)
Sodium: 137 mmol/L (ref 136–145)

## 2016-10-16 LAB — METABOLIC PANEL, BASIC
Anion gap: 8 mmol/L (ref 5–15)
BUN/Creatinine ratio: 24 — ABNORMAL HIGH (ref 12–20)
BUN: 12 MG/DL (ref 6–20)
CO2: 21 mmol/L (ref 21–32)
Calcium: 6.6 MG/DL — ABNORMAL LOW (ref 8.5–10.1)
Chloride: 112 mmol/L — ABNORMAL HIGH (ref 97–108)
Creatinine: 0.5 MG/DL — ABNORMAL LOW (ref 0.55–1.02)
GFR est AA: >60 mL/min/{1.73_m2} (ref >60)
GFR est non-AA: >60 mL/min/{1.73_m2} (ref >60)
Glucose: 91 mg/dL (ref 65–100)
Potassium: 3 mmol/L — ABNORMAL LOW (ref 3.5–5.1)
Sodium: 141 mmol/L (ref 136–145)

## 2016-10-16 LAB — CBC WITH AUTOMATED DIFF
ABS. BASOPHILS: 0 10*3/uL (ref 0.0–0.1)
ABS. BASOPHILS: 0 10*3/uL (ref 0.0–0.1)
ABS. EOSINOPHILS: 0.2 10*3/uL (ref 0.0–0.4)
ABS. EOSINOPHILS: 0.2 10*3/uL (ref 0.0–0.4)
ABS. LYMPHOCYTES: 2.3 10*3/uL (ref 0.8–3.5)
ABS. LYMPHOCYTES: 2.2 10*3/uL (ref 0.8–3.5)
ABS. MONOCYTES: 0.7 10*3/uL (ref 0.0–1.0)
ABS. MONOCYTES: 0.5 10*3/uL (ref 0.0–1.0)
ABS. NEUTROPHILS: 5.4 10*3/uL (ref 1.8–8.0)
ABS. NEUTROPHILS: 4.6 10*3/uL (ref 1.8–8.0)
BASOPHILS: 0 % (ref 0–1)
BASOPHILS: 0 % (ref 0–1)
EOSINOPHILS: 2 % (ref 0–7)
EOSINOPHILS: 3 % (ref 0–7)
HCT: 33 % — ABNORMAL LOW (ref 35.0–47.0)
HCT: 30.5 % — ABNORMAL LOW (ref 35.0–47.0)
HGB: 10.5 g/dL — ABNORMAL LOW (ref 11.5–16.0)
HGB: 9.6 g/dL — ABNORMAL LOW (ref 11.5–16.0)
LYMPHOCYTES: 27 % (ref 12–49)
LYMPHOCYTES: 29 % (ref 12–49)
MCH: 26.4 PG (ref 26.0–34.0)
MCH: 26.3 PG (ref 26.0–34.0)
MCHC: 31.8 g/dL (ref 30.0–36.5)
MCHC: 31.5 g/dL (ref 30.0–36.5)
MCV: 82.9 FL (ref 80.0–99.0)
MCV: 83.6 FL (ref 80.0–99.0)
MONOCYTES: 8 % (ref 5–13)
MONOCYTES: 7 % (ref 5–13)
NEUTROPHILS: 63 % (ref 32–75)
NEUTROPHILS: 61 % (ref 32–75)
PLATELET: 255 10*3/uL (ref 150–400)
PLATELET: 250 10*3/uL (ref 150–400)
RBC: 3.98 M/uL (ref 3.80–5.20)
RBC: 3.65 M/uL — ABNORMAL LOW (ref 3.80–5.20)
RDW: 14.6 % — ABNORMAL HIGH (ref 11.5–14.5)
RDW: 14.9 % — ABNORMAL HIGH (ref 11.5–14.5)
WBC: 8.6 10*3/uL (ref 3.6–11.0)
WBC: 7.6 10*3/uL (ref 3.6–11.0)

## 2016-10-16 LAB — POTASSIUM: Potassium: 4.4 mmol/L (ref 3.5–5.1)

## 2016-10-16 LAB — LIPID PANEL
CHOL/HDL Ratio: 2 (ref 0–5.0)
Cholesterol, total: 90 MG/DL (ref <200)
HDL Cholesterol: 46 MG/DL
LDL, calculated: 18.2 MG/DL (ref 0–100)
Triglyceride: 129 MG/DL (ref <150)
VLDL, calculated: 25.8 MG/DL

## 2016-10-16 LAB — POC EG7
Base excess (POC): 4 mmol/L
Calcium, ionized (POC): 1.22 mmol/L (ref 1.12–1.32)
FIO2 (POC): 21 %
HCO3 (POC): 28.8 MMOL/L — ABNORMAL HIGH (ref 22–26)
Total resp. rate: 20
pCO2 (POC): 48.7 MMHG — ABNORMAL HIGH (ref 35.0–45.0)
pH (POC): 7.38 (ref 7.35–7.45)
pO2 (POC): 24 MMHG — CL (ref 80–100)
sO2 (POC): 40 % — ABNORMAL LOW (ref 92–97)

## 2016-10-16 LAB — EKG, 12 LEAD, INITIAL
Atrial Rate: 68 {beats}/min
Calculated P Axis: 67 degrees
Calculated R Axis: -62 degrees
Calculated T Axis: 58 degrees
Diagnosis: NORMAL
P-R Interval: 176 ms
Q-T Interval: 468 ms
QRS Duration: 138 ms
QTC Calculation (Bezet): 497 ms
Ventricular Rate: 68 {beats}/min

## 2016-10-16 LAB — TROPONIN I
Troponin-I, Qt.: <0.04 ng/mL (ref <0.05)
Troponin-I, Qt.: 0.31 ng/mL — ABNORMAL HIGH (ref <0.05)
Troponin-I, Qt.: 0.71 ng/mL — ABNORMAL HIGH (ref <0.05)

## 2016-10-16 LAB — MAGNESIUM
Magnesium: 1.1 mg/dL — ABNORMAL LOW (ref 1.6–2.4)
Magnesium: 2.6 mg/dL — ABNORMAL HIGH (ref 1.6–2.4)

## 2016-10-16 MED ORDER — NITROGLYCERIN 0.4 MG SUBLINGUAL TAB
0.4 mg | SUBLINGUAL | Status: DC | PRN
Start: 2016-10-16 — End: 2016-10-17
  Administered 2016-10-16: 04:00:00 via SUBLINGUAL

## 2016-10-16 MED ORDER — INSULIN GLARGINE 100 UNIT/ML INJECTION
100 unit/mL | Freq: Every evening | SUBCUTANEOUS | Status: DC
Start: 2016-10-16 — End: 2016-10-16

## 2016-10-16 MED ORDER — PRAVASTATIN 40 MG TAB
40 mg | Freq: Every evening | ORAL | Status: DC
Start: 2016-10-16 — End: 2016-10-17
  Administered 2016-10-16 – 2016-10-17 (×2): via ORAL

## 2016-10-16 MED ORDER — MAGNESIUM SULFATE 2 GRAM/50 ML IVPB
2 gram/50 mL (4 %) | INTRAVENOUS | Status: AC
Start: 2016-10-16 — End: 2016-10-16
  Administered 2016-10-16 (×2): via INTRAVENOUS

## 2016-10-16 MED ORDER — LOSARTAN 25 MG TAB
25 mg | Freq: Every day | ORAL | Status: DC
Start: 2016-10-16 — End: 2016-10-17
  Administered 2016-10-16 – 2016-10-17 (×2): via ORAL

## 2016-10-16 MED ORDER — CARVEDILOL 12.5 MG TAB
12.5 mg | Freq: Two times a day (BID) | ORAL | Status: DC
Start: 2016-10-16 — End: 2016-10-17
  Administered 2016-10-16 – 2016-10-17 (×4): via ORAL

## 2016-10-16 MED ORDER — SODIUM CHLORIDE 0.9 % IJ SYRG
Freq: Three times a day (TID) | INTRAMUSCULAR | Status: DC
Start: 2016-10-16 — End: 2016-10-17
  Administered 2016-10-16 – 2016-10-17 (×4): via INTRAVENOUS

## 2016-10-16 MED ORDER — EZETIMIBE 10 MG TAB
10 mg | Freq: Every day | ORAL | Status: DC
Start: 2016-10-16 — End: 2016-10-17
  Administered 2016-10-16 – 2016-10-17 (×2): via ORAL

## 2016-10-16 MED ORDER — CARVEDILOL 12.5 MG TAB
12.5 mg | ORAL | Status: AC
Start: 2016-10-16 — End: 2016-10-16
  Administered 2016-10-16: 06:00:00 via ORAL

## 2016-10-16 MED ORDER — POTASSIUM CHLORIDE 20 MEQ/50 ML IV PIGGY BACK
20 mEq/50 mL | Freq: Once | INTRAVENOUS | Status: DC
Start: 2016-10-16 — End: 2016-10-16

## 2016-10-16 MED ORDER — SODIUM CHLORIDE 0.9 % IJ SYRG
INTRAMUSCULAR | Status: DC | PRN
Start: 2016-10-16 — End: 2016-10-17

## 2016-10-16 MED ORDER — POTASSIUM CHLORIDE 10 MEQ/50 ML IV PIGGY BACK
10 mEq/50 mL | INTRAVENOUS | Status: AC
Start: 2016-10-16 — End: 2016-10-16
  Administered 2016-10-16 (×2): via INTRAVENOUS

## 2016-10-16 MED ORDER — CLOPIDOGREL 75 MG TAB
75 mg | Freq: Every day | ORAL | Status: DC
Start: 2016-10-16 — End: 2016-10-17
  Administered 2016-10-16 – 2016-10-17 (×2): via ORAL

## 2016-10-16 MED ORDER — OXYBUTYNIN CHLORIDE SR 5 MG 24 HR TAB
5 mg | Freq: Every day | ORAL | Status: DC
Start: 2016-10-16 — End: 2016-10-17
  Administered 2016-10-16 – 2016-10-17 (×2): via ORAL

## 2016-10-16 MED ORDER — ACETAMINOPHEN 325 MG TABLET
325 mg | ORAL | Status: AC
Start: 2016-10-16 — End: 2016-10-16
  Administered 2016-10-16: 04:00:00 via ORAL

## 2016-10-16 MED ORDER — LOSARTAN 50 MG TAB
50 mg | ORAL | Status: AC
Start: 2016-10-16 — End: 2016-10-16
  Administered 2016-10-16: 06:00:00 via ORAL

## 2016-10-16 MED ORDER — CHOLECALCIFEROL (VITAMIN D3) 1,000 UNIT (25 MCG) TAB
Freq: Every day | ORAL | Status: DC
Start: 2016-10-16 — End: 2016-10-17
  Administered 2016-10-16 – 2016-10-17 (×2): via ORAL

## 2016-10-16 MED ORDER — HYDRALAZINE 20 MG/ML IJ SOLN
20 mg/mL | Freq: Once | INTRAMUSCULAR | Status: AC
Start: 2016-10-16 — End: 2016-10-16
  Administered 2016-10-16: 09:00:00 via INTRAVENOUS

## 2016-10-16 MED ORDER — BUMETANIDE 1 MG TAB
1 mg | Freq: Every day | ORAL | Status: DC
Start: 2016-10-16 — End: 2016-10-17
  Administered 2016-10-16: 13:00:00 via ORAL

## 2016-10-16 MED ORDER — POTASSIUM CHLORIDE SR 10 MEQ TAB
10 mEq | ORAL | Status: AC
Start: 2016-10-16 — End: 2016-10-16
  Administered 2016-10-16: 15:00:00 via ORAL

## 2016-10-16 MED ORDER — ASPIRIN 81 MG TAB, DELAYED RELEASE
81 mg | Freq: Every day | ORAL | Status: DC
Start: 2016-10-16 — End: 2016-10-17
  Administered 2016-10-16 – 2016-10-17 (×2): via ORAL

## 2016-10-16 MED ORDER — GABAPENTIN 300 MG CAP
300 mg | Freq: Four times a day (QID) | ORAL | Status: DC
Start: 2016-10-16 — End: 2016-10-17
  Administered 2016-10-16 – 2016-10-17 (×5): via ORAL

## 2016-10-16 MED ORDER — POTASSIUM CHLORIDE SR 10 MEQ TAB
10 mEq | ORAL | Status: AC
Start: 2016-10-16 — End: 2016-10-16
  Administered 2016-10-16: 22:00:00 via ORAL

## 2016-10-16 MED ORDER — NITROGLYCERIN 2 % TRANSDERMAL OINTMENT
2 % | TRANSDERMAL | Status: AC
Start: 2016-10-16 — End: 2016-10-16
  Administered 2016-10-16: 04:00:00 via TOPICAL

## 2016-10-16 MED ORDER — INSULIN GLARGINE 100 UNIT/ML INJECTION
100 unit/mL | Freq: Every evening | SUBCUTANEOUS | Status: DC
Start: 2016-10-16 — End: 2016-10-17
  Administered 2016-10-17: 02:00:00 via SUBCUTANEOUS

## 2016-10-16 MED ORDER — PANTOPRAZOLE 40 MG TAB, DELAYED RELEASE
40 mg | Freq: Every day | ORAL | Status: DC
Start: 2016-10-16 — End: 2016-10-17
  Administered 2016-10-16 – 2016-10-17 (×2): via ORAL

## 2016-10-16 MED ORDER — DULOXETINE 60 MG CAP, DELAYED RELEASE
60 mg | Freq: Every day | ORAL | Status: DC
Start: 2016-10-16 — End: 2016-10-17
  Administered 2016-10-16 – 2016-10-17 (×2): via ORAL

## 2016-10-16 MED FILL — POTASSIUM CHLORIDE 10 MEQ/50 ML IV PIGGY BACK: 10 mEq/50 mL | INTRAVENOUS | Qty: 50

## 2016-10-16 MED FILL — NITROSTAT 0.4 MG SUBLINGUAL TABLET: 0.4 mg | SUBLINGUAL | Qty: 1

## 2016-10-16 MED FILL — EZETIMIBE 10 MG TAB: 10 mg | ORAL | Qty: 1

## 2016-10-16 MED FILL — LANTUS U-100 INSULIN 100 UNIT/ML SUBCUTANEOUS SOLUTION: 100 unit/mL | SUBCUTANEOUS | Qty: 0.3

## 2016-10-16 MED FILL — CARVEDILOL 12.5 MG TAB: 12.5 mg | ORAL | Qty: 2

## 2016-10-16 MED FILL — DULOXETINE 60 MG CAP, DELAYED RELEASE: 60 mg | ORAL | Qty: 1

## 2016-10-16 MED FILL — PANTOPRAZOLE 40 MG TAB, DELAYED RELEASE: 40 mg | ORAL | Qty: 1

## 2016-10-16 MED FILL — NORMAL SALINE FLUSH 0.9 % INJECTION SYRINGE: INTRAMUSCULAR | Qty: 10

## 2016-10-16 MED FILL — ASPIRIN 81 MG TAB, DELAYED RELEASE: 81 mg | ORAL | Qty: 1

## 2016-10-16 MED FILL — NITRO-BID 2 % TRANSDERMAL OINTMENT: 2 % | TRANSDERMAL | Qty: 1

## 2016-10-16 MED FILL — CLOPIDOGREL 75 MG TAB: 75 mg | ORAL | Qty: 1

## 2016-10-16 MED FILL — MAGNESIUM SULFATE 2 GRAM/50 ML IVPB: 2 gram/50 mL (4 %) | INTRAVENOUS | Qty: 50

## 2016-10-16 MED FILL — PRAVASTATIN 40 MG TAB: 40 mg | ORAL | Qty: 1

## 2016-10-16 MED FILL — CHOLECALCIFEROL (VITAMIN D3) 1,000 UNIT (25 MCG) TAB: ORAL | Qty: 2

## 2016-10-16 MED FILL — LOSARTAN 25 MG TAB: 25 mg | ORAL | Qty: 1

## 2016-10-16 MED FILL — TYLENOL 325 MG TABLET: 325 mg | ORAL | Qty: 2

## 2016-10-16 MED FILL — OXYBUTYNIN CHLORIDE SR 5 MG 24 HR TAB: 5 mg | ORAL | Qty: 2

## 2016-10-16 MED FILL — NORMAL SALINE FLUSH 0.9 % INJECTION SYRINGE: INTRAMUSCULAR | Qty: 20

## 2016-10-16 MED FILL — K-TAB 10 MEQ TABLET,EXTENDED RELEASE: 10 mEq | ORAL | Qty: 2

## 2016-10-16 MED FILL — K-TAB 10 MEQ TABLET,EXTENDED RELEASE: 10 mEq | ORAL | Qty: 1

## 2016-10-16 MED FILL — HYDRALAZINE 20 MG/ML IJ SOLN: 20 mg/mL | INTRAMUSCULAR | Qty: 1

## 2016-10-16 MED FILL — GABAPENTIN 300 MG CAP: 300 mg | ORAL | Qty: 1

## 2016-10-16 MED FILL — BUMETANIDE 1 MG TAB: 1 mg | ORAL | Qty: 2

## 2016-10-16 MED FILL — LOSARTAN 50 MG TAB: 50 mg | ORAL | Qty: 1

## 2016-10-16 NOTE — Consults (Signed)
Consults by  Madolyn Frieze, MD at 10/16/16 [phone]                Author: Madolyn Frieze, MD  Service: Cardiology  Author Type: Physician       Filed: 10/16/16 1135  Date of Service: 10/16/16 0941  Status: Addendum          Editor: Madolyn Frieze, MD (Physician)          Related Notes: Original Note by Zimmer, Vilinda Blanks., NP (Nurse Practitioner) filed at 10/16/16 1123            Consult Orders        1. IP CONSULT TO CARDIOLOGY [[id number]] ordered by Genene Churn, MD at 10/16/16 [phone]                                                                                                     C ardiology Consult Note         Patient Name: Kelsey [name]  DOB : July 29, 1951 MRN: [id number]   Date: 10/16/2016  Time : 9:42 AM      Admit Diagnosis: Jaw claudication;CAD (coronary artery disease);Hypertensive*      Primary Cardiologist: Dr. Ninfa Linden, Md  Consulting Cardiologist: Jerl Mina, M.D.      Reason for Consult: jaw pain      Requesting MD: Dr.Pahle, Md      HPI:   Kelsey [name] is a 65 y.o.  female admitted on 10/15/2016   for Jaw claudication;CAD (coronary artery disease);Hypertensive*.   She has PMH  CAD, MI, PCI x 3, 3 vessel CABG 06/08/2012 by Dr. Myles Gip s/p  PCI/DES to diagonal 09/08/16 by Dr. Suzan Slick, HTN, HLD,  Carotid stenosis, severe AS s/p bioprosthetic bovine AVR 05/2012, PAD s/p stent to distal right SFA and left common iliac artery, type 2 DM, COPD, GERD.  She presented 10/25 with chief c/o of jaw pain  which started at approximately 20:30 on 10/15/16. Came to ER after nitro did not resolve symptoms. Noted similar symptoms with previous MI.  She developed a headache s/p Nitroglycerin.  Never had chest pain with previous MI's. Patient also checked her  blood pressure which was "215/98". She started a Losartan 10/24.  Patient reports that she was recently evaluated by Cardiology in preparation for hip replacement.  Last left heart catheterization was performed by Dr. Aggie Moats on 08/29/2016 with  the  following findings:  patent LIMA to LAD, SVG's to RCA and LCX. ??Very large diagonal has eccentric 90% stenosis, treated with 2.75 x 12 Xience DES good result. ??Bioprosthetic AV not crossed given recent echo.  12 lead EKG in ER with no acute changes.   1st troponin negative.  Cardiology consulted for further eval of jaw pain.      Subjective:  Developed sudden onset of bilateral jaw pain on 10/15/16 evening.  At first she thought it may have been related to allergies/sinus problems but also states she had unilateral jaw  pain with previous MI that required CABG.  She took nitro which helped pain significantly.  Denies  having any chest pain at that time and had no SOB, diaphoresis, radiation of pain, nausea or vomiting, lightheadedness, dizziness, near syncope/syncope  or palpitations.  No exacerbating factors identified.   States she also took her BP and it was elevated.  She called EMS and en route BP was 246/99.  (States  BP has been elevated for several weeks prompting an increase in coreg to 25 mg BID in late August/early  September and she just was started on Losartan by her PCP on 10/24).  Upon arrival to ER, pt was given additional nitro and jaw pain went away. She has not had any jaw pain since it was relieved in ER.  Does not exercise due to needing hip and knee replacement.   Had not been having SOB at home but does state that unlike previous stents, this past stent placement in 08/2016 did not make her feel better as she still has felt fatigued. Just walked to bathroom and back without chest pain, SOB or jaw pain.  Has had  mild BLE edema which is unchanged.             Assessment and Plan        1.Jaw pain:  Could be anginal equivalent as she has had this in the past.  Troponin <0.04>> now 0.32 12 lead EKG NSR with RBB, LAFB unchanged from previous EKG.   -Trend troponin (ordered add on troponin for this a.m.).- now 0.32 on add on.    -Continue nitro prn,  BB, ASA, plavix   - LHC scheduled for  tomorrow at 10:45 PM      2. Hypertensive urgency:  BP better- now 149/56 prior to meds   -Coreg 25 mg BID   -Losartan 25 mg daily   -On bumex 2 mg daily      3. Hx of CAD: s/p multiple stents and CABG. Last TTE 07/2016 EF 55-60%, NWMA, BAE, mild MR, no AI   -ASA, plavix, statin   -Coreg as above      4. Hx of dyslipidemia:  LDL 18, HDL 46, total cholesterol 90, triglyceride 129   -Pravachol 40 mg daily, zetia 10 mg daily      5. Hx of severe AS, s/p bioprosthetic bovine AVR 06/08/2012- stable by TTE 07/2016      6. Hx of DM: A1C 6.1 in 09/30/2016      7. Hx of tobacco abuse      8. Hypokalemia:  K-3   -Repletion ordered   -Check magnesium.      9. RBBB- chronic.      10. Hypocalcemia- calcium 6.6- will check ionized calcium and replete.   I have seen and examined the patient and agree with N.P. assessment.   Recurrent jaw pain same as before also relieved by slntg  at rest of concern   Elevation in troponin also of concern   No acute ischemic changes on ecg   Discussed at length possible options   In my opinion given presentation stress test may not be very helpful   Discussed consideration towards cardiac catheterization even if patient has undergone pci only about 1 month ago      The procedure was discussed at length with the patient, including risks/benefits, potential findings and treatment options. Risks including but not limited to CVA, DEATH,MI,ARF, CRF requiring chronic dialysis,emergencgy CABG or pacemaker, bleeding need  for transfusions, vascular injuries requiring intervention. Alternative options were offered. .She agrees to proceed.  Review of Symptoms:   Pertinent items are noted in HPI. and subjective      Previous treatment/evaluation includes Coronary Artery Bypass Graft, Percutaneous Coronary Intervention, echocardiogram, coronary angioplasty and stress test  .   Cardiac risk factors: smoking/ tobacco exposure, family history, dyslipidemia, diabetes mellitus, obesity, sedentary  life style, hypertension, post-menopausal.        Past Medical History:        Diagnosis  Date         ?  Acute MI            x3         ?  Arthritis       ?  CAD (coronary artery disease)       ?  Cervical spondylolysis       ?  Chronic pain       ?  COPD       ?  Diabetes ([place])       ?  GERD (gastroesophageal reflux disease)       ?  Goiter  04/20/2012     ?  Hypertension           ?  Neuropathy  04/06/2013          Past Surgical History:         Procedure  Laterality  Date          ?  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  CARDIAC SURG PROCEDURE UNLIST              PTCA          ?  HX KNEE ARTHROSCOPY  Right            X2          ?  HX OTHER SURGICAL    1970          GANGLION CYST          ?  HX TONSILLECTOMY    1963          Current Facility-Administered Medications          Medication  Dose  Route  Frequency           ?  sodium chloride (NS) flush 5-10 mL   5-10 mL  IntraVENous  Q8H     ?  sodium chloride (NS) flush 5-10 mL   5-10 mL  IntraVENous  PRN     ?  aspirin delayed-release tablet 81 mg   81 mg  Oral  DAILY     ?  bumetanide (BUMEX) tablet 2 mg   2 mg  Oral  DAILY     ?  carvedilol (COREG) tablet 25 mg   25 mg  Oral  BID     ?  cholecalciferol (VITAMIN D3) tablet 2,000 Units   2,000 Units  Oral  DAILY     ?  clopidogrel (PLAVIX) tablet 75 mg   75 mg  Oral  DAILY     ?  DULoxetine (CYMBALTA) capsule 60 mg   60 mg  Oral  DAILY     ?  ezetimibe (ZETIA) tablet 10 mg   10 mg  Oral  DAILY     ?  losartan (COZAAR) tablet 25 mg   25 mg  Oral  DAILY     ?  pantoprazole (PROTONIX) tablet 40  mg   40 mg  Oral  ACB           ?  pravastatin (PRAVACHOL) tablet 40 mg   40 mg  Oral  QHS           ?  oxybutynin chloride XL (DITROPAN XL) tablet 10 mg   10 mg  Oral  DAILY     ?  potassium chloride 10 mEq in 50 ml IVPB   10 mEq  IntraVENous  Q1H     ?  magnesium sulfate 2 g/50 ml IVPB (premix or compounded)   2 g  IntraVENous  Q1H           ?  nitroglycerin (NITROSTAT) tablet 0.4 mg   0.4 mg  SubLINGual   Q5MIN PRN            Allergies        Allergen  Reactions         ?  Morphine  Anaphylaxis             Tolerates oxycodone without problem         ?  Avalide [Irbesartan-Hydrochlorothiazide]  Myalgia     ?  Demerol [Meperidine]  Other (comments)             Makes her feel crazy         ?  Pcn [Penicillins]  Swelling     ?  Prinivil [Lisinopril]  Cough     ?  Statins-Hmg-Coa Reductase Inhibitors  Myalgia     ?  Sulfa (Sulfonamide Antibiotics)  Unknown (comments)         ?  Tricor [Fenofibrate Micronized]  Myalgia           Family History         Problem  Relation  Age of Onset          ?  Lung Disease  Mother       ?  Stroke  Father       ?  Lung Disease  Brother               COPD          ?  Hypertension  Brother       ?  Cancer  Brother               PROSTATE          ?  Heart Disease  Brother       ?  Diabetes  Brother            ?  Anesth Problems  Neg Hx             Social History          Social History         ?  Marital status:  MARRIED              Spouse name:  N/A         ?  Number of children:  N/A         ?  Years of education:  N/A          Social History Main Topics         ?  Smoking status:  Current Every Day Smoker              Packs/day:  0.30         Years:  45.00  Types:  Cigarettes         ?  Smokeless tobacco:  Never Used                Comment: passed ready         ?  Alcohol use  Yes                Comment: may be 3 times a year if that         ?  Drug use:  No         ?  Sexual activity:  Not on file           Other Topics  Concern        ?  Not on file          Social History Narrative           Objective:     Physical Exam      Vitals:      Vitals:             10/16/16 0506  10/16/16 0631  10/16/16 0819  10/16/16 1124           BP:  160/67  162/70  149/56  134/61     Pulse:  70  73  69  66     Resp:      16  18     Temp:      97.5 ??F (36.4 ??C)  97.5 ??F (36.4 ??C)     SpO2:      91%  95%     Weight:                   Height:                      General:     Alert, cooperative, no  distress, appears stated age.        Neck:    Supple, no JVD.     Back:      Symmetric,      Lungs:      Clear to auscultation bilaterally.     Heart::     Regular rate and rhythm, S1, S2 normal, grade II/VI systolic murmur        Abdomen:      Soft, non-tender. Bowel sounds normal. No masses,  No       organomegaly.     Extremities:    Trace BLE edema     Vascular:    Pulses - 1+ DP     Skin:    Skin color normal. No rashes or lesions        Neurologic:    MAE.           Telemetry: normal sinus rhythm      ECG: NSR with RBB, LAFB      Data Review:       Radiology:   CXR: no acute findings.        Recent Labs             10/16/16    0336   10/15/16    2324         TROIQ   0.31*   <0.04          Recent Labs             10/16/16    0336   10/15/16    2324     NA  141   137     K   3.0*   4.3     CL   112*   100     CO2   21   27     BUN   12   17     CREA   0.50*   0.86     GLU   91   144*         CA   6.6*   8.7          Recent Labs             10/16/16    0336   10/15/16    2324     WBC   7.6   8.6     HGB   9.6*   10.5*     HCT   30.5*   33.0*         PLT   250   255          Recent Labs            10/15/16    2324     SGOT   11*        AP   84          Recent Labs            10/16/16    0336     CHOL   90        LDLC   18.2        No results for input(s): CRP, TSH, TSHEXT, TSHEXT in the last 72 hours.      No lab exists for component: ESR      Thank you very much for this referral. I appreciate the opportunity to participate in this patient's care. I will follow along with above stated plan.      Madolyn Frieze, MD              [facility], [address]      [place], [place]      [phone]      JS:EGBTD Alcide Clever, MD

## 2016-10-16 NOTE — Other (Signed)
Cardiac Rehab: Met with Kelsey [name]. She declined additional education material stating "I have a large folder of education at home.".    [name] had a PCI in September and attended her cardiac rehab intake appointment. She continues to go to PT for her hip issues while she awaits hip surgery therefore, she is not a candidate for cardiac rehab at this time. Discussed her pending cardiac cath tomorrow and she understands the purpose and procedure. Offered cardiac rehab, if and when she is able. Kelsey [name] verbalized understanding with questions answered.    Will continue to follow for cardiac cath results and education needs. Birder Robson, RN

## 2016-10-16 NOTE — ED Notes (Signed)
Dr Marks at bedside

## 2016-10-16 NOTE — ED Notes (Signed)
Warm blanket given.

## 2016-10-16 NOTE — Progress Notes (Signed)
Medical Progress Note      NAME: Kelsey [name]   DOB:  [birth year]  MRM:  [id number]    Date/Time: 10/16/2016  8:56 AM         Problem List:     Active Problems:    CAD (coronary artery disease) (10/16/2016)      Jaw claudication (10/16/2016)      Hypertensive urgency (10/16/2016)             Subjective:     Patient admitted overnight for jaw pain which she has had with each MI.  Pain gone now.    Past Medical History:   Diagnosis Date   ??? Acute MI     x3   ??? Arthritis    ??? CAD (coronary artery disease)    ??? Cervical spondylolysis    ??? Chronic pain    ??? COPD    ??? Diabetes ([place])    ??? GERD (gastroesophageal reflux disease)    ??? Goiter 04/20/2012   ??? Hypertension    ??? Neuropathy 04/06/2013       ROS:  General: negative for fever, chills, sweats, weakness  Cardiology:  negative for chest pain, palpitations, orthopnea, PND, edema, syncope   Gastrointestinal: negative for abdominal pain, N/V, dysphagia, change in bowel habits, bleeding         Objective:       Vitals:          Last 24hrs VS reviewed since prior progress note. Most recent are:    Visit Vitals   ??? BP 149/56 (BP 1 Location: Right arm, BP Patient Position: At rest)   ??? Pulse 69   ??? Temp 97.5 ??F (36.4 ??C)   ??? Resp 16   ??? Ht '5\' 6"'  (1.676 m)   ??? Wt 206 lb 9.1 oz (93.7 kg)   ??? SpO2 91%   ??? Breastfeeding No   ??? BMI 33.34 kg/m2     SpO2 Readings from Last 6 Encounters:   10/16/16 91%   09/04/16 98%   08/29/16 97%   09/26/15 97%   09/18/15 94%   09/13/15 95%          Intake/Output Summary (Last 24 hours) at 10/16/16 0856  Last data filed at 10/16/16 0510   Gross per 24 hour   Intake              100 ml   Output                0 ml   Net              100 ml          Exam:     General   well developed, well nourished, appears stated age, in no acute distress  Respiratory   Coarse breath sounds  Cardiology  1/6 asm      Lab Data Reviewed: (see below)    Medications Reviewed: (see below)    ______________________________________________________________________     Medications:     Current Facility-Administered Medications   Medication Dose Route Frequency   ??? sodium chloride (NS) flush 5-10 mL  5-10 mL IntraVENous Q8H   ??? sodium chloride (NS) flush 5-10 mL  5-10 mL IntraVENous PRN   ??? aspirin delayed-release tablet 81 mg  81 mg Oral DAILY   ??? bumetanide (BUMEX) tablet 2 mg  2 mg Oral DAILY   ??? carvedilol (COREG) tablet 25 mg  25 mg Oral BID   ???  cholecalciferol (VITAMIN D3) tablet 2,000 Units  2,000 Units Oral DAILY   ??? clopidogrel (PLAVIX) tablet 75 mg  75 mg Oral DAILY   ??? DULoxetine (CYMBALTA) capsule 60 mg  60 mg Oral DAILY   ??? ezetimibe (ZETIA) tablet 10 mg  10 mg Oral DAILY   ??? losartan (COZAAR) tablet 25 mg  25 mg Oral DAILY   ??? pantoprazole (PROTONIX) tablet 40 mg  40 mg Oral ACB   ??? pravastatin (PRAVACHOL) tablet 40 mg  40 mg Oral QHS   ??? oxybutynin chloride XL (DITROPAN XL) tablet 10 mg  10 mg Oral DAILY   ??? nitroglycerin (NITROSTAT) tablet 0.4 mg  0.4 mg SubLINGual Q5MIN PRN            Lab Review:     Recent Labs      10/16/16   0336  10/15/16   2324   WBC  7.6  8.6   HGB  9.6*  10.5*   HCT  30.5*  33.0*   PLT  250  255     Recent Labs      10/16/16   0336  10/15/16   2324   NA  141  137   K  3.0*  4.3   CL  112*  100   CO2  21  27   GLU  91  144*   BUN  12  17   CREA  0.50*  0.86   CA  6.6*  8.7   ALB   --   3.5   TBILI   --   0.2   SGOT   --   11*   ALT   --   19     Lab Results   Component Value Date/Time    Glucose (POC) 153 08/29/2016 09:02 AM    Glucose (POC) 217 12/23/2012 12:14 PM    Glucose (POC) 202 12/23/2012 08:08 AM    Glucose (POC) 142 03/13/2011 09:49 PM    Glucose (POC) 138 03/13/2011 04:58 PM     No results for input(s): PH, PCO2, PO2, HCO3, FIO2 in the last 72 hours.  No results for input(s): INR in the last 72 hours.    No lab exists for component: INREXT    Other pertinent lab: NA         Assessment:     Patient Active Problem List   Diagnosis Code   ??? Coronary atherosclerosis of native coronary artery I25.10   ??? Other chest pain R07.89    ??? Other dyspnea and respiratory abnormality R06.09, R09.89   ??? Mixed hyperlipidemia E78.2   ??? Type II diabetes mellitus, uncontrolled ([facility]) E11.65   ??? Dyspnea R06.00   ??? Goiter E04.9   ??? Neuropathy G62.9   ??? Long term (current) use of insulin ([facility]) Z79.4   ??? Hypertension complicating diabetes ([place]) E11.59, I10   ??? CAD (coronary artery disease) I25.10   ??? 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 Hypertensive urgency I16.0          Plan:                 1. Jaw pain- consult cards, serial troponins  2. Diabetes- ssi  3. htn- continue meds  4. Copd- stop smoking                ___________________________________________________    Attending Physician: Genene Churn, MD

## 2016-10-16 NOTE — H&P (Signed)
History & Physical    Date of admission: 10/15/2016    Patient name: Kelsey [name]  MRN: [id number]  Date of birth: Aug 21, 1951  Age: 65 y.o.     Primary care provider:  Genene Churn, MD     Source of Information: patient, ED, and medical records                                  Chief complaint:  Jaw pain    History of present illness  Kelsey [name] is a 65 y.o. female with past medical history significant for CAD, MI, CABG, hypertension, hyperlipidemia, type 2 diabetes mellitus, COPD, GERD who presents from home via EMS with chief complaint of jaw pain.   Symptom onset reportedly began at tonight @ 20:30 (3 hours pta).   Patient was limited historian as she was quite drowsy but majority of history was obtained per my review of ED and medical reports.  Per these collective reports, pain was initially 6/10. Patient took 1 Nitro with some relief (reducing pain to 2/10). She developed a headache s/p Nitroglycerin. She notedly had similar symptoms related to MI with jaw pain only.  Patient notes that she never had chest pain with previous MI's. Patient also checked her blood pressure which was "215/98". She started a new BP medication yesterday (Losartan).  Patient reports that she was recently evaluated by Cardiology in preparation for hip replacement.  Last left heart catheterization was performed by Dr. Aggie Moats on 08/29/2016 with the following findings:  patent LIMA to LAD, SVG's to RCA and LCX.  Very large diagonal has eccentric 90% stenosis, treated with 2.75 x 12 Xience DES good result.  Bioprosthetic AV not crossed given recent echo;  Angioseal to groin; Plavix was begun.    On arrival in the ED tonight, initial recorded vital signs were BP= 191/69, HR= 66, RR= 15, O2sat= 99% on room air.  12 lead EKG showed normal sinus rhythm, RBBB, bifascicular block at 68 bpm.  BP remained elevated.   Patient was given NTG 2% ointment transdermal x 1 dose, Coreg, and Losartan.  Patient is now seen for admission to the hospitalist service for continued evaluation and treatments.  There were no reports of new onset syncope, loss of consciousness, headache, neck pain, visual disturbance, numbness, paresthesias, focal weakness, chest pain, shortness of breath, cough, congestion, palpitations, abdominal pain, nausea, vomiting, diarrhea, melena, dysuria, hematuria, calf pain, increased leg swelling/ edema, fever, chills, rash, or other constitutional symptoms.       Past Medical History:   Diagnosis Date   ??? Acute MI     x3   ??? Arthritis    ??? CAD (coronary artery disease)    ??? Cervical spondylolysis    ??? Chronic pain    ??? COPD    ??? Diabetes ([place])    ??? GERD (gastroesophageal reflux disease)    ??? Goiter 04/20/2012   ??? Hypertension    ??? Neuropathy 04/06/2013      Past Surgical History:   Procedure Laterality Date   ??? 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 CARDIAC SURG PROCEDURE UNLIST      PTCA   ??? HX KNEE ARTHROSCOPY Right     X2   ??? HX OTHER SURGICAL  1970    GANGLION CYST   ??? HX TONSILLECTOMY  1963     Prior to Admission medications  Medication Sig Start Date End Date Taking? Authorizing Provider   losartan (COZAAR) 25 mg tablet Take 1 Tab by mouth daily. 10/10/16   Genene Churn, MD   pravastatin (PRAVACHOL) 40 mg tablet Take 1 Tab by mouth nightly. 09/30/16   Genene Churn, MD   GUAIFENESIN (MUCINEX PO) Take  by mouth.    Historical Provider   nabumetone (RELAFEN) 500 mg tablet TAKE 1 TAB BY MOUTH TWO (2) TIMES A DAY. 09/23/16   Genene Churn, MD   carvedilol (COREG) 25 mg tablet Take 1 Tab by mouth two (2) times a day. 09/04/16   Rockwell Alexandria, NP   nitroglycerin (NITROSTAT) 0.4 mg SL tablet 1 Tab by SubLINGual route every five (5) minutes as needed for Chest Pain for up to 3 doses. 09/04/16   Rockwell Alexandria, NP   nabumetone (RELAFEN) 500 mg tablet Take 500 mg by mouth two (2) times a  day.    Historical Provider   clopidogrel (PLAVIX) 75 mg tab Take 1 Tab by mouth daily for 360 days. 08/29/16 08/24/17  Valinda Hoar, MD   CHANTIX CONTINUING MONTH BOX 1 mg tablet TAKE 1 TABLET BY MOUTH TWICE A DAY 08/01/16   Genene Churn, MD   methocarbamol (ROBAXIN) 500 mg tablet Take 1,000 mg by mouth two (2) times a day. Patient take by mouth as needed    Historical Provider   Cholecalciferol, Vitamin D3, (VITAMIN D3) 2,000 unit cap capsule One capsule by mouth daily 07/08/16   Rockwell Alexandria, NP   aspirin delayed-release 81 mg tablet Take 1 Tab by mouth daily. 07/08/16   Rockwell Alexandria, NP   insulin detemir (LEVEMIR FLEXTOUCH) 100 unit/mL (3 mL) inpn 38 Units by SubCUTAneous route nightly.    Historical Provider   VESICARE 10 mg tablet TAKE 1 TAB BY MOUTH DAILY. 06/16/16   Genene Churn, MD   metFORMIN ER (GLUCOPHAGE XR) 500 mg tablet TAKE 2 TABLETS BY MOUTH IN THE MORNING WITH BREAKFAST AND 3 TABLETS BY MOUTH AT BEDTIME  Patient taking differently: TAKE 2 TABLETS BY MOUTH TWICE DAILY 04/30/16   Genene Churn, MD   ezetimibe (ZETIA) 10 mg tablet TAKE 1 TAB BY MOUTH NIGHTLY. 04/30/16   Genene Churn, MD   DULoxetine (CYMBALTA) 60 mg capsule TAKE 1 CAPSULE BY MOUTH DAILY 03/31/16   Genene Churn, MD   pantoprazole (PROTONIX) 40 mg tablet TAKE 1 TABLET BY MOUTH DAILY 03/23/16   Genene Churn, MD   gabapentin (NEURONTIN) 300 mg capsule TAKE 3 CAPSULES BY MOUTH 3 TIMES A DAY  Patient taking differently: take 2 tabs at 3 pm and 3 tabs at bedtime 02/03/16   Genene Churn, MD   triamcinolone acetonide (KENALOG) 0.1 % topical cream Apply  to affected area daily. 10/24/15   Genene Churn, MD   traMADol (ULTRAM) 50 mg tablet Take 2 Tabs by mouth every six (6) hours as needed for Pain. 10/16/14   Genene Churn, MD   bumetanide (BUMEX) 2 mg tablet Take 2 mg by mouth daily.    Historical Provider     Allergies   Allergen Reactions   ??? Morphine Anaphylaxis     Tolerates oxycodone without problem    ??? Avalide [Irbesartan-Hydrochlorothiazide] Myalgia   ??? Demerol [Meperidine] Other (comments)     Makes her feel crazy   ??? Pcn [Penicillins] Swelling   ??? Prinivil [Lisinopril] Cough   ??? Statins-Hmg-Coa Reductase Inhibitors Myalgia   ???  Sulfa (Sulfonamide Antibiotics) Unknown (comments)   ??? Tricor [Fenofibrate Micronized] Myalgia      Family History   Problem Relation Age of Onset   ??? Lung Disease Mother    ??? Stroke Father    ??? Lung Disease Brother      COPD   ??? Hypertension Brother    ??? Cancer Brother      PROSTATE   ??? Heart Disease Brother    ??? Diabetes Brother    ??? Anesth Problems Neg Hx          Social history  Patient resides  x  Independently                Ambulates  x  Independently                 Alcohol history   x  occasional           Smoking history          x  Current smoker     History   Smoking Status   ??? Current Every Day Smoker   ??? Packs/day: 0.30   ??? Years: 45.00   ??? Types: Cigarettes   Smokeless Tobacco   ??? Never Used     Comment: passed ready       Code status  x  Full code     Review of systems  I performed a comprehensive 12 systems review; pertinent positives were as noted in HPI, otherwise negative.    Physical Examination   Visit Vitals   ??? BP 198/82   ??? Pulse 65   ??? Temp 98.1 ??F (36.7 ??C)   ??? Resp 15   ??? Ht _0  (1.676 m)   ??? Wt 90.3 kg (199 lb)   ??? SpO2 97%   ??? BMI 32.12 kg/m2          O2 Device: Room air    General:  Obese female in no acute respiratory distress   Head:  Normocephalic, without obvious abnormality, atraumatic   Eyes:  Conjunctivae/corneas clear. PERRL, EOMs intact   E/N/M/T: Nares normal. Septum midline. No nasal drainage or sinus tenderness  Tongue midline/ non-edematous  Clear oropharynx   Neck: Normal appearance and movements, symmetrical, trachea midline  No palpable adenopathy  No thyroid enlargement, tenderness or nodules  No carotid bruit   Normal JVD   Lungs:   Symmetrical chest expansion and respiratory effort  Clear to auscultation bilaterally    Chest wall:  No tenderness or deformity   Heart:  Regular rate and rhythm   Sounds normal; no murmur, click, rub or gallop   Abdomen:   Soft, no tenderness  No rebound, guarding, or rigidity  Non-distended  Bowel sounds normal  No masses or hepatosplenomegaly  No hernias present   Back: No CVA tenderness   Extremities: Extremities normal, atraumatic  No cyanosis or edema     Pulses/  Vascular: 2+ radial/ 1+ DP bilateral pulses   Skin: No rashes or ulcers  Warm/ dry   Musculo-      skeletal: Gait not tested  No calf tenderness   Neuro: After arousal from sleep:  GCS 15.  Moves all extremities x 4.  No slurred speech.  No facial droop.  Sensation grossly intact.    Psych: After arousal from sleep:  Drowsy but oriented x 3.             Data Review    24 Hour Results:  Recent Results (from the past 24 hour(s))   EKG, 12 LEAD, INITIAL    Collection Time: 10/15/16 11:18 PM   Result Value Ref Range    Ventricular Rate 68 BPM    Atrial Rate 68 BPM    P-R Interval 176 ms    QRS Duration 138 ms    Q-T Interval 468 ms    QTC Calculation (Bezet) 497 ms    Calculated P Axis 67 degrees    Calculated R Axis -62 degrees    Calculated T Axis 58 degrees    Diagnosis       Normal sinus rhythm  Right bundle branch block  Left anterior fascicular block  ** Bifascicular block **  When compared with ECG of 17-Dec-2012 11:54,  No significant change was found     CBC WITH AUTOMATED DIFF    Collection Time: 10/15/16 11:24 PM   Result Value Ref Range    WBC 8.6 3.6 - 11.0 K/uL    RBC 3.98 3.80 - 5.20 M/uL    HGB 10.5 (L) 11.5 - 16.0 g/dL    HCT 33.0 (L) 35.0 - 47.0 %    MCV 82.9 80.0 - 99.0 FL    MCH 26.4 26.0 - 34.0 PG    MCHC 31.8 30.0 - 36.5 g/dL    RDW 14.6 (H) 11.5 - 14.5 %    PLATELET 255 150 - 400 K/uL    NEUTROPHILS 63 32 - 75 %    LYMPHOCYTES 27 12 - 49 %    MONOCYTES 8 5 - 13 %    EOSINOPHILS 2 0 - 7 %    BASOPHILS 0 0 - 1 %    ABS. NEUTROPHILS 5.4 1.8 - 8.0 K/UL    ABS. LYMPHOCYTES 2.3 0.8 - 3.5 K/UL     ABS. MONOCYTES 0.7 0.0 - 1.0 K/UL    ABS. EOSINOPHILS 0.2 0.0 - 0.4 K/UL    ABS. BASOPHILS 0.0 0.0 - 0.1 K/UL   METABOLIC PANEL, COMPREHENSIVE    Collection Time: 10/15/16 11:24 PM   Result Value Ref Range    Sodium 137 136 - 145 mmol/L    Potassium 4.3 3.5 - 5.1 mmol/L    Chloride 100 97 - 108 mmol/L    CO2 27 21 - 32 mmol/L    Anion gap 10 5 - 15 mmol/L    Glucose 144 (H) 65 - 100 mg/dL    BUN 17 6 - 20 MG/DL    Creatinine 0.86 0.55 - 1.02 MG/DL    BUN/Creatinine ratio 20 12 - 20      GFR est AA >60 >60 ml/min/1.32m    GFR est non-AA >60 >60 ml/min/1.714m   Calcium 8.7 8.5 - 10.1 MG/DL    Bilirubin, total 0.2 0.2 - 1.0 MG/DL    ALT (SGPT) 19 12 - 78 U/L    AST (SGOT) 11 (L) 15 - 37 U/L    Alk. phosphatase 84 45 - 117 U/L    Protein, total 6.6 6.4 - 8.2 g/dL    Albumin 3.5 3.5 - 5.0 g/dL    Globulin 3.1 2.0 - 4.0 g/dL    A-G Ratio 1.1 1.1 - 2.2     TROPONIN I    Collection Time: 10/15/16 11:24 PM   Result Value Ref Range    Troponin-I, Qt. <0.04 <0.05 ng/mL     Recent Labs      10/15/16   2324   WBC  8.6   HGB  10.5*  HCT  33.0*   PLT  255     Recent Labs      10/15/16   2324   NA  137   K  4.3   CL  100   CO2  27   GLU  144*   BUN  17   CREA  0.86   CA  8.7   ALB  3.5   TBILI  0.2   SGOT  11*   ALT  19       Imaging  Chest xray portable:  COMPARISON: September 26, 2015  ??  FINDINGS: AP portable imaging of the chest performed at 11:53 PM demonstrates a  stable cardiomediastinal silhouette. The patient is status post median  sternotomy. The lungs are clear bilaterally. No significant osseous  abnormalities are seen.  ??  IMPRESSION: No evidence of acute cardiopulmonary process.      Assessment and Plan   1.  Jaw claudication.   Concern for possible AMI with angina equivalent.  Admit to telemetry.  Add supplemental oxygen.  Continue NTG and aspirin.  Consult cardiologist.  Repeat serial troponin levels.    2.  CAD.  Plan as above.    3.  Hypertensive urgency.  Already given NTG, Coreg, and Losartan.  Will  repeat VS/ BP and provide additional anti-hypertensive medications as needed.  Monitor BP closely.    4.  Type 2 diabetes mellitus.  Order Humalog insulin correctional coverage, scheduled blood glucose checks and check hemoglobin A1c level.    5.  COPD.  May have Duoneb nebulizer treatments q 4 hours prn.    6.  Hyperlipidemia.  Check lipid panel.  On Pravastatin.    7.  Obesity.  Would encourage carb consistent- cardiac diet/ lifestyle modification/ and weight loss.      8.  GERD.  On Protonix.     9.  VTE prophylaxis.  SCDs to BLEs.               Signed by: Marigene Ehlers, MD    October 16, 2016 at 1:54 AM

## 2016-10-16 NOTE — Telephone Encounter (Signed)
Cecile from [facility] needs to talk to Dr. Cherly HensenPahle patient is in the hospital.    Cecile R.N. ph# [phone]

## 2016-10-16 NOTE — Progress Notes (Signed)
1615-pt getting potassium IV -x2 bags-IV site hurting -administer only 1/2 of 2nd bag  Pt for cardiac cath in am-npo pmn tonite instructed  Consent signed by pt

## 2016-10-16 NOTE — ED Notes (Signed)
Nitro pill x 1 effective. Patient denies jaw pain. Patient stated she gets severe headache after taking nitro pill. MD notified. See new order.

## 2016-10-16 NOTE — Progress Notes (Addendum)
TRANSFER - IN REPORT:    Verbal report received from [place], Charity fundraiserN (name) on Kelsey [name]  being received from ED(unit) for routine progression of care      Report consisted of patient???s Situation, Background, Assessment and   Recommendations(SBAR).     Information from the following report(s) SBAR, Kardex, ED Summary, Recent Results and Cardiac Rhythm NSR with BBB was reviewed with the receiving nurse.    Opportunity for questions and clarification was provided.      Assessment completed upon patient???s arrival to unit and care assumed.     Primary Nurse Kelsey [name] and Kelsey SackKendra, RN performed a dual skin assessment on this patient No impairment noted  Braden score is 22              Problem: Falls - Risk of  Goal: *Absence of Falls  Document Schmid Fall Risk and appropriate interventions in the flowsheet.   Fall Risk Interventions:  Mobility Interventions: Patient to call before getting OOB, Utilize walker, cane, or other assitive device         Medication Interventions: Patient to call before getting OOB, Teach patient to arise slowly         History of Falls Interventions: Door open when patient unattended, Investigate reason for fall, Evaluate medications/consider consulting pharmacy    Bedside shift change report given to Kelsey [name], Charity fundraiserN (Cabin crewoncoming nurse) by Kelsey MingsKelsey, RN (offgoing nurse). Report included the following information SBAR, Kardex, Intake/Output, MAR, Recent Results and Cardiac Rhythm NSR with BBB.

## 2016-10-16 NOTE — ED Notes (Signed)
TRANSFER - OUT REPORT:    Verbal report given to [place](name) on Kelsey [name]  [facility]       Report consisted of patient???s Situation, Background, Assessment and   Recommendations(SBAR).     Information from the following report(s) SBAR was reviewed with the receiving nurse.    Lines:   Peripheral IV 10/15/16 Right Antecubital (Active)   Site Assessment Clean, dry, & intact 10/15/2016 11:33 PM   Phlebitis Assessment 0 10/15/2016 11:33 PM   Infiltration Assessment 0 10/15/2016 11:33 PM   Dressing Status Clean, dry, & intact 10/15/2016 11:33 PM   Hub Color/Line Status Pink 10/15/2016 11:33 PM        Opportunity for questions and clarification was provided.      Patient transported with:   Monitor, RN

## 2016-10-16 NOTE — Consults (Addendum)
Cardiology Consult Note      Patient Name: Kelsey [name]  DOB: [birth year] MRN: [id number]  Date: 10/16/2016  Time: 9:42 AM    Admit Diagnosis: Jaw claudication;CAD (coronary artery disease);Hypertensive*    Primary Cardiologist: Dr. Ninfa Linden, Md Consulting Cardiologist: Kelsey [name], M.D.    Reason for Consult: jaw pain    Requesting MD: Dr.Pahle, Md    HPI:  Kelsey [name] is a 65 y.o. female admitted on 10/15/2016  for Jaw claudication;CAD (coronary artery disease);Hypertensive*.   She has PMH  CAD, MI, PCI x 3, 3 vessel CABG 06/08/2012 by Dr. Myles [name] s/p PCI/DES to diagonal 09/08/16 by Dr. Suzan [name], HTN, HLD,  Carotid stenosis, severe AS s/p bioprosthetic bovine AVR 05/2012, PAD s/p stent to distal right SFA and left common iliac artery, type 2 DM, COPD, GERD.  She presented 10/25 with chief c/o of jaw pain which started at approximately 20:30 on 10/15/16. Came to ER after nitro did not resolve symptoms. Noted similar symptoms with previous MI.  She developed a headache s/p Nitroglycerin.  Never had chest pain with previous MI's. Patient also checked her blood pressure which was "215/98". She started a Losartan 10/24.  Patient reports that she was recently evaluated by Cardiology in preparation for hip replacement.  Last left heart catheterization was performed by Dr. Aggie [name] on 08/29/2016 with the following findings:  patent LIMA to LAD, SVG's to RCA and LCX. ??Very large diagonal has eccentric 90% stenosis, treated with 2.75 x 12 Xience DES good result. ??Bioprosthetic AV not crossed given recent echo.  12 lead EKG in ER with no acute changes.  1st troponin negative.  Cardiology consulted for further eval of jaw pain.    Subjective:  Developed sudden onset of bilateral jaw pain on 10/15/16 evening.  At first she thought it may have been related to allergies/sinus problems but also states she had unilateral jaw pain with previous MI that  required CABG.  She took nitro which helped pain significantly.  Denies having any chest pain at that time and had no SOB, diaphoresis, radiation of pain, nausea or vomiting, lightheadedness, dizziness, near syncope/syncope or palpitations.  No exacerbating factors identified.   States she also took her BP and it was elevated.  She called EMS and en route BP was 246/99.  (States  BP has been elevated for several weeks prompting an increase in coreg to 25 mg BID in late August/early September and she just was started on Losartan by her PCP on 10/24).  Upon arrival to ER, pt was given additional nitro and jaw pain went away. She has not had any jaw pain since it was relieved in ER.  Does not exercise due to needing hip and knee replacement.  Had not been having SOB at home but does state that unlike previous stents, this past stent placement in 08/2016 did not make her feel better as she still has felt fatigued. Just walked to bathroom and back without chest pain, SOB or jaw pain.  Has had mild BLE edema which is unchanged.        Assessment and Plan     1.Jaw pain:  Could be anginal equivalent as she has had this in the past.  Troponin <0.04>> now 0.32 12 lead EKG NSR with RBB, LAFB unchanged from previous EKG.  -Trend troponin (ordered add on troponin for this a.m.).- now 0.32 on add on.   -Continue nitro prn,  BB, ASA, plavix  - LHC scheduled for tomorrow  at 10:45 PM    2. Hypertensive urgency:  BP better- now 149/56 prior to meds  -Coreg 25 mg BID  -Losartan 25 mg daily  -On bumex 2 mg daily    3. Hx of CAD: s/p multiple stents and CABG. Last TTE 07/2016 EF 55-60%, NWMA, BAE, mild MR, no AI  -ASA, plavix, statin  -Coreg as above    4. Hx of dyslipidemia:  LDL 18, HDL 46, total cholesterol 90, triglyceride 129  -Pravachol 40 mg daily, zetia 10 mg daily    5. Hx of severe AS, s/p bioprosthetic bovine AVR 06/08/2012- stable by TTE 07/2016    6. Hx of DM: A1C 6.1 in 09/30/2016    7. Hx of tobacco abuse     8. Hypokalemia:  K-3  -Repletion ordered  -Check magnesium.    9. RBBB- chronic.    10. Hypocalcemia- calcium 6.6- will check ionized calcium and replete.  I have seen and examined the patient and agree with N.P. assessment.  Recurrent jaw pain same as before also relieved by slntg  at rest of concern  Elevation in troponin also of concern  No acute ischemic changes on ecg  Discussed at length possible options  In my opinion given presentation stress test may not be very helpful  Discussed consideration towards cardiac catheterization even if patient has undergone pci only about 1 month ago    The procedure was discussed at length with the patient, including risks/benefits, potential findings and treatment options. Risks including but not limited to CVA, DEATH,MI,ARF, CRF requiring chronic dialysis,emergencgy CABG or pacemaker, bleeding need for transfusions, vascular injuries requiring intervention. Alternative options were offered. .She agrees to proceed.             Review of Symptoms:  Pertinent items are noted in HPI. and subjective    Previous treatment/evaluation includes Coronary Artery Bypass Graft, Percutaneous Coronary Intervention, echocardiogram, coronary angioplasty and stress test .  Cardiac risk factors: smoking/ tobacco exposure, family history, dyslipidemia, diabetes mellitus, obesity, sedentary life style, hypertension, post-menopausal.    Past Medical History:   Diagnosis Date   ??? Acute MI     x3   ??? Arthritis    ??? CAD (coronary artery disease)    ??? Cervical spondylolysis    ??? Chronic pain    ??? COPD    ??? Diabetes ([place])    ??? GERD (gastroesophageal reflux disease)    ??? Goiter 04/20/2012   ??? Hypertension    ??? Neuropathy 04/06/2013     Past Surgical History:   Procedure Laterality Date   ??? 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 CARDIAC SURG PROCEDURE UNLIST      PTCA   ??? HX KNEE ARTHROSCOPY Right     X2   ??? HX OTHER SURGICAL  1970    GANGLION CYST   ??? HX TONSILLECTOMY  1963      Current Facility-Administered Medications   Medication Dose Route Frequency   ??? sodium chloride (NS) flush 5-10 mL  5-10 mL IntraVENous Q8H   ??? sodium chloride (NS) flush 5-10 mL  5-10 mL IntraVENous PRN   ??? aspirin delayed-release tablet 81 mg  81 mg Oral DAILY   ??? bumetanide (BUMEX) tablet 2 mg  2 mg Oral DAILY   ??? carvedilol (COREG) tablet 25 mg  25 mg Oral BID   ??? cholecalciferol (VITAMIN D3) tablet 2,000 Units  2,000 Units Oral DAILY   ??? clopidogrel (PLAVIX) tablet 75  mg  75 mg Oral DAILY   ??? DULoxetine (CYMBALTA) capsule 60 mg  60 mg Oral DAILY   ??? ezetimibe (ZETIA) tablet 10 mg  10 mg Oral DAILY   ??? losartan (COZAAR) tablet 25 mg  25 mg Oral DAILY   ??? pantoprazole (PROTONIX) tablet 40 mg  40 mg Oral ACB   ??? pravastatin (PRAVACHOL) tablet 40 mg  40 mg Oral QHS   ??? oxybutynin chloride XL (DITROPAN XL) tablet 10 mg  10 mg Oral DAILY   ??? potassium chloride 10 mEq in 50 ml IVPB  10 mEq IntraVENous Q1H   ??? magnesium sulfate 2 g/50 ml IVPB (premix or compounded)  2 g IntraVENous Q1H   ??? nitroglycerin (NITROSTAT) tablet 0.4 mg  0.4 mg SubLINGual Q5MIN PRN       Allergies   Allergen Reactions   ??? Morphine Anaphylaxis     Tolerates oxycodone without problem   ??? Avalide [Irbesartan-Hydrochlorothiazide] Myalgia   ??? Demerol [Meperidine] Other (comments)     Makes her feel crazy   ??? Pcn [Penicillins] Swelling   ??? Prinivil [Lisinopril] Cough   ??? Statins-Hmg-Coa Reductase Inhibitors Myalgia   ??? Sulfa (Sulfonamide Antibiotics) Unknown (comments)   ??? Tricor [Fenofibrate Micronized] Myalgia      Family History   Problem Relation Age of Onset   ??? Lung Disease Mother    ??? Stroke Father    ??? Lung Disease Brother      COPD   ??? Hypertension Brother    ??? Cancer Brother      PROSTATE   ??? Heart Disease Brother    ??? Diabetes Brother    ??? Anesth Problems Neg Hx       Social History     Social History   ??? Marital status: MARRIED     Spouse name: N/A   ??? Number of children: N/A   ??? Years of education: N/A     Social History Main Topics    ??? Smoking status: Current Every Day Smoker     Packs/day: 0.30     Years: 45.00     Types: Cigarettes   ??? Smokeless tobacco: Never Used      Comment: passed ready   ??? Alcohol use Yes      Comment: may be 3 times a year if that   ??? Drug use: No   ??? Sexual activity: Not on file     Other Topics Concern   ??? Not on file     Social History Narrative       Objective:    Physical Exam    Vitals:   Vitals:    10/16/16 0506 10/16/16 0631 10/16/16 0819 10/16/16 1124   BP: 160/67 162/70 149/56 134/61   Pulse: 70 73 69 66   Resp:   16 18   Temp:   97.5 ??F (36.4 ??C) 97.5 ??F (36.4 ??C)   SpO2:   91% 95%   Weight:       Height:           General:    Alert, cooperative, no distress, appears stated age.   Neck:   Supple, no JVD.   Back:     Symmetric,    Lungs:     Clear to auscultation bilaterally.   Heart::    Regular rate and rhythm, S1, S2 normal, grade II/VI systolic murmur     Abdomen:     Soft, non-tender. Bowel sounds normal. No masses,  No  organomegaly.   Extremities:   Trace BLE edema   Vascular:   Pulses - 1+ DP   Skin:   Skin color normal. No rashes or lesions   Neurologic:   MAE.       Telemetry: normal sinus rhythm    ECG: NSR with RBB, LAFB    Data Review:     Radiology:  CXR: no acute findings.    Recent Labs      10/16/16   0336  10/15/16   2324   TROIQ  0.31*  <0.04     Recent Labs      10/16/16   0336  10/15/16   2324   NA  141  137   K  3.0*  4.3   CL  112*  100   CO2  21  27   BUN  12  17   CREA  0.50*  0.86   GLU  91  144*   CA  6.6*  8.7     Recent Labs      10/16/16   0336  10/15/16   2324   WBC  7.6  8.6   HGB  9.6*  10.5*   HCT  30.5*  33.0*   PLT  250  255     Recent Labs      10/15/16   2324   SGOT  11*   AP  84     Recent Labs      10/16/16   0336   CHOL  90   LDLC  18.2     No results for input(s): CRP, TSH, TSHEXT, TSHEXT in the last 72 hours.    No lab exists for component: ESR    Thank you very much for this referral. I appreciate the opportunity to  participate in this patient's care. I will follow along with above stated plan.    Madolyn Frieze, MD         [facility] [place], [address]     [place], [place]     [phone]    GQ:QPYPP Alcide Clever, MD

## 2016-10-16 NOTE — Progress Notes (Signed)
Bedside shift change report given to [facility]CKelseyRN (oncoming nurse) by c.BrenkeRN (offgoing nurse). Report included the following information SBAR, Kardex, MAR and Recent Results.

## 2016-10-17 ENCOUNTER — Emergency Department: Admit: 2016-10-18 | Payer: MEDICARE | Primary: Internal Medicine

## 2016-10-17 ENCOUNTER — Ambulatory Visit: Payer: MEDICARE | Primary: Internal Medicine

## 2016-10-17 DIAGNOSIS — D649 Anemia, unspecified: Secondary | ICD-10-CM

## 2016-10-17 LAB — METABOLIC PANEL, BASIC
Anion gap: 7 mmol/L (ref 5–15)
BUN/Creatinine ratio: 19 (ref 12–20)
BUN: 18 MG/DL (ref 6–20)
CO2: 27 mmol/L (ref 21–32)
Calcium: 8 MG/DL — ABNORMAL LOW (ref 8.5–10.1)
Chloride: 100 mmol/L (ref 97–108)
Creatinine: 0.94 MG/DL (ref 0.55–1.02)
GFR est AA: >60 mL/min/{1.73_m2} (ref >60)
GFR est non-AA: 60 mL/min/{1.73_m2} — ABNORMAL LOW (ref >60)
Glucose: 136 mg/dL — ABNORMAL HIGH (ref 65–100)
Potassium: 4.3 mmol/L (ref 3.5–5.1)
Sodium: 134 mmol/L — ABNORMAL LOW (ref 136–145)

## 2016-10-17 LAB — GLUCOSE, POC
Glucose (POC): 156 mg/dL — ABNORMAL HIGH (ref 65–100)
Glucose (POC): 149 mg/dL — ABNORMAL HIGH (ref 65–100)
Glucose (POC): 136 mg/dL — ABNORMAL HIGH (ref 65–100)
Glucose (POC): 177 mg/dL — ABNORMAL HIGH (ref 65–100)

## 2016-10-17 LAB — CBC W/O DIFF
HCT: 31 % — ABNORMAL LOW (ref 35.0–47.0)
HGB: 10 g/dL — ABNORMAL LOW (ref 11.5–16.0)
MCH: 26.8 PG (ref 26.0–34.0)
MCHC: 32.3 g/dL (ref 30.0–36.5)
MCV: 83.1 FL (ref 80.0–99.0)
PLATELET: 247 10*3/uL (ref 150–400)
RBC: 3.73 M/uL — ABNORMAL LOW (ref 3.80–5.20)
RDW: 14.9 % — ABNORMAL HIGH (ref 11.5–14.5)
WBC: 7.5 10*3/uL (ref 3.6–11.0)

## 2016-10-17 LAB — MAGNESIUM: Magnesium: 2.1 mg/dL (ref 1.6–2.4)

## 2016-10-17 LAB — TROPONIN I: Troponin-I, Qt.: 0.68 ng/mL — ABNORMAL HIGH (ref <0.05)

## 2016-10-17 MED ORDER — SODIUM CHLORIDE 0.9 % IV
INTRAVENOUS | Status: AC
Start: 2016-10-17 — End: 2016-10-17
  Administered 2016-10-17: 16:00:00 via INTRAVENOUS

## 2016-10-17 MED ORDER — MIDAZOLAM 1 MG/ML IJ SOLN
1 mg/mL | INTRAMUSCULAR | Status: DC | PRN
Start: 2016-10-17 — End: 2016-10-17
  Administered 2016-10-17: 15:00:00 via INTRAVENOUS

## 2016-10-17 MED ORDER — SODIUM CHLORIDE 0.9 % IV
INTRAVENOUS | Status: AC
Start: 2016-10-17 — End: 2016-10-17
  Administered 2016-10-17: 13:00:00 via INTRAVENOUS

## 2016-10-17 MED ORDER — CLOPIDOGREL 300 MG TAB
300 mg | ORAL | Status: DC | PRN
Start: 2016-10-17 — End: 2016-10-17

## 2016-10-17 MED ORDER — ACETAMINOPHEN 325 MG TABLET
325 mg | ORAL | Status: DC | PRN
Start: 2016-10-17 — End: 2016-10-17
  Administered 2016-10-17: 17:00:00 via ORAL

## 2016-10-17 MED ORDER — HEPARIN (PORCINE) IN NS (PF) 1,000 UNIT/500 ML IV
1000 unit/500 mL | Freq: Once | INTRAVENOUS | Status: DC
Start: 2016-10-17 — End: 2016-10-17
  Administered 2016-10-17: 14:00:00

## 2016-10-17 MED ORDER — LIDOCAINE HCL 1 % (10 MG/ML) IJ SOLN
10 mg/mL (1 %) | INTRAMUSCULAR | Status: DC | PRN
Start: 2016-10-17 — End: 2016-10-17
  Administered 2016-10-17: 15:00:00 via SUBCUTANEOUS

## 2016-10-17 MED ORDER — ADV ADDAPTOR
250 mg | Status: DC | PRN
Start: 2016-10-17 — End: 2016-10-17

## 2016-10-17 MED ORDER — MIDAZOLAM 1 MG/ML IJ SOLN
1 mg/mL | INTRAMUSCULAR | Status: AC
Start: 2016-10-17 — End: 2016-10-17
  Administered 2016-10-17: 15:00:00 via INTRAVENOUS

## 2016-10-17 MED ORDER — HEPARIN (PORCINE) IN NS (PF) 1,000 UNIT/500 ML IV
1000 unit/500 mL | Freq: Once | INTRAVENOUS | Status: AC
Start: 2016-10-17 — End: 2016-10-17
  Administered 2016-10-17: 14:00:00

## 2016-10-17 MED ORDER — HEPARIN (PORCINE) 1,000 UNIT/ML IJ SOLN
1000 unit/mL | INTRAMUSCULAR | Status: DC | PRN
Start: 2016-10-17 — End: 2016-10-17

## 2016-10-17 MED ORDER — NITROGLYCERIN 0.1 MG/ML (100 MCG/ML) D5W COMPOUNDED INJECTION
0.1 mg/mL | INTRAVENOUS | Status: DC | PRN
Start: 2016-10-17 — End: 2016-10-17

## 2016-10-17 MED ORDER — IOPAMIDOL 76 % IV SOLN
370 mg iodine /mL (76 %) | INTRAVENOUS | Status: DC | PRN
Start: 2016-10-17 — End: 2016-10-17
  Administered 2016-10-17: 15:00:00 via INTRAVENOUS

## 2016-10-17 MED ORDER — SODIUM CHLORIDE 0.9 % IJ SYRG
Freq: Three times a day (TID) | INTRAMUSCULAR | Status: DC
Start: 2016-10-17 — End: 2016-10-17
  Administered 2016-10-17: 18:00:00 via INTRAVENOUS

## 2016-10-17 MED ORDER — SODIUM CHLORIDE 0.9 % IJ SYRG
INTRAMUSCULAR | Status: DC | PRN
Start: 2016-10-17 — End: 2016-10-17

## 2016-10-17 MED ORDER — SODIUM CHLORIDE 0.9 % IV
INTRAVENOUS | Status: DC
Start: 2016-10-17 — End: 2016-10-17
  Administered 2016-10-17: 14:00:00 via INTRAVENOUS

## 2016-10-17 MED ORDER — FENTANYL CITRATE (PF) 50 MCG/ML IJ SOLN
50 mcg/mL | INTRAMUSCULAR | Status: DC | PRN
Start: 2016-10-17 — End: 2016-10-17
  Administered 2016-10-17: 15:00:00 via INTRAVENOUS

## 2016-10-17 MED ORDER — SODIUM CHLORIDE 0.9 % IV
INTRAVENOUS | Status: DC
Start: 2016-10-17 — End: 2016-10-17

## 2016-10-17 MED ORDER — ATROPINE 0.1 MG/ML SYRINGE
0.1 mg/mL | INTRAMUSCULAR | Status: DC | PRN
Start: 2016-10-17 — End: 2016-10-17

## 2016-10-17 MED ORDER — SALINE PERIPHERAL FLUSH PRN
INTRAMUSCULAR | Status: DC | PRN
Start: 2016-10-17 — End: 2016-10-17

## 2016-10-17 MED FILL — DULOXETINE 60 MG CAP, DELAYED RELEASE: 60 mg | ORAL | Qty: 1

## 2016-10-17 MED FILL — CARVEDILOL 12.5 MG TAB: 12.5 mg | ORAL | Qty: 2

## 2016-10-17 MED FILL — MIDAZOLAM 1 MG/ML IJ SOLN: 1 mg/mL | INTRAMUSCULAR | Qty: 6

## 2016-10-17 MED FILL — SODIUM CHLORIDE 0.9 % IV: INTRAVENOUS | Qty: 1000

## 2016-10-17 MED FILL — LANTUS U-100 INSULIN 100 UNIT/ML SUBCUTANEOUS SOLUTION: 100 unit/mL | SUBCUTANEOUS | Qty: 0.15

## 2016-10-17 MED FILL — PANTOPRAZOLE 40 MG TAB, DELAYED RELEASE: 40 mg | ORAL | Qty: 1

## 2016-10-17 MED FILL — ASPIRIN 81 MG TAB, DELAYED RELEASE: 81 mg | ORAL | Qty: 1

## 2016-10-17 MED FILL — EZETIMIBE 10 MG TAB: 10 mg | ORAL | Qty: 1

## 2016-10-17 MED FILL — NORMAL SALINE FLUSH 0.9 % INJECTION SYRINGE: INTRAMUSCULAR | Qty: 10

## 2016-10-17 MED FILL — GABAPENTIN 300 MG CAP: 300 mg | ORAL | Qty: 1

## 2016-10-17 MED FILL — CHOLECALCIFEROL (VITAMIN D3) 1,000 UNIT (25 MCG) TAB: ORAL | Qty: 2

## 2016-10-17 MED FILL — LIDOCAINE HCL 1 % (10 MG/ML) IJ SOLN: 10 mg/mL (1 %) | INTRAMUSCULAR | Qty: 20

## 2016-10-17 MED FILL — BUMETANIDE 1 MG TAB: 1 mg | ORAL | Qty: 2

## 2016-10-17 MED FILL — NITROGLYCERIN 0.1 MG/ML (100 MCG/ML) D5W COMPOUNDED INJECTION: 0.1 mg/mL | INTRAVENOUS | Qty: 10

## 2016-10-17 MED FILL — LOSARTAN 25 MG TAB: 25 mg | ORAL | Qty: 1

## 2016-10-17 MED FILL — CLOPIDOGREL 75 MG TAB: 75 mg | ORAL | Qty: 1

## 2016-10-17 MED FILL — FENTANYL CITRATE (PF) 50 MCG/ML IJ SOLN: 50 mcg/mL | INTRAMUSCULAR | Qty: 2

## 2016-10-17 MED FILL — ISOVUE-370  76 % INTRAVENOUS SOLUTION: 370 mg iodine /mL (76 %) | INTRAVENOUS | Qty: 400

## 2016-10-17 MED FILL — HEPARIN (PORCINE) IN NS (PF) 1,000 UNIT/500 ML IV: 1000 unit/500 mL | INTRAVENOUS | Qty: 1000

## 2016-10-17 MED FILL — BIVALIRUDIN 250 MG SOLUTION: 250 mg | INTRAVENOUS | Qty: 5

## 2016-10-17 MED FILL — PRAVASTATIN 40 MG TAB: 40 mg | ORAL | Qty: 1

## 2016-10-17 MED FILL — HEPARIN (PORCINE) 1,000 UNIT/ML IJ SOLN: 1000 unit/mL | INTRAMUSCULAR | Qty: 10

## 2016-10-17 MED FILL — PLAVIX 300 MG TABLET: 300 mg | ORAL | Qty: 2

## 2016-10-17 MED FILL — OXYBUTYNIN CHLORIDE SR 5 MG 24 HR TAB: 5 mg | ORAL | Qty: 2

## 2016-10-17 MED FILL — TYLENOL 325 MG TABLET: 325 mg | ORAL | Qty: 2

## 2016-10-17 MED FILL — ATROPINE 0.1 MG/ML SYRINGE: 0.1 mg/mL | INTRAMUSCULAR | Qty: 10

## 2016-10-17 NOTE — Procedures (Signed)
Cardiac Catheterization Procedure Note     Patient: Kelsey [name]  MRN: [id number]  SSN: [id number]   Date of Birth: Aug 09, 1951 Age: 65 y.o.  Sex: female    Date of Procedure: 10/17/2016   Operator:  Dr. Nicholos JohnsStephen Consuela Widener, MD  Assistant(s):  None  Anesthesia: Moderate Sedation   Estimated Blood Loss: Less than 10 mL   Specimens Removed: None     Findings:   LCA:   LAD: 30% proximal after large D1, with competitive inflow from LIMA to mid LAD.  D1 recently stented widely patent  LCX: 80% after small MOM, before large PLOM which has competitive SVG inflow  RCA: occluded mid  LIMA --> LAD widely patent unobstructed anasomosis  SVG --> PLOM widely patent  SVG --> LVBr of distal RCA widely patent, moderate disease proximal to SVG insertion, yielding ischemic potential in small PDA (similar to cath 6 weeks ago, not fixable and VERY unlikely to be problematic).  IMP: CAD post PCI, CABG with very limited ischemic potential, very unlikely to be culprit for ACS.  SUGG: No good explanation for troponin bump, but non-threatening coronary status demonstrated. Continue medical Rx,  May need more BP Rx.  No CV objection to discharge later today.    Complications: None   Implants:  None  Signed by:  Nicholos JohnsStephen Garlan Drewes, MD  10/17/2016  11:37 AM

## 2016-10-17 NOTE — Discharge Summary (Signed)
Physician Discharge Summary     Patient ID:  Kelsey HeadsDiana H [name]  [id number]  65 y.o.  11/11/1951    Admit date: 10/15/2016    Discharge date and time: 10/17/2016    Briefly, pt was admitted with Jaw claudication;CAD (coronary artery disease);Hypertensive*.  For details of admission, see H&P.    Hospital Course:  Patient admitted, troponin bumped to .71. Cardiology consulted. She was taken to cath lab- cardiac cath showed non critical cad,  She developed hematoma right groin. Hemoglobin was 10.0 on discharge.    Discharge Dx: elevated troponin, groin hematoma, diabetes, hypertension. Aortic stenosis    Condition at discharge: stable    Disposition: home    Patient Instructions:   Cannot display discharge medications since this patient is not currently admitted.      Follow-up with Dr. Cherly HensenPahle in 1 week.      Signed:  Lake BellsNancy J Stanford Strauch, MD  10/28/2016  10:01 AM

## 2016-10-17 NOTE — Progress Notes (Signed)
Right Groin site with moderate hematoma and site compressed until soft. Dr Servando SnarePowelson  made aware .    1200: Dr Servando SnarePowelson in to see Patient ; no new orders.

## 2016-10-17 NOTE — Progress Notes (Signed)
No recurrent significant cad  Continue asa and plavix  Ok for dc from cardiac standpoint  Follow up as outpatient with Dr. Dahlia ClientBrowning

## 2016-10-17 NOTE — Progress Notes (Signed)
Dc home with family no unt s/s via wheelchair

## 2016-10-17 NOTE — Progress Notes (Signed)
TRANSFER - IN REPORT:    Verbal report received from KatherineRN(name) on Kelsey [name]  being received from C.brenkeRN(unit) for routine post - op      Report consisted of patient???s Situation, Background, Assessment and   Recommendations(SBAR).     Information from the following report(s) SBAR, Kardex, Procedure Summary and Recent Results was reviewed with the receiving nurse.    Opportunity for questions and clarification was provided.      Assessment completed upon patient???s arrival to unit and care assumed.

## 2016-10-17 NOTE — Progress Notes (Addendum)
1016 -   Cardiac Cath Lab Procedure Area Arrival Note:    Kelsey [name] arrived to Cardiac Cath Lab, Procedure Area. Patient identifiers verified with NAME and DATE OF BIRTH. Procedure verified with patient. Consent forms verified. Allergies verified. Patient informed of procedure and plan of care. Questions answered with review. Patient voiced understanding of procedure and plan of care.    Patient on cardiac monitor, non-invasive blood pressure, SPO2 monitor. PLaced on O2 @ 2 lpm via NC.  IV of NS on pump at 285 ml/hr. Patient status doing well without problems. Patient is A&Ox 4. Patient reports no discomfort at this time.     Patient medicated during procedure with orders obtained and verified by Dr. Servando SnarePowelson.    Refer to patients Cardiac Cath Lab PROCEDURE REPORT for vital signs, assessment, status, and response during procedure, printed at end of case. Printed report on chart or scanned into chart.    1122 - TRANSFER - OUT REPORT:    Verbal report given to KeySpanDaniel RCIS(name) on Kelsey [name]  being transferred to RR(unit) for routine post - op       Report consisted of patient???s Situation, Background, Assessment and   Recommendations(SBAR).     Information from the following report(s) Procedure Summary and MAR was reviewed with the receiving nurse.    Lines:   Peripheral IV 10/15/16 Right Antecubital (Active)   Site Assessment Clean, dry, & intact 10/17/2016  4:05 AM   Phlebitis Assessment 0 10/17/2016  4:05 AM   Infiltration Assessment 0 10/17/2016  4:05 AM   Dressing Status Clean, dry, & intact 10/17/2016  4:05 AM   Dressing Type Tape;Transparent 10/17/2016  4:05 AM   Hub Color/Line Status Pink;Capped 10/17/2016  4:05 AM   Action Taken Open ports on tubing capped 10/17/2016  4:05 AM   Alcohol Cap Used Yes 10/17/2016  4:05 AM        Opportunity for questions and clarification was provided.      Patient transported with:   [employer]

## 2016-10-17 NOTE — Progress Notes (Signed)
CV pre-procedure: Asked by dr Arnoldo LenisGiusti to proceed with cath, PCI prn.  Reviewed chart, recent angiogram, examined patient and discussed plans.  Likely ACS without obvious culprit per recent angiogram/PCI. Best to proceed with cath, etc.  Femoral pulses 2+ bilat, faint pulses below.  Recent Echo LVEF and AoV okay, so no left heart cath.   Indications for Cardiac cath, with PCI prn, were reviewed, along with alternatives. Risks of invasive procedures were discussed, including myocardial infarction, death, CVA, renal or vascular damage, emergency CABG, restenosis. DES again if stented since comited to prolonged DAPT already.  Patient appears to understand and is agreable, denies contrast allergy.

## 2016-10-17 NOTE — Progress Notes (Signed)
Medical Progress Note      NAME: Kelsey [name]   DOB:  January 02, 1951  MRM:  [id number]    Date/Time: 10/17/2016  8:22 AM         Problem List:     Principal Problem:    Jaw claudication (10/16/2016)    Active Problems:    CAD (coronary artery disease) (10/16/2016)      Hypertensive urgency (10/16/2016)             Subjective:     Patient denies any jaw or chest pain since admission.  No sob- but she has been lying in bed    Past Medical History:   Diagnosis Date   ??? Acute MI     x3   ??? Arthritis    ??? CAD (coronary artery disease)    ??? Cervical spondylolysis    ??? Chronic pain    ??? COPD    ??? Diabetes ([place])    ??? GERD (gastroesophageal reflux disease)    ??? Goiter 04/20/2012   ??? Hypertension    ??? Neuropathy 04/06/2013       ROS:  General: negative for fever, chills, sweats, weakness  Respiratory:  negative for cough, sputum production, SOB, wheezing, DOE, pleuritic pain  Cardiology:  negative for chest pain, palpitations, orthopnea, PND, edema, syncope   Gastrointestinal: negative for abdominal pain, N/V, dysphagia, change in bowel habits, bleeding         Objective:       Vitals:          Last 24hrs VS reviewed since prior progress note. Most recent are:    Visit Vitals   ??? BP 156/52 (BP 1 Location: Right arm, BP Patient Position: At rest)   ??? Pulse 67   ??? Temp 98.3 ??F (36.8 ??C)   ??? Resp 18   ??? Ht '5\' 6"'  (1.676 m)   ??? Wt 209 lb 3.5 oz (94.9 kg)   ??? SpO2 92%   ??? Breastfeeding No   ??? BMI 33.77 kg/m2     SpO2 Readings from Last 6 Encounters:   10/17/16 92%   09/04/16 98%   08/29/16 97%   09/26/15 97%   09/18/15 94%   09/13/15 95%          Intake/Output Summary (Last 24 hours) at 10/17/16 0623  Last data filed at 10/16/16 2005   Gross per 24 hour   Intake              350 ml   Output                0 ml   Net              350 ml          Exam:     General   well developed, well nourished, appears stated age, in no acute distress  Neck   Supple without nodes or mass. No thyromegaly or bruit  Respiratory   Coarse breath sounds   Cardiology  2/6 asm  Abdominal  Soft, non-tender, non-distended, positive bowel sounds, no hepatosplenomegaly  Extremities  No clubbing, cyanosis, or edema. Pulses intact.    Lab Data Reviewed: (see below)    Medications Reviewed: (see below)    ______________________________________________________________________    Medications:     Current Facility-Administered Medications   Medication Dose Route Frequency   ??? 0.9% sodium chloride infusion  285 mL/hr IntraVENous CONTINUOUS   ??? 0.9% sodium chloride infusion  143 mL/hr IntraVENous CONTINUOUS   ??? sodium chloride (NS) flush 5-10 mL  5-10 mL IntraVENous Q8H   ??? sodium chloride (NS) flush 5-10 mL  5-10 mL IntraVENous PRN   ??? aspirin delayed-release tablet 81 mg  81 mg Oral DAILY   ??? bumetanide (BUMEX) tablet 2 mg  2 mg Oral DAILY   ??? carvedilol (COREG) tablet 25 mg  25 mg Oral BID   ??? cholecalciferol (VITAMIN D3) tablet 2,000 Units  2,000 Units Oral DAILY   ??? clopidogrel (PLAVIX) tablet 75 mg  75 mg Oral DAILY   ??? DULoxetine (CYMBALTA) capsule 60 mg  60 mg Oral DAILY   ??? ezetimibe (ZETIA) tablet 10 mg  10 mg Oral DAILY   ??? losartan (COZAAR) tablet 25 mg  25 mg Oral DAILY   ??? pantoprazole (PROTONIX) tablet 40 mg  40 mg Oral ACB   ??? pravastatin (PRAVACHOL) tablet 40 mg  40 mg Oral QHS   ??? oxybutynin chloride XL (DITROPAN XL) tablet 10 mg  10 mg Oral DAILY   ??? gabapentin (NEURONTIN) capsule 300 mg  300 mg Oral QID   ??? insulin glargine (LANTUS) injection 15 Units  15 Units SubCUTAneous QHS   ??? nitroglycerin (NITROSTAT) tablet 0.4 mg  0.4 mg SubLINGual Q5MIN PRN            Lab Review:     Recent Labs      10/17/16   0412  10/16/16   0336  10/15/16   2324   WBC  7.5  7.6  8.6   HGB  10.0*  9.6*  10.5*   HCT  31.0*  30.5*  33.0*   PLT  247  250  255     Recent Labs      10/17/16   0412  10/16/16   1447  10/16/16   0336  10/15/16   2324   NA  134*   --   141  137   K  4.3  4.4  3.0*  4.3   CL  100   --   112*  100   CO2  27   --   21  27   GLU  136*   --   91  144*    BUN  18   --   12  17   CREA  0.94   --   0.50*  0.86   CA  8.0*   --   6.6*  8.7   MG  2.1  2.6*  1.1*   --    ALB   --    --    --   3.5   TBILI   --    --    --   0.2   SGOT   --    --    --   11*   ALT   --    --    --   19     Lab Results   Component Value Date/Time    Glucose (POC) 156 10/16/2016 09:37 PM    Glucose (POC) 153 08/29/2016 09:02 AM    Glucose (POC) 217 12/23/2012 12:14 PM    Glucose (POC) 202 12/23/2012 08:08 AM    Glucose (POC) 142 03/13/2011 09:49 PM     No results for input(s): PH, PCO2, PO2, HCO3, FIO2 in the last 72 hours.  No results for input(s): INR in the last 72 hours.    No lab exists for  component: INREXT    Other pertinent lab: NA         Assessment:     Patient Active Problem List   Diagnosis Code   ??? Coronary atherosclerosis of native coronary artery I25.10   ??? Other chest pain R07.89   ??? Other dyspnea and respiratory abnormality R06.09, R09.89   ??? Mixed hyperlipidemia E78.2   ??? Type II diabetes mellitus, uncontrolled (HCC) E11.65   ??? Dyspnea R06.00   ??? Goiter E04.9   ??? Neuropathy G62.9   ??? Long term (current) use of insulin (HCC) Z79.4   ??? Hypertension complicating diabetes ([place]) E11.59, I10   ??? CAD (coronary artery disease) I25.10   ??? 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 Hypertensive urgency I16.0          Plan:                 1. Cad- for cardiac cath today,  Troponin peaked at .71  2. Diabetes- decreased insulin since she is npo this am  3. htn- continue meds  4. Neuropathy- increase neurontin at her request                ___________________________________________________    Attending Physician: Genene Churn, MD

## 2016-10-17 NOTE — ED Provider Notes (Signed)
HPI Comments: 65 y.o. female with past medical history significant for MI x3, CABG, Cardiac cath, HTN, COPD, DM, GERD who presents from home for evaluation of SOB and post-op complication. Pt reports receiving a cardiac cath this morning and was discharged. She has had increased pain, swelling and bruising to right groin. Bruising has moderately worsened over the medial aspect of her upper right thigh. Tonight, while ambulating from bedroom to bathroom approximately 1-1.5 hours ago she became moderately SOB. Upon returning to bed, symptoms persisted ultimately leading up to ED visit. She has no SOB at rest. Pt denies chest pain, cough, calf pain, back pain, neck pain, abdominal pain, nausea or vomiting, numbness or weakness to extremities. No jaw pain today. There are no other acute medical concerns at this time.     Chart review: cardiac cath 2 days ago by Dr. Rocco SerenePowellson. No intervention.     Social hx: + Smoker ("1-2 cigarettes per day")  PCP: Lake BellsNancy J Pahle, MD  Cardiology: Marcellus Scotthristine M Browning, MD     Note written by Terie PurserArielle L. Johnson, Scribe, as dictated by Volanda NapoleonJeffrey S Albie Bazin, DO 11:57 PM    The history is provided by the patient. No language interpreter was used.      Past Medical History:   Diagnosis Date   ??? Acute MI     x3   ??? Arthritis    ??? CAD (coronary artery disease)    ??? Cervical spondylolysis    ??? Chronic pain    ??? COPD    ??? Diabetes (HCC)    ??? GERD (gastroesophageal reflux disease)    ??? Goiter 04/20/2012   ??? Hypertension    ??? Neuropathy 04/06/2013       Past Surgical History:   Procedure Laterality Date   ??? 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 CARDIAC SURG PROCEDURE UNLIST      PTCA   ??? HX KNEE ARTHROSCOPY Right     X2   ??? HX OTHER SURGICAL  1970    GANGLION CYST   ??? HX TONSILLECTOMY  1963         Family History:   Problem Relation Age of Onset   ??? Lung Disease Mother    ??? Stroke Father    ??? Lung Disease Brother      COPD   ??? Hypertension Brother    ??? Cancer Brother      PROSTATE    ??? Heart Disease Brother    ??? Diabetes Brother    ??? Anesth Problems Neg Hx        Social History     Social History   ??? Marital status: MARRIED     Spouse name: N/A   ??? Number of children: N/A   ??? Years of education: N/A     Occupational History   ??? Not on file.     Social History Main Topics   ??? Smoking status: Current Every Day Smoker     Packs/day: 0.30     Years: 45.00     Types: Cigarettes   ??? Smokeless tobacco: Never Used      Comment: passed ready   ??? Alcohol use Yes      Comment: may be 3 times a year if that   ??? Drug use: No   ??? Sexual activity: Not on file     Other Topics Concern   ??? Not on file     Social History Narrative  ALLERGIES: Morphine; Avalide [irbesartan-hydrochlorothiazide]; Betadine [povidone-iodine]; Demerol [meperidine]; Pcn [penicillins]; Prinivil [lisinopril]; Statins-hmg-coa reductase inhibitors; Sulfa (sulfonamide antibiotics); and Tricor [fenofibrate micronized]    Review of Systems   Respiratory: Positive for shortness of breath (DOE). Negative for cough.    Cardiovascular: Negative for chest pain.   Gastrointestinal: Negative for abdominal pain, nausea and vomiting.   Musculoskeletal: Negative for back pain and neck pain.        - calf pain   Neurological: Negative for weakness and numbness.   Hematological: Bruises/bleeds easily (right groin, right upper thigh s/p cardiac cath).   All other systems reviewed and are negative.      Vitals:    10/17/16 2316 10/17/16 2325   BP:  140/63   Pulse:  72   Resp:  14   Temp:  97.6 ??F (36.4 ??C)   SpO2:  100%   Weight: 94.8 kg (209 lb)    Height: 5\' 6"  (1.676 m)             Physical Exam   Skin:           Constitutional: Pt is awake and alert.  Pt appears well-developed and well-nourished. NAD.  HENT:   Head: Normocephalic and atraumatic.   Nose: Nose normal.   Mouth/Throat: Oropharynx is clear and moist. No oropharyngeal exudate.   Eyes: Conjunctivae and extraocular motions are normal. Pupils are equal,  round, and reactive to light. Right eye exhibits no discharge. Left eye exhibits no discharge. No scleral icterus.   Neck: No tracheal deviation present. Supple neck.  Cardiovascular: Normal rate, regular rhythm, normal heart sounds and intact distal pulses.  Exam reveals no gallop and no friction rub.    No murmur heard.  Pulmonary/Chest: Effort normal and breath sounds normal.  Pt  has no wheezes.  Pt  has no rales.   Abdominal: Soft.  Pt  exhibits no distension and no mass. No tenderness.  Pt  has no rebound and no guarding.   Musculoskeletal:  Pt  exhibits no edema and no tenderness. Calves soft.   Ext: Normal ROM in all four extremities; not tender to palpation; distal pulses are normal, no edema.   Neurological:  Pt is alert.  nonfocal neuro exam.  Skin: Skin is warm and dry.  Pt  is not diaphoretic. See above.   Psychiatric:  Pt  has a normal mood and affect. Behavior is normal.   Note written by Terie Purser, Scribe, as dictated by Volanda Napoleon, DO 12:04 AM    MDM  ED Course       Procedures    ED EKG interpretation:  Rhythm: normal sinus rhythm; and regular . Rate (approx.): 76;  biventricular block. No acute St T wave changes noted. When compared to EKG performed 10/15/16, unchanged.   Note written by Terie Purser, Scribe, as dictated by Volanda Napoleon, DO 11:24 PM        1:28 AM - US shows no pseudoaneurysm.   Groin hematoma.  hgb is 8.      Labs Reviewed   CBC W/O DIFF - Abnormal; Notable for the following:        Result Value    RBC 2.97 (*)     HGB 7.9 (*)     HCT 24.9 (*)     RDW 14.9 (*)     All other components within normal limits   METABOLIC PANEL, BASIC - Abnormal; Notable for the following:     Sodium  135 (*)     Glucose 152 (*)     Creatinine 1.06 (*)     GFR est non-AA 52 (*)     Calcium 8.0 (*)     All other components within normal limits   TROPONIN I - Abnormal; Notable for the following:     Troponin-I, Qt. 0.29 (*)     All other components within normal limits    HGB & HCT - Abnormal; Notable for the following:     HGB 7.9 (*)     HCT 25.0 (*)     All other components within normal limits   SAMPLES BEING HELD   SAMPLE TO BLOOD BANK

## 2016-10-17 NOTE — Progress Notes (Addendum)
No recurrent jaw pain but troponin slightly more elevated  Visit Vitals   ??? BP 164/77 (BP 1 Location: Right arm, BP Patient Position: At rest)   ??? Pulse 67   ??? Temp 98.2 ??F (36.8 ??C)   ??? Resp 18   ??? Ht 5\' 6"  (1.676 m)   ??? Wt 94.9 kg (209 lb 3.5 oz)   ??? SpO2 94%   ??? Breastfeeding No   ??? BMI 33.77 kg/m2     Physical Exam   Blood pressure 164/77, pulse 67, temperature 98.2 ??F (36.8 ??C), resp. rate 18, height 5\' 6"  (1.676 m), weight 94.9 kg (209 lb 3.5 oz), SpO2 94 %, not currently breastfeeding.    Psychiatric:  Normal mood and affect.  Behavior is normal.   No current facility-administered medications on file prior to encounter.      Current Outpatient Prescriptions on File Prior to Encounter   Medication Sig Dispense Refill   ??? losartan (COZAAR) 25 mg tablet Take 1 Tab by mouth daily. 30 Tab 1   ??? pravastatin (PRAVACHOL) 40 mg tablet Take 1 Tab by mouth nightly. 90 Tab 4   ??? carvedilol (COREG) 25 mg tablet Take 1 Tab by mouth two (2) times a day. 180 Tab 3   ??? nitroglycerin (NITROSTAT) 0.4 mg SL tablet 1 Tab by SubLINGual route every five (5) minutes as needed for Chest Pain for up to 3 doses. 1 Bottle 1   ??? nabumetone (RELAFEN) 500 mg tablet Take 500 mg by mouth two (2) times a day.     ??? clopidogrel (PLAVIX) 75 mg tab Take 1 Tab by mouth daily for 360 days. 30 Tab 11   ??? methocarbamol (ROBAXIN) 500 mg tablet Take 1,000 mg by mouth two (2) times daily as needed. Patient take by mouth as needed     ??? Cholecalciferol, Vitamin D3, (VITAMIN D3) 2,000 unit cap capsule One capsule by mouth daily 90 Cap 3   ??? aspirin delayed-release 81 mg tablet Take 1 Tab by mouth daily. 90 Tab 3   ??? insulin detemir (LEVEMIR FLEXTOUCH) 100 unit/mL (3 mL) inpn 38 Units by SubCUTAneous route nightly.     ??? VESICARE 10 mg tablet TAKE 1 TAB BY MOUTH DAILY. 90 Tab 4   ??? ezetimibe (ZETIA) 10 mg tablet TAKE 1 TAB BY MOUTH NIGHTLY. 90 Tab 3   ??? DULoxetine (CYMBALTA) 60 mg capsule TAKE 1 CAPSULE BY MOUTH DAILY 90 Cap 2    ??? pantoprazole (PROTONIX) 40 mg tablet TAKE 1 TABLET BY MOUTH DAILY 90 Tab 3   ??? bumetanide (BUMEX) 2 mg tablet Take 2 mg by mouth daily.       Lab Results   Component Value Date/Time    WBC 7.5 10/17/2016 04:12 AM    HGB (POC) 12.7 09/26/2015 03:36 PM    HGB 10.0 10/17/2016 04:12 AM    HCT (POC) 39.6 09/26/2015 03:36 PM    HCT 31.0 10/17/2016 04:12 AM    PLATELET 247 10/17/2016 04:12 AM    MCV 83.1 10/17/2016 04:12 AM     Lab Results   Component Value Date/Time    Sodium 134 10/17/2016 04:12 AM    Potassium 4.3 10/17/2016 04:12 AM    Chloride 100 10/17/2016 04:12 AM    CO2 27 10/17/2016 04:12 AM    Anion gap 7 10/17/2016 04:12 AM    Glucose 136 10/17/2016 04:12 AM    BUN 18 10/17/2016 04:12 AM    Creatinine 0.94 10/17/2016 04:12 AM  BUN/Creatinine ratio 19 10/17/2016 04:12 AM    GFR est AA >60 10/17/2016 04:12 AM    GFR est non-AA 60 10/17/2016 04:12 AM    Calcium 8.0 10/17/2016 04:12 AM       A/P: recurrent angina post PCI of D1  Proceed with cardiac catheterization as discussed yesterday

## 2016-10-17 NOTE — Progress Notes (Signed)
TRANSFER - IN REPORT:    Verbal report received from Daniel CVT on Kelsey [name]  being received from procedure for routine progression of care. Report consisted of patient???s Situation, Background, Assessment and Recommendations(SBAR). Information from the following report(s) SBAR, Procedure Summary, Recent Results and Cardiac Rhythm NSR was reviewed with the receiving clinician. Opportunity for questions and clarification was provided. Assessment completed upon patient???s arrival to Cardiac Cath Lab RECOVERY AREA and care assumed.       Cardiac Cath Lab Recovery Arrival Note:    Kelsey [name] arrived to CCL recovery area.  Patient procedure= LHC. Patient on cardiac monitor, non-invasive blood pressure, SPO2 monitor. On Room air .  IV  of NS on pump at 143 ml/hr. Patient status doing well without problems. Patient is A&Ox 4. Patient reports No Pain.    PROCEDURE SITE CHECK:    Procedure site:without any bleeding and moderate Hematoma, No pain/discomfort reported at procedure site.     No change in patient status. Continue to monitor patient and status.

## 2016-10-17 NOTE — Progress Notes (Signed)
TRANSFER - OUT REPORT:    Verbal report given to Cecile RNon Ladell HeadsDiana H Mcgaugh being transferred to Room 402 for routine progression of care       Report consisted of patient???s Situation, Background, Assessment and   Recommendations(SBAR).     Information from the following report(s) SBAR, Recent Results and Cardiac Rhythm NSR was reviewed with the receiving nurse.    Opportunity for questions and clarification was provided.

## 2016-10-17 NOTE — ED Triage Notes (Signed)
Patient presents to the emergency department reporting shortness of breath onset about an hour ago.  Patient reports having a cardiac cath performed today, and now has a large hematoma to the side, right groin.  Patient denies chest pain or they typical pain she associates with cardiac issues.  Procedure was performed by [name],and [name] is following her.

## 2016-10-17 NOTE — Discharge Summary (Signed)
Physician Discharge Summary     Patient ID:  Amiley H Pasqua  [id number]  64 y.o.  11/22/1951    Admit date: 10/15/2016    Discharge date and time: 10/17/2016    Briefly, pt was admitted with Jaw claudication;CAD (coronary artery disease);Hypertensive*.  For details of admission, see H&P.    Hospital Course:  Patient admitted, troponin bumped to .71. Cardiology consulted. She was taken to cath lab- cardiac cath showed non critical cad,  She developed hematoma right groin. Hemoglobin was 10.0 on discharge.    Discharge Dx: elevated troponin, groin hematoma, diabetes, hypertension. Aortic stenosis    Condition at discharge: stable    Disposition: home    Patient Instructions:   Cannot display discharge medications since this patient is not currently admitted.      Follow-up with Dr. Amberly Livas in 1 week.      Signed:  Meara Wiechman J Jotham Ahn, MD  10/28/2016  10:01 AM

## 2016-10-17 NOTE — Progress Notes (Signed)
Pt has right groin hematoma -MD aware and seen pt in cath lab about the hematoma-pt stated she has chronic issue post cath before -monitored closely -no progression noted

## 2016-10-17 NOTE — Procedures (Signed)
Cardiac Catheterization Procedure Note     Patient: Kelsey [name]  MRN: [id number]  SSN: [id number]   Date of Birth: 07/01/1951 Age: 64 y.o.  Sex: female    Date of Procedure: 10/17/2016   Operator:  Dr. Shayde Gervacio, MD  Assistant(s):  None  Anesthesia: Moderate Sedation   Estimated Blood Loss: Less than 10 mL   Specimens Removed: None     Findings:   LCA:   LAD: 30% proximal after large D1, with competitive inflow from LIMA to mid LAD.  D1 recently stented widely patent  LCX: 80% after small MOM, before large PLOM which has competitive SVG inflow  RCA: occluded mid  LIMA --> LAD widely patent unobstructed anasomosis  SVG --> PLOM widely patent  SVG --> LVBr of distal RCA widely patent, moderate disease proximal to SVG insertion, yielding ischemic potential in small PDA (similar to cath 6 weeks ago, not fixable and VERY unlikely to be problematic).  IMP: CAD post PCI, CABG with very limited ischemic potential, very unlikely to be culprit for ACS.  SUGG: No good explanation for troponin bump, but non-threatening coronary status demonstrated. Continue medical Rx,  May need more BP Rx.  No CV objection to discharge later today.    Complications: None   Implants:  None  Signed by:  Anushka Hartinger, MD  10/17/2016  11:37 AM

## 2016-10-17 NOTE — Progress Notes (Addendum)
Problem: Falls - Risk of  Goal: *Absence of Falls  Document Schmid Fall Risk and appropriate interventions in the flowsheet.   Outcome: Progressing Towards Goal  Fall Risk Interventions:  Mobility Interventions: Patient to call before getting OOB, Utilize walker, cane, or other assitive device         Medication Interventions: Patient to call before getting OOB, Teach patient to arise slowly         History of Falls Interventions: Door open when patient unattended, Investigate reason for fall        Bedside shift change report given to Basil Dessecile, Charity fundraiserN (Cabin crewoncoming nurse) by Adelina MingsKelsey, RN (offgoing nurse). Report included the following information SBAR, Kardex, Intake/Output, MAR, Recent Results and Cardiac Rhythm NSR with BBB.

## 2016-10-17 NOTE — Progress Notes (Signed)
TRANSFER - IN REPORT:    Verbal report received from Cecile RN(name) on Ladell HeadsDiana H Shafer  being received from Room  402(unit) for routine progression of care      Report consisted of patient???s Situation, Background, Assessment and   Recommendations(SBAR).     Information from the following report(s) SBAR, Recent Results and Cardiac Rhythm Nsr was reviewed with the receiving nurse.    Opportunity for questions and clarification was provided.      Assessment completed upon patient???s arrival to unit and care assumed.

## 2016-10-17 NOTE — Other (Signed)
Cardiac Wellness: Follow up post cath visit. Handouts on cardiac medications given. Discussed cardiac rehab and her calling us when she is ready to participate as she is planning on hip surgery soon. She says she has our number.

## 2016-10-18 ENCOUNTER — Inpatient Hospital Stay
Admit: 2016-10-18 | Discharge: 2016-10-18 | Disposition: A | Discharge status: Home / Self Care | Payer: MEDICARE | Attending: Emergency Medicine

## 2016-10-18 LAB — EKG 12-LEAD
Atrial Rate: 76 {beats}/min
Diagnosis: NORMAL
P Axis: 81 degrees
P-R Interval: 186 ms
Q-T Interval: 442 ms
QRS Duration: 128 ms
QTc Calculation (Bazett): 497 ms
R Axis: -66 degrees
T Axis: 49 degrees
Ventricular Rate: 76 {beats}/min

## 2016-10-18 LAB — CBC W/O DIFF
HCT: 24.9 % — ABNORMAL LOW (ref 35.0–47.0)
HGB: 7.9 g/dL — ABNORMAL LOW (ref 11.5–16.0)
MCH: 26.6 PG (ref 26.0–34.0)
MCHC: 31.7 g/dL (ref 30.0–36.5)
MCV: 83.8 FL (ref 80.0–99.0)
PLATELET: 234 10*3/uL (ref 150–400)
RBC: 2.97 M/uL — ABNORMAL LOW (ref 3.80–5.20)
RDW: 14.9 % — ABNORMAL HIGH (ref 11.5–14.5)
WBC: 8.3 10*3/uL (ref 3.6–11.0)

## 2016-10-18 LAB — EKG, 12 LEAD, INITIAL
Atrial Rate: 76 {beats}/min
Calculated P Axis: 81 degrees
Calculated R Axis: -66 degrees
Calculated T Axis: 49 degrees
Diagnosis: NORMAL
P-R Interval: 186 ms
Q-T Interval: 442 ms
QRS Duration: 128 ms
QTC Calculation (Bezet): 497 ms
Ventricular Rate: 76 {beats}/min

## 2016-10-18 LAB — HGB & HCT
HCT: 25 % — ABNORMAL LOW (ref 35.0–47.0)
HGB: 7.9 g/dL — ABNORMAL LOW (ref 11.5–16.0)

## 2016-10-18 LAB — METABOLIC PANEL, BASIC
Anion gap: 10 mmol/L (ref 5–15)
BUN/Creatinine ratio: 18 (ref 12–20)
BUN: 19 MG/DL (ref 6–20)
CO2: 25 mmol/L (ref 21–32)
Calcium: 8 MG/DL — ABNORMAL LOW (ref 8.5–10.1)
Chloride: 100 mmol/L (ref 97–108)
Creatinine: 1.06 MG/DL — ABNORMAL HIGH (ref 0.55–1.02)
GFR est AA: >60 mL/min/{1.73_m2} (ref >60)
GFR est non-AA: 52 mL/min/{1.73_m2} — ABNORMAL LOW (ref >60)
Glucose: 152 mg/dL — ABNORMAL HIGH (ref 65–100)
Potassium: 4.4 mmol/L (ref 3.5–5.1)
Sodium: 135 mmol/L — ABNORMAL LOW (ref 136–145)

## 2016-10-18 LAB — TROPONIN I: Troponin-I, Qt.: 0.29 ng/mL — ABNORMAL HIGH (ref <0.05)

## 2016-10-18 MED ORDER — TRAMADOL 50 MG TAB
50 mg | ORAL_TABLET | Freq: Four times a day (QID) | ORAL | 0 refills | Status: DC | PRN
Start: 2016-10-18 — End: 2016-10-21

## 2016-10-18 NOTE — ED Notes (Addendum)
0215: Assumed care of patient, pt is on monitor x3 and has no further needs at this time. Pt updated on plan of care to redraw labs at 0245.    0300: Pt wheeled to restroom and reported mild SOB with exertion.     0320: MD reviewed discharge instructions and options with patient and patient verbalized understanding. RN reviewed discharge instructions using teachback method. Pt ambulated to exit without difficulty and in no signs of acute distress escorted by friend who will drive home. No complaints or needs expressed at this time.

## 2016-10-18 NOTE — Procedures (Signed)
[facility]  *** FINAL REPORT ***    Name: Kelsey [name], Kelsey [name]  MRN: [id number]  DOB: 09 Dec 1951  HIS Order #: [id number]  TRAKnet Visit #: [id number]  Date: 18 Oct 2016    TYPE OF TEST: Extremity Arterial Duplex    REASON FOR TEST  Pseudoaneurysm (right side)                            Right                     Left  Artery               PSV   Finding             PSV   Finding  ------------------  -----  ---------------    -----  ---------------  External iliac:  Common femoral:      92.0  Profunda femoris:    65.0  Proximal SFA:       130.0  Mid SFA:  Distal SFA:  Popliteal:  Anterior tibial:  Posterior tibial:    Pressures               Right     Left               -----     -----     Brachial:           DP:           PT:            ABI:            Toe:      INTERPRETATION/FINDINGS  PROCEDURE:  Color duplex ultrasound imaging of lower extremity  arteries.  The exam may include pulse volume recording (PVR)  plethysmography at the ankle and/or measurement of systolic blood  pressure at the ankle and brachial level with calculation of the  ankle/brachial pressure index (ABI), if indicated.    FINDINGS:  No extravascular area with evidence of flow is identified  at the right groin.  The common femoral, deep femoral, superficial  femoral, popliteal, posterior tibial, and anterior tibial arteries are   visualized.  Color Doppler imaging demonstrates no significant  narrowing of the right flow channel.  Velocity is not significantly  elevated.    IMPRESSION:  There is no evidence of a right groin pseudoaneurysm.  There is no evidence of hemodynamically significant right lower  extremity arterial obstruction.    ADDITIONAL COMMENTS  Appears to be a hematoma in right groin measuring about 4.4cm.    I have personally reviewed the data relevant to the interpretation of  this  study.    TECHNOLOGIST: Lakeysha Harris, RVS, RCS  Signed: 10/18/2016 01:36 AM    PHYSICIAN: Rylan Bernard, MD  Signed: 10/20/2016 06:26 AM

## 2016-10-20 ENCOUNTER — Inpatient Hospital Stay
Admit: 2016-10-20 | Discharge: 2016-10-21 | Disposition: A | Discharge status: Home / Self Care | Payer: MEDICARE | Attending: Internal Medicine | Admitting: Internal Medicine

## 2016-10-20 ENCOUNTER — Ambulatory Visit: Admit: 2016-10-20 | Payer: MEDICARE | Attending: Internal Medicine | Primary: Internal Medicine

## 2016-10-20 DIAGNOSIS — D649 Anemia, unspecified: Secondary | ICD-10-CM

## 2016-10-20 DIAGNOSIS — E782 Mixed hyperlipidemia: Secondary | ICD-10-CM

## 2016-10-20 LAB — METABOLIC PANEL, COMPREHENSIVE
A-G Ratio: 1 — ABNORMAL LOW (ref 1.1–2.2)
ALT (SGPT): 16 U/L (ref 12–78)
AST (SGOT): 12 U/L — ABNORMAL LOW (ref 15–37)
Albumin: 3.2 g/dL — ABNORMAL LOW (ref 3.5–5.0)
Alk. phosphatase: 89 U/L (ref 45–117)
Anion gap: 10 mmol/L (ref 5–15)
BUN/Creatinine ratio: 16 (ref 12–20)
BUN: 17 MG/DL (ref 6–20)
Bilirubin, total: 0.3 MG/DL (ref 0.2–1.0)
CO2: 23 mmol/L (ref 21–32)
Calcium: 8.6 MG/DL (ref 8.5–10.1)
Chloride: 99 mmol/L (ref 97–108)
Creatinine: 1.09 MG/DL — ABNORMAL HIGH (ref 0.55–1.02)
GFR est AA: >60 mL/min/{1.73_m2} (ref >60)
GFR est non-AA: 50 mL/min/{1.73_m2} — ABNORMAL LOW (ref >60)
Globulin: 3.1 g/dL (ref 2.0–4.0)
Glucose: 141 mg/dL — ABNORMAL HIGH (ref 65–100)
Potassium: 4.6 mmol/L (ref 3.5–5.1)
Protein, total: 6.3 g/dL — ABNORMAL LOW (ref 6.4–8.2)
Sodium: 132 mmol/L — ABNORMAL LOW (ref 136–145)

## 2016-10-20 LAB — CBC W/O DIFF
HCT: 22.4 % — ABNORMAL LOW (ref 35.0–47.0)
HGB: 7 g/dL — ABNORMAL LOW (ref 11.5–16.0)
MCH: 26.2 PG (ref 26.0–34.0)
MCHC: 31.3 g/dL (ref 30.0–36.5)
MCV: 83.9 FL (ref 80.0–99.0)
PLATELET: 246 10*3/uL (ref 150–400)
RBC: 2.67 M/uL — ABNORMAL LOW (ref 3.80–5.20)
RDW: 15.7 % — ABNORMAL HIGH (ref 11.5–14.5)
WBC: 9.7 10*3/uL (ref 3.6–11.0)

## 2016-10-20 LAB — AMB POC URINALYSIS DIP STICK MANUAL W/O MICRO
Bilirubin (UA POC): NEGATIVE
Blood (UA POC): NEGATIVE
Glucose (UA POC): NEGATIVE
Ketones (UA POC): NEGATIVE
Leukocyte esterase (UA POC): NEGATIVE
Nitrites (UA POC): NEGATIVE
Protein (UA POC): NEGATIVE mg/dL
Specific gravity (UA POC): 1.01 (ref 1.001–1.035)
Urobilinogen (UA POC): 0.2 (ref 0.2–1)
pH (UA POC): 5.5 (ref 4.6–8.0)

## 2016-10-20 LAB — AMB POC COMPLETE CBC,AUTOMATED ENTER
ABS. GRANS (POC): 7.4 10*3/uL — AB (ref 1.4–6.5)
ABS. LYMPHS (POC): 2.4 10*3/uL (ref 1.2–3.4)
ABS. MONOS (POC): 0.5 10*3/uL (ref 0.1–0.6)
GRANULOCYTES (POC): 72.1 % (ref 42.2–75.2)
HCT (POC): 21.5 % — AB (ref 35.0–60.0)
HGB (POC): 6.9 g/dL — AB (ref 11–18)
LYMPHOCYTES (POC): 23.4 % (ref 20.5–51.1)
MCH (POC): 26.5 pg — AB (ref 27.0–31.0)
MCHC (POC): 32.2 g/dL — AB (ref 33.0–37.0)
MCV (POC): 82.2 fL (ref 80.0–99.9)
MONOCYTES (POC): 4.5 % (ref 1.7–9.3)
MPV (POC): 6.4 fL — AB (ref 7.8–11)
PLATELET (POC): 230 10*3/uL (ref 150–450)
RBC (POC): 2.61 M/uL — AB (ref 4.00–6.00)
RDW (POC): 16.2 % — AB (ref 11.6–13.7)
WBC (POC): 10.3 10*3/uL (ref 4.5–10.5)

## 2016-10-20 MED ORDER — SODIUM CHLORIDE 0.9 % IV
INTRAVENOUS | Status: DC | PRN
Start: 2016-10-20 — End: 2016-10-21

## 2016-10-20 MED FILL — SODIUM CHLORIDE 0.9 % IV: INTRAVENOUS | Qty: 250

## 2016-10-20 NOTE — Procedures (Signed)
[facility]  *** FINAL REPORT ***    Name: Kelsey [name], Kelsey [name]  MRN: [id number]  DOB: 09 Dec 1951  HIS Order #: [id number]  TRAKnet Visit #: [id number]  Date: 18 Oct 2016    TYPE OF TEST: Extremity Arterial Duplex    REASON FOR TEST  Pseudoaneurysm (right side)                            Right                     Left  Artery               PSV   Finding             PSV   Finding  ------------------  -----  ---------------    -----  ---------------  External iliac:  Common femoral:      92.0  Profunda femoris:    65.0  Proximal SFA:       130.0  Mid SFA:  Distal SFA:  Popliteal:  Anterior tibial:  Posterior tibial:    Pressures               Right     Left               -----     -----     Brachial:           DP:           PT:            ABI:            Toe:      INTERPRETATION/FINDINGS  PROCEDURE:  Color duplex ultrasound imaging of lower extremity  arteries.  The exam may include pulse volume recording (PVR)  plethysmography at the ankle and/or measurement of systolic blood  pressure at the ankle and brachial level with calculation of the  ankle/brachial pressure index (ABI), if indicated.    FINDINGS:  No extravascular area with evidence of flow is identified  at the right groin.  The common femoral, deep femoral, superficial  femoral, popliteal, posterior tibial, and anterior tibial arteries are   visualized.  Color Doppler imaging demonstrates no significant  narrowing of the right flow channel.  Velocity is not significantly  elevated.    IMPRESSION:  There is no evidence of a right groin pseudoaneurysm.  There is no evidence of hemodynamically significant right lower  extremity arterial obstruction.    ADDITIONAL COMMENTS  Appears to be a hematoma in right groin measuring about 4.4cm.    I have personally reviewed the data relevant to the interpretation of  this  study.    TECHNOLOGIST: Virgina OrganLakeysha Harris, RVS, RCS  Signed: 10/18/2016 01:36 AM    PHYSICIAN: Gaetana MichaelisGregg Shakya Sebring, MD  Signed: 10/20/2016 06:26 AM

## 2016-10-20 NOTE — ED Notes (Signed)
Pt to doppler via wheelchair & transportation

## 2016-10-20 NOTE — Progress Notes (Signed)
Kelsey [name] is a 65 y.o. female and presents with   Chief Complaint   Patient presents with   ??? Hospital Follow Up   .  Patient  Feeling lightheaded and sob,  Denies chest pain.  Went to ER Sat and hemoglobin  8.0  .. Was discharged home.  Took normal meds this am and nearly fainted .  She is extremely sob- slight cough. No fever or chills.      Current Outpatient Prescriptions   Medication Sig Dispense Refill   ??? traMADol (ULTRAM) 50 mg tablet Take 1.25-2 Tabs by mouth every six (6) hours as needed for Pain. Max Daily Amount: 400 mg. 20 Tab 0   ??? gabapentin (NEURONTIN) 300 mg capsule Take 300 mg by mouth daily.     ??? gabapentin (NEURONTIN) 300 mg capsule Take 1,200 mg by mouth nightly.     ??? metFORMIN (GLUMETZA ER) 500 mg TG24 24 hour tablet Take 1,000 mg by mouth two (2) times a day.     ??? traMADol (ULTRAM) 50 mg tablet Take 50 mg by mouth every six (6) hours. With tylenol     ??? acetaminophen (ACETAMINOPHEN EXTRA STRENGTH) 500 mg tablet Take 1,000 mg by mouth every six (6) hours. With tramadol     ??? losartan (COZAAR) 25 mg tablet Take 1 Tab by mouth daily. 30 Tab 1   ??? pravastatin (PRAVACHOL) 40 mg tablet Take 1 Tab by mouth nightly. 90 Tab 4   ??? carvedilol (COREG) 25 mg tablet Take 1 Tab by mouth two (2) times a day. 180 Tab 3   ??? nitroglycerin (NITROSTAT) 0.4 mg SL tablet 1 Tab by SubLINGual route every five (5) minutes as needed for Chest Pain for up to 3 doses. 1 Bottle 1   ??? nabumetone (RELAFEN) 500 mg tablet Take 500 mg by mouth two (2) times a day.     ??? clopidogrel (PLAVIX) 75 mg tab Take 1 Tab by mouth daily for 360 days. 30 Tab 11   ??? methocarbamol (ROBAXIN) 500 mg tablet Take 1,000 mg by mouth two (2) times daily as needed. Patient take by mouth as needed     ??? Cholecalciferol, Vitamin D3, (VITAMIN D3) 2,000 unit cap capsule One capsule by mouth daily 90 Cap 3   ??? aspirin delayed-release 81 mg tablet Take 1 Tab by mouth daily. 90 Tab 3    ??? insulin detemir (LEVEMIR FLEXTOUCH) 100 unit/mL (3 mL) inpn 38 Units by SubCUTAneous route nightly.     ??? VESICARE 10 mg tablet TAKE 1 TAB BY MOUTH DAILY. 90 Tab 4   ??? ezetimibe (ZETIA) 10 mg tablet TAKE 1 TAB BY MOUTH NIGHTLY. 90 Tab 3   ??? DULoxetine (CYMBALTA) 60 mg capsule TAKE 1 CAPSULE BY MOUTH DAILY 90 Cap 2   ??? pantoprazole (PROTONIX) 40 mg tablet TAKE 1 TABLET BY MOUTH DAILY 90 Tab 3   ??? bumetanide (BUMEX) 2 mg tablet Take 2 mg by mouth daily.       Allergies   Allergen Reactions   ??? Morphine Anaphylaxis     Tolerates oxycodone without problem   ??? Avalide [Irbesartan-Hydrochlorothiazide] Myalgia   ??? Betadine [Povidone-Iodine] Rash   ??? Demerol [Meperidine] Other (comments)     Makes her feel crazy   ??? Pcn [Penicillins] Swelling   ??? Prinivil [Lisinopril] Cough   ??? Statins-Hmg-Coa Reductase Inhibitors Myalgia   ??? Sulfa (Sulfonamide Antibiotics) Unknown (comments)   ??? Tricor [Fenofibrate Micronized] Myalgia     Past  Medical History:   Diagnosis Date   ??? Acute MI     x3   ??? Arthritis    ??? CAD (coronary artery disease)    ??? Cervical spondylolysis    ??? Chronic pain    ??? COPD    ??? Diabetes ([place])    ??? GERD (gastroesophageal reflux disease)    ??? Goiter 04/20/2012   ??? Hypertension    ??? Neuropathy 04/06/2013     Past Surgical History:   Procedure Laterality Date   ??? 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 CARDIAC SURG PROCEDURE UNLIST      PTCA   ??? HX KNEE ARTHROSCOPY Right     X2   ??? HX OTHER SURGICAL  1970    GANGLION CYST   ??? HX TONSILLECTOMY  1963     Family History   Problem Relation Age of Onset   ??? Lung Disease Mother    ??? Stroke Father    ??? Lung Disease Brother      COPD   ??? Hypertension Brother    ??? Cancer Brother      PROSTATE   ??? Heart Disease Brother    ??? Diabetes Brother    ??? Anesth Problems Neg Hx      Social History   Substance Use Topics   ??? Smoking status: Current Every Day Smoker     Packs/day: 0.30     Years: 45.00     Types: Cigarettes   ??? Smokeless tobacco: Never Used       Comment: passed ready   ??? Alcohol use Yes      Comment: may be 3 times a year if that      The patient does not have a history of falls. A plan of care for falls was documented..  Depression screen neg.      Objective:  Visit Vitals   ??? BP 100/80   ??? Pulse 64   ??? Ht 5' 6" (1.676 m)   ??? Wt 209 lb (94.8 kg)   ??? SpO2 100%   ??? BMI 33.73 kg/m2     wdwn 65 yo wf  In mild distress. A&O.  HEENT -- Pupils round.  O/P Clear.  Neck -- Supple. No JVD.  Heart -- RRR. 2/6 asm.  Lungs -- Coarse breath sounds  Abdomen -- Soft. Non-tender. Non-distended. No masses. Bowel sounds present.  Extremities --. Right groin hematoma, extensive ecchymosis right thigh.  Swelling in right leg- down to foot.          Assessment/Plan:    ICD-10-CM ICD-9-CM    1. Mixed hyperlipidemia K24.0 973.5 METABOLIC PANEL, COMPREHENSIVE   2. 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 Long term (current) use of insulin (HCC) Z79.4 V58.67    4. Hypertension complicating diabetes ([place]) E11.59 250.80 AMB POC URINALYSIS DIP STICK MANUAL W/O MICRO    I10 401.9 PR COLLECTION VENOUS BLOOD,VENIPUNCTURE   5. Coronary artery disease involving native heart with angina pectoris, unspecified vessel or lesion type ([place]) I25.119 414.01      413.9    6. Iron deficiency anemia due to chronic blood loss D50.0 280.0 AMB POC COMPLETE CBC,AUTOMATED ENTER     Discussed with patient will admit for transfusion for symptomatic anemia since hemoglobin is 7  Author:  Genene Churn, MD 10/20/2016 12:55 PM

## 2016-10-20 NOTE — Progress Notes (Signed)
TRANSFER - IN REPORT:    Verbal report received from Kelsey [name] (name) on Kelsey [name]  being received from ED (unit) for routine progression of care      Report consisted of patient???s Situation, Background, Assessment and   Recommendations(SBAR).     Information from the following report(s) SBAR, Kardex, ED Summary and Cardiac Rhythm NSR was reviewed with the receiving nurse.    Opportunity for questions and clarification was provided.      Assessment completed upon patient???s arrival to unit and care assumed.

## 2016-10-20 NOTE — Progress Notes (Signed)
Admission Medication Reconciliation:    Information obtained from: patient and companion    Significant PMH/Disease States:   Past Medical History:   Diagnosis Date   ??? Acute MI     x3   ??? Arthritis    ??? CAD (coronary artery disease)    ??? Cervical spondylolysis    ??? Chronic pain    ??? COPD    ??? Diabetes (HCC)    ??? GERD (gastroesophageal reflux disease)    ??? Goiter 04/20/2012   ??? Hypertension    ??? Neuropathy 04/06/2013       Chief Complaint for this Admission:  anemia    Allergies:  Morphine; Avalide [irbesartan-hydrochlorothiazide]; Betadine [povidone-iodine]; Demerol [meperidine]; Pcn [penicillins]; Prinivil [lisinopril]; Statins-hmg-coa reductase inhibitors; Sulfa (sulfonamide antibiotics); and Tricor [fenofibrate micronized]    Prior to Admission Medications:   Prior to Admission Medications   Prescriptions Last Dose Informant Patient Reported? Taking?   Cholecalciferol, Vitamin D3, (VITAMIN D3) 2,000 unit cap capsule 10/20/2016 at Unknown time  No Yes   Sig: One capsule by mouth daily   DULoxetine (CYMBALTA) 60 mg capsule 10/20/2016 at Unknown time  No Yes   Sig: TAKE 1 CAPSULE BY MOUTH DAILY   VESICARE 10 mg tablet 10/19/2016 at Unknown time  No Yes   Sig: TAKE 1 TAB BY MOUTH DAILY.   acetaminophen (ACETAMINOPHEN EXTRA STRENGTH) 500 mg tablet   Yes No   Sig: Take 1,000 mg by mouth every six (6) hours. With tramadol   aspirin delayed-release 81 mg tablet   No No   Sig: Take 1 Tab by mouth daily.   bumetanide (BUMEX) 2 mg tablet   Yes No   Sig: Take 2 mg by mouth daily.   carvedilol (COREG) 25 mg tablet 10/20/2016 at Unknown time  No Yes   Sig: Take 1 Tab by mouth two (2) times a day.   clopidogrel (PLAVIX) 75 mg tab 10/20/2016 at Unknown time  No Yes   Sig: Take 1 Tab by mouth daily for 360 days.   ezetimibe (ZETIA) 10 mg tablet 10/20/2016 at Unknown time  No Yes   Sig: TAKE 1 TAB BY MOUTH NIGHTLY.   gabapentin (NEURONTIN) 300 mg capsule   Yes No   Sig: Take 300 mg by mouth daily.    gabapentin (NEURONTIN) 300 mg capsule 10/19/2016 at 7 pm  Yes Yes   Sig: Take 1,200 mg by mouth nightly.   insulin detemir (LEVEMIR FLEXTOUCH) 100 unit/mL (3 mL) inpn 10/19/2016 at Unknown time  Yes Yes   Sig: 38 Units by SubCUTAneous route nightly.   losartan (COZAAR) 25 mg tablet 09/20/2016 at Unknown time  No Yes   Sig: Take 1 Tab by mouth daily.   metFORMIN (GLUMETZA ER) 500 mg TG24 24 hour tablet 10/20/2016 at Unknown time  Yes Yes   Sig: Take 1,000 mg by mouth two (2) times a day.   methocarbamol (ROBAXIN) 500 mg tablet   Yes Yes   Sig: Take 1,000 mg by mouth two (2) times daily as needed. Patient take by mouth as needed   nabumetone (RELAFEN) 500 mg tablet 10/20/2016 at Unknown time  Yes Yes   Sig: Take 500 mg by mouth two (2) times a day.   nitroglycerin (NITROSTAT) 0.4 mg SL tablet   No Yes   Sig: 1 Tab by SubLINGual route every five (5) minutes as needed for Chest Pain for up to 3 doses.   pantoprazole (PROTONIX) 40 mg tablet 10/20/2016 at Unknown time  No Yes   Sig: TAKE 1 TABLET BY MOUTH DAILY   pravastatin (PRAVACHOL) 40 mg tablet 10/19/2016 at Unknown time  No Yes   Sig: Take 1 Tab by mouth nightly.   traMADol (ULTRAM) 50 mg tablet   Yes No   Sig: Take 50 mg by mouth every six (6) hours. With tylenol   traMADol (ULTRAM) 50 mg tablet   No No   Sig: Take 1.25-2 Tabs by mouth every six (6) hours as needed for Pain. Max Daily Amount: 400 mg.      Facility-Administered Medications: None     Comments/Recommendations: Medication review done with patient and companion.  Verified Levemir dose  Verified gabapentin 1200mg  evening dose in addition to prn AM dose.  Pt advised by MD to hold Losartan at this time.  Allergies reviewed.

## 2016-10-20 NOTE — ED Triage Notes (Signed)
Referred here by Dr. Cherly HensenPahle for low hgb.  Had heart cath by Dr. Servando SnarePowelson Thursday.  Saturday started with dizziness and SOB. Was seen and evaluated here then.  During follow up appt today, hgb lower than previous results.  Pt on plavix.   +right groin hematoma +right leg swelling.

## 2016-10-20 NOTE — H&P (Signed)
History and Physical    Subjective:     Kelsey [name] is a 65 y.o. Caucasian female who presents with shortness of breath and lightheadedness.  Patient discharged last week after cardiac cath.  Had hematoma right groin.  Right leg then started to swell and she nearly fainted on standing this am.  She had gone to ER over the weekend for the same symptoms hemoglobin then was 8.0/  She denies chest or jaw pain, no fever. Or chills..   Past Medical History:   Diagnosis Date   ??? Acute MI     x3   ??? Arthritis    ??? CAD (coronary artery disease)    ??? Cervical spondylolysis    ??? Chronic pain    ??? COPD    ??? Diabetes ([place])    ??? GERD (gastroesophageal reflux disease)    ??? Goiter 04/20/2012   ??? Hypertension    ??? Neuropathy 04/06/2013     Allergies   Allergen Reactions   ??? Morphine Anaphylaxis     Tolerates oxycodone without problem   ??? Avalide [Irbesartan-Hydrochlorothiazide] Myalgia   ??? Betadine [Povidone-Iodine] Rash   ??? Demerol [Meperidine] Other (comments)     Makes her feel crazy   ??? Pcn [Penicillins] Swelling   ??? Prinivil [Lisinopril] Cough   ??? Statins-Hmg-Coa Reductase Inhibitors Myalgia   ??? Sulfa (Sulfonamide Antibiotics) Unknown (comments)   ??? Tricor [Fenofibrate Micronized] Myalgia     Prior to Admission medications    Medication Sig Start Date End Date Taking? Authorizing Provider   gabapentin (NEURONTIN) 300 mg capsule Take 1,200 mg by mouth nightly.   Yes Historical Provider   metFORMIN (GLUMETZA ER) 500 mg TG24 24 hour tablet Take 1,000 mg by mouth two (2) times a day.   Yes Historical Provider   losartan (COZAAR) 25 mg tablet Take 1 Tab by mouth daily. 10/10/16  Yes Genene Churn, MD   pravastatin (PRAVACHOL) 40 mg tablet Take 1 Tab by mouth nightly. 09/30/16  Yes Genene Churn, MD   carvedilol (COREG) 25 mg tablet Take 1 Tab by mouth two (2) times a day. 09/04/16  Yes Rockwell Alexandria, NP   nitroglycerin (NITROSTAT) 0.4 mg SL tablet 1 Tab by SubLINGual route every  five (5) minutes as needed for Chest Pain for up to 3 doses. 09/04/16  Yes Rockwell Alexandria, NP   nabumetone (RELAFEN) 500 mg tablet Take 500 mg by mouth two (2) times a day.   Yes Historical Provider   clopidogrel (PLAVIX) 75 mg tab Take 1 Tab by mouth daily for 360 days. 08/29/16 08/24/17 Yes Valinda Hoar, MD   methocarbamol (ROBAXIN) 500 mg tablet Take 1,000 mg by mouth two (2) times daily as needed. Patient take by mouth as needed   Yes Historical Provider   Cholecalciferol, Vitamin D3, (VITAMIN D3) 2,000 unit cap capsule One capsule by mouth daily 07/08/16  Yes Rockwell Alexandria, NP   insulin detemir (LEVEMIR FLEXTOUCH) 100 unit/mL (3 mL) inpn 38 Units by SubCUTAneous route nightly.   Yes Historical Provider   VESICARE 10 mg tablet TAKE 1 TAB BY MOUTH DAILY. 06/16/16  Yes Genene Churn, MD   ezetimibe (ZETIA) 10 mg tablet TAKE 1 TAB BY MOUTH NIGHTLY. 04/30/16  Yes Genene Churn, MD   DULoxetine (CYMBALTA) 60 mg capsule TAKE 1 CAPSULE BY MOUTH DAILY 03/31/16  Yes Genene Churn, MD   pantoprazole (PROTONIX) 40 mg tablet TAKE 1 TABLET BY MOUTH DAILY 03/23/16  Yes Genene Churn, MD   traMADol (ULTRAM) 50 mg tablet Take 1.25-2 Tabs by mouth every six (6) hours as needed for Pain. Max Daily Amount: 400 mg. 10/18/16   Horris Latino, DO   gabapentin (NEURONTIN) 300 mg capsule Take 300 mg by mouth daily.    Historical Provider   traMADol (ULTRAM) 50 mg tablet Take 50 mg by mouth every six (6) hours. With tylenol    Historical Provider   acetaminophen (ACETAMINOPHEN EXTRA STRENGTH) 500 mg tablet Take 1,000 mg by mouth every six (6) hours. With tramadol    Historical Provider   aspirin delayed-release 81 mg tablet Take 1 Tab by mouth daily. 07/08/16   Rockwell Alexandria, NP   bumetanide (BUMEX) 2 mg tablet Take 2 mg by mouth daily.    Historical Provider     Social History   Substance Use Topics   ??? Smoking status: Current Every Day Smoker     Packs/day: 0.30     Years: 45.00     Types: Cigarettes    ??? Smokeless tobacco: Never Used      Comment: passed ready   ??? Alcohol use Yes      Comment: may be 3 times a year if that     Family History   Problem Relation Age of Onset   ??? Lung Disease Mother    ??? Stroke Father    ??? Lung Disease Brother      COPD   ??? Hypertension Brother    ??? Cancer Brother      PROSTATE   ??? Heart Disease Brother    ??? Diabetes Brother    ??? Anesth Problems Neg Hx                Review of Systems:  As above.    Objective:       Physical Exam: heavy 65 yo wf  In NAD.  HEENT -- Pupils round.  O/P Clear.  Neck -- Supple. No JVD.  Heart -- RRR 2/6 asm.  Lungs -- Coarse breath sounds  Abdomen -- Soft. Non-tender. Non-distended. No masses. Bowel sounds present.  Extremities -- +2 ecchymosis/ edema.rt thigh.  Rt groin Hematoma.          Data Review:   Recent Results (from the past 24 hour(s))   AMB POC URINALYSIS DIP STICK MANUAL W/O MICRO    Collection Time: 10/20/16 12:47 PM   Result Value Ref Range    Color (UA POC) Yellow     Clarity (UA POC) Clear     Glucose (UA POC) Negative Negative    Bilirubin (UA POC) Negative Negative    Ketones (UA POC) Negative Negative    Specific gravity (UA POC) 1.010 1.001 - 1.035    Blood (UA POC) Negative Negative    pH (UA POC) 5.5 4.6 - 8.0    Protein (UA POC) Negative Negative mg/dL    Urobilinogen (UA POC) 0.2 mg/dL 0.2 - 1    Nitrites (UA POC) Negative Negative    Leukocyte esterase (UA POC) Negative Negative   AMB POC COMPLETE CBC,AUTOMATED ENTER    Collection Time: 10/20/16 12:47 PM   Result Value Ref Range    WBC (POC) 10.3 4.5 - 10.5 K/uL    LYMPHOCYTES (POC) 23.4 20.5 - 51.1 %    MONOCYTES (POC) 4.5 1.7 - 9.3 %    GRANULOCYTES (POC) 72.1 42.2 - 75.2 %    ABS. LYMPHS (POC) 2.4 1.2 - 3.4 K/uL  ABS. MONOS (POC) 0.5 0.1 - 0.6 10^3/ul    ABS. GRANS (POC) 7.4 (A) 1.4 - 6.5 10^3/ul    RBC (POC) 2.61 (A) 4.00 - 6.00 M/uL    HGB (POC) 6.9 (A) 11 - 18 g/dL    HCT (POC) 21.5 (A) 35.0 - 60.0 %    MCV (POC) 82.2 80.0 - 99.9 fL    MCH (POC) 26.5 (A) 27.0 - 31.0 pg     MCHC (POC) 32.2 (A) 33.0 - 37.0 g/dL    RDW (POC) 16.2 (A) 11.6 - 13.7 %    PLATELET (POC) 230 150 - 450 K/uL    MPV (POC) 6.4 (A) 7.8 - 11 fL   CBC W/O DIFF    Collection Time: 10/20/16  2:51 PM   Result Value Ref Range    WBC 9.7 3.6 - 11.0 K/uL    RBC 2.67 (L) 3.80 - 5.20 M/uL    HGB 7.0 (L) 11.5 - 16.0 g/dL    HCT 22.4 (L) 35.0 - 47.0 %    MCV 83.9 80.0 - 99.0 FL    MCH 26.2 26.0 - 34.0 PG    MCHC 31.3 30.0 - 36.5 g/dL    RDW 15.7 (H) 11.5 - 14.5 %    PLATELET 246 150 - 956 K/uL   METABOLIC PANEL, COMPREHENSIVE    Collection Time: 10/20/16  2:51 PM   Result Value Ref Range    Sodium 132 (L) 136 - 145 mmol/L    Potassium 4.6 3.5 - 5.1 mmol/L    Chloride 99 97 - 108 mmol/L    CO2 23 21 - 32 mmol/L    Anion gap 10 5 - 15 mmol/L    Glucose 141 (H) 65 - 100 mg/dL    BUN 17 6 - 20 MG/DL    Creatinine 1.09 (H) 0.55 - 1.02 MG/DL    BUN/Creatinine ratio 16 12 - 20      GFR est AA >60 >60 ml/min/1.31m    GFR est non-AA 50 (L) >60 ml/min/1.733m   Calcium 8.6 8.5 - 10.1 MG/DL    Bilirubin, total 0.3 0.2 - 1.0 MG/DL    ALT (SGPT) 16 12 - 78 U/L    AST (SGOT) 12 (L) 15 - 37 U/L    Alk. phosphatase 89 45 - 117 U/L    Protein, total 6.3 (L) 6.4 - 8.2 g/dL    Albumin 3.2 (L) 3.5 - 5.0 g/dL    Globulin 3.1 2.0 - 4.0 g/dL    A-G Ratio 1.0 (L) 1.1 - 2.2     TYPE + CROSSMATCH    Collection Time: 10/20/16  6:31 PM   Result Value Ref Range    Crossmatch Expiration 10/23/2016     ABO/Rh(D) A POSITIVE     Antibody screen NEG     Unit number [id number]   Blood component type RC LR AS1     Unit division 00     Status of unit ALLOCATED     Crossmatch result Compatible     Unit number [id number]   Blood component type RC LR AS1     Unit division 00     Status of unit ALLOCATED     Crossmatch result Compatible              Assessment:     Active Problems:    Type II diabetes mellitus, uncontrolled ([place](12/24/2009)      Dyspnea (03/13/2011)  Neuropathy (04/06/2013)      Long term (current) use of insulin ([place]) (03/04/2016)       Hypertension complicating diabetes ([place]) (03/04/2016)      CAD (coronary artery disease) (10/16/2016)      Anemia (10/20/2016)        Plan:     Transfuse prbc's  Decrease lantus, use ssi for diabetes  Continue neurontin for neuropathy  Hold losartan until bp improves    Signed By: Genene Churn, MD     October 20, 2016

## 2016-10-20 NOTE — Other (Signed)
TRANSFER - OUT REPORT:    Verbal report given to Britanny, RN(name) on Ladell HeadsDiana H Labrum  being transferred to 519(unit) for routine progression of care       Report consisted of patient???s Situation, Background, Assessment and   Recommendations(SBAR).     Information from the following report(s) SBAR was reviewed with the receiving nurse.    Lines:   Peripheral IV 10/20/16 Left Antecubital (Active)   Site Assessment Clean, dry, & intact 10/20/2016  2:55 PM   Phlebitis Assessment 0 10/20/2016  2:55 PM   Infiltration Assessment 0 10/20/2016  2:55 PM   Dressing Status Clean, dry, & intact 10/20/2016  2:55 PM   Dressing Type Transparent 10/20/2016  2:55 PM   Hub Color/Line Status Pink;Flushed;Patent 10/20/2016  2:55 PM   Action Taken Blood drawn 10/20/2016  2:55 PM        Opportunity for questions and clarification was provided.      Patient transported with:   Monitor

## 2016-10-20 NOTE — Telephone Encounter (Signed)
Please call [name],  she is concerned, would like answers so she can understand what is going on with Kelsey MossesDiana, [phone]  Cell phone on her hip, [facility]

## 2016-10-21 LAB — METABOLIC PANEL, COMPREHENSIVE
A-G Ratio: 1.9 (ref 1.2–2.2)
ALT (SGPT): 12 IU/L (ref 0–32)
AST (SGOT): 13 IU/L (ref 0–40)
Albumin: 3.7 g/dL (ref 3.6–4.8)
Alk. phosphatase: 79 IU/L (ref 39–117)
BUN/Creatinine ratio: 20 (ref 12–28)
BUN: 18 mg/dL (ref 8–27)
Bilirubin, total: <0.2 mg/dL (ref 0.0–1.2)
CO2: 22 mmol/L (ref 18–29)
Calcium: 9.1 mg/dL (ref 8.7–10.3)
Chloride: 97 mmol/L (ref 96–106)
Creatinine: 0.91 mg/dL (ref 0.57–1.00)
GFR est AA: 77 mL/min/{1.73_m2} (ref >59)
GFR est non-AA: 66 mL/min/{1.73_m2} (ref >59)
GLOBULIN, TOTAL: 1.9 g/dL (ref 1.5–4.5)
Glucose: 129 mg/dL — ABNORMAL HIGH (ref 65–99)
Potassium: 5.5 mmol/L — ABNORMAL HIGH (ref 3.5–5.2)
Protein, total: 5.6 g/dL — ABNORMAL LOW (ref 6.0–8.5)
Sodium: 137 mmol/L (ref 134–144)

## 2016-10-21 LAB — CBC WITH AUTOMATED DIFF
ABS. BASOPHILS: 0 10*3/uL (ref 0.0–0.1)
ABS. EOSINOPHILS: 0.2 10*3/uL (ref 0.0–0.4)
ABS. LYMPHOCYTES: 1.8 10*3/uL (ref 0.8–3.5)
ABS. MONOCYTES: 0.7 10*3/uL (ref 0.0–1.0)
ABS. NEUTROPHILS: 4 10*3/uL (ref 1.8–8.0)
BASOPHILS: 0 % (ref 0–1)
EOSINOPHILS: 3 % (ref 0–7)
HCT: 26.4 % — ABNORMAL LOW (ref 35.0–47.0)
HGB: 8.6 g/dL — ABNORMAL LOW (ref 11.5–16.0)
LYMPHOCYTES: 27 % (ref 12–49)
MCH: 27 PG (ref 26.0–34.0)
MCHC: 32.6 g/dL (ref 30.0–36.5)
MCV: 82.8 FL (ref 80.0–99.0)
MONOCYTES: 10 % (ref 5–13)
NEUTROPHILS: 60 % (ref 32–75)
PLATELET: 185 10*3/uL (ref 150–400)
RBC: 3.19 M/uL — ABNORMAL LOW (ref 3.80–5.20)
RDW: 15.1 % — ABNORMAL HIGH (ref 11.5–14.5)
WBC: 6.6 10*3/uL (ref 3.6–11.0)

## 2016-10-21 LAB — GLUCOSE, POC
Glucose (POC): 176 mg/dL — ABNORMAL HIGH (ref 65–100)
Glucose (POC): 154 mg/dL — ABNORMAL HIGH (ref 65–100)
Glucose (POC): 156 mg/dL — ABNORMAL HIGH (ref 65–100)

## 2016-10-21 LAB — HGB & HCT
HCT: 28.3 % — ABNORMAL LOW (ref 35.0–47.0)
HGB: 9.2 g/dL — ABNORMAL LOW (ref 11.5–16.0)

## 2016-10-21 MED ORDER — GABAPENTIN 300 MG CAP
300 mg | Freq: Three times a day (TID) | ORAL | Status: DC
Start: 2016-10-21 — End: 2016-10-21
  Administered 2016-10-21 (×3): via ORAL

## 2016-10-21 MED ORDER — CLOPIDOGREL 75 MG TAB
75 mg | Freq: Every day | ORAL | Status: DC
Start: 2016-10-21 — End: 2016-10-21
  Administered 2016-10-21: 13:00:00 via ORAL

## 2016-10-21 MED ORDER — LOSARTAN 25 MG TAB
25 mg | Freq: Every day | ORAL | Status: DC
Start: 2016-10-21 — End: 2016-10-21
  Administered 2016-10-21: 13:00:00 via ORAL

## 2016-10-21 MED ORDER — SODIUM CHLORIDE 0.9 % IJ SYRG
Freq: Three times a day (TID) | INTRAMUSCULAR | Status: DC
Start: 2016-10-21 — End: 2016-10-21
  Administered 2016-10-21 (×3): via INTRAVENOUS

## 2016-10-21 MED ORDER — INSULIN LISPRO 100 UNIT/ML INJECTION
100 unit/mL | Freq: Three times a day (TID) | SUBCUTANEOUS | Status: DC
Start: 2016-10-21 — End: 2016-10-21
  Administered 2016-10-21: 16:00:00 via SUBCUTANEOUS

## 2016-10-21 MED ORDER — DEXTROSE 50% IN WATER (D50W) IV SYRG
INTRAVENOUS | Status: DC | PRN
Start: 2016-10-21 — End: 2016-10-21

## 2016-10-21 MED ORDER — ASPIRIN 81 MG TAB, DELAYED RELEASE
81 mg | Freq: Every day | ORAL | Status: DC
Start: 2016-10-21 — End: 2016-10-21
  Administered 2016-10-21: 13:00:00 via ORAL

## 2016-10-21 MED ORDER — INSULIN GLARGINE 100 UNIT/ML INJECTION
100 unit/mL | Freq: Every evening | SUBCUTANEOUS | Status: DC
Start: 2016-10-21 — End: 2016-10-21
  Administered 2016-10-21: 03:00:00 via SUBCUTANEOUS

## 2016-10-21 MED ORDER — ACETAMINOPHEN 325 MG TABLET
325 mg | ORAL | Status: DC | PRN
Start: 2016-10-21 — End: 2016-10-21
  Administered 2016-10-21: via ORAL

## 2016-10-21 MED ORDER — GABAPENTIN 400 MG CAP
400 mg | Freq: Every evening | ORAL | Status: DC
Start: 2016-10-21 — End: 2016-10-21
  Administered 2016-10-21: 03:00:00 via ORAL

## 2016-10-21 MED ORDER — CARVEDILOL 12.5 MG TAB
12.5 mg | Freq: Two times a day (BID) | ORAL | Status: DC
Start: 2016-10-21 — End: 2016-10-21
  Administered 2016-10-21 (×3): via ORAL

## 2016-10-21 MED ORDER — FAMOTIDINE 20 MG TAB
20 mg | Freq: Two times a day (BID) | ORAL | Status: DC
Start: 2016-10-21 — End: 2016-10-21
  Administered 2016-10-21 (×3): via ORAL

## 2016-10-21 MED ORDER — GLUCAGON 1 MG INJECTION
1 mg | INTRAMUSCULAR | Status: DC | PRN
Start: 2016-10-21 — End: 2016-10-21

## 2016-10-21 MED ORDER — DULOXETINE 60 MG CAP, DELAYED RELEASE
60 mg | Freq: Every day | ORAL | Status: DC
Start: 2016-10-21 — End: 2016-10-21
  Administered 2016-10-21: 13:00:00 via ORAL

## 2016-10-21 MED ORDER — EZETIMIBE 10 MG TAB
10 mg | Freq: Every day | ORAL | Status: DC
Start: 2016-10-21 — End: 2016-10-21
  Administered 2016-10-21: 13:00:00 via ORAL

## 2016-10-21 MED ORDER — PRAVASTATIN 20 MG TAB
20 mg | Freq: Every evening | ORAL | Status: DC
Start: 2016-10-21 — End: 2016-10-21
  Administered 2016-10-21: 03:00:00 via ORAL

## 2016-10-21 MED ORDER — GLUCOSE 4 GRAM CHEWABLE TAB
4 gram | ORAL | Status: DC | PRN
Start: 2016-10-21 — End: 2016-10-21

## 2016-10-21 MED ORDER — SODIUM CHLORIDE 0.9 % IJ SYRG
INTRAMUSCULAR | Status: DC | PRN
Start: 2016-10-21 — End: 2016-10-21

## 2016-10-21 MED ORDER — SODIUM CHLORIDE 0.9 % IV
INTRAVENOUS | Status: DC | PRN
Start: 2016-10-21 — End: 2016-10-21

## 2016-10-21 MED FILL — LANTUS U-100 INSULIN 100 UNIT/ML SUBCUTANEOUS SOLUTION: 100 unit/mL | SUBCUTANEOUS | Qty: 0.24

## 2016-10-21 MED FILL — CLOPIDOGREL 75 MG TAB: 75 mg | ORAL | Qty: 1

## 2016-10-21 MED FILL — GABAPENTIN 400 MG CAP: 400 mg | ORAL | Qty: 3

## 2016-10-21 MED FILL — CARVEDILOL 12.5 MG TAB: 12.5 mg | ORAL | Qty: 2

## 2016-10-21 MED FILL — EZETIMIBE 10 MG TAB: 10 mg | ORAL | Qty: 1

## 2016-10-21 MED FILL — PRAVASTATIN 20 MG TAB: 20 mg | ORAL | Qty: 2

## 2016-10-21 MED FILL — GABAPENTIN 300 MG CAP: 300 mg | ORAL | Qty: 1

## 2016-10-21 MED FILL — INSULIN LISPRO 100 UNIT/ML INJECTION: 100 unit/mL | SUBCUTANEOUS | Qty: 1

## 2016-10-21 MED FILL — NORMAL SALINE FLUSH 0.9 % INJECTION SYRINGE: INTRAMUSCULAR | Qty: 10

## 2016-10-21 MED FILL — FAMOTIDINE 20 MG TAB: 20 mg | ORAL | Qty: 1

## 2016-10-21 MED FILL — LOSARTAN 25 MG TAB: 25 mg | ORAL | Qty: 1

## 2016-10-21 MED FILL — DULOXETINE 60 MG CAP, DELAYED RELEASE: 60 mg | ORAL | Qty: 1

## 2016-10-21 MED FILL — SODIUM CHLORIDE 0.9 % IV: INTRAVENOUS | Qty: 250

## 2016-10-21 MED FILL — TYLENOL 325 MG TABLET: 325 mg | ORAL | Qty: 2

## 2016-10-21 MED FILL — ASPIRIN 81 MG TAB, DELAYED RELEASE: 81 mg | ORAL | Qty: 1

## 2016-10-21 NOTE — Discharge Summary (Signed)
Physician Discharge Summary     Patient ID:  Ladell HeadsDiana H Brunkhorst  [id number]  65 y.o.  16-Apr-1951    Admit date: 10/20/2016    Discharge date and time: 10/21/2016    Briefly, pt was admitted with Anemia;Anemia.  For details of admission, see H&P.    Hospital Course:  Patient admitted and transfused 2 units packed red blood cells.  Hemoglobin improved to 8.6 and remained stable 6 hours later at 9.2  Patient felt less sob.  Losartan was still held on discharge due to hypotension     Discharge Dx: acute blood loss anemia    Condition at discharge: improved    Disposition: home    Patient Instructions:   Cannot display discharge medications since this patient is not currently admitted.      Follow-up with Dr.Manley Fason in 2 days.      Signed:  Lake BellsNancy J Deborahann Poteat, MD  10/31/2016  8:30 AM

## 2016-10-21 NOTE — Procedures (Signed)
[facility]  *** FINAL REPORT ***    Name: Kelsey [name], [place]  MRN: [id number]  DOB: 09 Dec 1951  HIS Order #: [id number]  TRAKnet Visit #: [id number]  Date: 20 Oct 2016    TYPE OF TEST: Peripheral Venous Testing    REASON FOR TEST  Left leg swelling post heart cath last week    Right Leg:-  Deep venous thrombosis:           No  Superficial venous thrombosis:    No  Deep venous insufficiency:        Not examined  Superficial venous insufficiency: Not examined      INTERPRETATION/FINDINGS  PROCEDURE:  Color duplex ultrasound imaging of lower extremity veins.    FINDINGS:       Right: The common femoral, deep femoral, femoral, popliteal,  posterior tibial, peroneal, and great saphenous are patent and without   evidence of thrombus; each is is fully compressible and there is no  narrowing of the flow channel on color Doppler imaging.  Phasic flow  is observed in the common femoral vein.       Left:   The common femoral vein is patent and without evidence of   thrombus.  Phasic flow is observed.  This extremity was not otherwise   evaluated.    IMPRESSION:  No evidence of right lower extremity vein thrombosis.  Ther is an area in the right groin which appears to be hematom.    ADDITIONAL COMMENTS    I have personally reviewed the data relevant to the interpretation of  this  study.    TECHNOLOGIST: Jodi MarbleGirma Herego, RVT  Signed: 10/20/2016 04:12 PM    PHYSICIAN: Roylene ReasonFrank Stoneburner, Jr., MD  Signed: 10/21/2016 08:57 AM

## 2016-10-21 NOTE — Progress Notes (Signed)
I have reviewed discharge instructions with the patient and spouse.  The patient and spouse verbalized understanding.

## 2016-10-21 NOTE — Discharge Summary (Signed)
Physician Discharge Summary     Patient ID:  Kelsey [name]  [id number]  64 y.o.  02/21/1951    Admit date: 10/20/2016    Discharge date and time: 10/21/2016    Briefly, pt was admitted with Anemia;Anemia.  For details of admission, see H&P.    Hospital Course:  Patient admitted and transfused 2 units packed red blood cells.  Hemoglobin improved to 8.6 and remained stable 6 hours later at 9.2  Patient felt less sob.  Losartan was still held on discharge due to hypotension     Discharge Dx: acute blood loss anemia    Condition at discharge: improved    Disposition: home    Patient Instructions:   Cannot display discharge medications since this patient is not currently admitted.      Follow-up with Dr.Strummer Canipe in 2 days.      Signed:  Ritesh Opara J Yudith Norlander, MD  10/31/2016  8:30 AM

## 2016-10-21 NOTE — Progress Notes (Signed)
Reviewed medical chart; met with the patient at the bedside.  Patient is being seen for shortness of breath and lightheadedness.  Patient lives with her wife, Jacqlyn Larsen [phone].  She has recently started using a cane to ambulate.  Her PCP is Dr. Jewel Baize and she gets her prescriptions at [facility] on [address].  Patient is not employed at this time.  Care Management will continue to follow her disposition.   Ellis Savage, MSW    Care Management Interventions  PCP Verified by CM: Yes  Palliative Care Criteria Met (RRAT>21 & CHF Dx)?: No  Mode of Transport at Discharge:  (Patient will travel home via car.  )  MyChart Signup: No  Discharge Durable Medical Equipment: No  Physical Therapy Consult: No  Occupational Therapy Consult: No  Speech Therapy Consult: No  Current Support Network: Lives with Spouse  Confirm Follow Up Transport:  (Patient will travel home via car.  )  Plan discussed with Pt/Family/Caregiver: Yes  Freedom of Choice Offered: Yes  Discharge Location  Discharge Placement: Home with family assistance

## 2016-10-21 NOTE — Other (Signed)
Letter of Status Determination:   Recommend hospitalization status upgraded from   OBSERVATION  to INPATIENT  Status     Pt Name:  Kelsey [name]   MR#   HAR # [id number] /  [id number]   CSN#  [id number]   [facility]  519[facility]   Hospitalization date  10/20/2016  4:57 PM   Current Attending Physician  Lake BellsNancy J Pahle, MD   Principal diagnosis  <principal problem not specified>   Anemia    Clinicals  65 y.o. y.o  female hospitalized with above diagnosis   The pt developed near syncope at home. She has a hematoma in the right groin at the cath site from her recent cardiac cath.   She was found to have significant symptomatic anemia and now ONE unit PRBC ordered.      [place] (MCG) criteria   Does  NOT apply    STATUS DETERMINATION  This patient is at high risk of adverse events and deterioration based on documented clinical data, comorbid conditions and current acute care course.       Ms. Kelsey [name] is expected to meet Inpatient Admission status criteria in accordance with CMS regulation Section 43 CFR 412.3. Specifically, due to medical necessity the patient's stay is expected to exceed Two Midnights.     It is our recommendation that this patient's hospitalization status should be upgraded from  OBSERVATION to INPATIENT status.     The final decision of the patient's hospitalization status depends on the attending physician's judgment.         Additional comments     Payor: VA MEDICARE / Plan: VA MEDICARE PART A & B / Product Type: Medicare /         Gabriel CarinaHasan M. Kylei Purington MD MPH FACP     Physician Advisor    [facility]   Care Management & Compliant Documentation Management Program  [facility]  [facility]    [facility] [facility]   President Medical Staff, [facility]    Cell  [phone]        [phone]    .

## 2016-10-21 NOTE — Procedures (Signed)
[facility]  *** FINAL REPORT ***    Name: Kelsey [name], Kelsey [name]  MRN: [id number]  DOB: 09 Dec 1951  HIS Order #: [id number]  TRAKnet Visit #: [id number]  Date: 20 Oct 2016    TYPE OF TEST: Peripheral Venous Testing    REASON FOR TEST  Left leg swelling post heart cath last week    Right Leg:-  Deep venous thrombosis:           No  Superficial venous thrombosis:    No  Deep venous insufficiency:        Not examined  Superficial venous insufficiency: Not examined      INTERPRETATION/FINDINGS  PROCEDURE:  Color duplex ultrasound imaging of lower extremity veins.    FINDINGS:       Right: The common femoral, deep femoral, femoral, popliteal,  posterior tibial, peroneal, and great saphenous are patent and without   evidence of thrombus; each is is fully compressible and there is no  narrowing of the flow channel on color Doppler imaging.  Phasic flow  is observed in the common femoral vein.       Left:   The common femoral vein is patent and without evidence of   thrombus.  Phasic flow is observed.  This extremity was not otherwise   evaluated.    IMPRESSION:  No evidence of right lower extremity vein thrombosis.  Ther is an area in the right groin which appears to be hematom.    ADDITIONAL COMMENTS    I have personally reviewed the data relevant to the interpretation of  this  study.    TECHNOLOGIST: Girma Herego, RVT  Signed: 10/20/2016 04:12 PM    PHYSICIAN: Tieisha Darden Stoneburner, Jr., MD  Signed: 10/21/2016 08:57 AM

## 2016-10-21 NOTE — Progress Notes (Signed)
Patient feeling better, less shortness of breath  .  Will rechk hemoglobin and discharge if stable

## 2016-10-22 ENCOUNTER — Encounter: Attending: Internal Medicine | Primary: Internal Medicine

## 2016-10-22 LAB — TYPE + CROSSMATCH
ABO/Rh(D): A POS
Antibody screen: NEGATIVE
Status of unit: TRANSFUSED
Status of unit: TRANSFUSED
Unit division: 0
Unit division: 0

## 2016-10-23 ENCOUNTER — Ambulatory Visit: Admit: 2016-10-23 | Discharge: 2016-10-23 | Payer: MEDICARE | Attending: Internal Medicine | Primary: Internal Medicine

## 2016-10-23 DIAGNOSIS — I25119 Atherosclerotic heart disease of native coronary artery with unspecified angina pectoris: Secondary | ICD-10-CM

## 2016-10-23 DIAGNOSIS — D500 Iron deficiency anemia secondary to blood loss (chronic): Secondary | ICD-10-CM

## 2016-10-23 LAB — AMB POC COMPLETE CBC,AUTOMATED ENTER
ABS. GRANS (POC): 5.6 10*3/uL (ref 1.4–6.5)
ABS. LYMPHS (POC): 2.4 10*3/uL (ref 1.2–3.4)
ABS. MONOS (POC): 1 10*3/uL — AB (ref 0.1–0.6)
GRANULOCYTES (POC): 61.7 % (ref 42.2–75.2)
HCT (POC): 26.4 % — AB (ref 35.0–60.0)
HGB (POC): 8.8 g/dL — AB (ref 11–18)
LYMPHOCYTES (POC): 27.1 % (ref 20.5–51.1)
MCH (POC): 28.1 pg (ref 27.0–31.0)
MCHC (POC): 33.5 g/dL (ref 33.0–37.0)
MCV (POC): 83.9 fL (ref 80.0–99.9)
MONOCYTES (POC): 11.2 % — AB (ref 1.7–9.3)
MPV (POC): 7.3 fL — AB (ref 7.8–11)
PLATELET (POC): 236 10*3/uL (ref 150–450)
RBC (POC): 3.14 M/uL — AB (ref 4.00–6.00)
RDW (POC): 15.8 % — AB (ref 11.6–13.7)
WBC (POC): 9 10*3/uL (ref 4.5–10.5)

## 2016-10-23 MED ORDER — FERROUS SULFATE 325 MG (65 MG ELEMENTAL IRON) TAB, DELAYED RELEASE
325 mg (65 mg iron) | ORAL_TABLET | Freq: Every day | ORAL | 3 refills | Status: DC
Start: 2016-10-23 — End: 2018-02-02

## 2016-10-23 NOTE — Patient Instructions (Addendum)
Please have blood work drawn to check your hemoglobin level   Please exercise your right leg as much as possible, you can do toe points to flex and relax your calf if you can't walk  You can alternate hot and cold compresses  Please call if you develop worsening dizziness, abdominal pain, lower back pain  Please start taking Ferrous Sulfate 325mg  daily to help raise your iron with your anemia  You can eat some red meat for now to help raise your iron levels

## 2016-10-23 NOTE — Progress Notes (Signed)
[facility]  [phone]    HPI: Kelsey [name], a 65 y.o. year-old who presents for follow up regarding her CAD.    [facility] when she presented to ER with elevated BP, jaw/throat/facial tightness and dyspnea  She had just been started losartan on 10/14/16 for elevated BP  Her troponin bumped to 0.71 and she underwent another cardiac cath which did not show any new CAD, no PCI performed  She was discharged but then came back to ER with right groin hematoma and acute anemia with Hgb 6.9, hypotension and dyspnea  She received 2 units of PRBCs, groin US was negative, right leg venous duplex was negative and she was discharged on 10/21/16 and her Hgb was 9.2 at the time  Since that time she has not had any fevers but has felt cold with her anemia   No abdominal pain or back pain   She denies chest pain   Eating more ice, has always done this  Right leg is ecchymotic, painful and swollen  She still feels dizzy but no syncope  She still has some dyspnea and feels tired  No chest pain or palpitations  She has been checking her blood pressure at home after starting losartan and it has been good  Her angina equivalent was jaw pain in the past  Has balance issues, on gabapentin for neuropathy, walks with a cane, needs hip surgery  Still smoking - on Chantix right now, when she comes off it she smokes more   Has had short term memory issues since her CABG/AVR  Difficulty with word finding, cognitive abilities are more limited  Has a hard time remembering people's names  Advised to follow back up with neurology previously     Good friend of Kelsey [name] who passed away, former patient here  Her wife is here today with her    Assessment/Plan:  1.  CAD - hx of MI and PCI x 3, had 3 vessel CABG 06/08/2012 by Dr. Bary RichardBundy, s/p PCI/DES to diagonal 08/29/16, no progression of CAD on cardiac cath 10/17  -continue ASA, Plavix, Coreg and statin   -has Rx for NTG SL tabs PRN chest pain  -follow up in 1 month   2.  Severe AS - s/p bioprosthetic bovine AVR 06/08/2012, stable by TTE 8/17   3.  Carotid stenosis - right 50-69% stenosis, left 10-49% stenosis on duplex 8/17, on ASA and statin  4.  PAD s/p stent in distal right SFA, stent in left common iliac artery - last ABI 12/16 showed right 0.82 and left 0.88, on ASA and statin, encouraged her to quit smoking  5.  Dyslipidemia - LDL 75, TG 282 in 6/17, on zetia and pravastatin 40mg  daily, had myalgias on fenofibrate, will check NMR at next visit   6.  DM Type 2 - Hgb A1c 6.3%, on insulin, followed by Dr. Cherly HensenPahle  7.  HTN - well controlled on losartan and Coreg, asked her to check BP at home   8.  Chronic pain/arthritis - needs hip surgery with Dr. Jola BaptistKump/orthopedics  9.  Vitamin D deficiency - now on Vitamin D 2000 units daily, discussed maintaining a normal Vitamin D level to help prevent myalgias with statins at previous visit  10.  RBBB - chronic  11.  Hyponatremia - chronic, stable  12.  Anemia - Hgb down to 6.9 after right groin hematoma post cath, will check CBC today, Hgb up to  9.2 at discharge, encouraged exercise and alternating hot/cold compresses, will begin ferrous sulfate 325mg  daily     Cardiac Cath 10/17 -LCA:  LAD: 30% proximal after large D1, with competitive inflow from LIMA to mid LAD, D1 recently stented widely patent, LCX: 80% after small MOM, before large PLOM which has competitive SVG inflow, RCA: occluded mid, LIMA --> LAD widely patent unobstructed anasomosis, SVG --> PLOM widely patent, SVG --> LVBr of distal RCA widely patent, moderate disease proximal to SVG insertion, yielding ischemic potential in small PDA (similar to cath 6 weeks ago, not fixable and VERY unlikely to be problematic).  IMP: CAD post PCI, CABG with very limited ischemic potential  Cardiac Cath 9/17 - Patent LIMA to LAD, SVG's to RCA and LCX.  Very large  diagonal has eccentric 90% stenosis, treated with 2.75 x 12 Xience DES good result.  Bioprosthetic AV not crossed given recent echo.  Echo 8/17 - LVEF 55-60%, no WMA, wall thickness was increased, dilated LA, atrial septum thickened, mildly dilated RA, mild MR  Carotid Duplex 8/17 -  50-79% R ICA stenosis, 10-49% stenosis of the L ICA, >50% stenosis of the left ECA  ABI 12/16 - right 0.82 and left 0.88  Echo 6/16 - LVEF 55%, no WMA, bovine AVR with normal function, no AI, mild AS, no pericardial effusion, grd 1 dd, MAC with calcified chords noted with mild sclerosis, mild MR, trace TR  Carotid duplex 6/16 - right 50-69% prox ICA stenosis with moderate hemodynamic compromise, left 1-49% prox ICA stenosis with mild hemodynamic compromise, no change compared to 2014  ABI 12/15 - right 0.70 and left 0.88  Peripheral angiogram 5/15 - stent in distal right SFA, stent in left common iliac artery  ABI 5/15 - right 0.68 and left 0.88  Abd CTA 5/15 - significant PVD, 80% proximal stenosis in celiac plexus, abdominal aorta with diffuse calcification and mural thrombus, mild disease in right and left renal arteries, moderate right iliac disease with CFA stenosis, SFA with moderate disease, left common iliac with ulcerated plaque proximally, moderate to severe disease in iliac system, SFA with moderate to severe diffuse disease  Echo 09/2012 - LVEF 45-50%, AVR ok, mild MR, mild TR  06/08/12 -CABG x 3 (LIMA to LAD, SVG to OM1, SVG to PLB) and 21 mm [facility] Ease pericardial prosthesis at [facility]  Cardiac Cath 06/04/2012 - mod AS (30mm gradient), mod AI, diffuse 3 vessel CAD  Cardiac Cath 05/2011 - LVEF 30%, occluded mid RCA, high grade stenosis distal LCx, mild AS s/p PCI/DES to LCx by Dr. Oscar La  Cardiac Cath 02/2009 - patent stents in RCA and LCx  PCI to LCx 2001  MI/PCI to RCA 2000    Soc Hx: smokes 1/4 ppd, drinks 2 glasses of wine/month, no drug use  Fam Hx: father had CVA at age 82, had a couple of MIs in his 72s and had  arteries replaced with teflon, mother passed at age 37 from aortic aneurysm, brother passed age 24 from sepsis and had bad PVD with multiple bypass surgeries    She  has a past medical history of Acute MI; Arthritis; CAD (coronary artery disease); Cervical spondylolysis; Chronic pain; COPD; Diabetes (HCC); GERD (gastroesophageal reflux disease); Goiter (04/20/2012); Hypertension; and Neuropathy (04/06/2013).    Cardiovascular ROS: no chest pain, positive for dyspnea and dizziness and fatigue   Respiratory ROS: no cough or wheezing  Neurological ROS: no TIA or stroke symptoms  All other systems negative except as above.  PE  Vitals:    10/23/16 0825   BP: 110/60   Pulse: 64   Resp: 16   Weight: 210 lb (95.3 kg)   Height: 5\' 6"  (1.676 m)    Body mass index is 33.89 kg/(m^2).   General appearance - alert, well appearing, and in no distress  Mental status - affect appropriate to mood  Eyes - sclera anicteric, moist mucous membranes  Neck - supple, bilateral carotid bruits   Lymphatics - not assessed  Chest - clear to auscultation, no wheezes, rales or rhonchi  Heart - normal rate, regular rhythm, normal S1, S2, 3/6 SEM    Abdomen - soft, nontender, nondistended, no masses or organomegaly  Back exam - full range of motion, no tenderness  Neurological - cranial nerves II through XII grossly intact, no focal deficit  Musculoskeletal - no muscular tenderness noted, normal strength  Extremities - peripheral pulses diminished, right groin with ecchymosis extending to knee on anterior and posterior thigh, no bruit, 1+ LE edema in right leg   Skin - normal coloration  no rashes    12 lead ECG: NSR with RBBB, non-specific ST-T wave changes     Recent Labs:  Lab Results   Component Value Date/Time    Cholesterol, total 90 10/16/2016 03:36 AM    HDL Cholesterol 46 10/16/2016 03:36 AM    LDL, calculated 18.2 10/16/2016 03:36 AM    Triglyceride 129 10/16/2016 03:36 AM    CHOL/HDL Ratio 2.0 10/16/2016 03:36 AM     Lab Results    Component Value Date/Time    Creatinine 1.09 10/20/2016 02:51 PM     Lab Results   Component Value Date/Time    BUN 17 10/20/2016 02:51 PM     Lab Results   Component Value Date/Time    Potassium 4.6 10/20/2016 02:51 PM     No results found for: HBA1C, HGBE8, HBA1CEXT, HBA1CEXT  Lab Results   Component Value Date/Time    HGB 9.2 10/21/2016 02:07 PM     Lab Results   Component Value Date/Time    PLATELET 185 10/21/2016 06:40 AM       Reviewed:  Past Medical History:   Diagnosis Date   ??? Acute MI     x3   ??? Arthritis    ??? CAD (coronary artery disease)    ??? Cervical spondylolysis    ??? Chronic pain    ??? COPD    ??? Diabetes (HCC)    ??? GERD (gastroesophageal reflux disease)    ??? Goiter 04/20/2012   ??? Hypertension    ??? Neuropathy 04/06/2013     History   Smoking Status   ??? Current Every Day Smoker   ??? Packs/day: 0.30   ??? Years: 45.00   ??? Types: Cigarettes   Smokeless Tobacco   ??? Never Used     Comment: passed ready     History   Alcohol Use   ??? Yes     Comment: may be 3 times a year if that     Allergies   Allergen Reactions   ??? Morphine Anaphylaxis     Tolerates oxycodone without problem   ??? Avalide [Irbesartan-Hydrochlorothiazide] Myalgia   ??? Betadine [Povidone-Iodine] Rash   ??? Demerol [Meperidine] Other (comments)     Makes her feel crazy   ??? Pcn [Penicillins] Swelling   ??? Prinivil [Lisinopril] Cough   ??? Statins-Hmg-Coa Reductase Inhibitors Myalgia   ??? Sulfa (Sulfonamide Antibiotics) Unknown (comments)   ??? Tricor [employer]  Micronized] Myalgia       Current Outpatient Prescriptions   Medication Sig   ??? ferrous sulfate (IRON) 325 mg (65 mg iron) EC tablet Take 1 Tab by mouth daily.   ??? losartan (COZAAR) 25 mg tablet Take 1 Tab by mouth daily.   ??? pravastatin (PRAVACHOL) 40 mg tablet Take 1 Tab by mouth nightly.   ??? carvedilol (COREG) 25 mg tablet Take 1 Tab by mouth two (2) times a day.   ??? nitroglycerin (NITROSTAT) 0.4 mg SL tablet 1 Tab by SubLINGual route  every five (5) minutes as needed for Chest Pain for up to 3 doses.   ??? clopidogrel (PLAVIX) 75 mg tab Take 1 Tab by mouth daily for 360 days.   ??? aspirin delayed-release 81 mg tablet Take 1 Tab by mouth daily.   ??? ezetimibe (ZETIA) 10 mg tablet TAKE 1 TAB BY MOUTH NIGHTLY.   ??? bumetanide (BUMEX) 2 mg tablet Take 2 mg by mouth daily.   ??? gabapentin (NEURONTIN) 300 mg capsule Take 300 mg by mouth daily.   ??? gabapentin (NEURONTIN) 300 mg capsule Take 1,200 mg by mouth nightly.   ??? metFORMIN (GLUMETZA ER) 500 mg TG24 24 hour tablet Take 1,000 mg by mouth two (2) times a day.   ??? acetaminophen (ACETAMINOPHEN EXTRA STRENGTH) 500 mg tablet Take 1,000 mg by mouth every six (6) hours. With tramadol   ??? nabumetone (RELAFEN) 500 mg tablet Take 500 mg by mouth two (2) times a day.   ??? methocarbamol (ROBAXIN) 500 mg tablet Take 1,000 mg by mouth two (2) times daily as needed. Patient take by mouth as needed   ??? Cholecalciferol, Vitamin D3, (VITAMIN D3) 2,000 unit cap capsule One capsule by mouth daily   ??? insulin detemir (LEVEMIR FLEXTOUCH) 100 unit/mL (3 mL) inpn 38 Units by SubCUTAneous route nightly.   ??? VESICARE 10 mg tablet TAKE 1 TAB BY MOUTH DAILY.   ??? DULoxetine (CYMBALTA) 60 mg capsule TAKE 1 CAPSULE BY MOUTH DAILY   ??? pantoprazole (PROTONIX) 40 mg tablet TAKE 1 TABLET BY MOUTH DAILY     No current facility-administered medications for this visit.        Marcellus Scotthristine M Dyonna Jaspers, MD  [facility]   [address], [address]  [place], [place] [id number]  [phone]

## 2016-10-23 NOTE — Progress Notes (Signed)
Kelsey [name] is a 65 y.o. female and presents with   Chief Complaint   Patient presents with   ??? Anemia   .  Patient discharged from the hospital 10/31 after admission for acute blood loss anemia due to cardiac cath Friday.  She was admitted with a hemoglobin of 7- was transfused two units and discharge hemoglobin was 9.2  She is still feeling doe, and lightheaded with standing      Current Outpatient Prescriptions   Medication Sig Dispense Refill   ??? ferrous sulfate (IRON) 325 mg (65 mg iron) EC tablet Take 1 Tab by mouth daily. 90 Tab 3   ??? gabapentin (NEURONTIN) 300 mg capsule Take 300 mg by mouth daily.     ??? gabapentin (NEURONTIN) 300 mg capsule Take 1,200 mg by mouth nightly.     ??? metFORMIN (GLUMETZA ER) 500 mg TG24 24 hour tablet Take 1,000 mg by mouth two (2) times a day.     ??? acetaminophen (ACETAMINOPHEN EXTRA STRENGTH) 500 mg tablet Take 1,000 mg by mouth every six (6) hours. With tramadol     ??? losartan (COZAAR) 25 mg tablet Take 1 Tab by mouth daily. 30 Tab 1   ??? pravastatin (PRAVACHOL) 40 mg tablet Take 1 Tab by mouth nightly. 90 Tab 4   ??? carvedilol (COREG) 25 mg tablet Take 1 Tab by mouth two (2) times a day. 180 Tab 3   ??? nitroglycerin (NITROSTAT) 0.4 mg SL tablet 1 Tab by SubLINGual route every five (5) minutes as needed for Chest Pain for up to 3 doses. 1 Bottle 1   ??? nabumetone (RELAFEN) 500 mg tablet Take 500 mg by mouth two (2) times a day.     ??? clopidogrel (PLAVIX) 75 mg tab Take 1 Tab by mouth daily for 360 days. 30 Tab 11   ??? methocarbamol (ROBAXIN) 500 mg tablet Take 1,000 mg by mouth two (2) times daily as needed. Patient take by mouth as needed     ??? Cholecalciferol, Vitamin D3, (VITAMIN D3) 2,000 unit cap capsule One capsule by mouth daily 90 Cap 3   ??? aspirin delayed-release 81 mg tablet Take 1 Tab by mouth daily. 90 Tab 3   ??? insulin detemir (LEVEMIR FLEXTOUCH) 100 unit/mL (3 mL) inpn 38 Units by SubCUTAneous route nightly.      ??? VESICARE 10 mg tablet TAKE 1 TAB BY MOUTH DAILY. 90 Tab 4   ??? ezetimibe (ZETIA) 10 mg tablet TAKE 1 TAB BY MOUTH NIGHTLY. 90 Tab 3   ??? DULoxetine (CYMBALTA) 60 mg capsule TAKE 1 CAPSULE BY MOUTH DAILY 90 Cap 2   ??? pantoprazole (PROTONIX) 40 mg tablet TAKE 1 TABLET BY MOUTH DAILY 90 Tab 3   ??? bumetanide (BUMEX) 2 mg tablet Take 2 mg by mouth daily.       Allergies   Allergen Reactions   ??? Morphine Anaphylaxis     Tolerates oxycodone without problem   ??? Avalide [Irbesartan-Hydrochlorothiazide] Myalgia   ??? Betadine [Povidone-Iodine] Rash   ??? Demerol [Meperidine] Other (comments)     Makes her feel crazy   ??? Pcn [Penicillins] Swelling   ??? Prinivil [Lisinopril] Cough   ??? Statins-Hmg-Coa Reductase Inhibitors Myalgia   ??? Sulfa (Sulfonamide Antibiotics) Unknown (comments)   ??? Tricor [Fenofibrate Micronized] Myalgia     Past Medical History:   Diagnosis Date   ??? Acute MI     x3   ??? Arthritis    ??? CAD (coronary artery disease)    ???  Cervical spondylolysis    ??? Chronic pain    ??? COPD    ??? Diabetes (HCC)    ??? GERD (gastroesophageal reflux disease)    ??? Goiter 04/20/2012   ??? Hypertension    ??? Neuropathy 04/06/2013     Past Surgical History:   Procedure Laterality Date   ??? 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 CARDIAC SURG PROCEDURE UNLIST      PTCA   ??? HX KNEE ARTHROSCOPY Right     X2   ??? HX OTHER SURGICAL  1970    GANGLION CYST   ??? HX TONSILLECTOMY  1963     Family History   Problem Relation Age of Onset   ??? Lung Disease Mother    ??? Stroke Father    ??? Lung Disease Brother      COPD   ??? Hypertension Brother    ??? Cancer Brother      PROSTATE   ??? Heart Disease Brother    ??? Diabetes Brother    ??? Anesth Problems Neg Hx      Social History   Substance Use Topics   ??? Smoking status: Current Every Day Smoker     Packs/day: 0.30     Years: 45.00     Types: Cigarettes   ??? Smokeless tobacco: Never Used      Comment: passed ready   ??? Alcohol use Yes      Comment: may be 3 times a year if that       The patient has a history of falls. A plan of care for falls was documented..  Depression screen pos- treated      Objective:  Visit Vitals   ??? BP 110/70   ??? Pulse 67   ??? SpO2 97%     wdwn 65 yo wf  In NAD. A&O.  HEENT -- Pupils round.  O/P Clear.  Neck -- Supple. No JVD.  Heart -- RRR. No R/M/G.  Lungs -- CTA.  Abdomen -- Soft. Non-tender. Non-distended. No masses. Bowel sounds present.  Extremities -- right leg edema. Resolving ecchymosis      Results for orders placed or performed in visit on 10/23/16   AMB POC COMPLETE CBC,AUTOMATED ENTER   Result Value Ref Range    WBC (POC) 9.0 4.5 - 10.5 K/uL    LYMPHOCYTES (POC) 27.1 20.5 - 51.1 %    MONOCYTES (POC) 11.2 (A) 1.7 - 9.3 %    GRANULOCYTES (POC) 61.7 42.2 - 75.2 %    ABS. LYMPHS (POC) 2.4 1.2 - 3.4 K/uL    ABS. MONOS (POC) 1.0 (A) 0.1 - 0.6 10^3/ul    ABS. GRANS (POC) 5.6 1.4 - 6.5 10^3/ul    RBC (POC) 3.14 (A) 4.00 - 6.00 M/uL    HGB (POC) 8.8 (A) 11 - 18 g/dL    HCT (POC) 16.1 (A) 09.6 - 60.0 %    MCV (POC) 83.9 80.0 - 99.9 fL    MCH (POC) 28.1 27.0 - 31.0 pg    MCHC (POC) 33.5 33.0 - 37.0 g/dL    RDW (POC) 04.5 (A) 40.9 - 13.7 %    PLATELET (POC) 236 150 - 450 K/uL    MPV (POC) 7.3 (A) 7.8 - 11 fL       Assessment/Plan:    ICD-10-CM ICD-9-CM    1. Iron deficiency anemia due to chronic blood loss D50.0 280.0 AMB POC COMPLETE CBC,AUTOMATED ENTER   2. Long term (  current) use of insulin (HCC) Z79.4 V58.67    3. Hypertension complicating diabetes (HCC) E11.59 250.80     I10 401.9    4. Neuropathy G62.9 355.9    5. Atherosclerosis of native coronary artery of native heart without angina pectoris I25.10 414.01    6. Uncontrolled type 2 diabetes mellitus with other diabetic arthropathy, with long-term current use of insulin (HCC) E11.618 250.82     Z79.4 716.90     E11.65 V58.67      Start iron tid      Author:  Lake BellsNancy J Zaiden Ludlum, MD 10/23/2016 3:16 PM

## 2016-10-24 ENCOUNTER — Encounter

## 2016-10-24 MED ORDER — NABUMETONE 500 MG TAB
500 mg | ORAL_TABLET | ORAL | 1 refills | Status: DC
Start: 2016-10-24 — End: 2016-11-20

## 2016-10-27 ENCOUNTER — Ambulatory Visit: Admit: 2016-10-27 | Discharge: 2016-10-27 | Payer: MEDICARE | Attending: Internal Medicine | Primary: Internal Medicine

## 2016-10-27 DIAGNOSIS — D500 Iron deficiency anemia secondary to blood loss (chronic): Secondary | ICD-10-CM

## 2016-10-27 LAB — AMB POC COMPLETE CBC,AUTOMATED ENTER
ABS. GRANS (POC): 6 10*3/uL (ref 1.4–6.5)
ABS. LYMPHS (POC): 2.1 10*3/uL (ref 1.2–3.4)
ABS. MONOS (POC): 1.3 10*3/uL — AB (ref 0.1–0.6)
GRANULOCYTES (POC): 63.4 % (ref 42.2–75.2)
HCT (POC): 29.8 % — AB (ref 35.0–60.0)
HGB (POC): 9.9 g/dL — AB (ref 11–18)
LYMPHOCYTES (POC): 22.6 % (ref 20.5–51.1)
MCH (POC): 27.9 pg (ref 27.0–31.0)
MCHC (POC): 33.3 g/dL (ref 33.0–37.0)
MCV (POC): 84 fL (ref 80.0–99.9)
MONOCYTES (POC): 14 % — AB (ref 1.7–9.3)
MPV (POC): 7.5 fL — AB (ref 7.8–11)
PLATELET (POC): 279 10*3/uL (ref 150–450)
RBC (POC): 3.54 M/uL — AB (ref 4.00–6.00)
RDW (POC): 16.8 % — AB (ref 11.6–13.7)
WBC (POC): 9.5 10*3/uL (ref 4.5–10.5)

## 2016-10-27 NOTE — Progress Notes (Signed)
Kelsey [name] is a 65 y.o. female and presents with   Chief Complaint   Patient presents with   ??? Groin Pain   .  Patient continues to have pain in right groin at cath site.  Seemed to be bigger and more painful over the weekend.  Patient is tolerating her iron.  Partner c/o her memory is worse.      Current Outpatient Prescriptions   Medication Sig Dispense Refill   ??? nabumetone (RELAFEN) 500 mg tablet TAKE 1 TAB BY MOUTH TWO (2) TIMES A DAY. 30 Tab 1   ??? ferrous sulfate (IRON) 325 mg (65 mg iron) EC tablet Take 1 Tab by mouth daily. 90 Tab 3   ??? gabapentin (NEURONTIN) 300 mg capsule Take 300 mg by mouth daily.     ??? gabapentin (NEURONTIN) 300 mg capsule Take 1,200 mg by mouth nightly.     ??? metFORMIN (GLUMETZA ER) 500 mg TG24 24 hour tablet Take 1,000 mg by mouth two (2) times a day.     ??? acetaminophen (ACETAMINOPHEN EXTRA STRENGTH) 500 mg tablet Take 1,000 mg by mouth every six (6) hours. With tramadol     ??? losartan (COZAAR) 25 mg tablet Take 1 Tab by mouth daily. 30 Tab 1   ??? pravastatin (PRAVACHOL) 40 mg tablet Take 1 Tab by mouth nightly. 90 Tab 4   ??? carvedilol (COREG) 25 mg tablet Take 1 Tab by mouth two (2) times a day. 180 Tab 3   ??? nitroglycerin (NITROSTAT) 0.4 mg SL tablet 1 Tab by SubLINGual route every five (5) minutes as needed for Chest Pain for up to 3 doses. 1 Bottle 1   ??? nabumetone (RELAFEN) 500 mg tablet Take 500 mg by mouth two (2) times a day.     ??? clopidogrel (PLAVIX) 75 mg tab Take 1 Tab by mouth daily for 360 days. 30 Tab 11   ??? methocarbamol (ROBAXIN) 500 mg tablet Take 1,000 mg by mouth two (2) times daily as needed. Patient take by mouth as needed     ??? Cholecalciferol, Vitamin D3, (VITAMIN D3) 2,000 unit cap capsule One capsule by mouth daily 90 Cap 3   ??? aspirin delayed-release 81 mg tablet Take 1 Tab by mouth daily. 90 Tab 3   ??? insulin detemir (LEVEMIR FLEXTOUCH) 100 unit/mL (3 mL) inpn 38 Units by SubCUTAneous route nightly.      ??? VESICARE 10 mg tablet TAKE 1 TAB BY MOUTH DAILY. 90 Tab 4   ??? ezetimibe (ZETIA) 10 mg tablet TAKE 1 TAB BY MOUTH NIGHTLY. 90 Tab 3   ??? DULoxetine (CYMBALTA) 60 mg capsule TAKE 1 CAPSULE BY MOUTH DAILY 90 Cap 2   ??? pantoprazole (PROTONIX) 40 mg tablet TAKE 1 TABLET BY MOUTH DAILY 90 Tab 3   ??? bumetanide (BUMEX) 2 mg tablet Take 2 mg by mouth daily.       Allergies   Allergen Reactions   ??? Morphine Anaphylaxis     Tolerates oxycodone without problem   ??? Avalide [Irbesartan-Hydrochlorothiazide] Myalgia   ??? Betadine [Povidone-Iodine] Rash   ??? Demerol [Meperidine] Other (comments)     Makes her feel crazy   ??? Pcn [Penicillins] Swelling   ??? Prinivil [Lisinopril] Cough   ??? Statins-Hmg-Coa Reductase Inhibitors Myalgia   ??? Sulfa (Sulfonamide Antibiotics) Unknown (comments)   ??? Tricor [Fenofibrate Micronized] Myalgia     Past Medical History:   Diagnosis Date   ??? Acute MI     x3   ???  Arthritis    ??? CAD (coronary artery disease)    ??? Cervical spondylolysis    ??? Chronic pain    ??? COPD    ??? Diabetes (HCC)    ??? GERD (gastroesophageal reflux disease)    ??? Goiter 04/20/2012   ??? Hypertension    ??? Neuropathy 04/06/2013     Past Surgical History:   Procedure Laterality Date   ??? 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 CARDIAC SURG PROCEDURE UNLIST      PTCA   ??? HX KNEE ARTHROSCOPY Right     X2   ??? HX OTHER SURGICAL  1970    GANGLION CYST   ??? HX TONSILLECTOMY  1963     Family History   Problem Relation Age of Onset   ??? Lung Disease Mother    ??? Stroke Father    ??? Lung Disease Brother      COPD   ??? Hypertension Brother    ??? Cancer Brother      PROSTATE   ??? Heart Disease Brother    ??? Diabetes Brother    ??? Anesth Problems Neg Hx      Social History   Substance Use Topics   ??? Smoking status: Current Every Day Smoker     Packs/day: 0.30     Years: 45.00     Types: Cigarettes   ??? Smokeless tobacco: Never Used      Comment: passed ready   ??? Alcohol use Yes      Comment: may be 3 times a year if that            Objective:  Visit Vitals    ??? BP 100/60     wdwn 65 yo wf  In NAD. A&O.  HEENT -- Pupils round.  O/P Clear.  Neck -- Supple. No JVD.  Heart -- RRR. No R/M/G.  Lungs -- CTA.  Abdomen -- Soft. Non-tender. Non-distended. No masses. Bowel sounds present. Right groin- 10 cm firm tender swelling, no bruits, no pulsations  Extremities -- +2 edema. Right leg , ecchymosis from groin to ankle. Poor pulses        Assessment/Plan:    ICD-10-CM ICD-9-CM    1. Iron deficiency anemia due to chronic blood loss D50.0 280.0 AMB POC COMPLETE CBC,AUTOMATED ENTER   2. 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 Long term (current) use of insulin (HCC) Z79.4 V58.67    4. Hypertension complicating diabetes (HCC) E11.59 250.80     I10 401.9      Hold losartan until sbp > 140  Continue iron .  Heat to groin,  Call if worse.    Author:  Lake BellsNancy J Sulay Brymer, MD 10/27/2016 1:27 PM

## 2016-11-04 NOTE — Telephone Encounter (Signed)
11/04/2016: Cardiac Wellness: Called Kelsey [name] regarding absence from the Cardiac Wellness Program. Possible hip surgery in the future. She will call us if she wants to start CWP.  Kelsey [name]

## 2016-11-05 MED ORDER — GABAPENTIN 300 MG CAP
300 mg | ORAL_CAPSULE | ORAL | 0 refills | Status: DC
Start: 2016-11-05 — End: 2017-09-23

## 2016-11-06 ENCOUNTER — Inpatient Hospital Stay: Admit: 2016-11-06 | Payer: MEDICARE | Attending: Internal Medicine | Primary: Internal Medicine

## 2016-11-06 ENCOUNTER — Ambulatory Visit: Admit: 2016-11-06 | Discharge: 2016-11-06 | Payer: MEDICARE | Attending: Internal Medicine | Primary: Internal Medicine

## 2016-11-06 DIAGNOSIS — M7989 Other specified soft tissue disorders: Secondary | ICD-10-CM

## 2016-11-06 DIAGNOSIS — I25119 Atherosclerotic heart disease of native coronary artery with unspecified angina pectoris: Secondary | ICD-10-CM

## 2016-11-06 LAB — AMB POC COMPLETE CBC,AUTOMATED ENTER
ABS. GRANS (POC): 7.1 10*3/uL — AB (ref 1.4–6.5)
ABS. LYMPHS (POC): 2.5 10*3/uL (ref 1.2–3.4)
ABS. MONOS (POC): 0.5 10*3/uL (ref 0.1–0.6)
GRANULOCYTES (POC): 70.6 % (ref 42.2–75.2)
HCT (POC): 35 % (ref 35.0–60.0)
HGB (POC): 11.3 g/dL (ref 11–18)
LYMPHOCYTES (POC): 24.6 % (ref 20.5–51.1)
MCH (POC): 28.6 pg (ref 27.0–31.0)
MCHC (POC): 32.4 g/dL — AB (ref 33.0–37.0)
MCV (POC): 88.3 fL (ref 80.0–99.9)
MONOCYTES (POC): 4.8 % (ref 1.7–9.3)
MPV (POC): 7.5 fL — AB (ref 7.8–11)
PLATELET (POC): 289 10*3/uL (ref 150–450)
RBC (POC): 3.96 M/uL — AB (ref 4.00–6.00)
RDW (POC): 18.9 % — AB (ref 11.6–13.7)
WBC (POC): 10.1 10*3/uL (ref 4.5–10.5)

## 2016-11-06 LAB — AMB POC GLUCOSE BLOOD, BY GLUCOSE MONITORING DEVICE: Glucose POC: 127 mg/dL — AB (ref 65–99)

## 2016-11-06 NOTE — Patient Instructions (Signed)
Medicare Wellness Visit, Female    The best way to live healthy is to have a healthy lifestyle by eating a well-balanced diet, exercising regularly, limiting alcohol and stopping smoking.    Regular physical exams and screening tests are another way to keep healthy. Preventive exams provided by your health care provider can find health problems before they become diseases or illnesses. Preventive services including immunizations, screening tests, monitoring and exams can help you take care of your own health.    All people over age 66 should have a pneumovax  and and a prevnar shot to prevent pneumonia. These are once in a lifetime unless you and your provider decide differently.    All people over 65 should have a yearly flu shot and a tetanus vaccine every 10 years.    A bone mass density to screen for osteoporosis or thinning of the bones should be done every 2 years after 65.    Screening for diabetes mellitus with a blood sugar test should be done every year.    Glaucoma is a disease of the eye due to increased ocular pressure that can lead to blindness and it should be done every year by an eye professional.    Cardiovascular screening tests that check for elevated lipids (fatty part of blood) which can lead to heart disease and strokes should be done every 5 years.    Colorectal screening that evaluates for blood or polyps in your colon should be done yearly as a stool test or every five years as a flexible sigmoidoscope or every 10 years as a colonoscopy up to age 66.    Breast cancer screening with a mammogram is recommended biennially  for women age 66-74.    Screening for cervical cancer with a pap smear and pelvic exam is recommended for women after age 66 years every 2 years up to age 66 or when the provider and patient decide to stop.If there is a history of cervical abnormalities or other increased risk for cancer then the test is recommended yearly.     Hepatitis C screening is also recommended for anyone born between [id number] through 1965.    A shingles vaccine is also recommended once in a lifetime after age 66.    Your Medicare Wellness Exam is recommended annually.    Here is a list of your current Health Maintenance items with a due date:  Health Maintenance Due   Topic Date Due   ??? Eye Exam  12/09/1960   ??? Shingles Vaccine  10/09/2010   ??? Stool testing for trace blood  10/01/2013   ??? Breast Cancer Screening  12/06/2016

## 2016-11-06 NOTE — ACP (Advance Care Planning) (Deleted)
Advance Care Planning    Advance Care Planning (ACP) Provider Conversation Snapshot    Date of ACP Conversation: 11/06/16  Persons included in Conversation:  patient and POA  Length of ACP Conversation in minutes:  16 minutes    Authorized Management consultantDecision Maker (if patient is incapable of making informed decisions):   This person is:   Education officer, museumHealthcare Agent/Medical Power of Attorney under Advance Directive          For Patients with Decision Making Capacity:   Values/Goals: Exploration of values, goals, and preferences if recovery is not expected, even with continued medical treatment in the event of:  Imminent death  Severe, permanent brain injury    Conversation Outcomes / Follow-Up Plan:   Recommended completion of Advance Directive form after review of ACP materials and conversation with prospective healthcare agent

## 2016-11-06 NOTE — Progress Notes (Signed)
Kelsey [name] is a 65 y.o. female and presents with   Chief Complaint   Patient presents with   ??? Follow-up     2 wks   .  Patient still having pain in right leg.  Has sore place on calf.  Slowly getting energy back. Denies chest pain.  Has chronic sob.      Current Outpatient Prescriptions   Medication Sig Dispense Refill   ??? gabapentin (NEURONTIN) 300 mg capsule TAKE 3 CAPSULES BY MOUTH 3 TIMES A DAY 810 Cap 0   ??? nabumetone (RELAFEN) 500 mg tablet TAKE 1 TAB BY MOUTH TWO (2) TIMES A DAY. 30 Tab 1   ??? ferrous sulfate (IRON) 325 mg (65 mg iron) EC tablet Take 1 Tab by mouth daily. 90 Tab 3   ??? gabapentin (NEURONTIN) 300 mg capsule Take 300 mg by mouth daily.     ??? gabapentin (NEURONTIN) 300 mg capsule Take 1,200 mg by mouth nightly.     ??? metFORMIN (GLUMETZA ER) 500 mg TG24 24 hour tablet Take 1,000 mg by mouth two (2) times a day.     ??? acetaminophen (ACETAMINOPHEN EXTRA STRENGTH) 500 mg tablet Take 1,000 mg by mouth every six (6) hours. With tramadol     ??? losartan (COZAAR) 25 mg tablet Take 1 Tab by mouth daily. 30 Tab 1   ??? pravastatin (PRAVACHOL) 40 mg tablet Take 1 Tab by mouth nightly. 90 Tab 4   ??? carvedilol (COREG) 25 mg tablet Take 1 Tab by mouth two (2) times a day. 180 Tab 3   ??? nitroglycerin (NITROSTAT) 0.4 mg SL tablet 1 Tab by SubLINGual route every five (5) minutes as needed for Chest Pain for up to 3 doses. 1 Bottle 1   ??? nabumetone (RELAFEN) 500 mg tablet Take 500 mg by mouth two (2) times a day.     ??? clopidogrel (PLAVIX) 75 mg tab Take 1 Tab by mouth daily for 360 days. 30 Tab 11   ??? methocarbamol (ROBAXIN) 500 mg tablet Take 1,000 mg by mouth two (2) times daily as needed. Patient take by mouth as needed     ??? Cholecalciferol, Vitamin D3, (VITAMIN D3) 2,000 unit cap capsule One capsule by mouth daily 90 Cap 3   ??? aspirin delayed-release 81 mg tablet Take 1 Tab by mouth daily. 90 Tab 3   ??? insulin detemir (LEVEMIR FLEXTOUCH) 100 unit/mL (3 mL) inpn 38 Units by SubCUTAneous route nightly.      ??? VESICARE 10 mg tablet TAKE 1 TAB BY MOUTH DAILY. 90 Tab 4   ??? ezetimibe (ZETIA) 10 mg tablet TAKE 1 TAB BY MOUTH NIGHTLY. 90 Tab 3   ??? DULoxetine (CYMBALTA) 60 mg capsule TAKE 1 CAPSULE BY MOUTH DAILY 90 Cap 2   ??? pantoprazole (PROTONIX) 40 mg tablet TAKE 1 TABLET BY MOUTH DAILY 90 Tab 3   ??? bumetanide (BUMEX) 2 mg tablet Take 2 mg by mouth daily.       Allergies   Allergen Reactions   ??? Morphine Anaphylaxis     Tolerates oxycodone without problem   ??? Avalide [Irbesartan-Hydrochlorothiazide] Myalgia   ??? Betadine [Povidone-Iodine] Rash   ??? Demerol [Meperidine] Other (comments)     Makes her feel crazy   ??? Pcn [Penicillins] Swelling   ??? Prinivil [Lisinopril] Cough   ??? Statins-Hmg-Coa Reductase Inhibitors Myalgia   ??? Sulfa (Sulfonamide Antibiotics) Unknown (comments)   ??? Tricor [Fenofibrate Micronized] Myalgia     Past Medical History:  Diagnosis Date   ??? Acute MI     x3   ??? Arthritis    ??? CAD (coronary artery disease)    ??? Cervical spondylolysis    ??? Chronic pain    ??? COPD    ??? Diabetes ([place])    ??? GERD (gastroesophageal reflux disease)    ??? Goiter 04/20/2012   ??? Hypertension    ??? Neuropathy 04/06/2013     Past Surgical History:   Procedure Laterality Date   ??? 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 CARDIAC SURG PROCEDURE UNLIST      PTCA   ??? HX KNEE ARTHROSCOPY Right     X2   ??? HX OTHER SURGICAL  1970    GANGLION CYST   ??? HX TONSILLECTOMY  1963     Family History   Problem Relation Age of Onset   ??? Lung Disease Mother    ??? Stroke Father    ??? Lung Disease Brother      COPD   ??? Hypertension Brother    ??? Cancer Brother      PROSTATE   ??? Heart Disease Brother    ??? Diabetes Brother    ??? Anesth Problems Neg Hx      Social History   Substance Use Topics   ??? Smoking status: Current Every Day Smoker     Packs/day: 0.30     Years: 45.00     Types: Cigarettes   ??? Smokeless tobacco: Never Used      Comment: passed ready   ??? Alcohol use Yes      Comment: may be 3 times a year if that       The patient has a history of falls. A plan of care for falls was documented..  Depression screen positive, medication was prescribed, drug therapy education given.      Objective:  Visit Vitals   ??? BP 130/82   ??? Ht '5\' 6"'  (1.676 m)   ??? Wt 211 lb (95.7 kg)   ??? BMI 34.06 kg/m2     wdwn 65 yo wf  In NAD. A&O.  HEENT -- Pupils round.  O/P Clear.  Neck -- Supple. No JVD.  Heart -- RRR. 1/6 asm  Lungs -- CTA.  Abdomen -- Soft. Non-tender. Non-distended. No masses. Bowel sounds present.  Extremities -- +2 edema.right leg.  +calf tenderness. Poor pulses      Results for orders placed or performed in visit on 11/06/16   AMB POC COMPLETE CBC,AUTOMATED ENTER   Result Value Ref Range    WBC (POC) 10.1 4.5 - 10.5 K/uL    LYMPHOCYTES (POC) 24.6 20.5 - 51.1 %    MONOCYTES (POC) 4.8 1.7 - 9.3 %    GRANULOCYTES (POC) 70.6 42.2 - 75.2 %    ABS. LYMPHS (POC) 2.5 1.2 - 3.4 K/uL    ABS. MONOS (POC) 0.5 0.1 - 0.6 10^3/ul    ABS. GRANS (POC) 7.1 (A) 1.4 - 6.5 10^3/ul    RBC (POC) 3.96 (A) 4.00 - 6.00 M/uL    HGB (POC) 11.3 11 - 18 g/dL    HCT (POC) 35.0 35.0 - 60.0 %    MCV (POC) 88.3 80.0 - 99.9 fL    MCH (POC) 28.6 27.0 - 31.0 pg    MCHC (POC) 32.4 (A) 33.0 - 37.0 g/dL    RDW (POC) 18.9 (A) 11.6 - 13.7 %    PLATELET (POC) 289 150 - 450 K/uL  MPV (POC) 7.5 (A) 7.8 - 11 fL   AMB POC GLUCOSE BLOOD, BY GLUCOSE MONITORING DEVICE   Result Value Ref Range    Glucose POC 127 (A) 65 - 99 mg/dL         Assessment/Plan:    ICD-10-CM ICD-9-CM    1. Coronary artery disease involving native heart with angina pectoris, unspecified vessel or lesion type ([place]) I25.119 414.01      413.9    2. Initial Medicare annual wellness visit Z00.00 V70.0 ADVANCE CARE PLANNING FIRST 30 MINS   3. Advanced directives, counseling/discussion Z71.89 V65.49 ADVANCE CARE PLANNING FIRST 30 MINS   4. Long term (current) use of insulin (HCC) Z79.4 V58.67    5. Hypertension complicating diabetes ([place]) E11.59 250.80     I10 401.9     6. Swelling R60.9 782.3 DUPLEX LOWER EXT VENOUS RIGHT   7. Iron deficiency anemia due to chronic blood loss D50.0 280.0 AMB POC COMPLETE CBC,AUTOMATED ENTER   8. 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 Follow up one month      Author:  Genene Churn, MD 11/06/2016 5:27 PM

## 2016-11-06 NOTE — Progress Notes (Deleted)
This is an Initial Medicare Annual Wellness Exam (AWV) (Performed 12 months after IPPE or effective date of Medicare Part B enrollment, Once in a lifetime)    I have reviewed the patient's medical history in detail and updated the computerized patient record.     History     Past Medical History:   Diagnosis Date   ??? Acute MI     x3   ??? Arthritis    ??? CAD (coronary artery disease)    ??? Cervical spondylolysis    ??? Chronic pain    ??? COPD    ??? Diabetes (HCC)    ??? GERD (gastroesophageal reflux disease)    ??? Goiter 04/20/2012   ??? Hypertension    ??? Neuropathy 04/06/2013      Past Surgical History:   Procedure Laterality Date   ??? 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 CARDIAC SURG PROCEDURE UNLIST      PTCA   ??? HX KNEE ARTHROSCOPY Right     X2   ??? HX OTHER SURGICAL  1970    GANGLION CYST   ??? HX TONSILLECTOMY  1963     Current Outpatient Prescriptions   Medication Sig Dispense Refill   ??? gabapentin (NEURONTIN) 300 mg capsule TAKE 3 CAPSULES BY MOUTH 3 TIMES A DAY 810 Cap 0   ??? nabumetone (RELAFEN) 500 mg tablet TAKE 1 TAB BY MOUTH TWO (2) TIMES A DAY. 30 Tab 1   ??? ferrous sulfate (IRON) 325 mg (65 mg iron) EC tablet Take 1 Tab by mouth daily. 90 Tab 3   ??? gabapentin (NEURONTIN) 300 mg capsule Take 300 mg by mouth daily.     ??? gabapentin (NEURONTIN) 300 mg capsule Take 1,200 mg by mouth nightly.     ??? metFORMIN (GLUMETZA ER) 500 mg TG24 24 hour tablet Take 1,000 mg by mouth two (2) times a day.     ??? acetaminophen (ACETAMINOPHEN EXTRA STRENGTH) 500 mg tablet Take 1,000 mg by mouth every six (6) hours. With tramadol     ??? losartan (COZAAR) 25 mg tablet Take 1 Tab by mouth daily. 30 Tab 1   ??? pravastatin (PRAVACHOL) 40 mg tablet Take 1 Tab by mouth nightly. 90 Tab 4   ??? carvedilol (COREG) 25 mg tablet Take 1 Tab by mouth two (2) times a day. 180 Tab 3   ??? nitroglycerin (NITROSTAT) 0.4 mg SL tablet 1 Tab by SubLINGual route every five (5) minutes as needed for Chest Pain for up to 3 doses. 1 Bottle 1    ??? nabumetone (RELAFEN) 500 mg tablet Take 500 mg by mouth two (2) times a day.     ??? clopidogrel (PLAVIX) 75 mg tab Take 1 Tab by mouth daily for 360 days. 30 Tab 11   ??? methocarbamol (ROBAXIN) 500 mg tablet Take 1,000 mg by mouth two (2) times daily as needed. Patient take by mouth as needed     ??? Cholecalciferol, Vitamin D3, (VITAMIN D3) 2,000 unit cap capsule One capsule by mouth daily 90 Cap 3   ??? aspirin delayed-release 81 mg tablet Take 1 Tab by mouth daily. 90 Tab 3   ??? insulin detemir (LEVEMIR FLEXTOUCH) 100 unit/mL (3 mL) inpn 38 Units by SubCUTAneous route nightly.     ??? VESICARE 10 mg tablet TAKE 1 TAB BY MOUTH DAILY. 90 Tab 4   ??? ezetimibe (ZETIA) 10 mg tablet TAKE 1 TAB BY MOUTH NIGHTLY. 90 Tab 3   ???  DULoxetine (CYMBALTA) 60 mg capsule TAKE 1 CAPSULE BY MOUTH DAILY 90 Cap 2   ??? pantoprazole (PROTONIX) 40 mg tablet TAKE 1 TABLET BY MOUTH DAILY 90 Tab 3   ??? bumetanide (BUMEX) 2 mg tablet Take 2 mg by mouth daily.       Allergies   Allergen Reactions   ??? Morphine Anaphylaxis     Tolerates oxycodone without problem   ??? Avalide [Irbesartan-Hydrochlorothiazide] Myalgia   ??? Betadine [Povidone-Iodine] Rash   ??? Demerol [Meperidine] Other (comments)     Makes her feel crazy   ??? Pcn [Penicillins] Swelling   ??? Prinivil [Lisinopril] Cough   ??? Statins-Hmg-Coa Reductase Inhibitors Myalgia   ??? Sulfa (Sulfonamide Antibiotics) Unknown (comments)   ??? Tricor [Fenofibrate Micronized] Myalgia     Family History   Problem Relation Age of Onset   ??? Lung Disease Mother    ??? Stroke Father    ??? Lung Disease Brother      COPD   ??? Hypertension Brother    ??? Cancer Brother      PROSTATE   ??? Heart Disease Brother    ??? Diabetes Brother    ??? Anesth Problems Neg Hx      Social History   Substance Use Topics   ??? Smoking status: Current Every Day Smoker     Packs/day: 0.30     Years: 45.00     Types: Cigarettes   ??? Smokeless tobacco: Never Used      Comment: passed ready   ??? Alcohol use Yes      Comment: may be 3 times a year if that      Patient Active Problem List   Diagnosis Code   ??? Coronary atherosclerosis of native coronary artery I25.10   ??? Other chest pain R07.89   ??? Other dyspnea and respiratory abnormality R06.09, R09.89   ??? Mixed hyperlipidemia E78.2   ??? Type II diabetes mellitus, uncontrolled (HCC) E11.65   ??? Dyspnea R06.00   ??? Goiter E04.9   ??? Neuropathy G62.9   ??? Long term (current) use of insulin (HCC) Z79.4   ??? Hypertension complicating diabetes (HCC) E11.59, I10   ??? CAD (coronary artery disease) I25.10   ??? 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 Hypertensive urgency I16.0   ??? Anemia D64.9       Depression Risk Factor Screening:     PHQ over the last two weeks 10/27/2016   PHQ Not Done Active Diagnosis of Depression or Bipolar Disorder   Little interest or pleasure in doing things -   Feeling down, depressed or hopeless -   Total Score PHQ 2 -     Alcohol Risk Factor Screening:   You do not drink alcohol or very rarely.      Functional Ability and Level of Safety:     Hearing Loss  Hearing is good.    Activities of Daily Living  The home contains: handrails  Patient needs help with:  transportation, shopping, managing medications and managing money    Fall Risk  Fall Risk Assessment, last 12 mths 10/27/2016   Able to walk? Yes   Fall in past 12 months? Yes   Fall with injury? Yes   Number of falls in past 12 months 3   Fall Risk Score 4       Abuse Screen  Patient is not abused    Cognitive Screening   Evaluation of Cognitive Function:  Has your family/caregiver stated any concerns about your memory:  yes  Abnormal    Patient Care Team   Patient Care Team:  Lake BellsNancy J Carolene Gitto, MD as PCP - General (Internal Medicine)  Marcellus Scotthristine M Browning, MD (Cardiology)  Renita PapaKatherine G Diefenderfer, NP (Nurse Practitioner)    Assessment/Plan   Education and counseling provided:  Are appropriate based on today's review and evaluation  End-of-Life planning (with patient's consent)         Health Maintenance Due   Topic Date Due    ??? EYE EXAM RETINAL OR DILATED Q1  12/09/1960   ??? ZOSTER VACCINE AGE 69>  10/09/2010   ??? FOBT Q 1 YEAR AGE 71-75  10/01/2013   ??? BREAST CANCER SCRN MAMMOGRAM  12/06/2016

## 2016-11-07 NOTE — Procedures (Signed)
[facility]  *** FINAL REPORT ***    Name: Kelsey [name], Kelsey [name]  MRN: [id number]    Outpatient  DOB: 09 Dec 1951  HIS Order #: [id number]  [facility] Visit #: [id number]  Date: 06 Nov 2016    TYPE OF TEST: Peripheral Venous Testing    REASON FOR TEST  Limb swelling    Right Leg:-  Deep venous thrombosis:           No  Superficial venous thrombosis:    No  Deep venous insufficiency:        Not examined  Superficial venous insufficiency: Not examined      INTERPRETATION/FINDINGS  PROCEDURE:  Color duplex ultrasound imaging of lower extremity veins.    FINDINGS:       Right: The common femoral, deep femoral, femoral, popliteal,  posterior tibial, peroneal, and great saphenous are patent and without   evidence of thrombus; each is is fully compressible and there is no  narrowing of the flow channel on color Doppler imaging.  Phasic flow  is observed in the common femoral vein.       Left:   The common femoral vein is patent and without evidence of   thrombus.  Phasic flow is observed.  This extremity was not otherwise   evaluated.    IMPRESSION:  No evidence of right lower extremity vein thrombosis.    ADDITIONAL COMMENTS    I have personally reviewed the data relevant to the interpretation of  this  study.    TECHNOLOGIST: Virgina OrganLakeysha Harris, RVS, RCS  Signed: 11/06/2016 05:42 PM    PHYSICIAN: Gaetana MichaelisGregg Cathline Dowen, MD  Signed: 11/07/2016 08:16 AM

## 2016-11-07 NOTE — Progress Notes (Signed)
pls call - congrats.  No evidence of dvt,  Try to increase exercise.

## 2016-11-07 NOTE — Procedures (Signed)
[facility]  *** FINAL REPORT ***    Name: [name], Kelsey  MRN: [id number]    Outpatient  DOB: 09 Dec 1951  HIS Order #: [id number]  TRAKnet Visit #: [id number]  Date: 06 Nov 2016    TYPE OF TEST: Peripheral Venous Testing    REASON FOR TEST  Limb swelling    Right Leg:-  Deep venous thrombosis:           No  Superficial venous thrombosis:    No  Deep venous insufficiency:        Not examined  Superficial venous insufficiency: Not examined      INTERPRETATION/FINDINGS  PROCEDURE:  Color duplex ultrasound imaging of lower extremity veins.    FINDINGS:       Right: The common femoral, deep femoral, femoral, popliteal,  posterior tibial, peroneal, and great saphenous are patent and without   evidence of thrombus; each is is fully compressible and there is no  narrowing of the flow channel on color Doppler imaging.  Phasic flow  is observed in the common femoral vein.       Left:   The common femoral vein is patent and without evidence of   thrombus.  Phasic flow is observed.  This extremity was not otherwise   evaluated.    IMPRESSION:  No evidence of right lower extremity vein thrombosis.    ADDITIONAL COMMENTS    I have personally reviewed the data relevant to the interpretation of  this  study.    TECHNOLOGIST: Lakeysha Harris, RVS, RCS  Signed: 11/06/2016 05:42 PM    PHYSICIAN: Kyser Wandel, MD  Signed: 11/07/2016 08:16 AM

## 2016-11-07 NOTE — Progress Notes (Signed)
Called patient and relayed doctor's message

## 2016-11-20 ENCOUNTER — Encounter

## 2016-11-20 MED ORDER — NABUMETONE 500 MG TAB
500 mg | ORAL_TABLET | ORAL | 1 refills | Status: DC
Start: 2016-11-20 — End: 2016-12-29

## 2016-11-20 MED ORDER — CARVEDILOL 12.5 MG TAB
12.5 mg | ORAL_TABLET | ORAL | 3 refills | Status: DC
Start: 2016-11-20 — End: 2017-01-08

## 2016-11-27 ENCOUNTER — Ambulatory Visit: Admit: 2016-11-27 | Discharge: 2016-11-27 | Payer: MEDICARE | Attending: Internal Medicine | Primary: Internal Medicine

## 2016-11-27 DIAGNOSIS — I25119 Atherosclerotic heart disease of native coronary artery with unspecified angina pectoris: Secondary | ICD-10-CM

## 2016-11-27 MED ORDER — TRAMADOL 50 MG TAB
50 mg | ORAL_TABLET | Freq: Four times a day (QID) | ORAL | 1 refills | Status: DC | PRN
Start: 2016-11-27 — End: 2017-01-08

## 2016-11-27 NOTE — Progress Notes (Signed)
[facility]  [phone]    HPI: Kelsey [name], a 65 y.o. year-old who presents for follow up regarding her CAD.    She is feeling ok today. She is getting better every day. Still has some swelling and pain in the right leg but gradually getting better. Using a cane, no falls or stumbles.   Occasional bp checks at home and in the 135/60 range  She has a lot of pain in the right foot at night and in the shoe, taking tramadol for it. She has taken it a few time a day for the pain with good results. She has a few left but running out.   Requests refill today and I will do that and have asked her to follow-up with Dr. Cherly HensenPahle for further discussion of pian mgmt.   Historic  [facility] when she presented to ER with elevated BP, jaw/throat/facial tightness and dyspnea  She had just been started losartan on 10/14/16 for elevated BP  Her troponin bumped to 0.71 and she underwent another cardiac cath which did not show any new CAD, no PCI performed  She was discharged but then came back to ER with right groin hematoma and acute anemia with Hgb 6.9, hypotension and dyspnea  She received 2 units of PRBCs, groin US was negative, right leg venous duplex was negative and she was discharged on 10/21/16 and her Hgb was 9.2 at the time  Since that time she has not had any fevers but has felt cold with her anemia   No abdominal pain or back pain   She denies chest pain   Eating more ice, has always done this  Right leg is ecchymotic, painful and swollen  She still feels dizzy but no syncope  She still has some dyspnea and feels tired  No chest pain or palpitations  She has been checking her blood pressure at home after starting losartan and it has been good  Her angina equivalent was jaw pain in the past  Has balance issues, on gabapentin for neuropathy, walks with a cane, needs hip surgery  Still smoking - on Chantix right now, when she comes off it she smokes more    Has had short term memory issues since her CABG/AVR  Difficulty with word finding, cognitive abilities are more limited  Has a hard time remembering people's names  Advised to follow back up with neurology previously     Good friend of Delton CoombesRose Bookman who passed away, former patient here  Her wife is here today with her    Assessment/Plan:  1.  CAD - hx of MI and PCI x 3, had 3 vessel CABG 06/08/2012 by Dr. Bary RichardBundy, s/p PCI/DES to diagonal 08/29/16, no progression of CAD on cardiac cath 10/17  -continue ASA, Plavix, Coreg and statin  -has Rx for NTG SL tabs PRN chest pain  2.  Severe AS - s/p bioprosthetic bovine AVR 06/08/2012, stable by TTE 8/17   3.  Carotid stenosis - right 50-69% stenosis, left 10-49% stenosis on duplex 8/17, on ASA and statin  4.  PAD s/p stent in distal right SFA, stent in left common iliac artery - last ABI 12/16 showed right 0.82 and left 0.88, on ASA and statin, smoking cessation  5.  Dyslipidemia - LDL 75, TG 282 in 6/17, on zetia and pravastatin 40mg  daily, had myalgias on fenofibrate, -check NMR  6.  DM Type 2 - Hgb A1c 6.3%,  on insulin   7.  HTN - well controlled on losartan and Coreg, asked her to check BP at home   8.  Chronic pain/arthritis - needs hip surgery with Dr. Jola Baptist  9.  Vitamin D deficiency - now on Vitamin D 2000 units daily   10.  RBBB - chronic  11.  Hyponatremia - chronic, stable  12.  Anemia - gradually improving     Cardiac Cath 10/17 -LCA:  LAD: 30% proximal after large D1, with competitive inflow from LIMA to mid LAD, D1 recently stented widely patent, LCX: 80% after small MOM, before large PLOM which has competitive SVG inflow, RCA: occluded mid, LIMA --> LAD widely patent unobstructed anasomosis, SVG --> PLOM widely patent, SVG --> LVBr of distal RCA widely patent, moderate disease proximal to SVG insertion, yielding ischemic potential in small PDA (similar to cath 6 weeks ago, not fixable and VERY unlikely to be problematic).   IMP: CAD post PCI, CABG with very limited ischemic potential  Cardiac Cath 9/17 - Patent LIMA to LAD, SVG's to RCA and LCX.  Very large diagonal has eccentric 90% stenosis, treated with 2.75 x 12 Xience DES good result.  Bioprosthetic AV not crossed given recent echo.  Echo 8/17 - LVEF 55-60%, no WMA, wall thickness was increased, dilated LA, atrial septum thickened, mildly dilated RA, mild MR  Carotid Duplex 8/17 -  50-79% R ICA stenosis, 10-49% stenosis of the L ICA, >50% stenosis of the left ECA  ABI 12/16 - right 0.82 and left 0.88  Echo 6/16 - LVEF 55%, no WMA, bovine AVR with normal function, no AI, mild AS, no pericardial effusion, grd 1 dd, MAC with calcified chords noted with mild sclerosis, mild MR, trace TR  Carotid duplex 6/16 - right 50-69% prox ICA stenosis with moderate hemodynamic compromise, left 1-49% prox ICA stenosis with mild hemodynamic compromise, no change compared to 2014  ABI 12/15 - right 0.70 and left 0.88  Peripheral angiogram 5/15 - stent in distal right SFA, stent in left common iliac artery  ABI 5/15 - right 0.68 and left 0.88  Abd CTA 5/15 - significant PVD, 80% proximal stenosis in celiac plexus, abdominal aorta with diffuse calcification and mural thrombus, mild disease in right and left renal arteries, moderate right iliac disease with CFA stenosis, SFA with moderate disease, left common iliac with ulcerated plaque proximally, moderate to severe disease in iliac system, SFA with moderate to severe diffuse disease  Echo 09/2012 - LVEF 45-50%, AVR ok, mild MR, mild TR  06/08/12 -CABG x 3 (LIMA to LAD, SVG to OM1, SVG to PLB) and 21 mm [facility] Ease pericardial prosthesis at [facility]  Cardiac Cath 06/04/2012 - mod AS (30mm gradient), mod AI, diffuse 3 vessel CAD  Cardiac Cath 05/2011 - LVEF 30%, occluded mid RCA, high grade stenosis distal LCx, mild AS s/p PCI/DES to LCx by Dr. Oscar La  Cardiac Cath 02/2009 - patent stents in RCA and LCx  PCI to LCx 2001  MI/PCI to RCA 2000     Soc Hx: smokes 1/4 ppd, drinks 2 glasses of wine/month, no drug use  Fam Hx: father had CVA at age 69, had a couple of MIs in his 55s and had arteries replaced with teflon, mother passed at age 23 from aortic aneurysm, brother passed age 50 from sepsis and had bad PVD with multiple bypass surgeries    She  has a past medical history of Acute MI; Arthritis; CAD (coronary artery disease); Cervical spondylolysis; Chronic  pain; COPD; Diabetes (HCC); GERD (gastroesophageal reflux disease); Goiter (04/20/2012); Hypertension; and Neuropathy (04/06/2013).    Cardiovascular ROS: no chest pain, positive for dyspnea and dizziness and fatigue   Respiratory ROS: no cough or wheezing  Neurological ROS: no TIA or stroke symptoms  All other systems negative except as above.     PE  Vitals:    11/27/16 1014   BP: 140/74   Pulse: 64   Resp: 16   Weight: 209 lb (94.8 kg)   Height: 5\' 6"  (1.676 m)    Body mass index is 33.73 kg/(m^2).   General appearance - alert, well appearing, and in no distress  Mental status - affect appropriate to mood  Eyes - sclera anicteric, moist mucous membranes  Neck - supple, bilateral carotid bruits   Lymphatics - not assessed  Chest - clear to auscultation, no wheezes, rales or rhonchi  Heart - normal rate, regular rhythm, normal S1, S2, 3/6 SEM    Abdomen - soft, nontender, nondistended, no masses or organomegaly  Back exam - full range of motion, no tenderness  Neurological - cranial nerves II through XII grossly intact, no focal deficit  Musculoskeletal - no muscular tenderness noted, normal strength  Extremities - peripheral pulses diminished, right groin with ecchymosis extending to knee on anterior and posterior thigh, no bruit, 1+ LE edema in right leg   Skin - normal coloration  no rashes    12 lead ECG: NSR with RBBB, non-specific ST-T wave changes     Recent Labs:  Lab Results   Component Value Date/Time    Cholesterol, total 90 10/16/2016 03:36 AM    HDL Cholesterol 46 10/16/2016 03:36 AM     LDL, calculated 18.2 10/16/2016 03:36 AM    Triglyceride 129 10/16/2016 03:36 AM    CHOL/HDL Ratio 2.0 10/16/2016 03:36 AM     Lab Results   Component Value Date/Time    Creatinine 1.09 10/20/2016 02:51 PM     Lab Results   Component Value Date/Time    BUN 17 10/20/2016 02:51 PM     Lab Results   Component Value Date/Time    Potassium 4.6 10/20/2016 02:51 PM     No results found for: HBA1C, HGBE8, HBA1CEXT, HBA1CEXT  Lab Results   Component Value Date/Time    HGB 9.2 10/21/2016 02:07 PM     Lab Results   Component Value Date/Time    PLATELET 185 10/21/2016 06:40 AM       Reviewed:  Past Medical History:   Diagnosis Date   ??? Acute MI     x3   ??? Arthritis    ??? CAD (coronary artery disease)    ??? Cervical spondylolysis    ??? Chronic pain    ??? COPD    ??? Diabetes (HCC)    ??? GERD (gastroesophageal reflux disease)    ??? Goiter 04/20/2012   ??? Hypertension    ??? Neuropathy 04/06/2013     History   Smoking Status   ??? Current Every Day Smoker   ??? Packs/day: 0.30   ??? Years: 45.00   ??? Types: Cigarettes   Smokeless Tobacco   ??? Never Used     Comment: passed ready     History   Alcohol Use   ??? Yes     Comment: may be 3 times a year if that     Allergies   Allergen Reactions   ??? Morphine Anaphylaxis     Tolerates oxycodone without problem   ???  Avalide [Irbesartan-Hydrochlorothiazide] Myalgia   ??? Betadine [Povidone-Iodine] Rash   ??? Demerol [Meperidine] Other (comments)     Makes her feel crazy   ??? Pcn [Penicillins] Swelling   ??? Prinivil [Lisinopril] Cough   ??? Statins-Hmg-Coa Reductase Inhibitors Myalgia   ??? Sulfa (Sulfonamide Antibiotics) Unknown (comments)   ??? Tricor [Fenofibrate Micronized] Myalgia       Current Outpatient Prescriptions   Medication Sig   ??? carvedilol (COREG) 12.5 mg tablet TAKE 1 TAB BY MOUTH TWO (2) TIMES DAILY (WITH MEALS).   ??? nabumetone (RELAFEN) 500 mg tablet TAKE 1 TAB BY MOUTH TWO (2) TIMES A DAY.   ??? gabapentin (NEURONTIN) 300 mg capsule TAKE 3 CAPSULES BY MOUTH 3 TIMES A DAY    ??? ferrous sulfate (IRON) 325 mg (65 mg iron) EC tablet Take 1 Tab by mouth daily.   ??? gabapentin (NEURONTIN) 300 mg capsule Take 300 mg by mouth daily.   ??? gabapentin (NEURONTIN) 300 mg capsule Take 1,200 mg by mouth nightly.   ??? metFORMIN (GLUMETZA ER) 500 mg TG24 24 hour tablet Take 1,000 mg by mouth two (2) times a day.   ??? acetaminophen (ACETAMINOPHEN EXTRA STRENGTH) 500 mg tablet Take 1,000 mg by mouth every six (6) hours. With tramadol   ??? losartan (COZAAR) 25 mg tablet Take 1 Tab by mouth daily.   ??? pravastatin (PRAVACHOL) 40 mg tablet Take 1 Tab by mouth nightly.   ??? carvedilol (COREG) 25 mg tablet Take 1 Tab by mouth two (2) times a day.   ??? nitroglycerin (NITROSTAT) 0.4 mg SL tablet 1 Tab by SubLINGual route every five (5) minutes as needed for Chest Pain for up to 3 doses.   ??? nabumetone (RELAFEN) 500 mg tablet Take 500 mg by mouth two (2) times a day.   ??? clopidogrel (PLAVIX) 75 mg tab Take 1 Tab by mouth daily for 360 days.   ??? methocarbamol (ROBAXIN) 500 mg tablet Take 1,000 mg by mouth two (2) times daily as needed. Patient take by mouth as needed   ??? Cholecalciferol, Vitamin D3, (VITAMIN D3) 2,000 unit cap capsule One capsule by mouth daily   ??? aspirin delayed-release 81 mg tablet Take 1 Tab by mouth daily.   ??? insulin detemir (LEVEMIR FLEXTOUCH) 100 unit/mL (3 mL) inpn 38 Units by SubCUTAneous route nightly.   ??? VESICARE 10 mg tablet TAKE 1 TAB BY MOUTH DAILY.   ??? ezetimibe (ZETIA) 10 mg tablet TAKE 1 TAB BY MOUTH NIGHTLY.   ??? DULoxetine (CYMBALTA) 60 mg capsule TAKE 1 CAPSULE BY MOUTH DAILY   ??? pantoprazole (PROTONIX) 40 mg tablet TAKE 1 TABLET BY MOUTH DAILY   ??? bumetanide (BUMEX) 2 mg tablet Take 2 mg by mouth daily.     No current facility-administered medications for this visit.        Marcellus Scott, MD  [facility]   [address]  [place], [place] [address]  [phone]  h

## 2016-11-27 NOTE — Progress Notes (Signed)
To followup on studies, Carotid to be repeated for progression in 3/17  Needs aortic ultrasound for mural thrombus and atherosclerosis  ABI at next visit.   Discussed with Dr. Cherly HensenPahle.

## 2016-12-04 ENCOUNTER — Encounter: Attending: Internal Medicine | Primary: Internal Medicine

## 2016-12-05 ENCOUNTER — Encounter

## 2016-12-05 MED ORDER — LOSARTAN 25 MG TAB
25 mg | ORAL_TABLET | ORAL | 1 refills | Status: DC
Start: 2016-12-05 — End: 2017-01-26

## 2016-12-09 MED ORDER — CHANTIX CONTINUING MONTH BOX 1 MG TABLET
1 mg | ORAL_TABLET | ORAL | 0 refills | Status: DC
Start: 2016-12-09 — End: 2017-01-09

## 2016-12-29 ENCOUNTER — Encounter

## 2016-12-29 MED ORDER — NABUMETONE 500 MG TAB
500 mg | ORAL_TABLET | ORAL | 1 refills | Status: DC
Start: 2016-12-29 — End: 2017-03-18

## 2016-12-29 MED ORDER — DULOXETINE 60 MG CAP, DELAYED RELEASE
60 mg | ORAL_CAPSULE | ORAL | 2 refills | Status: DC
Start: 2016-12-29 — End: 2017-09-24

## 2017-01-08 ENCOUNTER — Ambulatory Visit: Admit: 2017-01-08 | Discharge: 2017-01-08 | Payer: MEDICARE | Attending: Internal Medicine | Primary: Internal Medicine

## 2017-01-08 DIAGNOSIS — E782 Mixed hyperlipidemia: Secondary | ICD-10-CM

## 2017-01-08 LAB — AMB POC URINALYSIS DIP STICK MANUAL W/O MICRO
Bilirubin (UA POC): NEGATIVE
Blood (UA POC): NEGATIVE
Glucose (UA POC): NEGATIVE
Ketones (UA POC): NEGATIVE
Leukocyte esterase (UA POC): NEGATIVE
Nitrites (UA POC): NEGATIVE
Specific gravity (UA POC): 1.015 (ref 1.001–1.035)
Urobilinogen (UA POC): 0.2 (ref 0.2–1)
pH (UA POC): 5 (ref 4.6–8.0)

## 2017-01-08 LAB — AMB POC COMPLETE CBC,AUTOMATED ENTER
ABS. GRANS (POC): 6.9 10*3/uL — AB (ref 1.4–6.5)
ABS. LYMPHS (POC): 2.2 10*3/uL (ref 1.2–3.4)
ABS. MONOS (POC): 0.6 10*3/uL (ref 0.1–0.6)
GRANULOCYTES (POC): 71.1 % (ref 42.2–75.2)
HCT (POC): 41 % (ref 35.0–60.0)
HGB (POC): 13.6 g/dL (ref 11–18)
LYMPHOCYTES (POC): 22.6 % (ref 20.5–51.1)
MCH (POC): 29 pg (ref 27.0–31.0)
MCHC (POC): 33.2 g/dL (ref 33.0–37.0)
MCV (POC): 87.2 fL (ref 80.0–99.9)
MONOCYTES (POC): 6.3 % (ref 1.7–9.3)
MPV (POC): 7.2 fL — AB (ref 7.8–11)
PLATELET (POC): 238 10*3/uL (ref 150–450)
RBC (POC): 4.7 M/uL (ref 4.00–6.00)
RDW (POC): 15.2 % — AB (ref 11.6–13.7)
WBC (POC): 9.7 10*3/uL (ref 4.5–10.5)

## 2017-01-08 LAB — AMB POC LIPID PROFILE
Cholesterol (POC): 134 mg/dL (ref 100–199)
HDL Cholesterol (POC): 56 mg/dL (ref 35–150)
LDL Cholesterol (POC): 38 mg/dL (ref 0–129)
Non-HDL Goal (POC): 77
TChol/HDL Ratio (POC): 2.4 CALC (ref 0.0–5.0)
Triglycerides (POC): 194 mg/dL — AB (ref 0–150)

## 2017-01-08 LAB — AMB POC HEMOGLOBIN A1C: Hemoglobin A1c (POC): 5.9 % — AB (ref 4.8–5.6)

## 2017-01-08 MED ORDER — TRAMADOL 50 MG TAB
50 mg | ORAL_TABLET | Freq: Three times a day (TID) | ORAL | 0 refills | Status: AC | PRN
Start: 2017-01-08 — End: 2017-03-09

## 2017-01-08 NOTE — Progress Notes (Signed)
History and Physical    Kelsey [name] is a 66 y.o. female presents for physical exam.  C/o dry mouth.  Wears hearing aids.- sometimes but after a sinus infection this Fall and treatment, hearing improved.    Past Medical History:   Diagnosis Date   ??? Acute MI     x3   ??? Arthritis    ??? CAD (coronary artery disease)    ??? Cervical spondylolysis    ??? Chronic pain    ??? COPD    ??? Diabetes ([place])    ??? GERD (gastroesophageal reflux disease)    ??? Goiter 04/20/2012   ??? Hypertension    ??? Neuropathy 04/06/2013     Past Surgical History:   Procedure Laterality Date   ??? 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 CARDIAC SURG PROCEDURE UNLIST      PTCA   ??? HX KNEE ARTHROSCOPY Right     X2   ??? HX OTHER SURGICAL  1970    GANGLION CYST   ??? HX TONSILLECTOMY  1963     Current Outpatient Prescriptions   Medication Sig Dispense Refill   ??? traMADol (ULTRAM) 50 mg tablet Take 1 Tab by mouth every eight (8) hours as needed for Pain for up to 60 days. Max Daily Amount: 150 mg. 180 Tab 0   ??? nabumetone (RELAFEN) 500 mg tablet TAKE 1 TAB BY MOUTH TWO (2) TIMES A DAY. 60 Tab 1   ??? DULoxetine (CYMBALTA) 60 mg capsule TAKE 1 CAPSULE BY MOUTH DAILY 90 Cap 2   ??? CHANTIX CONTINUING MONTH BOX 1 mg tablet TAKE 1 TABLET BY MOUTH TWICE A DAY 56 Tab 0   ??? losartan (COZAAR) 25 mg tablet TAKE 1 TAB BY MOUTH DAILY. 30 Tab 1   ??? gabapentin (NEURONTIN) 300 mg capsule TAKE 3 CAPSULES BY MOUTH 3 TIMES A DAY 810 Cap 0   ??? ferrous sulfate (IRON) 325 mg (65 mg iron) EC tablet Take 1 Tab by mouth daily. 90 Tab 3   ??? gabapentin (NEURONTIN) 300 mg capsule Take 300 mg by mouth daily.     ??? gabapentin (NEURONTIN) 300 mg capsule Take 1,200 mg by mouth nightly.     ??? metFORMIN (GLUMETZA ER) 500 mg TG24 24 hour tablet Take 1,000 mg by mouth two (2) times a day.     ??? acetaminophen (ACETAMINOPHEN EXTRA STRENGTH) 500 mg tablet Take 1,000 mg by mouth every six (6) hours. With tramadol     ??? pravastatin (PRAVACHOL) 40 mg tablet Take 1 Tab by mouth nightly. 90 Tab 4    ??? carvedilol (COREG) 25 mg tablet Take 1 Tab by mouth two (2) times a day. 180 Tab 3   ??? nitroglycerin (NITROSTAT) 0.4 mg SL tablet 1 Tab by SubLINGual route every five (5) minutes as needed for Chest Pain for up to 3 doses. 1 Bottle 1   ??? nabumetone (RELAFEN) 500 mg tablet Take 500 mg by mouth two (2) times a day.     ??? clopidogrel (PLAVIX) 75 mg tab Take 1 Tab by mouth daily for 360 days. 30 Tab 11   ??? methocarbamol (ROBAXIN) 500 mg tablet Take 1,000 mg by mouth two (2) times daily as needed. Patient take by mouth as needed     ??? Cholecalciferol, Vitamin D3, (VITAMIN D3) 2,000 unit cap capsule One capsule by mouth daily 90 Cap 3   ??? aspirin delayed-release 81 mg tablet Take 1 Tab by mouth  daily. 90 Tab 3   ??? insulin detemir (LEVEMIR FLEXTOUCH) 100 unit/mL (3 mL) inpn 38 Units by SubCUTAneous route nightly.     ??? VESICARE 10 mg tablet TAKE 1 TAB BY MOUTH DAILY. 90 Tab 4   ??? ezetimibe (ZETIA) 10 mg tablet TAKE 1 TAB BY MOUTH NIGHTLY. 90 Tab 3   ??? pantoprazole (PROTONIX) 40 mg tablet TAKE 1 TABLET BY MOUTH DAILY 90 Tab 3   ??? bumetanide (BUMEX) 2 mg tablet Take 2 mg by mouth daily.       Allergies   Allergen Reactions   ??? Morphine Anaphylaxis     Tolerates oxycodone without problem   ??? Avalide [Irbesartan-Hydrochlorothiazide] Myalgia   ??? Betadine [Povidone-Iodine] Rash   ??? Demerol [Meperidine] Other (comments)     Makes her feel crazy   ??? Pcn [Penicillins] Swelling   ??? Prinivil [Lisinopril] Cough   ??? Statins-Hmg-Coa Reductase Inhibitors Myalgia   ??? Sulfa (Sulfonamide Antibiotics) Unknown (comments)   ??? Tricor [Fenofibrate Micronized] Myalgia     Social History     Social History   ??? Marital status: MARRIED     Spouse name: N/A   ??? Number of children: N/A   ??? Years of education: N/A     Social History Main Topics   ??? Smoking status: Current Every Day Smoker     Packs/day: 0.30     Years: 45.00     Types: Cigarettes   ??? Smokeless tobacco: Never Used      Comment: passed ready   ??? Alcohol use Yes       Comment: may be 3 times a year if that   ??? Drug use: No   ??? Sexual activity: Not Asked     Other Topics Concern   ??? None     Social History Narrative     Family History   Problem Relation Age of Onset   ??? Lung Disease Mother    ??? Stroke Father    ??? Lung Disease Brother      COPD   ??? Hypertension Brother    ??? Cancer Brother      PROSTATE   ??? Heart Disease Brother    ??? Diabetes Brother    ??? Anesth Problems Neg Hx      The patient does not have a history of falls. A plan of care for falls was documented..  Depression screen pos- treated.    Review of Systems:  Denies dysphagia, chest pain, or SOB.  No nausea or vomiting, or diarrhea or constipation.  No fever, chills, night sweats, or cough.  No dysuria, frequency or abdominal pain.  No headaches.  Mammogram 11/2014- fine,  Bone density normal in 2015  Colonoscopy  07/2016- follow up 5 yrs.  Carotid dopplers 7/17-  50-79 % narrowing on right <50% on left.      Yearly eye exam:   yes  Yearly dental exam: no    Objective  Visit Vitals   ??? BP 120/70   ??? Ht '5\' 6"'  (1.676 m)   ??? Wt 205 lb (93 kg)   ??? BMI 33.09 kg/m2     General appearance - well developed, well nourished 66 y.o. wf  Eyes - PERRL  Tm- intact  Pharynx- dry mucosa  Neck --Supple, no anterior cervical nodes, no thyromegaly. Rt carotid bruit,    Lungs --Coarse breath sounds  Heart - Regular rate and rhythm  2/6 asm  Abdomen - soft, no tenderness or distention  Breasts - fibrous areas bilat, no axillary nodes    Rectal -- hem. negative, brown stool  Extremities - no clubbing, cyanosis or edema  Poor pulses    Results for orders placed or performed in visit on 01/08/17   AMB POC URINALYSIS DIP STICK MANUAL W/O MICRO   Result Value Ref Range    Color (UA POC) Yellow     Clarity (UA POC) Clear     Glucose (UA POC) Negative Negative    Bilirubin (UA POC) Negative Negative    Ketones (UA POC) Negative Negative    Specific gravity (UA POC) 1.015 1.001 - 1.035    Blood (UA POC) Negative Negative     pH (UA POC) 5.0 4.6 - 8.0    Protein (UA POC) 1+ Negative    Urobilinogen (UA POC) 0.2 mg/dL 0.2 - 1    Nitrites (UA POC) Negative Negative    Leukocyte esterase (UA POC) Negative Negative   AMB POC HEMOGLOBIN A1C   Result Value Ref Range    Hemoglobin A1c (POC) 5.9 (A) 4.8 - 5.6 %   AMB POC LIPID PROFILE   Result Value Ref Range    Cholesterol (POC) 134 100 - 199 mg/dL    Triglycerides (POC) 194 (A) 0 - 150 mg/dL    HDL Cholesterol (POC) 56 35 - 150 mg/dL    LDL Cholesterol (POC) 38 0 - 129 mg/dL    Non-HDL Goal (POC) 77     TChol/HDL Ratio (POC) 2.4 0.0 - 5.0 CALC   AMB POC COMPLETE CBC,AUTOMATED ENTER   Result Value Ref Range    WBC (POC) 9.7 4.5 - 10.5 K/uL    LYMPHOCYTES (POC) 22.6 20.5 - 51.1 %    MONOCYTES (POC) 6.3 1.7 - 9.3 %    GRANULOCYTES (POC) 71.1 42.2 - 75.2 %    ABS. LYMPHS (POC) 2.2 1.2 - 3.4 K/uL    ABS. MONOS (POC) 0.6 0.1 - 0.6 10^3/ul    ABS. GRANS (POC) 6.9 (A) 1.4 - 6.5 10^3/ul    RBC (POC) 4.70 4.00 - 6.00 M/uL    HGB (POC) 13.6 11 - 18 g/dL    HCT (POC) 41.0 35.0 - 60.0 %    MCV (POC) 87.2 80.0 - 99.9 fL    MCH (POC) 29.0 27.0 - 31.0 pg    MCHC (POC) 33.2 33.0 - 37.0 g/dL    RDW (POC) 15.2 (A) 11.6 - 13.7 %    PLATELET (POC) 238 150 - 450 K/uL    MPV (POC) 7.2 (A) 7.8 - 11 fL         Assessment/Plan      ICD-10-CM ICD-9-CM    1. Mixed hyperlipidemia E78.2 272.2 AMB POC LIPID PROFILE   2. Uncontrolled type 2 diabetes mellitus with other diabetic arthropathy, with long-term current use of insulin (HCC) E11.618 250.82 AMB POC URINALYSIS DIP STICK MANUAL W/O MICRO    Z79.4 716.90 AMB POC HEMOGLOBIN A1C    E11.65 V58.67 PR COLLECTION VENOUS BLOOD,VENIPUNCTURE   3. Long term (current) use of insulin (HCC) Z79.4 V58.67    4. Hypertension complicating diabetes ([place]) W10.93 235.57 METABOLIC PANEL, COMPREHENSIVE    I10 401.9    5. 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 Type 2 diabetes mellitus with diabetic neuropathy, with long-term  current use of insulin (HCC) E11.40 250.60     Z79.4 357.2      V58.67  7. Atherosclerosis of native coronary artery of native heart without angina pectoris I25.10 414.01    8. Goiter E04.9 240.9 TSH 3RD GENERATION   9. Dyspnea on exertion R06.09 786.09 AMB POC COMPLETE CBC,AUTOMATED ENTER   10. Screening breast examination Z12.31 V76.10 MAM MAMMO BI SCREENING INCL CAD   32. Cough R05 786.2 CT CHEST W CONT   12. Encounter for screening for lung cancer Z12.2 V76.0 CT CHEST W CONT   ask Dr. Marlon Pel about aortic studies and dopplers.  Mammogram, set up chest ct  Ok to take 3 tramadol daily until hip replacement  Then taper off.      AUTHOR:  Genene Churn, MD 01/08/2017 2:25 PM

## 2017-01-08 NOTE — Patient Instructions (Addendum)
Results for orders placed or performed in visit on 01/08/17   AMB POC URINALYSIS DIP STICK MANUAL W/O MICRO   Result Value Ref Range    Color (UA POC) Yellow     Clarity (UA POC) Clear     Glucose (UA POC) Negative Negative    Bilirubin (UA POC) Negative Negative    Ketones (UA POC) Negative Negative    Specific gravity (UA POC) 1.015 1.001 - 1.035    Blood (UA POC) Negative Negative    pH (UA POC) 5.0 4.6 - 8.0    Protein (UA POC) 1+ Negative    Urobilinogen (UA POC) 0.2 mg/dL 0.2 - 1    Nitrites (UA POC) Negative Negative    Leukocyte esterase (UA POC) Negative Negative   AMB POC HEMOGLOBIN A1C   Result Value Ref Range    Hemoglobin A1c (POC) 5.9 (A) 4.8 - 5.6 %   AMB POC LIPID PROFILE   Result Value Ref Range    Cholesterol (POC) 134 100 - 199 mg/dL    Triglycerides (POC) 194 (A) 0 - 150 mg/dL    HDL Cholesterol (POC) 56 35 - 150 mg/dL    LDL Cholesterol (POC) 38 0 - 129 mg/dL    Non-HDL Goal (POC) 77     TChol/HDL Ratio (POC) 2.4 0.0 - 5.0 CALC   AMB POC COMPLETE CBC,AUTOMATED ENTER   Result Value Ref Range    WBC (POC) 9.7 4.5 - 10.5 K/uL    LYMPHOCYTES (POC) 22.6 20.5 - 51.1 %    MONOCYTES (POC) 6.3 1.7 - 9.3 %    GRANULOCYTES (POC) 71.1 42.2 - 75.2 %    ABS. LYMPHS (POC) 2.2 1.2 - 3.4 K/uL    ABS. MONOS (POC) 0.6 0.1 - 0.6 10^3/ul    ABS. GRANS (POC) 6.9 (A) 1.4 - 6.5 10^3/ul    RBC (POC) 4.70 4.00 - 6.00 M/uL    HGB (POC) 13.6 11 - 18 g/dL    HCT (POC) 81.141.0 91.435.0 - 60.0 %    MCV (POC) 87.2 80.0 - 99.9 fL    MCH (POC) 29.0 27.0 - 31.0 pg    MCHC (POC) 33.2 33.0 - 37.0 g/dL    RDW (POC) 78.215.2 (A) 95.611.6 - 13.7 %    PLATELET (POC) 238 150 - 450 K/uL    MPV (POC) 7.2 (A) 7.8 - 11 fL     Mammogram  Lung ct to screen for lung ca

## 2017-01-08 NOTE — Telephone Encounter (Signed)
Dr.Pahley calling stating she would like to speak with Dr.Browning directly regarding this patient. She also states it is not an emergency. Dr. Edwyna ReadyPahley can be reached at [phone].     Thanks !

## 2017-01-09 LAB — METABOLIC PANEL, COMPREHENSIVE
A-G Ratio: 2 (ref 1.2–2.2)
ALT (SGPT): 14 IU/L (ref 0–32)
AST (SGOT): 14 IU/L (ref 0–40)
Albumin: 4.2 g/dL (ref 3.6–4.8)
Alk. phosphatase: 66 IU/L (ref 39–117)
BUN/Creatinine ratio: 17 (ref 12–28)
BUN: 17 mg/dL (ref 8–27)
Bilirubin, total: 0.2 mg/dL (ref 0.0–1.2)
CO2: 27 mmol/L (ref 18–29)
Calcium: 9.3 mg/dL (ref 8.7–10.3)
Chloride: 98 mmol/L (ref 96–106)
Creatinine: 1 mg/dL (ref 0.57–1.00)
GFR est AA: 68 mL/min/{1.73_m2} (ref >59)
GFR est non-AA: 59 mL/min/{1.73_m2} — ABNORMAL LOW (ref >59)
GLOBULIN, TOTAL: 2.1 g/dL (ref 1.5–4.5)
Glucose: 109 mg/dL — ABNORMAL HIGH (ref 65–99)
Potassium: 4.5 mmol/L (ref 3.5–5.2)
Protein, total: 6.3 g/dL (ref 6.0–8.5)
Sodium: 141 mmol/L (ref 134–144)

## 2017-01-09 LAB — TSH 3RD GENERATION: TSH: 2.81 u[IU]/mL (ref 0.450–4.500)

## 2017-01-09 MED ORDER — PANTOPRAZOLE 40 MG TAB, DELAYED RELEASE
40 mg | ORAL_TABLET | ORAL | 3 refills | Status: DC
Start: 2017-01-09 — End: 2018-01-26

## 2017-01-09 MED ORDER — CHANTIX CONTINUING MONTH BOX 1 MG TABLET
1 mg | ORAL_TABLET | ORAL | 0 refills | Status: DC
Start: 2017-01-09 — End: 2018-02-02

## 2017-01-26 ENCOUNTER — Encounter

## 2017-01-26 MED ORDER — LOSARTAN 25 MG TAB
25 mg | ORAL_TABLET | ORAL | 1 refills | Status: DC
Start: 2017-01-26 — End: 2017-03-24

## 2017-01-26 NOTE — Progress Notes (Signed)
Left 3 msgs (1/26, 1/29, 2/5)  for pt to call to sch aortic ultrasound per Dr. Dahlia ClientBrowning.  Mailed letter 01/26/17.

## 2017-02-03 ENCOUNTER — Inpatient Hospital Stay: Admit: 2017-02-03 | Payer: MEDICARE | Attending: Internal Medicine | Primary: Internal Medicine

## 2017-02-03 ENCOUNTER — Encounter

## 2017-02-03 ENCOUNTER — Ambulatory Visit

## 2017-02-03 DIAGNOSIS — Z1231 Encounter for screening mammogram for malignant neoplasm of breast: Secondary | ICD-10-CM

## 2017-02-03 DIAGNOSIS — Z87891 Personal history of nicotine dependence: Secondary | ICD-10-CM

## 2017-02-03 NOTE — Progress Notes (Signed)
I called with result.  Follow up ct 6 months.- or july

## 2017-02-03 NOTE — Telephone Encounter (Signed)
pls call and put order in

## 2017-02-03 NOTE — Telephone Encounter (Signed)
They rescheduled her for 2:30 pm today,  They need you to put the orders in, will not except me putting them in because I am not license as [employer]

## 2017-02-03 NOTE — Progress Notes (Signed)
I called with results

## 2017-02-03 NOTE — Telephone Encounter (Signed)
Called left message for dr to call, asap

## 2017-02-03 NOTE — Telephone Encounter (Signed)
Stacy from [facility] said patient is there now they have about 20 minutes with her Mammogram then she will be leaving, they need a new order put in connect care needs to say   Low Dose Lung Screening for CT DX needs to say patient is a smoker. That way medicare will pay for it.    Kennyth ArnoldStacy Ph# [phone]

## 2017-02-06 ENCOUNTER — Institutional Professional Consult (permissible substitution): Admit: 2017-02-06 | Discharge: 2017-02-06 | Payer: MEDICARE | Primary: Internal Medicine

## 2017-02-06 DIAGNOSIS — I700 Atherosclerosis of aorta: Secondary | ICD-10-CM

## 2017-02-06 DIAGNOSIS — I739 Peripheral vascular disease, unspecified: Secondary | ICD-10-CM

## 2017-02-06 NOTE — Telephone Encounter (Signed)
She can hold her plavix x 5 days for surgery.  Should continue ASA 81mg  peri-op.  She is intermediate risk for cardiovascular complications during a non-cardiac surgery due to her hx of CAD.

## 2017-02-06 NOTE — Progress Notes (Signed)
Results in [facility]

## 2017-02-06 NOTE — Telephone Encounter (Signed)
Returned patient's call, 2 pt identifiers used  Advised patient clearance letter has been sent to Dr. Trude McburneyAnthony Shaia and she may hold Plavix 5 days prior but should continue ASA is possible.

## 2017-02-06 NOTE — Progress Notes (Signed)
In [employer]

## 2017-02-06 NOTE — Telephone Encounter (Signed)
Pt needing a clearance to stop Plavix so she can have hip surgery. The clearance can be faxed to [phone] or can be reached by phone [phone]. The pt can be reached (if needed) at [phone]. [employer]~

## 2017-02-10 NOTE — Procedures (Signed)
[facility]  *** FINAL REPORT ***    Name: Kelsey [name], Kelsey [name]  MRN: [id number]       Outpatient  DOB: 09 Dec 1951  HIS Order #: [id number]  [facility] Visit #: [id number]  Date: 06 Feb 2017    TYPE OF TEST: Peripheral Arterial Testing    REASON FOR TEST  Peripheral vascular disease    Right Leg  Segmentals: Abnormal                     mmHg  Brachial         143  High thigh  Low thigh  Calf  Posterior tibial  90  Dorsalis pedis    90  Peroneal  Metatarsal  Toe pressure  Doppler:    Abnormal  PVR:        Abnormal  Ankle/Brachial: 0.61    Left Leg  Segmentals: Abnormal                     mmHg  Brachial         148  High thigh  Low thigh  Calf  Posterior tibial 102  Dorsalis pedis    92  Peroneal  Metatarsal  Toe pressure  Doppler:    Abnormal  PVR:        Abnormal  Ankle/Brachial: 0.69    INTERPRETATION/FINDINGS  PROCEDURE:  Evaluation of lower extremity arteries with systolic blood   pressure measurement at the ankle and brachial level with calculation   of the ankle/brachial pressure index (ABI).  the exam may also  include pulse volume recording (PVR) plethysmography at the ankle  level.    FINDINGS:  Abnormal monophasic Doppler waveforms are exhibited within  the distal posterior tibial and dorsalis pedis arteries bilaterally.  PVR tracings at the ankle and metatarsal levels appear damped.  Resting ankle/brachial indices are moderately reduced at 0.61 on the  right and 0.69 on the left.    IMPRESSION:  1)  Significant peripheral vascular disease is present in the  bilateral lower extremity.  2)  The right ankle/brachial index is moderately reduced at 0.61.  3)  The left ankle/brachial index is moderately reduced at 0.69.  4)  In comparison with a study performed at [facility] on 12/07/2014,  ankle/brachial indices have decreased bilaterally.  ABIs previously  measured 0.82 on the right and 0.88 on the left.    ADDITIONAL COMMENTS    I have personally reviewed the data relevant to the interpretation  of  this  study.    TECHNOLOGIST: Laural Roesarol Miranda, RVT, RDMS, RDCS  Signed: 02/06/2017 09:45 AM    PHYSICIAN: Caleen Jobshristine M. Dahlia ClientBrowning, MD  Signed: 02/10/2017 01:33 PM

## 2017-02-10 NOTE — Procedures (Signed)
[facility]  *** FINAL REPORT ***    Name: Kelsey [name], Kelsey [name]  MRN: [id number]       Outpatient  DOB: 09 Dec 1951  HIS Order #: [id number]  TRAK[facility] Visit #: [id number]  Date: 06 Feb 2017    TYPE OF TEST: Aorto-Iliac Duplex    REASON FOR TEST  Mural thrombus and atherosclerotic disease    B-Mode:-                 (cm)   1     2     3   Aortic diameter:         AP:     2.2   1.9   2.0                           TV:     2.1   1.9   1.9  Common iliac diameter:   Right: 0.80                           Left:  0.80    Duplex:-                           PSV  Stenosis                           ----- --------------------  Aorta: (1)                68.0 Normal         (2)                57.0         (3)                58.0    Right common iliac:      103.0 Normal  Right external iliac:    Left common iliac:       133.0 > 50% stenosis  Left external iliac:    INTERPRETATION/FINDINGS  PROCEDURE:  Grayscale, color flow and spectral Doppler analysis of the   abdominal aorta and proximal common iliac arteries.    FINDINGS:  The abdominal aorta appears normal in size with no evidence   of focal dilatation.  Atherosclerosis is noted within the walls of  the aorta without Doppler evidence of significant stenosis.  The  proximal common iliac arteries appear free of aneurysmal disease.  Patent endovascular stents are noted bilaterally. Not clearly seen  ,but no evidence of significant obstruction.    IMPRESSION:  1)  No evidence of abdominal aorta aneurysm.  2)  Atherosclerotic plaque is present within the aorta.  3)  Patent bilateral common iliac artery stents.    ADDITIONAL COMMENTS    I have personally reviewed the data relevant to the interpretation of  this  study.    TECHNOLOGIST: Laural Roesarol Miranda, RVT, RDMS, RDCS  Signed: 02/06/2017 09:33 AM    PHYSICIAN: Caleen Jobshristine M. Dahlia ClientBrowning, MD  Signed: 02/10/2017 01:36 PM

## 2017-02-10 NOTE — Procedures (Signed)
[facility]  *** FINAL REPORT ***    Name: [name], Kelsey  MRN: [id number]       Outpatient  DOB: 09 Dec 1951  HIS Order #: [id number]  [facility] Visit #: [id number]  Date: 06 Feb 2017    TYPE OF TEST: Peripheral Arterial Testing    REASON FOR TEST  Peripheral vascular disease    Right Leg  Segmentals: Abnormal                     mmHg  Brachial         143  High thigh  Low thigh  Calf  Posterior tibial  90  Dorsalis pedis    90  Peroneal  Metatarsal  Toe pressure  Doppler:    Abnormal  PVR:        Abnormal  Ankle/Brachial: 0.61    Left Leg  Segmentals: Abnormal                     mmHg  Brachial         148  High thigh  Low thigh  Calf  Posterior tibial 102  Dorsalis pedis    92  Peroneal  Metatarsal  Toe pressure  Doppler:    Abnormal  PVR:        Abnormal  Ankle/Brachial: 0.69    INTERPRETATION/FINDINGS  PROCEDURE:  Evaluation of lower extremity arteries with systolic blood   pressure measurement at the ankle and brachial level with calculation   of the ankle/brachial pressure index (ABI).  the exam may also  include pulse volume recording (PVR) plethysmography at the ankle  level.    FINDINGS:  Abnormal monophasic Doppler waveforms are exhibited within  the distal posterior tibial and dorsalis pedis arteries bilaterally.  PVR tracings at the ankle and metatarsal levels appear damped.  Resting ankle/brachial indices are moderately reduced at 0.61 on the  right and 0.69 on the left.    IMPRESSION:  1)  Significant peripheral vascular disease is present in the  bilateral lower extremity.  2)  The right ankle/brachial index is moderately reduced at 0.61.  3)  The left ankle/brachial index is moderately reduced at 0.69.  4)  In comparison with a study performed at [facility] on 12/07/2014,  ankle/brachial indices have decreased bilaterally.  ABIs previously  measured 0.82 on the right and 0.88 on the left.    ADDITIONAL COMMENTS    I have personally reviewed the data relevant to the interpretation of   this  study.    TECHNOLOGIST: Laural Roesarol Miranda, RVT, RDMS, RDCS  Signed: 02/06/2017 09:45 AM    PHYSICIAN: Caleen Jobshristine M. Dahlia ClientBrowning, MD  Signed: 02/10/2017 01:33 PM

## 2017-02-10 NOTE — Telephone Encounter (Signed)
2 identifiers.  Per Dr Dahlia ClientBrowning "RABI .6/LABI .69.  No AAA. +Ao Athero. Rpt 1 year".  Patient verbalized understanding.

## 2017-02-10 NOTE — Procedures (Signed)
[facility]  *** FINAL REPORT ***    Name: [name], Kelsey  MRN: [id number]       Outpatient  DOB: 09 Dec 1951  HIS Order #: [id number]  TRAKnet Visit #: [id number]  Date: 06 Feb 2017    TYPE OF TEST: Aorto-Iliac Duplex    REASON FOR TEST  Mural thrombus and atherosclerotic disease    B-Mode:-                 (cm)   1     2     3  Aortic diameter:         AP:     2.2   1.9   2.0                           TV:     2.1   1.9   1.9  Common iliac diameter:   Right: 0.80                           Left:  0.80    Duplex:-                           PSV  Stenosis                           ----- --------------------  Aorta: (1)                68.0 Normal         (2)                57.0         (3)                58.0    Right common iliac:      103.0 Normal  Right external iliac:    Left common iliac:       133.0 > 50% stenosis  Left external iliac:    INTERPRETATION/FINDINGS  PROCEDURE:  Grayscale, color flow and spectral Doppler analysis of the   abdominal aorta and proximal common iliac arteries.    FINDINGS:  The abdominal aorta appears normal in size with no evidence   of focal dilatation.  Atherosclerosis is noted within the walls of  the aorta without Doppler evidence of significant stenosis.  The  proximal common iliac arteries appear free of aneurysmal disease.  Patent endovascular stents are noted bilaterally. Not clearly seen  ,but no evidence of significant obstruction.    IMPRESSION:  1)  No evidence of abdominal aorta aneurysm.  2)  Atherosclerotic plaque is present within the aorta.  3)  Patent bilateral common iliac artery stents.    ADDITIONAL COMMENTS    I have personally reviewed the data relevant to the interpretation of  this  study.    TECHNOLOGIST: Carol Miranda, RVT, RDMS, RDCS  Signed: 02/06/2017 09:33 AM    PHYSICIAN: Chace Klippel M. Shantia Sanford, MD  Signed: 02/10/2017 01:36 PM

## 2017-02-16 ENCOUNTER — Ambulatory Visit: Admit: 2017-02-16 | Payer: MEDICARE | Attending: Internal Medicine | Primary: Internal Medicine

## 2017-02-16 DIAGNOSIS — IMO0002 Reserved for concepts with insufficient information to code with codable children: Secondary | ICD-10-CM

## 2017-02-16 NOTE — Progress Notes (Signed)
Kelsey [name] is a 66 y.o. female and presents with   Chief Complaint   Patient presents with   ??? Pre-op Exam   .  Patient denies chest pain.  She has chronic sob.  Still smoking- about a pack a week.      Current Outpatient Prescriptions   Medication Sig Dispense Refill   ??? losartan (COZAAR) 25 mg tablet TAKE 1 TAB BY MOUTH DAILY. 30 Tab 1   ??? CHANTIX CONTINUING MONTH BOX 1 mg tablet TAKE 1 TABLET BY MOUTH TWICE A DAY 56 Tab 0   ??? pantoprazole (PROTONIX) 40 mg tablet TAKE 1 TABLET BY MOUTH DAILY 90 Tab 3   ??? traMADol (ULTRAM) 50 mg tablet Take 1 Tab by mouth every eight (8) hours as needed for Pain for up to 60 days. Max Daily Amount: 150 mg. 180 Tab 0   ??? nabumetone (RELAFEN) 500 mg tablet TAKE 1 TAB BY MOUTH TWO (2) TIMES A DAY. 60 Tab 1   ??? DULoxetine (CYMBALTA) 60 mg capsule TAKE 1 CAPSULE BY MOUTH DAILY 90 Cap 2   ??? gabapentin (NEURONTIN) 300 mg capsule TAKE 3 CAPSULES BY MOUTH 3 TIMES A DAY 810 Cap 0   ??? ferrous sulfate (IRON) 325 mg (65 mg iron) EC tablet Take 1 Tab by mouth daily. 90 Tab 3   ??? gabapentin (NEURONTIN) 300 mg capsule Take 300 mg by mouth daily.     ??? gabapentin (NEURONTIN) 300 mg capsule Take 1,200 mg by mouth nightly.     ??? metFORMIN (GLUMETZA ER) 500 mg TG24 24 hour tablet Take 1,000 mg by mouth two (2) times a day.     ??? acetaminophen (ACETAMINOPHEN EXTRA STRENGTH) 500 mg tablet Take 1,000 mg by mouth every six (6) hours. With tramadol     ??? pravastatin (PRAVACHOL) 40 mg tablet Take 1 Tab by mouth nightly. 90 Tab 4   ??? carvedilol (COREG) 25 mg tablet Take 1 Tab by mouth two (2) times a day. 180 Tab 3   ??? nitroglycerin (NITROSTAT) 0.4 mg SL tablet 1 Tab by SubLINGual route every five (5) minutes as needed for Chest Pain for up to 3 doses. 1 Bottle 1   ??? nabumetone (RELAFEN) 500 mg tablet Take 500 mg by mouth two (2) times a day.     ??? clopidogrel (PLAVIX) 75 mg tab Take 1 Tab by mouth daily for 360 days. 30 Tab 11   ??? methocarbamol (ROBAXIN) 500 mg tablet Take 1,000 mg by mouth two (2)  times daily as needed. Patient take by mouth as needed     ??? Cholecalciferol, Vitamin D3, (VITAMIN D3) 2,000 unit cap capsule One capsule by mouth daily 90 Cap 3   ??? aspirin delayed-release 81 mg tablet Take 1 Tab by mouth daily. 90 Tab 3   ??? insulin detemir (LEVEMIR FLEXTOUCH) 100 unit/mL (3 mL) inpn 38 Units by SubCUTAneous route nightly.     ??? VESICARE 10 mg tablet TAKE 1 TAB BY MOUTH DAILY. 90 Tab 4   ??? ezetimibe (ZETIA) 10 mg tablet TAKE 1 TAB BY MOUTH NIGHTLY. 90 Tab 3   ??? bumetanide (BUMEX) 2 mg tablet Take 2 mg by mouth daily.       Allergies   Allergen Reactions   ??? Morphine Anaphylaxis     Tolerates oxycodone without problem   ??? Avalide [Irbesartan-Hydrochlorothiazide] Myalgia   ??? Betadine [Povidone-Iodine] Rash   ??? Demerol [Meperidine] Other (comments)     Makes her feel crazy   ???  Pcn [Penicillins] Swelling   ??? Prinivil [Lisinopril] Cough   ??? Statins-Hmg-Coa Reductase Inhibitors Myalgia   ??? Sulfa (Sulfonamide Antibiotics) Unknown (comments)   ??? Tricor [Fenofibrate Micronized] Myalgia     Past Medical History:   Diagnosis Date   ??? Acute MI     x3   ??? Arthritis    ??? CAD (coronary artery disease)    ??? Cervical spondylolysis    ??? Chronic pain    ??? COPD    ??? Diabetes ([place])    ??? GERD (gastroesophageal reflux disease)    ??? Goiter 04/20/2012   ??? Hypertension    ??? Neuropathy 04/06/2013     Past Surgical History:   Procedure Laterality Date   ??? 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 CARDIAC SURG PROCEDURE UNLIST      PTCA   ??? HX KNEE ARTHROSCOPY Right     X2   ??? HX OTHER SURGICAL  1970    GANGLION CYST   ??? HX TONSILLECTOMY  1963     Family History   Problem Relation Age of Onset   ??? Lung Disease Mother 3   ??? Stroke Father    ??? Lung Disease Brother 61     COPD   ??? Hypertension Brother    ??? Cancer Brother      PROSTATE   ??? Heart Disease Brother    ??? Diabetes Brother    ??? No Known Problems Paternal Grandmother    ??? Anesth Problems Neg Hx      Social History   Substance Use Topics    ??? Smoking status: Current Every Day Smoker     Packs/day: 0.30     Years: 45.00     Types: Cigarettes   ??? Smokeless tobacco: Never Used      Comment: passed ready   ??? Alcohol use Yes      Comment: may be 3 times a year if that      The patient has a history of falls. A plan of care for falls was documented..  Depression screen pos- treated      Objective:  Visit Vitals   ??? BP 130/70   ??? Ht '5\' 6"'  (1.676 m)   ??? Wt 204 lb (92.5 kg)   ??? BMI 32.93 kg/m2     wdwn 66 yo wf  In NAD. A&O.  HEENT -- Pupils round.  O/P Clear.  Neck -- Supple. No JVD.  Heart -- RRR. 2/6 asm  Lungs -- Coarse breath sounds  Abdomen -- Soft. Non-tender. Non-distended. No masses. Bowel sounds present.  Extremities -- No edema. Unable to palpate pulses        Assessment/Plan:    ICD-10-CM ICD-9-CM    1. 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 Long term (current) use of insulin (HCC) Z79.4 V58.67    3. Hypertension complicating diabetes ([place]) E11.59 250.80     I10 401.9    4. 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

## 2017-02-20 MED ORDER — CHANTIX CONTINUING MONTH BOX 1 MG TABLET
1 mg | ORAL_TABLET | ORAL | 3 refills | Status: DC
Start: 2017-02-20 — End: 2017-07-04

## 2017-03-02 NOTE — Telephone Encounter (Signed)
Returned call to patient. Verified identity. Scheduled patient 06/16/17 at 11 am per patient request.

## 2017-03-02 NOTE — Telephone Encounter (Signed)
Patient called in and had to cancel her upcoming appt for her 3 month follow up. She needs to reschedule this appointment but does not want to wait until June. Thanks!  Phone [phone]  Kelsey CornfieldStephanie

## 2017-03-12 ENCOUNTER — Encounter: Primary: Internal Medicine

## 2017-03-12 ENCOUNTER — Encounter: Attending: Internal Medicine | Primary: Internal Medicine

## 2017-03-18 ENCOUNTER — Encounter

## 2017-03-18 MED ORDER — NABUMETONE 500 MG TAB
500 mg | ORAL_TABLET | ORAL | 1 refills | Status: DC
Start: 2017-03-18 — End: 2017-05-21

## 2017-03-21 MED ORDER — LEVEMIR FLEXTOUCH U-100 INSULIN 100 UNIT/ML (3 ML) SUBCUTANEOUS PEN
100 unit/mL (3 mL) | SUBCUTANEOUS | 2 refills | Status: DC
Start: 2017-03-21 — End: 2018-03-10

## 2017-03-24 ENCOUNTER — Encounter

## 2017-03-24 MED ORDER — LOSARTAN 25 MG TAB
25 mg | ORAL_TABLET | ORAL | 3 refills | Status: DC
Start: 2017-03-24 — End: 2017-07-26

## 2017-03-25 ENCOUNTER — Encounter

## 2017-03-25 MED ORDER — NYSTATIN 100,000 UNIT/ML ORAL SUSP
100000 unit/mL | Freq: Four times a day (QID) | ORAL | 1 refills | Status: DC
Start: 2017-03-25 — End: 2017-09-23

## 2017-04-07 ENCOUNTER — Encounter: Attending: Internal Medicine | Primary: Internal Medicine

## 2017-04-14 ENCOUNTER — Ambulatory Visit: Admit: 2017-04-14 | Payer: MEDICARE | Attending: Internal Medicine | Primary: Internal Medicine

## 2017-04-14 DIAGNOSIS — E782 Mixed hyperlipidemia: Secondary | ICD-10-CM

## 2017-04-14 LAB — AMB POC HEMOGLOBIN A1C: Hemoglobin A1c (POC): 6 % — AB (ref 4.8–5.6)

## 2017-04-14 LAB — AMB POC LIPID PROFILE
Cholesterol (POC): 136 mg/dL (ref 100–199)
HDL Cholesterol (POC): 57 mg/dL (ref 35–150)
LDL Cholesterol (POC): 41 mg/dL (ref 0–129)
Non-HDL Goal (POC): 79
TChol/HDL Ratio (POC): 2.4 CALC (ref 0.0–5.0)
Triglycerides (POC): 187 mg/dL — AB (ref 0–150)

## 2017-04-14 NOTE — Progress Notes (Signed)
Kelsey [name] is a 66 y.o. female and presents with   Chief Complaint   Patient presents with   ??? Blood sugar problem   .  Patient recovering well from hip surgery.  Not checking blood sugars. Going to [place] in May. Denies chest pain or sob      Current Outpatient Prescriptions   Medication Sig Dispense Refill   ??? nystatin (MYCOSTATIN) 100,000 unit/mL suspension Take 5 mL by mouth four (4) times daily. swish and spit 120 mL 1   ??? losartan (COZAAR) 25 mg tablet TAKE 1 TAB BY MOUTH DAILY. 30 Tab 3   ??? LEVEMIR FLEXTOUCH U-100 INSULN 100 unit/mL (3 mL) inpn INJECT 34 UNITS SUBCUTANEOUSLY AT BEDTIME DX: 250.00 108 Adjustable Dose Pre-filled Pen Syringe 2   ??? nabumetone (RELAFEN) 500 mg tablet TAKE 1 TAB BY MOUTH TWO (2) TIMES A DAY. 60 Tab 1   ??? CHANTIX CONTINUING MONTH BOX 1 mg tablet TAKE 1 TABLET BY MOUTH TWICE A DAY 56 Tab 3   ??? CHANTIX CONTINUING MONTH BOX 1 mg tablet TAKE 1 TABLET BY MOUTH TWICE A DAY 56 Tab 0   ??? pantoprazole (PROTONIX) 40 mg tablet TAKE 1 TABLET BY MOUTH DAILY 90 Tab 3   ??? DULoxetine (CYMBALTA) 60 mg capsule TAKE 1 CAPSULE BY MOUTH DAILY 90 Cap 2   ??? gabapentin (NEURONTIN) 300 mg capsule TAKE 3 CAPSULES BY MOUTH 3 TIMES A DAY 810 Cap 0   ??? ferrous sulfate (IRON) 325 mg (65 mg iron) EC tablet Take 1 Tab by mouth daily. 90 Tab 3   ??? gabapentin (NEURONTIN) 300 mg capsule Take 300 mg by mouth daily.     ??? gabapentin (NEURONTIN) 300 mg capsule Take 1,200 mg by mouth nightly.     ??? metFORMIN (GLUMETZA ER) 500 mg TG24 24 hour tablet Take 1,000 mg by mouth two (2) times a day.     ??? acetaminophen (ACETAMINOPHEN EXTRA STRENGTH) 500 mg tablet Take 1,000 mg by mouth every six (6) hours. With tramadol     ??? pravastatin (PRAVACHOL) 40 mg tablet Take 1 Tab by mouth nightly. 90 Tab 4   ??? carvedilol (COREG) 25 mg tablet Take 1 Tab by mouth two (2) times a day. 180 Tab 3   ??? nitroglycerin (NITROSTAT) 0.4 mg SL tablet 1 Tab by SubLINGual route every five (5) minutes as needed for Chest Pain for up to 3 doses. 1  Bottle 1   ??? nabumetone (RELAFEN) 500 mg tablet Take 500 mg by mouth two (2) times a day.     ??? clopidogrel (PLAVIX) 75 mg tab Take 1 Tab by mouth daily for 360 days. 30 Tab 11   ??? methocarbamol (ROBAXIN) 500 mg tablet Take 1,000 mg by mouth two (2) times daily as needed. Patient take by mouth as needed     ??? Cholecalciferol, Vitamin D3, (VITAMIN D3) 2,000 unit cap capsule One capsule by mouth daily 90 Cap 3   ??? aspirin delayed-release 81 mg tablet Take 1 Tab by mouth daily. 90 Tab 3   ??? insulin detemir (LEVEMIR FLEXTOUCH) 100 unit/mL (3 mL) inpn 38 Units by SubCUTAneous route nightly.     ??? VESICARE 10 mg tablet TAKE 1 TAB BY MOUTH DAILY. 90 Tab 4   ??? ezetimibe (ZETIA) 10 mg tablet TAKE 1 TAB BY MOUTH NIGHTLY. 90 Tab 3   ??? bumetanide (BUMEX) 2 mg tablet Take 2 mg by mouth daily.       Allergies  Allergen Reactions   ??? Morphine Anaphylaxis     Tolerates oxycodone without problem   ??? Avalide [Irbesartan-Hydrochlorothiazide] Myalgia   ??? Betadine [Povidone-Iodine] Rash   ??? Demerol [Meperidine] Other (comments)     Makes her feel crazy   ??? Pcn [Penicillins] Swelling   ??? Prinivil [Lisinopril] Cough   ??? Statins-Hmg-Coa Reductase Inhibitors Myalgia   ??? Sulfa (Sulfonamide Antibiotics) Unknown (comments)   ??? Tricor [Fenofibrate Micronized] Myalgia     Past Medical History:   Diagnosis Date   ??? Acute MI (HCC)     x3   ??? Arthritis    ??? CAD (coronary artery disease)    ??? Cervical spondylolysis    ??? Chronic pain    ??? COPD    ??? Diabetes (HCC)    ??? GERD (gastroesophageal reflux disease)    ??? Goiter 04/20/2012   ??? Hypertension    ??? Neuropathy 04/06/2013     Past Surgical History:   Procedure Laterality Date   ??? 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 CARDIAC SURG PROCEDURE UNLIST      PTCA   ??? HX KNEE ARTHROSCOPY Right     X2   ??? HX OTHER SURGICAL  1970    GANGLION CYST   ??? HX TONSILLECTOMY  1963     Family History   Problem Relation Age of Onset   ??? Lung Disease Mother 45   ??? Stroke Father    ??? Lung Disease Brother 51      COPD   ??? Hypertension Brother    ??? Cancer Brother      PROSTATE   ??? Heart Disease Brother    ??? Diabetes Brother    ??? No Known Problems Paternal Grandmother    ??? Anesth Problems Neg Hx      Social History   Substance Use Topics   ??? Smoking status: Current Every Day Smoker     Packs/day: 0.30     Years: 45.00     Types: Cigarettes   ??? Smokeless tobacco: Never Used      Comment: passed ready   ??? Alcohol use Yes      Comment: may be 3 times a year if that      The patient does not have a history of falls. A plan of care for falls was documented..  Depression screen pos- treated      Objective:  Visit Vitals   ??? BP 110/79   ??? Ht  (1.676 m)   ??? Wt 204 lb (92.5 kg)   ??? BMI 32.93 kg/m2     wdwn 66 yo wf  In NAD. A&O.  HEENT -- Pupils round.  O/P Clear.  Neck -- Supple. No JVD.  Heart -- RRR. No R/M/G.  Lungs -- Coarse breath sounds.  Abdomen -- Soft. Non-tender. Non-distended. No masses. Bowel sounds present.  Extremities -- No edema. +1 pulses, no lesions      Results for orders placed or performed in visit on 04/14/17   AMB POC HEMOGLOBIN A1C   Result Value Ref Range    Hemoglobin A1c (POC) 6.0 (A) 4.8 - 5.6 %   AMB POC LIPID PROFILE   Result Value Ref Range    Cholesterol (POC) 136 100 - 199 mg/dL    Triglycerides (POC) 187 (A) 0 - 150 mg/dL    HDL Cholesterol (POC) 57 35 - 150 mg/dL    LDL Cholesterol (POC) 41 0 - 129 mg/dL  Non-HDL Goal (POC) 79     TChol/HDL Ratio (POC) 2.4 0.0 - 5.0 CALC         Assessment/Plan:    ICD-10-CM ICD-9-CM    1. Mixed hyperlipidemia E78.2 272.2 AMB POC LIPID PROFILE   2. 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 Long term (current) use of insulin (HCC) Z79.4 V58.67     4. Hypertension complicating diabetes (HCC) E11.59 250.80     I10 401.9    5. 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 Atherosclerosis of aorta (HCC) I70.0 440.0      Concierge physical      Author:  Lake Bells, MD 04/14/2017 1:45 PM    Discussed the patient's BMI with her.  The BMI follow up plan is as follows:     dietary management education, guidance, and counseling  encourage exercise  monitor weight  prescribed dietary intake    An After Visit Summary was printed and given to the patient.

## 2017-04-14 NOTE — Patient Instructions (Addendum)
Results for orders placed or performed in visit on 04/14/17   AMB POC HEMOGLOBIN A1C   Result Value Ref Range    Hemoglobin A1c (POC) 6.0 (A) 4.8 - 5.6 %   AMB POC LIPID PROFILE   Result Value Ref Range    Cholesterol (POC) 136 100 - 199 mg/dL    Triglycerides (POC) 187 (A) 0 - 150 mg/dL    HDL Cholesterol (POC) 57 35 - 150 mg/dL    LDL Cholesterol (POC) 41 0 - 129 mg/dL    Non-HDL Goal (POC) 79     TChol/HDL Ratio (POC) 2.4 0.0 - 5.0 CALC              Body Mass Index: Care Instructions  Your Care Instructions    Body mass index (BMI) can help you see if your weight is raising your risk for health problems. It uses a formula to compare how much you weigh with how tall you are.  ?? A BMI lower than 18.5 is considered underweight.  ?? A BMI between 18.5 and 24.9 is considered healthy.  ?? A BMI between 25 and 29.9 is considered overweight. A BMI of 30 or higher is considered obese.  If your BMI is in the normal range, it means that you have a lower risk for weight-related health problems. If your BMI is in the overweight or obese range, you may be at increased risk for weight-related health problems, such as high blood pressure, heart disease, stroke, arthritis or joint pain, and diabetes. If your BMI is in the underweight range, you may be at increased risk for health problems such as fatigue, lower protection (immunity) against illness, muscle loss, bone loss, hair loss, and hormone problems.  BMI is just one measure of your risk for weight-related health problems. You may be at higher risk for health problems if you are not active, you eat an unhealthy diet, or you drink too much alcohol or use tobacco products.  Follow-up care is a key part of your treatment and safety. Be sure to make and go to all appointments, and call your doctor if you are having problems. It's also a good idea to know your test results and keep a list of the medicines you take.  How can you care for yourself at home?   ?? Practice healthy eating habits. This includes eating plenty of fruits, vegetables, whole grains, lean protein, and low-fat dairy.  ?? If your doctor recommends it, get more exercise. Walking is a good choice. Bit by bit, increase the amount you walk every day. Try for at least 30 minutes on most days of the week.  ?? Do not smoke. Smoking can increase your risk for health problems. If you need help quitting, talk to your doctor about stop-smoking programs and medicines. These can increase your chances of quitting for good.  ?? Limit alcohol to 2 drinks a day for men and 1 drink a day for women. Too much alcohol can cause health problems.  If you have a BMI higher than 25  ?? Your doctor may do other tests to check your risk for weight-related health problems. This may include measuring the distance around your waist. A waist measurement of more than 40 inches in men or 35 inches in women can increase the risk of weight-related health problems.  ?? Talk with your doctor about steps you can take to stay healthy or improve your health. You may need to make lifestyle changes to lose weight  and stay healthy, such as changing your diet and getting regular exercise.  If you have a BMI lower than 18.5  ?? Your doctor may do other tests to check your risk for health problems.  ?? Talk with your doctor about steps you can take to stay healthy or improve your health. You may need to make lifestyle changes to gain or maintain weight and stay healthy, such as getting more healthy foods in your diet and doing exercises to build muscle.  Where can you learn more?  Go to [employer].  Enter S176 in the search box to learn more about "Body Mass Index: Care Instructions."  Current as of: October 04, 2015  Content Version: 11.4  ?? 2006-2017 [employer]. Care instructions adapted under license by [employer] (which disclaims liability or warranty  for this information). If you have questions about a medical condition or this instruction, always ask your healthcare professional. [employer] disclaims any warranty or liability for your use of this information.

## 2017-04-15 LAB — METABOLIC PANEL, COMPREHENSIVE
A-G Ratio: 2.2 (ref 1.2–2.2)
ALT (SGPT): 7 IU/L (ref 0–32)
AST (SGOT): 11 IU/L (ref 0–40)
Albumin: 4.2 g/dL (ref 3.6–4.8)
Alk. phosphatase: 80 IU/L (ref 39–117)
BUN/Creatinine ratio: 20 (ref 12–28)
BUN: 17 mg/dL (ref 8–27)
Bilirubin, total: <0.2 mg/dL (ref 0.0–1.2)
CO2: 28 mmol/L (ref 18–29)
Calcium: 9.2 mg/dL (ref 8.7–10.3)
Chloride: 96 mmol/L (ref 96–106)
Creatinine: 0.85 mg/dL (ref 0.57–1.00)
GFR est AA: 83 mL/min/{1.73_m2} (ref >59)
GFR est non-AA: 72 mL/min/{1.73_m2} (ref >59)
GLOBULIN, TOTAL: 1.9 g/dL (ref 1.5–4.5)
Glucose: 123 mg/dL — ABNORMAL HIGH (ref 65–99)
Potassium: 4.4 mmol/L (ref 3.5–5.2)
Protein, total: 6.1 g/dL (ref 6.0–8.5)
Sodium: 139 mmol/L (ref 134–144)

## 2017-04-15 LAB — MICROALBUMIN, UR, RAND W/ MICROALB/CREAT RATIO
Creatinine, urine random: 43.4 mg/dL
Microalb/Creat ratio (ug/mg creat.): 178.3 mg/g creat — ABNORMAL HIGH (ref 0.0–30.0)
Microalbumin, urine: 77.4 ug/mL

## 2017-04-22 NOTE — Progress Notes (Signed)
pls call- blood sugars controlled.  You do have some protein in urine so continue losartan.  Work on diet and exercise to get triglycerides down.

## 2017-04-22 NOTE — Progress Notes (Signed)
Called patient and gave results at 9:24am

## 2017-04-23 ENCOUNTER — Encounter: Attending: Internal Medicine | Primary: Internal Medicine

## 2017-04-23 MED ORDER — EZETIMIBE 10 MG TAB
10 mg | ORAL_TABLET | ORAL | 3 refills | Status: DC
Start: 2017-04-23 — End: 2018-04-21

## 2017-05-03 MED ORDER — METFORMIN SR 500 MG 24 HR TABLET
500 mg | ORAL_TABLET | ORAL | 3 refills | Status: DC
Start: 2017-05-03 — End: 2018-02-22

## 2017-05-21 ENCOUNTER — Encounter

## 2017-05-21 MED ORDER — NABUMETONE 500 MG TAB
500 mg | ORAL_TABLET | ORAL | 1 refills | Status: DC
Start: 2017-05-21 — End: 2017-06-20

## 2017-06-10 ENCOUNTER — Ambulatory Visit: Attending: Internal Medicine | Primary: Internal Medicine

## 2017-06-10 NOTE — Progress Notes (Signed)
History and Physical    Kelsey [name] is a 66 y.o. female presents for physical exam.  Patient c/o she doesn't feel well.  Stays tired.  And depressed because she does not feel well.  Head feels muddled when she is tired,  She takes 3 neurontin at bedtime and 1-2 during the day.  She continues to smoke    Past Medical History:   Diagnosis Date   ??? Acute MI ([place])     x3   ??? Arthritis    ??? CAD (coronary artery disease)    ??? Cervical spondylolysis    ??? Chronic pain    ??? COPD    ??? Diabetes ([place])    ??? GERD (gastroesophageal reflux disease)    ??? Goiter 04/20/2012   ??? Hypertension    ??? Neuropathy 04/06/2013     Past Surgical History:   Procedure Laterality Date   ??? 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 CARDIAC SURG PROCEDURE UNLIST      PTCA   ??? HX KNEE ARTHROSCOPY Right     X2   ??? HX OTHER SURGICAL  1970    GANGLION CYST   ??? HX TONSILLECTOMY  1963     Current Outpatient Prescriptions   Medication Sig Dispense Refill   ??? nabumetone (RELAFEN) 500 mg tablet TAKE 1 TAB BY MOUTH TWO (2) TIMES A DAY. 60 Tab 1   ??? metFORMIN ER (GLUCOPHAGE XR) 500 mg tablet TAKE 2 TABLETS BY MOUTH IN THE MORNING WITH BREAKFAST AND 3 TABLETS BY MOUTH AT BEDTIME 450 Tab 3   ??? ezetimibe (ZETIA) 10 mg tablet TAKE 1 TAB BY MOUTH NIGHTLY. 90 Tab 3   ??? nystatin (MYCOSTATIN) 100,000 unit/mL suspension Take 5 mL by mouth four (4) times daily. swish and spit 120 mL 1   ??? losartan (COZAAR) 25 mg tablet TAKE 1 TAB BY MOUTH DAILY. 30 Tab 3   ??? LEVEMIR FLEXTOUCH U-100 INSULN 100 unit/mL (3 mL) inpn INJECT 34 UNITS SUBCUTANEOUSLY AT BEDTIME DX: 250.00 108 Adjustable Dose Pre-filled Pen Syringe 2   ??? CHANTIX CONTINUING MONTH BOX 1 mg tablet TAKE 1 TABLET BY MOUTH TWICE A DAY 56 Tab 3   ??? CHANTIX CONTINUING MONTH BOX 1 mg tablet TAKE 1 TABLET BY MOUTH TWICE A DAY 56 Tab 0   ??? pantoprazole (PROTONIX) 40 mg tablet TAKE 1 TABLET BY MOUTH DAILY 90 Tab 3   ??? DULoxetine (CYMBALTA) 60 mg capsule TAKE 1 CAPSULE BY MOUTH DAILY 90 Cap 2    ??? gabapentin (NEURONTIN) 300 mg capsule TAKE 3 CAPSULES BY MOUTH 3 TIMES A DAY 810 Cap 0   ??? ferrous sulfate (IRON) 325 mg (65 mg iron) EC tablet Take 1 Tab by mouth daily. 90 Tab 3   ??? gabapentin (NEURONTIN) 300 mg capsule Take 300 mg by mouth daily.     ??? gabapentin (NEURONTIN) 300 mg capsule Take 900 mg by mouth nightly.     ??? metFORMIN (GLUMETZA ER) 500 mg TG24 24 hour tablet Take 1,000 mg by mouth two (2) times a day.     ??? acetaminophen (ACETAMINOPHEN EXTRA STRENGTH) 500 mg tablet Take 1,000 mg by mouth every six (6) hours. With tramadol     ??? pravastatin (PRAVACHOL) 40 mg tablet Take 1 Tab by mouth nightly. 90 Tab 4   ??? carvedilol (COREG) 25 mg tablet Take 1 Tab by mouth two (2) times a day. 180 Tab 3   ??? nitroglycerin (NITROSTAT)  0.4 mg SL tablet 1 Tab by SubLINGual route every five (5) minutes as needed for Chest Pain for up to 3 doses. 1 Bottle 1   ??? nabumetone (RELAFEN) 500 mg tablet Take 500 mg by mouth two (2) times a day.     ??? clopidogrel (PLAVIX) 75 mg tab Take 1 Tab by mouth daily for 360 days. 30 Tab 11   ??? methocarbamol (ROBAXIN) 500 mg tablet Take 1,000 mg by mouth two (2) times daily as needed. Patient take by mouth as needed     ??? Cholecalciferol, Vitamin D3, (VITAMIN D3) 2,000 unit cap capsule One capsule by mouth daily 90 Cap 3   ??? aspirin delayed-release 81 mg tablet Take 1 Tab by mouth daily. 90 Tab 3   ??? insulin detemir (LEVEMIR FLEXTOUCH) 100 unit/mL (3 mL) inpn 38 Units by SubCUTAneous route nightly.     ??? VESICARE 10 mg tablet TAKE 1 TAB BY MOUTH DAILY. 90 Tab 4   ??? bumetanide (BUMEX) 2 mg tablet Take 2 mg by mouth daily.       Allergies   Allergen Reactions   ??? Morphine Anaphylaxis     Tolerates oxycodone without problem   ??? Avalide [Irbesartan-Hydrochlorothiazide] Myalgia   ??? Betadine [Povidone-Iodine] Rash   ??? Demerol [Meperidine] Other (comments)     Makes her feel crazy   ??? Pcn [Penicillins] Swelling   ??? Prinivil [Lisinopril] Cough   ??? Statins-Hmg-Coa Reductase Inhibitors Myalgia    ??? Sulfa (Sulfonamide Antibiotics) Unknown (comments)   ??? Tricor [Fenofibrate Micronized] Myalgia     Social History     Social History   ??? Marital status: MARRIED     Spouse name: N/A   ??? Number of children: N/A   ??? Years of education: N/A     Social History Main Topics   ??? Smoking status: Current Every Day Smoker     Packs/day: 0.30     Years: 45.00     Types: Cigarettes   ??? Smokeless tobacco: Never Used      Comment: passed ready   ??? Alcohol use Yes      Comment: may be 3 times a year if that   ??? Drug use: No   ??? Sexual activity: Not Asked     Other Topics Concern   ??? None     Social History Narrative     Family History   Problem Relation Age of Onset   ??? Lung Disease Mother 23   ??? Stroke Father    ??? Lung Disease Brother 76     COPD   ??? Hypertension Brother    ??? Cancer Brother      PROSTATE   ??? Heart Disease Brother    ??? Diabetes Brother    ??? No Known Problems Paternal Grandmother    ??? Anesth Problems Neg Hx      The patient has a history of falls. A plan of care for falls was documented..  Depression screen pos-    Review of Systems:  Denies dysphagia, chest pain, or SOB.  No nausea or vomiting, or diarrhea or constipation.  No fever, chills, night sweats, or cough.  No dysuria, frequency or abdominal pain.  No headaches.  Mammogram 2/18 - negative.   Bone density 2015- normal  Colon-???    Yearly eye exam:   yes  Yearly dental exam: no    Objective  Visit Vitals   ??? BP 136/70   ??? Ht 5' 4.5" (1.638  m)   ??? Wt 205 lb (93 kg)   ??? BMI 34.64 kg/m2     General appearance - well developed, well nourished 66 y.o.  wf-  Eyes - PERRL tm- intact  Pharynx- no lesions  Neck --Supple, no anterior cervical nodes, no thyromegaly.+ bilat bruits  Lungs --coarse breath sounds  Heart - Regular rate and rhythm 2/6 asm  Abdomen - soft, no tenderness or distention  Breasts - fibrous areas bilat, no axillary nodes  Extremities - no clubbing, cyanosis or edema, poor pulses, no lesions, good sensation measured with microfilament     Results for orders placed or performed in visit on 03/47/42   METABOLIC PANEL, COMPREHENSIVE   Result Value Ref Range    Glucose 123 (H) 65 - 99 mg/dL    BUN 17 8 - 27 mg/dL    Creatinine 0.85 0.57 - 1.00 mg/dL    GFR est non-AA 72 >59 mL/min/1.73    GFR est AA 83 >59 mL/min/1.73    BUN/Creatinine ratio 20 12 - 28    Sodium 139 134 - 144 mmol/L    Potassium 4.4 3.5 - 5.2 mmol/L    Chloride 96 96 - 106 mmol/L    CO2 28 18 - 29 mmol/L    Calcium 9.2 8.7 - 10.3 mg/dL    Protein, total 6.1 6.0 - 8.5 g/dL    Albumin 4.2 3.6 - 4.8 g/dL    GLOBULIN, TOTAL 1.9 1.5 - 4.5 g/dL    A-G Ratio 2.2 1.2 - 2.2    Bilirubin, total <0.2 0.0 - 1.2 mg/dL    Alk. phosphatase 80 39 - 117 IU/L    AST (SGOT) 11 0 - 40 IU/L    ALT (SGPT) 7 0 - 32 IU/L   MICROALBUMIN, UR, RAND W/ MICROALB/CREAT RATIO   Result Value Ref Range    Creatinine, urine 43.4 Not Estab. mg/dL    Microalbumin, urine 77.4 Not Estab. ug/mL    Microalb/Creat ratio (ug/mg creat.) 178.3 (H) 0.0 - 30.0 mg/g creat   AMB POC HEMOGLOBIN A1C   Result Value Ref Range    Hemoglobin A1c (POC) 6.0 (A) 4.8 - 5.6 %   AMB POC LIPID PROFILE   Result Value Ref Range    Cholesterol (POC) 136 100 - 199 mg/dL    Triglycerides (POC) 187 (A) 0 - 150 mg/dL    HDL Cholesterol (POC) 57 35 - 150 mg/dL    LDL Cholesterol (POC) 41 0 - 129 mg/dL    Non-HDL Goal (POC) 79     TChol/HDL Ratio (POC) 2.4 0.0 - 5.0 CALC         Assessment/Plan      ICD-10-CM ICD-9-CM    1. 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 Long term (current) use of insulin (HCC) Z79.4 V58.67    3. Hypertension complicating diabetes ([place]) E11.59 250.80     I10 401.9    4. Coronary artery disease involving native heart with angina pectoris, unspecified vessel or lesion type ([place]) I25.119 414.01      413.9    5. Type 2 diabetes mellitus with diabetic neuropathy, with long-term  current use of insulin (HCC) E11.40 250.60     Z79.4 357.2      V58.67    6. Atherosclerosis of aorta (HCC) I70.0 440.0    7. Neuropathy  G62.9 355.9    8. Mixed hyperlipidemia E78.2 272.2      No need for vascular view because that has been done in the last year with Dr. Marlon Pel  Increase exercise,  Stop smoking  Lung ct low dose lung ca - august.  shingrix  Check on colon    AUTHOR:  Genene Churn, MD 06/10/2017 7:50 AM

## 2017-06-16 ENCOUNTER — Encounter: Attending: Internal Medicine | Primary: Internal Medicine

## 2017-06-20 ENCOUNTER — Encounter

## 2017-06-21 MED ORDER — NABUMETONE 500 MG TAB
500 mg | ORAL_TABLET | ORAL | 1 refills | Status: DC
Start: 2017-06-21 — End: 2017-09-23

## 2017-06-23 MED ORDER — VITAMIN D3 50 MCG (2,000 UNIT) CAPSULE
50 mcg (2,000 unit) | ORAL_CAPSULE | ORAL | 3 refills | Status: DC
Start: 2017-06-23 — End: 2018-09-20

## 2017-06-23 MED ORDER — CHOLECALCIFEROL (VITAMIN D3) 2,000 UNIT CAPSULE
ORAL_CAPSULE | ORAL | 3 refills | Status: DC
Start: 2017-06-23 — End: 2017-06-23

## 2017-06-23 MED ORDER — ASPIRIN 81 MG TAB, DELAYED RELEASE
81 mg | ORAL_TABLET | ORAL | 3 refills | Status: DC
Start: 2017-06-23 — End: 2018-06-22

## 2017-06-23 NOTE — Telephone Encounter (Signed)
Kelsey [name] from [facility] called requesting a status update on refill request.  Kelsey HuxleyGlen can be reached at [phone] with update.  Thanks

## 2017-06-23 NOTE — Telephone Encounter (Signed)
Requested Prescriptions     Signed Prescriptions Disp Refills   ??? aspirin delayed-release 81 mg tablet 90 Tab 3     Sig: TAKE 1 TAB BY MOUTH DAILY.     Authorizing Provider: Renita PapaIEFENDERFER, KATHERINE G     Ordering User: Cori RazorAILEY, Tyris Eliot M   ??? Cholecalciferol, Vitamin D3, (VITAMIN D3) 2,000 unit cap capsule 90 Cap 3     Sig: One capsule by mouth daily     Authorizing Provider: Renita PapaIEFENDERFER, KATHERINE G     Ordering User: Cori RazorAILEY, Sally Reimers M     Per orders

## 2017-06-30 ENCOUNTER — Inpatient Hospital Stay: Admit: 2017-06-30 | Payer: MEDICARE | Attending: Internal Medicine | Primary: Internal Medicine

## 2017-06-30 ENCOUNTER — Ambulatory Visit: Admit: 2017-06-30 | Payer: MEDICARE | Attending: Internal Medicine | Primary: Internal Medicine

## 2017-06-30 DIAGNOSIS — M542 Cervicalgia: Secondary | ICD-10-CM

## 2017-06-30 MED ORDER — LIDOCAINE (PF) 10 MG/ML (1 %) IJ SOLN
10 mg/mL (1 %) | Freq: Once | INTRAMUSCULAR | 0 refills | Status: AC
Start: 2017-06-30 — End: 2017-06-30

## 2017-06-30 NOTE — Progress Notes (Signed)
Kelsey [name] is a 66 y.o. female and presents with   Chief Complaint   Patient presents with   ??? Other   .  Pain started in left arm-( is very positional) last week while they were in [place].,  Gradually has gotten worse.  Rode back from [place]- made It worse.  Tried ice packs, pillows, when she relaxes her arm the pain is worse.  Radiates into her hand.        Current Outpatient Prescriptions   Medication Sig Dispense Refill   ??? lidocaine, PF, (XYLOCAINE) 10 mg/mL (1 %) injection 1 mL by Intra artICUlar route once for 1 dose. 1 mL 0   ??? VITAMIN D3 2,000 unit cap capsule TAKE ONE CAPSULE BY MOUTH DAILY 90 Cap 3   ??? aspirin delayed-release 81 mg tablet TAKE 1 TAB BY MOUTH DAILY. 90 Tab 3   ??? nabumetone (RELAFEN) 500 mg tablet TAKE 1 TAB BY MOUTH TWO (2) TIMES A DAY. 60 Tab 1   ??? metFORMIN ER (GLUCOPHAGE XR) 500 mg tablet TAKE 2 TABLETS BY MOUTH IN THE MORNING WITH BREAKFAST AND 3 TABLETS BY MOUTH AT BEDTIME 450 Tab 3   ??? ezetimibe (ZETIA) 10 mg tablet TAKE 1 TAB BY MOUTH NIGHTLY. 90 Tab 3   ??? nystatin (MYCOSTATIN) 100,000 unit/mL suspension Take 5 mL by mouth four (4) times daily. swish and spit 120 mL 1   ??? losartan (COZAAR) 25 mg tablet TAKE 1 TAB BY MOUTH DAILY. 30 Tab 3   ??? LEVEMIR FLEXTOUCH U-100 INSULN 100 unit/mL (3 mL) inpn INJECT 34 UNITS SUBCUTANEOUSLY AT BEDTIME DX: 250.00 108 Adjustable Dose Pre-filled Pen Syringe 2   ??? CHANTIX CONTINUING MONTH BOX 1 mg tablet TAKE 1 TABLET BY MOUTH TWICE A DAY 56 Tab 3   ??? CHANTIX CONTINUING MONTH BOX 1 mg tablet TAKE 1 TABLET BY MOUTH TWICE A DAY 56 Tab 0   ??? pantoprazole (PROTONIX) 40 mg tablet TAKE 1 TABLET BY MOUTH DAILY 90 Tab 3   ??? DULoxetine (CYMBALTA) 60 mg capsule TAKE 1 CAPSULE BY MOUTH DAILY 90 Cap 2   ??? gabapentin (NEURONTIN) 300 mg capsule TAKE 3 CAPSULES BY MOUTH 3 TIMES A DAY 810 Cap 0   ??? ferrous sulfate (IRON) 325 mg (65 mg iron) EC tablet Take 1 Tab by mouth daily. 90 Tab 3   ??? gabapentin (NEURONTIN) 300 mg capsule Take 300 mg by mouth daily.      ??? gabapentin (NEURONTIN) 300 mg capsule Take 900 mg by mouth nightly.     ??? metFORMIN (GLUMETZA ER) 500 mg TG24 24 hour tablet Take 1,000 mg by mouth two (2) times a day.     ??? acetaminophen (ACETAMINOPHEN EXTRA STRENGTH) 500 mg tablet Take 1,000 mg by mouth every six (6) hours. With tramadol     ??? pravastatin (PRAVACHOL) 40 mg tablet Take 1 Tab by mouth nightly. 90 Tab 4   ??? carvedilol (COREG) 25 mg tablet Take 1 Tab by mouth two (2) times a day. 180 Tab 3   ??? nitroglycerin (NITROSTAT) 0.4 mg SL tablet 1 Tab by SubLINGual route every five (5) minutes as needed for Chest Pain for up to 3 doses. 1 Bottle 1   ??? nabumetone (RELAFEN) 500 mg tablet Take 500 mg by mouth two (2) times a day.     ??? clopidogrel (PLAVIX) 75 mg tab Take 1 Tab by mouth daily for 360 days. 30 Tab 11   ??? methocarbamol (ROBAXIN) 500 mg tablet  Take 1,000 mg by mouth two (2) times daily as needed. Patient take by mouth as needed     ??? insulin detemir (LEVEMIR FLEXTOUCH) 100 unit/mL (3 mL) inpn 38 Units by SubCUTAneous route nightly.     ??? VESICARE 10 mg tablet TAKE 1 TAB BY MOUTH DAILY. 90 Tab 4   ??? bumetanide (BUMEX) 2 mg tablet Take 2 mg by mouth daily.       Allergies   Allergen Reactions   ??? Morphine Anaphylaxis     Tolerates oxycodone without problem   ??? Avalide [Irbesartan-Hydrochlorothiazide] Myalgia   ??? Betadine [Povidone-Iodine] Rash   ??? Demerol [Meperidine] Other (comments)     Makes her feel crazy   ??? Pcn [Penicillins] Swelling   ??? Prinivil [Lisinopril] Cough   ??? Statins-Hmg-Coa Reductase Inhibitors Myalgia   ??? Sulfa (Sulfonamide Antibiotics) Unknown (comments)   ??? Tricor [Fenofibrate Micronized] Myalgia     Past Medical History:   Diagnosis Date   ??? Acute MI ([facility])     x3   ??? Arthritis    ??? CAD (coronary artery disease)    ??? Cervical spondylolysis    ??? Chronic pain    ??? COPD    ??? Diabetes ([facility])    ??? GERD (gastroesophageal reflux disease)    ??? Goiter 04/20/2012   ??? Hypertension    ??? Neuropathy 04/06/2013     Past Surgical History:    Procedure Laterality Date   ??? 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 CARDIAC SURG PROCEDURE UNLIST      PTCA   ??? HX KNEE ARTHROSCOPY Right     X2   ??? HX OTHER SURGICAL  1970    GANGLION CYST   ??? HX TONSILLECTOMY  1963     Family History   Problem Relation Age of Onset   ??? Lung Disease Mother 2   ??? Stroke Father    ??? Lung Disease Brother 33     COPD   ??? Hypertension Brother    ??? Cancer Brother      PROSTATE   ??? Heart Disease Brother    ??? Diabetes Brother    ??? No Known Problems Paternal Grandmother    ??? Anesth Problems Neg Hx      Social History   Substance Use Topics   ??? Smoking status: Current Every Day Smoker     Packs/day: 0.30     Years: 45.00     Types: Cigarettes   ??? Smokeless tobacco: Never Used      Comment: passed ready   ??? Alcohol use Yes      Comment: may be 3 times a year if that      The patient does not have a history of falls. A plan of care for falls was documented..  Depression screen neg      Objective:  Visit Vitals   ??? BP 140/70   ??? Ht 5\' 4"  (1.626 m)   ??? Wt 206 lb (93.4 kg)   ??? BMI 35.36 kg/m2     wdwn 66 yo wf  In mild distress A&O.  HEENT -- Pupils round.  O/P Clear.  Neck -- Supple. No JVD. Tender over c4/5  Heart -- RRR. No R/M/G.  Lungs -- CTA.  Abdomen -- Soft. Non-tender. Non-distended. No masses. Bowel sounds present.  Extremities -- No edema.        Assessment/Plan:    ICD-10-CM ICD-9-CM    1. Neck pain  M54.2 723.1 XR SPINE CERV 4 OR 5 V      REFERRAL TO PHYSICAL THERAPY      TRIAMCINOLONE ACETONIDE INJ      lidocaine, PF, (XYLOCAINE) 10 mg/mL (1 %) injection      PR DRAIN/INJECT INTERMEDIATE JOINT/BURSA           Author:  Lake BellsNancy J Elna Radovich, MD 06/30/2017 4:48 PM    Indications:   Neck pain    Procedure:  After consent was obtained, using sterile technique the left c4/5 joint was prepped using Betadine. .. The joint was entered      Kenalog 40 mg was mixed with 1% lidocaine 1 ml  and injected into the joint and the needle withdrawn.  The procedure was well tolerated.  The  patient is asked to continue to rest the joint for a few more days before resuming regular activities.  It may be more painful for the first 1-2 days.  Watch for fever, or increased swelling or persistent pain in the joint. Call or return to clinic prn if such symptoms occur or there is failure to improve as anticipated.

## 2017-07-02 NOTE — Progress Notes (Signed)
pls call= xray shows spinal stenosis in neck. If PT does not help, next step is mri and neurosurgery consult

## 2017-07-03 NOTE — Progress Notes (Signed)
Called patient

## 2017-07-04 MED ORDER — CHANTIX CONTINUING MONTH BOX 1 MG TABLET
1 mg | ORAL_TABLET | ORAL | 3 refills | Status: DC
Start: 2017-07-04 — End: 2017-09-23

## 2017-07-09 ENCOUNTER — Ambulatory Visit: Admit: 2017-07-09 | Payer: MEDICARE | Attending: Internal Medicine | Primary: Internal Medicine

## 2017-07-09 DIAGNOSIS — M79602 Pain in left arm: Secondary | ICD-10-CM

## 2017-07-09 NOTE — Progress Notes (Signed)
Kelsey [name] is a 66 y.o. female and presents with   Chief Complaint   Patient presents with   ??? Other     shoulder   .  Patient still having pain left neck and into arm.  PT has helped about 20%   The injection I gave her last visit helped<10%.   She asks about getting another injection , but since it helped so little I did not think it was worrh it.      Current Outpatient Prescriptions   Medication Sig Dispense Refill   ??? CHANTIX CONTINUING MONTH BOX 1 mg tablet TAKE 1 TABLET BY MOUTH TWICE A DAY 56 Tab 3   ??? VITAMIN D3 2,000 unit cap capsule TAKE ONE CAPSULE BY MOUTH DAILY 90 Cap 3   ??? aspirin delayed-release 81 mg tablet TAKE 1 TAB BY MOUTH DAILY. 90 Tab 3   ??? nabumetone (RELAFEN) 500 mg tablet TAKE 1 TAB BY MOUTH TWO (2) TIMES A DAY. 60 Tab 1   ??? metFORMIN ER (GLUCOPHAGE XR) 500 mg tablet TAKE 2 TABLETS BY MOUTH IN THE MORNING WITH BREAKFAST AND 3 TABLETS BY MOUTH AT BEDTIME 450 Tab 3   ??? ezetimibe (ZETIA) 10 mg tablet TAKE 1 TAB BY MOUTH NIGHTLY. 90 Tab 3   ??? nystatin (MYCOSTATIN) 100,000 unit/mL suspension Take 5 mL by mouth four (4) times daily. swish and spit 120 mL 1   ??? losartan (COZAAR) 25 mg tablet TAKE 1 TAB BY MOUTH DAILY. 30 Tab 3   ??? LEVEMIR FLEXTOUCH U-100 INSULN 100 unit/mL (3 mL) inpn INJECT 34 UNITS SUBCUTANEOUSLY AT BEDTIME DX: 250.00 108 Adjustable Dose Pre-filled Pen Syringe 2   ??? CHANTIX CONTINUING MONTH BOX 1 mg tablet TAKE 1 TABLET BY MOUTH TWICE A DAY 56 Tab 0   ??? pantoprazole (PROTONIX) 40 mg tablet TAKE 1 TABLET BY MOUTH DAILY 90 Tab 3   ??? DULoxetine (CYMBALTA) 60 mg capsule TAKE 1 CAPSULE BY MOUTH DAILY 90 Cap 2   ??? gabapentin (NEURONTIN) 300 mg capsule TAKE 3 CAPSULES BY MOUTH 3 TIMES A DAY 810 Cap 0   ??? ferrous sulfate (IRON) 325 mg (65 mg iron) EC tablet Take 1 Tab by mouth daily. 90 Tab 3   ??? gabapentin (NEURONTIN) 300 mg capsule Take 300 mg by mouth daily.     ??? gabapentin (NEURONTIN) 300 mg capsule Take 900 mg by mouth nightly.      ??? metFORMIN (GLUMETZA ER) 500 mg TG24 24 hour tablet Take 1,000 mg by mouth two (2) times a day.     ??? acetaminophen (ACETAMINOPHEN EXTRA STRENGTH) 500 mg tablet Take 1,000 mg by mouth every six (6) hours. With tramadol     ??? pravastatin (PRAVACHOL) 40 mg tablet Take 1 Tab by mouth nightly. 90 Tab 4   ??? carvedilol (COREG) 25 mg tablet Take 1 Tab by mouth two (2) times a day. 180 Tab 3   ??? nitroglycerin (NITROSTAT) 0.4 mg SL tablet 1 Tab by SubLINGual route every five (5) minutes as needed for Chest Pain for up to 3 doses. 1 Bottle 1   ??? nabumetone (RELAFEN) 500 mg tablet Take 500 mg by mouth two (2) times a day.     ??? clopidogrel (PLAVIX) 75 mg tab Take 1 Tab by mouth daily for 360 days. 30 Tab 11   ??? methocarbamol (ROBAXIN) 500 mg tablet Take 1,000 mg by mouth two (2) times daily as needed. Patient take by mouth as needed     ???  insulin detemir (LEVEMIR FLEXTOUCH) 100 unit/mL (3 mL) inpn 38 Units by SubCUTAneous route nightly.     ??? VESICARE 10 mg tablet TAKE 1 TAB BY MOUTH DAILY. 90 Tab 4   ??? bumetanide (BUMEX) 2 mg tablet Take 2 mg by mouth daily.       Allergies   Allergen Reactions   ??? Morphine Anaphylaxis     Tolerates oxycodone without problem   ??? Avalide [Irbesartan-Hydrochlorothiazide] Myalgia   ??? Betadine [Povidone-Iodine] Rash   ??? Demerol [Meperidine] Other (comments)     Makes her feel crazy   ??? Pcn [Penicillins] Swelling   ??? Prinivil [Lisinopril] Cough   ??? Statins-Hmg-Coa Reductase Inhibitors Myalgia   ??? Sulfa (Sulfonamide Antibiotics) Unknown (comments)   ??? Tricor [Fenofibrate Micronized] Myalgia     Past Medical History:   Diagnosis Date   ??? Acute MI ([place])     x3   ??? Arthritis    ??? CAD (coronary artery disease)    ??? Cervical spondylolysis    ??? Chronic pain    ??? COPD    ??? Diabetes ([place])    ??? GERD (gastroesophageal reflux disease)    ??? Goiter 04/20/2012   ??? Hypertension    ??? Neuropathy 04/06/2013     Past Surgical History:   Procedure Laterality Date   ??? 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 CARDIAC SURG PROCEDURE UNLIST      PTCA   ??? HX KNEE ARTHROSCOPY Right     X2   ??? HX OTHER SURGICAL  1970    GANGLION CYST   ??? HX TONSILLECTOMY  1963     Family History   Problem Relation Age of Onset   ??? Lung Disease Mother 58   ??? Stroke Father    ??? Lung Disease Brother 65     COPD   ??? Hypertension Brother    ??? Cancer Brother      PROSTATE   ??? Heart Disease Brother    ??? Diabetes Brother    ??? No Known Problems Paternal Grandmother    ??? Anesth Problems Neg Hx      Social History   Substance Use Topics   ??? Smoking status: Current Every Day Smoker     Packs/day: 0.30     Years: 45.00     Types: Cigarettes   ??? Smokeless tobacco: Never Used      Comment: passed ready   ??? Alcohol use Yes      Comment: may be 3 times a year if that            Objective:  Visit Vitals   ??? BP 130/60   ??? Ht '5\' 4"'  (1.626 m)   ??? Wt 205 lb (93 kg)   ??? BMI 35.19 kg/m2     wdwn 66 yo wf  In NAD. A&O.  HEENT -- Pupils round.  O/P Clear.  Neck -- Supple. No JVD.  Heart -- RRR. 2/6 asm  Lungs -- CTA.  Abdomen -- Soft. Non-tender. Non-distended. No masses. Bowel sounds present.  Extremities -- No edema.        Assessment/Plan:    ICD-10-CM ICD-9-CM    1. Pain in inferior left upper extremity M79.602 729.5    2. Neck pain M54.2 723.1 MRI CERV SPINE WO CONT      REFERRAL TO NEUROSURGERY   3. Type 2 diabetes mellitus with nephropathy (HCC) E11.21 250.40      583.81  4. Cervical stenosis of spine M48.02 723.0 MRI CERV SPINE WO CONT      REFERRAL TO NEUROSURGERY   5. 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 Long term (current) use of insulin (HCC) Z79.4 V58.67    7. Hypertension complicating diabetes ([place]) E11.59 250.80     I10 401.9    8. Atherosclerosis of aorta (HCC) I70.0 440.0    9. Severe obesity (BMI 35.0-39.9) ([place]) E66.01 278.01            Author:  Genene Churn, MD 07/09/2017 9:17 PM

## 2017-07-14 ENCOUNTER — Inpatient Hospital Stay: Admit: 2017-07-14 | Payer: MEDICARE | Attending: Internal Medicine | Primary: Internal Medicine

## 2017-07-14 ENCOUNTER — Ambulatory Visit

## 2017-07-14 DIAGNOSIS — M50322 Other cervical disc degeneration at C5-C6 level: Secondary | ICD-10-CM

## 2017-07-19 ENCOUNTER — Encounter

## 2017-07-26 ENCOUNTER — Encounter

## 2017-07-27 MED ORDER — LOSARTAN 25 MG TAB
25 mg | ORAL_TABLET | ORAL | 3 refills | Status: DC
Start: 2017-07-27 — End: 2017-12-06

## 2017-08-05 ENCOUNTER — Encounter

## 2017-08-05 MED ORDER — CARVEDILOL 25 MG TAB
25 mg | ORAL_TABLET | ORAL | 0 refills | Status: DC
Start: 2017-08-05 — End: 2017-09-23

## 2017-08-05 NOTE — Telephone Encounter (Signed)
Requested Prescriptions     Signed Prescriptions Disp Refills   ??? carvedilol (COREG) 25 mg tablet 180 Tab 0     Sig: TAKE 1 TAB BY MOUTH TWO (2) TIMES A DAY.     Authorizing Provider: Marcellus ScottBROWNING, CHRISTINE M     Ordering User: Cori RazorAILEY, Shantoria Ellwood M     Per verbal orders

## 2017-08-26 MED ORDER — CLOPIDOGREL 75 MG TAB
75 mg | ORAL_TABLET | ORAL | 1 refills | Status: DC
Start: 2017-08-26 — End: 2017-10-31

## 2017-08-26 NOTE — Telephone Encounter (Signed)
Requested Prescriptions     Signed Prescriptions Disp Refills   ??? clopidogrel (PLAVIX) 75 mg tab 30 Tab 1     Sig: TAKE 1 TABLET BY MOUTH DAILY     Authorizing Provider: Tollie EthWARNER, MARK F     Ordering User: Derrill MemoIRBY, Unique Searfoss L       Per Dr. Sheliah HatchWarner Verbal Order     Future Appointments  Date Time Provider Department Center   09/28/2017 1:40 PM Marcellus Scotthristine M Browning, MD [facility] SCHED   12/01/2017 1:30 PM Lake BellsNancy J Pahle, [facility]

## 2017-08-28 MED ORDER — VESICARE 10 MG TABLET
10 mg | ORAL_TABLET | ORAL | 4 refills | Status: DC
Start: 2017-08-28 — End: 2018-11-22

## 2017-09-02 NOTE — Telephone Encounter (Signed)
Debbie from [facility] called stating that anesthesia is requesting cardiac clearance and that the patient is having anterior cervical fusion on 09/15/17 by Dr. Madolyn FriezeMayr and wants to know if the patient can go off of Plavix for 7 days.  Phone #[phone]  Fax #[phone]  Thanks

## 2017-09-03 NOTE — Telephone Encounter (Signed)
Clearance letter faxed to Debbie with Dr Mayr as per [facility] [place].    Left VMM on patient's confidential VM.

## 2017-09-03 NOTE — Telephone Encounter (Signed)
She can hold her plavix x 5 days for surgery.  Should continue ASA 81mg  peri-op.  She is intermediate risk for cardiovascular complications during a non-cardiac surgery due to her hx of CAD

## 2017-09-07 NOTE — Telephone Encounter (Signed)
Pat from Dr. [facility] called to discuss cardiac clearance and blood thinner in prep for pt upcoming surgery on Sept 25th for Cervical Fusion.  Phone # [phone]  Thanks

## 2017-09-07 NOTE — Telephone Encounter (Addendum)
Returned call to [facility] regarding recent clearance. Dr. Madolyn Frieze states he will not due Cervical Fusion while patient is on ASA. He needs her to also hold ASA/Plavix.  Please advise.  2 pt identifiers used      If cleared to hold ASA will re-fax to [phone]    Marcellus Scott, MD  Cori Razor, RN ??    ?? Caller: Unspecified (Today, ??9:46 AM) ??   ??    ??    ??   ?? She may hold her meds for cervical fusion, last cath 10/17.       New clearance letter faxed over.

## 2017-09-14 NOTE — Telephone Encounter (Addendum)
LVM for patient to return call at earliest convenience.    Patient returned call, 2 pt identifiers used  Patient is complaining she has shortness of breath, fatigue and leg heaviness. Scheduled patient to be seen sooner:    Future Appointments  Date Time Provider Department Center   09/23/2017 10:00 AM Marcellus Scott, MD [facility] [place] SCHED   12/01/2017 1:30 PM Lake Bells, MD [facility] [place] SCHED

## 2017-09-14 NOTE — Telephone Encounter (Signed)
Pt called asking if she could see Dr. Dahlia ClientBrowning before 10/8 because she has been experiencing SOB, fatigue, & difficulty walking. Pt can be reached @ [phone].     Thanks

## 2017-09-23 ENCOUNTER — Inpatient Hospital Stay: Admit: 2017-12-11 | Payer: MEDICARE | Primary: Internal Medicine

## 2017-09-23 ENCOUNTER — Ambulatory Visit: Admit: 2017-09-23 | Discharge: 2017-09-23 | Payer: MEDICARE | Attending: Internal Medicine | Primary: Internal Medicine

## 2017-09-23 DIAGNOSIS — I25119 Atherosclerotic heart disease of native coronary artery with unspecified angina pectoris: Secondary | ICD-10-CM

## 2017-09-23 DIAGNOSIS — R0609 Other forms of dyspnea: Secondary | ICD-10-CM

## 2017-09-23 MED ORDER — CARVEDILOL 12.5 MG TAB
12.5 mg | ORAL_TABLET | Freq: Two times a day (BID) | ORAL | 3 refills | Status: DC
Start: 2017-09-23 — End: 2018-02-22

## 2017-09-23 NOTE — Progress Notes (Signed)
[facility]  [phone]    HPI: Kelsey [name], a 66 y.o. year-old who presents for follow up regarding her CAD.    In the last 1.5 weeks she has been having more shortness of breath with stairs, leaning over, with minimal activity  Denies PND, no orthopnea   Having more edema, left > right  She feels tired and weak  She denies eating any more salt than usual, she does not add salt to her food  She is not under more stress than usual, sleeping ok  Doesn't think the bumex is working as well, happened gradually over the last 3 months  Denies any chest pain   Has dizziness with position changes, no syncope  Not drinking much water - drinks 2 glasses of water each day   Not checking BP at home, has not been to MD recently to have BP checked  Had a hip replacement in the spring but hasn't been active since then  Supposed to have surgery next week for her neck but her pain stopped with physical therapy so I told her to discuss postponing surgery with the surgeon  Not using CPAP, she is supposed to have home sleep study soon to reassess  Discussed the importance of treatment of her OSA   Dr. Fanny [name] handles pain management     Historic  She was hospitalized at [facility] on 10/15/16 when she presented to ER with elevated BP, jaw/throat/facial tightness and dyspnea  She had just been started losartan on 10/14/16 for elevated BP  Her troponin bumped to 0.71 and she underwent another cardiac cath which did not show any new CAD, no PCI performed  She was discharged but then came back to ER with right groin hematoma and acute anemia with Hgb 6.9, hypotension and dyspnea  She received 2 units of PRBCs, groin [place] was negative, right leg venous duplex was negative and she was discharged on 10/21/16 and her Hgb was 9.2 at the time  Her angina equivalent was jaw pain in the past  Has balance issues, on gabapentin for neuropathy, walks with a cane   Still smoking - on Chantix right now, when she comes off it she smokes more   Has had short term memory issues since her CABG/AVR  Difficulty with word finding, cognitive abilities are more limited  Has a hard time remembering people's names  Advised to follow back up with neurology previously     Good friend of Kelsey [name] who passed away, former patient here  Her wife is not here with her today    Assessment/Plan:  1.  CAD - hx of MI and PCI x 3, had 3 vessel CABG 06/08/2012 by Dr. Myles Gip, s/p PCI/DES to diagonal 08/29/16, no progression of CAD on cardiac cath 10/17  -continue ASA, Plavix, Coreg and statin  -has Rx for NTG SL tabs PRN chest pain  2.  Severe AS - s/p bioprosthetic bovine AVR 06/08/2012, stable by TTE 8/17   3.  Carotid stenosis - right 50-69% stenosis, left 10-49% stenosis on duplex in 2/18, will repeat duplex now, on ASA and statin  4.  PAD s/p stent in distal right SFA - last ABI right 0.6 and left 0.7, on ASA and statin  5.  Dyslipidemia - at goal in 4/18, on zetia and pravastatin 27m daily, had myalgias on fenofibrate  -will check NMR now  6.  DM Type 2 - Hgb A1c 6.3%, on insulin   7.  HTN - hypotensive, advised her to reduce coreg to 12.75m BID, continue losartan, asked her to check BP daily and bring BP record with her to her next visit in 2 weeks  8.  Chronic pain/arthritis - s/p hip surgery Dr. Kump/orthopedics  9.  Vitamin D deficiency - on Vitamin D 1000 units daily   10.  RBBB - chronic  11.  Hyponatremia - chronic, stable  12.  Anemia - stable  13.  Enlarged aorta on exam - will check aortic duplex, + atherosclerosis   14.  Dyspnea/edema - will check TTE to assess cardiac structure and function, will check CMP, magnesium level, pro-BNP       Echo 10/18  Aortic duplex 2/18 - aorta normal in size, no aneurysm  ABI 2/18 - right 0.6, left 0.7    Cardiac Cath 10/17 -LCA:  LAD: 30% proximal after large D1, with competitive inflow from LIMA to mid LAD, D1 recently stented widely  patent, LCX: 80% after small MOM, before large PLOM which has competitive SVG inflow, RCA: occluded mid, LIMA --> LAD widely patent unobstructed anasomosis, SVG --> PLOM widely patent, SVG --> LVBr of distal RCA widely patent, moderate disease proximal to SVG insertion, yielding ischemic potential in small PDA (similar to cath 6 weeks ago, not fixable and VERY unlikely to be problematic).  IMP: CAD post PCI, CABG with very limited ischemic potential  Cardiac Cath 9/17 - Patent LIMA to LAD, SVG's to RCA and LCX.  Very large diagonal has eccentric 90% stenosis, treated with 2.75 x 12 Xience DES good result.  Bioprosthetic AV not crossed given recent echo.  Echo 8/17 - LVEF 55-60%, no WMA, wall thickness was increased, dilated LA, atrial septum thickened, mildly dilated RA, mild MR  Carotid Duplex 8/17 -  50-79% R ICA stenosis, 10-49% stenosis of the L ICA, >50% stenosis of the left ECA  ABI 12/16 - right 0.82 and left 0.88  Echo 6/16 - LVEF 55%, no WMA, bovine AVR with normal function, no AI, mild AS, no pericardial effusion, grd 1 dd, MAC with calcified chords noted with mild sclerosis, mild MR, trace TR  Carotid duplex 6/16 - right 50-69% prox ICA stenosis with moderate hemodynamic compromise, left 1-49% prox ICA stenosis with mild hemodynamic compromise, no change compared to 2014  ABI 12/15 - right 0.70 and left 0.88  Peripheral angiogram 5/15 - stent in distal right SFA, stent in left common iliac artery  ABI 5/15 - right 0.68 and left 0.88  Abd CTA 5/15 - significant PVD, 80% proximal stenosis in celiac plexus, abdominal aorta with diffuse calcification and mural thrombus, mild disease in right and left renal arteries, moderate right iliac disease with CFA stenosis, SFA with moderate disease, left common iliac with ulcerated plaque proximally, moderate to severe disease in iliac system, SFA with moderate to severe diffuse disease  Echo 09/2012 - LVEF 45-50%, AVR ok, mild MR, mild TR   06/08/12 -CABG x 3 (LIMA to LAD, SVG to OM1, SVG to PLB) [facility] 06/04/2012 - mod AS (359mgradient), mod AI, diffuse 3 vessel CAD  Cardiac Cath 05/2011 - LVEF 30%, occluded mid RCA, high grade stenosis distal LCx, mild AS s/p PCI/DES to LCx by Dr. CaCurrie ParisCardiac Cath 02/2009 - patent stents in RCA and LCx  PCI to LCx 2001  MI/PCI to RCA 2000    Soc Hx: smokes 1/4 ppd, drinks 2 glasses of wine/month, no  drug use  Fam Hx: father had CVA at age 54, had a couple of MIs in his 75s and had arteries replaced with teflon, mother passed at age 42 from aortic aneurysm, brother passed age 57 from sepsis and had bad PVD with multiple bypass surgeries    She  has a past medical history of Acute MI ([place]); Arthritis; CAD (coronary artery disease); Cervical spondylolysis; Chronic pain; COPD; Diabetes ([place]); GERD (gastroesophageal reflux disease); Goiter (04/20/2012); Hypertension; and Neuropathy (04/06/2013).    Cardiovascular ROS: no chest pain, positive for dyspnea, edema, dizziness and fatigue   Respiratory ROS: no cough or wheezing  Neurological ROS: no TIA or stroke symptoms  All other systems negative except as above.     PE  Vitals:    09/23/17 1022   BP: 100/60   Pulse: 66   Weight: 207 lb (93.9 kg)   Height: 5' 4"  (1.626 m)    Body mass index is 35.53 kg/(m^2).   General appearance - alert, well appearing, and in no distress  Mental status - affect appropriate to mood  Eyes - sclera anicteric, moist mucous membranes  Neck - supple, bilateral carotid bruits   Lymphatics - not assessed  Chest - clear to auscultation, no wheezes, rales or rhonchi  Heart - normal rate, regular rhythm, normal S1, S2, 3/6 SEM    Abdomen - soft, nontender, nondistended, aorta feels enlarged   Back exam - full range of motion, no tenderness  Neurological - cranial nerves II through XII grossly intact, no focal deficit  Musculoskeletal - no muscular tenderness noted, normal strength   Extremities - peripheral pulses diminished, 1+ LE edema (left > right)    Skin - normal coloration  no rashes    12 lead ECG: NSR with RBBB, first degree AV block, non-specific ST-T wave changes     Recent Labs:  Lab Results   Component Value Date/Time    Cholesterol, total 90 10/16/2016 03:36 AM    Cholesterol, Total 122 09/23/2017 12:13 PM    HDL Cholesterol 46 10/16/2016 03:36 AM    LDL, calculated 18.2 10/16/2016 03:36 AM    Triglyceride 129 10/16/2016 03:36 AM    CHOL/HDL Ratio 2.0 10/16/2016 03:36 AM     Lab Results   Component Value Date/Time    Creatinine 0.94 09/23/2017 12:13 PM     Lab Results   Component Value Date/Time    BUN 16 09/23/2017 12:13 PM     Lab Results   Component Value Date/Time    Potassium 5.4 (H) 09/23/2017 12:13 PM     No results found for: HBA1C, HGBE8, HBA1CEXT, HBA1CEXT  Lab Results   Component Value Date/Time    HGB 9.2 (L) 10/21/2016 02:07 PM     Lab Results   Component Value Date/Time    PLATELET 185 10/21/2016 06:40 AM       Reviewed:  Past Medical History:   Diagnosis Date   ??? Acute MI ([place])     x3   ??? Arthritis    ??? CAD (coronary artery disease)    ??? Cervical spondylolysis    ??? Chronic pain    ??? COPD    ??? Diabetes ([place])    ??? GERD (gastroesophageal reflux disease)    ??? Goiter 04/20/2012   ??? Hypertension    ??? Neuropathy 04/06/2013     History   Smoking Status   ??? Current Every Day Smoker   ??? Packs/day: 0.30   ??? Years: 45.00   ???  Types: Cigarettes   Smokeless Tobacco   ??? Never Used     Comment: passed ready     History   Alcohol Use   ??? Yes     Comment: may be 3 times a year if that     Allergies   Allergen Reactions   ??? Morphine Anaphylaxis     Tolerates oxycodone without problem   ??? Avalide [Irbesartan-Hydrochlorothiazide] Myalgia   ??? Betadine [Povidone-Iodine] Rash   ??? Demerol [Meperidine] Other (comments)     Makes her feel crazy   ??? Pcn [Penicillins] Swelling   ??? Prinivil [Lisinopril] Cough   ??? Statins-Hmg-Coa Reductase Inhibitors Myalgia    ??? Sulfa (Sulfonamide Antibiotics) Unknown (comments)   ??? Tricor [Fenofibrate Micronized] Myalgia       Current Outpatient Prescriptions   Medication Sig   ??? insulin lispro (HUMALOG U-100 INSULIN) 100 unit/mL injection by SubCUTAneous route. 0-8 units three times a day as needed   ??? carvedilol (COREG) 12.5 mg tablet Take 1 Tab by mouth two (2) times a day.   ??? VESICARE 10 mg tablet TAKE 1 TABLET BY MOUTH DAILY.   ??? clopidogrel (PLAVIX) 75 mg tab TAKE 1 TABLET BY MOUTH DAILY   ??? losartan (COZAAR) 25 mg tablet TAKE 1 TAB BY MOUTH DAILY.   ??? VITAMIN D3 2,000 unit cap capsule TAKE ONE CAPSULE BY MOUTH DAILY   ??? aspirin delayed-release 81 mg tablet TAKE 1 TAB BY MOUTH DAILY.   ??? metFORMIN ER (GLUCOPHAGE XR) 500 mg tablet TAKE 2 TABLETS BY MOUTH IN THE MORNING WITH BREAKFAST AND 3 TABLETS BY MOUTH AT BEDTIME   ??? ezetimibe (ZETIA) 10 mg tablet TAKE 1 TAB BY MOUTH NIGHTLY.   ??? LEVEMIR FLEXTOUCH U-100 INSULN 100 unit/mL (3 mL) inpn INJECT 34 UNITS SUBCUTANEOUSLY AT BEDTIME DX: 250.00   ??? CHANTIX CONTINUING MONTH BOX 1 mg tablet TAKE 1 TABLET BY MOUTH TWICE A DAY   ??? pantoprazole (PROTONIX) 40 mg tablet TAKE 1 TABLET BY MOUTH DAILY   ??? ferrous sulfate (IRON) 325 mg (65 mg iron) EC tablet Take 1 Tab by mouth daily.   ??? gabapentin (NEURONTIN) 300 mg capsule Take 1,200 mg by mouth daily.   ??? acetaminophen (ACETAMINOPHEN EXTRA STRENGTH) 500 mg tablet Take 1,000 mg by mouth every six (6) hours. With tramadol   ??? nitroglycerin (NITROSTAT) 0.4 mg SL tablet 1 Tab by SubLINGual route every five (5) minutes as needed for Chest Pain for up to 3 doses.   ??? bumetanide (BUMEX) 2 mg tablet Take 2 mg by mouth daily.   ??? nabumetone (RELAFEN) 500 mg tablet TAKE 1 TAB BY MOUTH TWO (2) TIMES A DAY.   ??? DULoxetine (CYMBALTA) 60 mg capsule TAKE 1 CAPSULE BY MOUTH DAILY     No current facility-administered medications for this visit.        Telford Nab, MD  [facility] [place], [address]   [place], [place]  [phone]

## 2017-09-23 NOTE — Patient Instructions (Addendum)
Please reduce your coreg dose to 12.5mg  twice daily (1/2 of a 25mg  tablet)  Please check your blood pressure daily (at least one hour after your morning blood pressure medications.)  Keep a written record of your blood pressure readings and bring the readings to your next appointment.    Please elevate your legs at least 2 hours/day  Please increase your exercise as much as possible - at least 20 minutes/day     Please have fasting labs drawn in the next few days    Please follow up with sleep medicine about your sleep apnea

## 2017-09-24 MED ORDER — DULOXETINE 60 MG CAP, DELAYED RELEASE
60 mg | ORAL_CAPSULE | ORAL | 2 refills | Status: DC
Start: 2017-09-24 — End: 2018-06-22

## 2017-09-25 LAB — METABOLIC PANEL, COMPREHENSIVE
A-G Ratio: 2.5 — ABNORMAL HIGH (ref 1.2–2.2)
ALT (SGPT): 13 IU/L (ref 0–32)
AST (SGOT): 14 IU/L (ref 0–40)
Albumin: 4.3 g/dL (ref 3.6–4.8)
Alk. phosphatase: 59 IU/L (ref 39–117)
BUN/Creatinine ratio: 17 (ref 12–28)
BUN: 16 mg/dL (ref 8–27)
Bilirubin, total: <0.2 mg/dL (ref 0.0–1.2)
CO2: 24 mmol/L (ref 20–29)
Calcium: 9.7 mg/dL (ref 8.7–10.3)
Chloride: 96 mmol/L (ref 96–106)
Creatinine: 0.94 mg/dL (ref 0.57–1.00)
GFR est AA: 73 mL/min/{1.73_m2} (ref >59)
GFR est non-AA: 63 mL/min/{1.73_m2} (ref >59)
GLOBULIN, TOTAL: 1.7 g/dL (ref 1.5–4.5)
Glucose: 131 mg/dL — ABNORMAL HIGH (ref 65–99)
Potassium: 5.4 mmol/L — ABNORMAL HIGH (ref 3.5–5.2)
Protein, total: 6 g/dL (ref 6.0–8.5)
Sodium: 136 mmol/L (ref 134–144)

## 2017-09-25 LAB — MAGNESIUM: Magnesium: 1.4 mg/dL — ABNORMAL LOW (ref 1.6–2.3)

## 2017-09-25 LAB — NMR LIPOPROFILE WITH LIPIDS (WITHOUT GRAPH)
Cholesterol, Total: 122 mg/dL (ref 100–199)
HDL-C: 51 mg/dL (ref >39)
HDL-P (Total): 32.2 umol/L (ref >=30.5)
LDL size: 20.7 nm (ref >20.5)
LDL-C: 44 mg/dL (ref 0–99)
LDL-P: 764 nmol/L (ref <1000)
LP-IR SCORE: 62 — ABNORMAL HIGH (ref <=45)
Small LDL-P: 338 nmol/L (ref <=527)
Triglycerides: 136 mg/dL (ref 0–149)

## 2017-09-25 LAB — CVD REPORT

## 2017-09-25 LAB — DIABETES PATIENT EDUCATION

## 2017-09-25 LAB — NT-PRO BNP: PROBNP: 349 pg/mL — ABNORMAL HIGH (ref 0–301)

## 2017-09-27 ENCOUNTER — Encounter

## 2017-09-27 MED ORDER — NABUMETONE 500 MG TAB
500 mg | ORAL_TABLET | ORAL | 1 refills | Status: DC
Start: 2017-09-27 — End: 2017-12-23

## 2017-09-28 ENCOUNTER — Telehealth

## 2017-09-28 ENCOUNTER — Encounter: Attending: Internal Medicine | Primary: Internal Medicine

## 2017-09-28 NOTE — Telephone Encounter (Addendum)
-----   Message from Renita Papa, NP sent at 09/28/2017  9:07 AM EDT -----  Lipids look great,  K a little high - please advise her to cut back on high K foods, Mg is pretty low - please ask her to begin taking Mg Oxide 400mg  daily.  Will need to recheck BMP and magnesium level again in 1-2 months.    LVM for patient to return call at earliest convenience.    Returned patient's call, 2 pt identifiers used  Above lab results given.    Requested Prescriptions     Signed Prescriptions Disp Refills   ??? magnesium oxide (MAG-OX) 400 mg tablet 90 Tab 3     Sig: Take 1 Tab by mouth daily.     Authorizing Provider: Marcellus Scott     Ordering User: Cori Razor     Lab slip mailed to patient.

## 2017-09-28 NOTE — Progress Notes (Signed)
Lipids look great,  K a little high - please advise her to cut back on high K foods, Mg is pretty low - please ask her to begin taking Mg Oxide 400mg  daily.  Will need to recheck BMP and magnesium level again in 1-2 months.

## 2017-09-29 MED ORDER — MAGNESIUM OXIDE 400 MG TAB
400 mg | ORAL_TABLET | Freq: Every day | ORAL | 3 refills | Status: DC
Start: 2017-09-29 — End: 2019-03-31

## 2017-09-29 NOTE — Telephone Encounter (Signed)
Pt is returning nurses phone call.   Phone# [phone]

## 2017-10-04 ENCOUNTER — Encounter

## 2017-10-04 MED ORDER — PRAVASTATIN 40 MG TAB
40 mg | ORAL_TABLET | ORAL | 3 refills | Status: DC
Start: 2017-10-04 — End: 2018-02-22

## 2017-10-06 ENCOUNTER — Institutional Professional Consult (permissible substitution): Admit: 2017-10-06 | Discharge: 2017-10-06 | Payer: MEDICARE | Primary: Internal Medicine

## 2017-10-06 DIAGNOSIS — I6523 Occlusion and stenosis of bilateral carotid arteries: Secondary | ICD-10-CM

## 2017-10-06 DIAGNOSIS — I7789 Other specified disorders of arteries and arterioles: Secondary | ICD-10-CM

## 2017-10-06 LAB — ECHOCARDIOGRAM COMPLETE 2D W DOPPLER W COLOR: Left Ventricular Ejection Fraction: 60

## 2017-10-06 NOTE — Progress Notes (Signed)
In [employer]

## 2017-10-06 NOTE — Progress Notes (Signed)
Results given during clinic 10/15/17

## 2017-10-06 NOTE — Progress Notes (Signed)
stable °

## 2017-10-08 NOTE — Procedures (Signed)
[facility]  *** FINAL REPORT ***    Name: Kelsey [name], Kelsey [name]  MRN: [id number]       Outpatient  DOB: 09 Dec 1951  HIS Order #: [id number]  TRAKnet Visit #: [id number]  Date: 06 Oct 2017    TYPE OF TEST: Aorto-Iliac Duplex    REASON FOR TEST  Enlarged aorta on clinical exam    B-Mode:-                 (cm)   1     2     3   Aortic diameter:         AP:     1.6   1.8   1.6                           TV:     1.8   1.9   1.8  Common iliac diameter:   Right: 0.90                           Left:  0.70    Duplex:-                           PSV  Stenosis                           ----- --------------------  Aorta: (1)                42.0 Normal         (2)                46.0         (3)                61.0    Right common iliac:      130.0 > 50% stenosis  Right external iliac:    Left common iliac:       167.0 > 50% stenosis  Left external iliac:    INTERPRETATION/FINDINGS  PROCEDURE:  Grayscale, color flow and spectral Doppler analysis of the   abdominal aorta and proximal common iliac arteries.    FINDINGS:  The abdominal aorta appears normal in size, exhibiting a  maximal dimension of 1.9 centimeters.  There are no focal regions of  dilatation noted.  Mild atherosclerosis is present.  The proximal  common iliac arteries appear free of aneurysmal disease, but exhibit  mildly increased velocities.    IMPRESSION:  1)  No evidence of aneurysmal dilatation within the abdominal aorta or   proximal common iliac arteries.  2)  Mild atherosclerosis is present within the the aorta and common  iliac arteries.  3)  Velocity measurements suggest the presence of >50% stenosis within   the bilateral common iliac arteries. Arteries are not well  visualized.  4)  In comparison with a previous study dated 02/06/2017,  iliac artery   velocities have risen slightly.  Otherwise, no interval change.    ADDITIONAL COMMENTS    I have personally reviewed the data relevant to the interpretation of  this  study.    TECHNOLOGIST:  Laural Roesarol Miranda, RVT, RDMS, RDCS  Signed: 10/06/2017 02:39 PM    PHYSICIAN: Caleen Jobshristine M. Dahlia ClientBrowning, MD  Signed: 10/08/2017 01:28 PM

## 2017-10-08 NOTE — Procedures (Signed)
[facility]  *** FINAL REPORT ***    Name: Kelsey [name], Kelsey [name]  MRN: [id number]       Outpatient  DOB: 09 Dec 1951  HIS Order #: [id number]  TRAKnet Visit #: [id number]  Date: 06 Oct 2017    TYPE OF TEST: Cerebrovascular Duplex    REASON FOR TEST  Known carotid stenosis    Right Carotid:-             Proximal               Mid                 Distal  cm/s  Systolic  Diastolic  Systolic  Diastolic  Systolic  Diastolic  CCA:     57.869.0      19.0                            81.0      20.0  Bulb:  ECA:     48.0       9.0  ICA:    218.0      37.0      177.0      49.0       99.0      31.0  ICA/CCA:  2.7       1.9    ICA Stenosis: 50-69%    Right Vertebral:-  Finding: Antegrade  Sys:       55.0  Dia:       18.0    Right Subclavian:    Left Carotid:-            Proximal                Mid                 Distal  cm/s  Systolic  Diastolic  Systolic  Diastolic  Systolic  Diastolic  CCA:     46.980.0      19.0                            67.0      20.0  Bulb:  ECA:    195.0      12.0  ICA:    153.0      37.0      105.0      31.0       88.0      29.0  ICA/CCA:  2.3       1.9    ICA Stenosis: <50%    Left Vertebral:-  Finding:  Sys:  Dia:    Left Subclavian:    INTERPRETATION/FINDINGS  PROCEDURE:  Evaluation of the extracranial cerebrovascular arteries  with ultrasound (Grayscale imaging, pulsed Doppler color Doppler).  Includes the common carotid, internal carotid, external carotid and  vertebral arteries.    FINDINGS:  Mild heterogeneous plaque is present within the common  carotid artery bilaterally. Moderate heterogeneous plaque is present  within the bilateral bifurcation, bilateral proximal internal carotid  artery and left external carotid artery.  Color flow exhibits moderate   filling defects and peak velocities are elevated bilaterally.    IMPRESSION:  1)  50-79% stenosis of the right internal carotid artery.  2)  50-79% stenosis of the left internal carotid artery (lower end of  range).  3)  Approximately 50%  stenosis of the left external  carotid artery by  velocity criteria.  4)  Vertebral arteries are patent with antegrade flow.  5)  In comparison with a previous study dated 07/22/2016, there has been   progression of disease within the left internal carotid artery.    ADDITIONAL COMMENTS    I have personally reviewed the data relevant to the interpretation of  this  study.    TECHNOLOGIST: Laural Roes, RVT, RDMS, RDCS  Signed: 10/06/2017 02:51 PM    PHYSICIAN: Caleen Jobs. Dahlia Client, MD  Signed: 10/08/2017 01:49 PM

## 2017-10-08 NOTE — Procedures (Signed)
[facility]  *** FINAL REPORT ***    Name: Kelsey [name], Kelsey [name]  MRN: [id number]       Outpatient  DOB: 09 Dec 1951  HIS Order #: [id number]  TRAKnet Visit #: [id number]  Date: 06 Oct 2017    TYPE OF TEST: Cerebrovascular Duplex    REASON FOR TEST  Known carotid stenosis    Right Carotid:-             Proximal               Mid                 Distal  cm/s  Systolic  Diastolic  Systolic  Diastolic  Systolic  Diastolic  CCA:     72.569.0      19.0                            81.0      20.0  Bulb:  ECA:     48.0       9.0  ICA:    218.0      37.0      177.0      49.0       99.0      31.0  ICA/CCA:  2.7       1.9    ICA Stenosis: 50-69%    Right Vertebral:-  Finding: Antegrade  Sys:       55.0  Dia:       18.0    Right Subclavian:    Left Carotid:-            Proximal                Mid                 Distal  cm/s  Systolic  Diastolic  Systolic  Diastolic  Systolic  Diastolic  CCA:     36.680.0      19.0                            67.0      20.0  Bulb:  ECA:    195.0      12.0  ICA:    153.0      37.0      105.0      31.0       88.0      29.0  ICA/CCA:  2.3       1.9    ICA Stenosis: <50%    Left Vertebral:-  Finding:  Sys:  Dia:    Left Subclavian:    INTERPRETATION/FINDINGS  PROCEDURE:  Evaluation of the extracranial cerebrovascular arteries  with ultrasound (Grayscale imaging, pulsed Doppler color Doppler).  Includes the common carotid, internal carotid, external carotid and  vertebral arteries.    FINDINGS:  Mild heterogeneous plaque is present within the common  carotid artery bilaterally. Moderate heterogeneous plaque is present  within the bilateral bifurcation, bilateral proximal internal carotid  artery and left external carotid artery.  Color flow exhibits moderate   filling defects and peak velocities are elevated bilaterally.    IMPRESSION:  1)  50-79% stenosis of the right internal carotid artery.  2)  50-79% stenosis of the left internal carotid artery (lower end of  range).   3)  Approximately 50% stenosis of the left external  carotid artery by  velocity criteria.  4)  Vertebral arteries are patent with antegrade flow.  5)  In comparison with a previous study dated 07/22/2016, there has been   progression of disease within the left internal carotid artery.    ADDITIONAL COMMENTS    I have personally reviewed the data relevant to the interpretation of  this  study.    TECHNOLOGIST: Laural Roes, RVT, RDMS, RDCS  Signed: 10/06/2017 02:51 PM    PHYSICIAN: Caleen Jobs. Dahlia Client, MD  Signed: 10/08/2017 01:49 PM

## 2017-10-08 NOTE — Procedures (Signed)
[facility]  *** FINAL REPORT ***    Name: [name], Porsche  MRN: [id number]       Outpatient  DOB: 09 Dec 1951  HIS Order #: [id number]  TRAKnet Visit #: [id number]  Date: 06 Oct 2017    TYPE OF TEST: Aorto-Iliac Duplex    REASON FOR TEST  Enlarged aorta on clinical exam    B-Mode:-                 (cm)   1     2     3   Aortic diameter:         AP:     1.6   1.8   1.6                           TV:     1.8   1.9   1.8  Common iliac diameter:   Right: 0.90                           Left:  0.70    Duplex:-                           PSV  Stenosis                           ----- --------------------  Aorta: (1)                42.0 Normal         (2)                46.0         (3)                61.0    Right common iliac:      130.0 > 50% stenosis  Right external iliac:    Left common iliac:       167.0 > 50% stenosis  Left external iliac:    INTERPRETATION/FINDINGS  PROCEDURE:  Grayscale, color flow and spectral Doppler analysis of the   abdominal aorta and proximal common iliac arteries.    FINDINGS:  The abdominal aorta appears normal in size, exhibiting a  maximal dimension of 1.9 centimeters.  There are no focal regions of  dilatation noted.  Mild atherosclerosis is present.  The proximal  common iliac arteries appear free of aneurysmal disease, but exhibit  mildly increased velocities.    IMPRESSION:  1)  No evidence of aneurysmal dilatation within the abdominal aorta or   proximal common iliac arteries.  2)  Mild atherosclerosis is present within the the aorta and common  iliac arteries.  3)  Velocity measurements suggest the presence of >50% stenosis within   the bilateral common iliac arteries. Arteries are not well  visualized.  4)  In comparison with a previous study dated 02/06/2017,  iliac artery   velocities have risen slightly.  Otherwise, no interval change.    ADDITIONAL COMMENTS    I have personally reviewed the data relevant to the interpretation of  this  study.     TECHNOLOGIST: Laural Roesarol Miranda, RVT, RDMS, RDCS  Signed: 10/06/2017 02:39 PM    PHYSICIAN: Caleen Jobshristine M. Dahlia ClientBrowning, MD  Signed: 10/08/2017 01:28 PM

## 2017-10-15 ENCOUNTER — Ambulatory Visit: Admit: 2017-10-15 | Discharge: 2017-10-15 | Payer: MEDICARE | Attending: Internal Medicine | Primary: Internal Medicine

## 2017-10-15 DIAGNOSIS — I2510 Atherosclerotic heart disease of native coronary artery without angina pectoris: Secondary | ICD-10-CM

## 2017-10-15 NOTE — Progress Notes (Signed)
[facility]  [id number]    HPI: Kelsey [name], a 66 y.o. year-old who presents for follow up regarding her CAD.    She is having more trouble with both calves hurting her, any walking makes them cramp. She.does have stents in the legs and we discussed whether they are getting clogged up again. She did see Dr. Kathrin [name] years ago after an angiogram by someone else resulted in an embolus to the right little toe.     In the last 1.5 weeks she has been having more shortness of breath with stairs, leaning over, with minimal activity. It is not consistent, but does happen with exertion. Going up the steps she notices it more. Worse in the last month.   Dizziness is better now since cutting down on the coreg at her last visit.  medication. But still there.   Not checking her glucose but a1C looking good.     Mild edema. Less active since her hip replacement earlier this year.   Not using CPAP, due to reassess with home sleep study.  Discussed the importance of treatment of her OSA   Dr. Fanny [name] handles pain management     She has complex vascular disease and multiple issues we reviewed today.   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 Referral to Dr. Elon [name] for new vascular opinion.   Progressive DOE, concerning for CAD progression, with complex CAD and CTO, last cath 8/17 no targets for intervention, will as Dr. Leontine [name] to review films  Progressive dizziness somewhat better after cutting coreg but still there.  Has had progression of carotid plaque, will as Vascular to review. CTA neck to look at this in more detail.       Historic  She was hospitalized at [facility] on 10/15/16 when she presented to ER with elevated BP, jaw/throat/facial tightness and dyspnea  She had just been started losartan on 10/14/16 for elevated BP   Her troponin bumped to 0.71 and she underwent another cardiac cath which did not show any new CAD, no PCI performed  She was discharged but then came back to ER with right groin hematoma and acute anemia with Hgb 6.9, hypotension and dyspnea  She received 2 units of PRBCs, groin [place] was negative, right leg venous duplex was negative and she was discharged on 10/21/16 and her Hgb was 9.2 at the time  Her angina equivalent was jaw pain in the past  Has balance issues, on gabapentin for neuropathy, walks with a cane  Still smoking - on Chantix right now, when she comes off it she smokes more   Has had short term memory issues since her CABG/AVR  Difficulty with word finding, cognitive abilities are more limited  Has a hard time remembering people's names  Advised to follow back up with neurology previously     Good friend of Kelsey [name] who passed away, former patient here  Her wife is here with her today    Assessment/Plan:  1.  CAD - hx of MI and PCI x 3, had 3 vessel CABG 06/08/2012 by Dr. Myles [name], s/p PCI/DES to diagonal 08/29/16, no progression of CAD on cardiac cath 10/17  -continue ASA, Plavix, Coreg and statin  -has Rx for NTG SL tabs PRN chest pain  2.  Severe AS - s/p bioprosthetic bovine AVR 06/08/2012, stable by TTE 10/18   3.  Carotid  stenosis - bilateral, progressive, on ASA and statin, get CTA  4.  PAD s/p stent in distal right SFA - concern for progression, more claudication, needs vascular eval and CTA with runoff  5.  Dyslipidemia - at goal in 4/18, on zetia and pravastatin 19m daily, had myalgias on fenofibrate  -NMR at goal  6.  DM Type 2 - Hgb A1c 6.o0, on insulin doing well  7.  HTN - hypotensive, last visit, cut down on coreg 12.560mBID and now bp is better but still has dyspnea, fatigue, claudication and dizziness  8.  Chronic pain/arthritis - s/p hip surgery Kelsey [name]/orthopedics  9.  Vitamin D deficiency - on Vitamin D 1000 units daily   10.  RBBB - chronic  11.  Hyponatremia - chronic, stable   12.  Anemia - stable  13.  Enlarged aorta on exam - will check aortic duplex, + atherosclerosis   14.  Dyspnea/edema - TTE looked ok, have Dr. kaLeontine Locketeview films, may consider cath for cad progression    Echo 10/18  Aortic duplex 2/18 - aorta normal in size, no aneurysm  ABI 2/18 - right 0.6, left 0.7    Cardiac Cath 10/17 -LCA:  LAD: 30% proximal after large D1, with competitive inflow from LIMA to mid LAD, D1 recently stented widely patent, LCX: 80% after small MOM, before large PLOM which has competitive SVG inflow, RCA: occluded mid, LIMA --> LAD widely patent unobstructed anasomosis, SVG --> PLOM widely patent, SVG --> LVBr of distal RCA widely patent, moderate disease proximal to SVG insertion, yielding ischemic potential in small PDA (similar to cath 6 weeks ago, not fixable and VERY unlikely to be problematic).  IMP: CAD post PCI, CABG with very limited ischemic potential  Cardiac Cath 9/17 - Patent LIMA to LAD, SVG's to RCA and LCX.  Very large diagonal has eccentric 90% stenosis, treated with 2.75 x 12 Xience DES good result.  Bioprosthetic AV not crossed given recent echo.  Echo 8/17 - LVEF 55-60%, no WMA, wall thickness was increased, dilated LA, atrial septum thickened, mildly dilated RA, mild MR  Carotid Duplex 8/17 -  50-79% R ICA stenosis, 10-49% stenosis of the L ICA, >50% stenosis of the left ECA  ABI 12/16 - right 0.82 and left 0.88  Echo 6/16 - LVEF 55%, no WMA, bovine AVR with normal function, no AI, mild AS, no pericardial effusion, grd 1 dd, MAC with calcified chords noted with mild sclerosis, mild MR, trace TR  Carotid duplex 6/16 - right 50-69% prox ICA stenosis with moderate hemodynamic compromise, left 1-49% prox ICA stenosis with mild hemodynamic compromise, no change compared to 2014  ABI 12/15 - right 0.70 and left 0.88  Peripheral angiogram 5/15 - stent in distal right SFA, stent in left common iliac artery  ABI 5/15 - right 0.68 and left 0.88   Abd CTA 5/15 - significant PVD, 80% proximal stenosis in celiac plexus, abdominal aorta with diffuse calcification and mural thrombus, mild disease in right and left renal arteries, moderate right iliac disease with CFA stenosis, SFA with moderate disease, left common iliac with ulcerated plaque proximally, moderate to severe disease in iliac system, SFA with moderate to severe diffuse disease  Echo 09/2012 - LVEF 45-50%, AVR ok, mild MR, mild TR  06/08/12 -CABG x 3 (LIMA to LAD, SVG to OM1, SVG to PLB) [facility] - mod AS (3049mradient), mod AI, diffuse 3  vessel CAD  Cardiac Cath 05/2011 - LVEF 30%, occluded mid RCA, high grade stenosis distal LCx, mild AS s/p PCI/DES to LCx by Dr. Currie Paris  Cardiac Cath 02/2009 - patent stents in RCA and LCx  PCI to LCx 2001  MI/PCI to RCA 2000    Soc Hx: smokes 1/4 ppd, drinks 2 glasses of wine/month, no drug use  Fam Hx: father had CVA at age 45, had a couple of MIs in his 45s and had arteries replaced with teflon, mother passed at age 41 from aortic aneurysm, brother passed age 72 from sepsis and had bad PVD with multiple bypass surgeries    She  has a past medical history of Acute MI ([place]), Arthritis, CAD (coronary artery disease), Cervical spondylolysis, Chronic pain, COPD, Diabetes ([place]), GERD (gastroesophageal reflux disease), Goiter, Hypertension, and Neuropathy.    Cardiovascular ROS: no chest pain, positive for dyspnea, edema, dizziness and fatigue   Respiratory ROS: no cough or wheezing  Neurological ROS: no TIA or stroke symptoms  All other systems negative except as above.     PE  Vitals:    10/15/17 0903   BP: 130/80   Pulse: 66   Resp: 16   Weight: 207 lb (93.9 kg)   Height: 5' 4"  (1.626 m)    Body mass index is 35.53 kg/m??.   General appearance - alert, well appearing, and in no distress  Mental status - affect appropriate to mood  Eyes - sclera anicteric, moist mucous membranes   Neck - supple, bilateral carotid bruits   Lymphatics - not assessed  Chest - clear to auscultation, no wheezes, rales or rhonchi  Heart - normal rate, regular rhythm, normal S1, S2, 3/6 SEM    Abdomen - soft, nontender, nondistended, aorta feels enlarged   Back exam - full range of motion, no tenderness  Neurological - cranial nerves II through XII grossly intact, no focal deficit  Musculoskeletal - no muscular tenderness noted, normal strength  Extremities - peripheral pulses diminished, 1+ LE edema (left > right)    Skin - normal coloration  no rashes    12 lead ECG: NSR with RBBB, first degree AV block, non-specific ST-T wave changes     Recent Labs:  Lab Results   Component Value Date/Time    Cholesterol, total 90 10/16/2016 03:36 AM    Cholesterol, Total 122 09/23/2017 12:13 PM    HDL Cholesterol 46 10/16/2016 03:36 AM    LDL, calculated 18.2 10/16/2016 03:36 AM    Triglyceride 129 10/16/2016 03:36 AM    CHOL/HDL Ratio 2.0 10/16/2016 03:36 AM     Lab Results   Component Value Date/Time    Creatinine 0.94 09/23/2017 12:13 PM     Lab Results   Component Value Date/Time    BUN 16 09/23/2017 12:13 PM     Lab Results   Component Value Date/Time    Potassium 5.4 (H) 09/23/2017 12:13 PM     No results found for: HBA1C, HGBE8, HBA1CEXT, HBA1CEXT  Lab Results   Component Value Date/Time    HGB 9.2 (L) 10/21/2016 02:07 PM     Lab Results   Component Value Date/Time    PLATELET 185 10/21/2016 06:40 AM       Reviewed:  Past Medical History:   Diagnosis Date   ??? Acute MI ([place])     x3   ??? Arthritis    ??? CAD (coronary artery disease)    ??? Cervical spondylolysis    ??? Chronic pain    ???  COPD    ??? Diabetes ([place])    ??? GERD (gastroesophageal reflux disease)    ??? Goiter 04/20/2012   ??? Hypertension    ??? Neuropathy 04/06/2013     Social History     Tobacco Use   Smoking Status Current Every Day Smoker   ??? Packs/day: 0.30   ??? Years: 45.00   ??? Pack years: 13.50   ??? Types: Cigarettes   Smokeless Tobacco Never Used   Tobacco Comment     passed ready     Social History     Substance and Sexual Activity   Alcohol Use Yes   ??? Frequency: Monthly or less   ??? Drinks per session: 1 or 2   ??? Binge frequency: Never    Comment: may be 3 times a year if that     Allergies   Allergen Reactions   ??? Morphine Anaphylaxis     Tolerates oxycodone without problem   ??? Avalide [Irbesartan-Hydrochlorothiazide] Myalgia   ??? Betadine [Povidone-Iodine] Rash   ??? Demerol [Meperidine] Other (comments)     Makes her feel crazy   ??? Pcn [Penicillins] Swelling   ??? Prinivil [Lisinopril] Cough   ??? Statins-Hmg-Coa Reductase Inhibitors Myalgia   ??? Sulfa (Sulfonamide Antibiotics) Unknown (comments)   ??? Tricor [Fenofibrate Micronized] Myalgia       Current Outpatient Medications   Medication Sig   ??? pravastatin (PRAVACHOL) 40 mg tablet TAKE 1 TAB BY MOUTH NIGHTLY.   ??? magnesium oxide (MAG-OX) 400 mg tablet Take 1 Tab by mouth daily.   ??? nabumetone (RELAFEN) 500 mg tablet TAKE 1 TAB BY MOUTH TWO (2) TIMES A DAY.   ??? DULoxetine (CYMBALTA) 60 mg capsule TAKE 1 CAPSULE BY MOUTH DAILY   ??? insulin lispro (HUMALOG U-100 INSULIN) 100 unit/mL injection by SubCUTAneous route. 0-8 units three times a day as needed   ??? carvedilol (COREG) 12.5 mg tablet Take 1 Tab by mouth two (2) times a day.   ??? VESICARE 10 mg tablet TAKE 1 TABLET BY MOUTH DAILY.   ??? clopidogrel (PLAVIX) 75 mg tab TAKE 1 TABLET BY MOUTH DAILY   ??? losartan (COZAAR) 25 mg tablet TAKE 1 TAB BY MOUTH DAILY.   ??? VITAMIN D3 2,000 unit cap capsule TAKE ONE CAPSULE BY MOUTH DAILY   ??? aspirin delayed-release 81 mg tablet TAKE 1 TAB BY MOUTH DAILY.   ??? metFORMIN ER (GLUCOPHAGE XR) 500 mg tablet TAKE 2 TABLETS BY MOUTH IN THE MORNING WITH BREAKFAST AND 3 TABLETS BY MOUTH AT BEDTIME   ??? ezetimibe (ZETIA) 10 mg tablet TAKE 1 TAB BY MOUTH NIGHTLY.   ??? LEVEMIR FLEXTOUCH U-100 INSULN 100 unit/mL (3 mL) inpn INJECT 34 UNITS SUBCUTANEOUSLY AT BEDTIME DX: 250.00   ??? CHANTIX CONTINUING MONTH BOX 1 mg tablet TAKE 1 TABLET BY MOUTH TWICE A DAY    ??? pantoprazole (PROTONIX) 40 mg tablet TAKE 1 TABLET BY MOUTH DAILY   ??? ferrous sulfate (IRON) 325 mg (65 mg iron) EC tablet Take 1 Tab by mouth daily.   ??? gabapentin (NEURONTIN) 300 mg capsule Take 1,200 mg by mouth daily.   ??? acetaminophen (ACETAMINOPHEN EXTRA STRENGTH) 500 mg tablet Take 1,000 mg by mouth every six (6) hours. With tramadol   ??? nitroglycerin (NITROSTAT) 0.4 mg SL tablet 1 Tab by SubLINGual route every five (5) minutes as needed for Chest Pain for up to 3 doses.   ??? bumetanide (BUMEX) 2 mg tablet Take 2 mg by mouth daily.  No current facility-administered medications for this visit.        Telford Nab, MD  [facility], [address]  [place], [place]  [phone]

## 2017-10-22 ENCOUNTER — Encounter

## 2017-10-22 ENCOUNTER — Inpatient Hospital Stay: Admit: 2017-10-22 | Payer: MEDICARE | Attending: Internal Medicine | Primary: Internal Medicine

## 2017-10-22 DIAGNOSIS — I739 Peripheral vascular disease, unspecified: Secondary | ICD-10-CM

## 2017-10-22 MED ORDER — SODIUM CHLORIDE 0.9 % IJ SYRG
Freq: Once | INTRAMUSCULAR | Status: AC
Start: 2017-10-22 — End: 2017-10-22
  Administered 2017-10-22: 19:00:00 via INTRAVENOUS

## 2017-10-22 MED ORDER — IOPAMIDOL 76 % IV SOLN
370 mg iodine /mL (76 %) | Freq: Once | INTRAVENOUS | Status: AC
Start: 2017-10-22 — End: 2017-10-22
  Administered 2017-10-22: 19:00:00 via INTRAVENOUS

## 2017-10-22 MED ORDER — SODIUM CHLORIDE 0.9% BOLUS IV
0.9 % | Freq: Once | INTRAVENOUS | Status: AC
Start: 2017-10-22 — End: 2017-10-22
  Administered 2017-10-22: 19:00:00 via INTRAVENOUS

## 2017-10-22 MED FILL — SODIUM CHLORIDE 0.9 % IV: INTRAVENOUS | Qty: 200

## 2017-10-22 MED FILL — ISOVUE-370  76 % INTRAVENOUS SOLUTION: 370 mg iodine /mL (76 %) | INTRAVENOUS | Qty: 200

## 2017-10-22 MED FILL — NORMAL SALINE FLUSH 0.9 % INJECTION SYRINGE: INTRAMUSCULAR | Qty: 10

## 2017-10-22 NOTE — Telephone Encounter (Signed)
Returned patient's call, 2 pt identifiers used  Advised patient she does need to be NPO prior to her CTA scans scheduled.

## 2017-10-22 NOTE — Telephone Encounter (Signed)
Pt called to discuss Cat Scan instructions prior to her procedure.  Phone # [phone]  Thanks

## 2017-11-02 MED ORDER — CLOPIDOGREL 75 MG TAB
75 mg | ORAL_TABLET | ORAL | 5 refills | Status: DC
Start: 2017-11-02 — End: 2018-05-27

## 2017-11-02 NOTE — Telephone Encounter (Signed)
Requested Prescriptions     Signed Prescriptions Disp Refills   ??? clopidogrel (PLAVIX) 75 mg tab 30 Tab 5     Sig: TAKE 1 TABLET BY MOUTH DAILY     Authorizing Provider: Tollie EthWARNER, MARK F     Ordering User: Derrill MemoIRBY, Maelynn Moroney L       Per Dr. Sheliah HatchWarner Verbal Order     Future Appointments   Date Time Provider Department Center   11/18/2017  9:00 AM Marcellus ScottBrowning, Christine M, MD [facility] SCHED   12/01/2017  1:30 PM Pahle, Davonna [facility]ellingNancy J, [facility]

## 2017-11-18 ENCOUNTER — Ambulatory Visit: Admit: 2017-11-18 | Discharge: 2017-11-18 | Payer: MEDICARE | Attending: Internal Medicine | Primary: Internal Medicine

## 2017-11-18 DIAGNOSIS — I2510 Atherosclerotic heart disease of native coronary artery without angina pectoris: Secondary | ICD-10-CM

## 2017-11-18 NOTE — Patient Instructions (Addendum)
Patient may hold Plavix and ASA

## 2017-11-18 NOTE — Progress Notes (Signed)
[facility]  [phone]    HPI: Kelsey [name], a 66 y.o. year-old who presents for follow up regarding her CAD.    She is having some shortness of breath but it is stable. Not exercising much due to the hip pain. She will be active and get out of breath. No chest pain. She continues to have dizziness. She is very fatigued and weak. EF is preserved. She is much weaker than she used to be. She does have a 100% RCA which may contribute but it is difficult to know how much is coronary.   She is not feeling as bad as she was at her last visit so I am inclined to leave her be for surgery. She is getting a little more exercise. She is walking more and has to stop due to left leg claudication and she thinks that the breathing is acutally doing a little bit better.   Carotid CEA was planned in 12/10 now will be stent.   Discussed her extensive plaque in the arteries of the body. .    Stable to proceed with upcoming surgery on 12/10. No progressive cad sx to warrant pci of cto RCA at this time.     She is having more trouble with both calves hurting her, any walking makes them cramp. She.does have stents in the legs and we discussed whether they are getting clogged up again. Sh will talk to Dr. [name] about it again after the carotid surgery.  She did see Dr. Kathrin Penner years ago after an angiogram by someone else resulted in an embolus to the right little toe.     Last visit worse dizziness and dyspnea. Dyspnea has stabilized and getting better, dizziness better on lower coreg. Also has vertebral artery stenosis by MRA.   Not checking her glucose but a1C looking good.     Mild edema. Less active since her hip replacement earlier this year.   Not using CPAP, due to reassess with home sleep study.  Discussed the importance of treatment of her OSA   Dr. Fanny Skates handles pain management     She has complex vascular disease and multiple issues we reviewed today.    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 Referral to Dr. [name] for new vascular opinion.   Progressive DOE, concerning for CAD progression, with complex CAD and CTO, last cath 8/17 no targets for intervention, will as Dr. [name] to review films  Progressive dizziness somewhat better after cutting coreg but still there.  Has had progression of carotid plaque, will as Vascular to review. CTA neck to look at this in more detail.       Historic  She was hospitalized at [facility] on 10/15/16 when she presented to ER with elevated BP, jaw/throat/facial tightness and dyspnea  She had just been started losartan on 10/14/16 for elevated BP  Her troponin bumped to 0.71 and she underwent another cardiac cath which did not show any new CAD, no PCI performed  She was discharged but then came back to ER with right groin hematoma and acute anemia with Hgb 6.9, hypotension and dyspnea  She received 2 units of PRBCs, groin [place] was negative, right leg venous duplex was negative and she was discharged on 10/21/16 and her Hgb was 9.2 at the time  Her angina equivalent was jaw pain in the past  Has balance issues,  on gabapentin for neuropathy, walks with a cane  Still smoking - on Chantix right now, when she comes off it she smokes more   Has had short term memory issues since her CABG/AVR  Difficulty with word finding, cognitive abilities are more limited  Has a hard time remembering people's names  Advised to follow back up with neurology previously     Good friend of [name] who passed away, former patient here  Her wife is here with her today    Assessment/Plan:  1.  CAD - hx of MI and PCI x 3, had 3 vessel CABG 06/08/2012 by Dr. Myles Gip, s/p PCI/DES to diagonal 08/29/16, no progression of CAD on cardiac cath 10/17  -continue ASA, Plavix, Coreg and statin  -has Rx for NTG SL tabs PRN chest pain   2.  Severe AS - s/p bioprosthetic bovine AVR 06/08/2012, stable by TTE 10/18   3.  Carotid stenosis - bilateral, progressive, on ASA and statin, get CTA  4.  PAD s/p stent in distal right SFA - concern for progression, more claudication, needs vascular eval and CTA with runoff  5.  Dyslipidemia - at goal in 4/18, on zetia and pravastatin 31m daily, had myalgias on fenofibrate  -NMR at goal  6.  DM Type 2 - Hgb A1c 6.o0, on insulin doing well  7.  HTN - hypotensive, last visit, cut down on coreg 12.567mBID and now bp is better but still has dyspnea, fatigue, claudication and dizziness  8.  Chronic pain/arthritis - s/p hip surgery Dr. Kump/orthopedics  9.  Vitamin D deficiency - on Vitamin D 1000 units daily   10.  RBBB - chronic  11.  Hyponatremia - chronic, stable  12.  Anemia - stable  13.  Enlarged aorta on exam - will check aortic duplex, + atherosclerosis   14.  Dyspnea/edema - TTE looked ok, have Dr. kaLeontine Locketeview films, may consider cath for cad progression    Echo 10/18  Aortic duplex 2/18 - aorta normal in size, no aneurysm  ABI 2/18 - right 0.6, left 0.7    Cardiac Cath 10/17 -LCA:  LAD: 30% proximal after large D1, with competitive inflow from LIMA to mid LAD, D1 recently stented widely patent, LCX: 80% after small MOM, before large PLOM which has competitive SVG inflow, RCA: occluded mid, LIMA --> LAD widely patent unobstructed anasomosis, SVG --> PLOM widely patent, SVG --> LVBr of distal RCA widely patent, moderate disease proximal to SVG insertion, yielding ischemic potential in small PDA (similar to cath 6 weeks ago, not fixable and VERY unlikely to be problematic).  IMP: CAD post PCI, CABG with very limited ischemic potential  Cardiac Cath 9/17 - Patent LIMA to LAD, SVG's to RCA and LCX.  Very large diagonal has eccentric 90% stenosis, treated with 2.75 x 12 Xience DES good result.  Bioprosthetic AV not crossed given recent echo.   Echo 8/17 - LVEF 55-60%, no WMA, wall thickness was increased, dilated LA, atrial septum thickened, mildly dilated RA, mild MR  Carotid Duplex 8/17 -  50-79% R ICA stenosis, 10-49% stenosis of the L ICA, >50% stenosis of the left ECA  ABI 12/16 - right 0.82 and left 0.88  Echo 6/16 - LVEF 55%, no WMA, bovine AVR with normal function, no AI, mild AS, no pericardial effusion, grd 1 dd, MAC with calcified chords noted with mild sclerosis, mild MR, trace TR  Carotid duplex 6/16 - right 50-69% prox ICA stenosis with moderate hemodynamic  compromise, left 1-49% prox ICA stenosis with mild hemodynamic compromise, no change compared to 2014  ABI 12/15 - right 0.70 and left 0.88  Peripheral angiogram 5/15 - stent in distal right SFA, stent in left common iliac artery  ABI 5/15 - right 0.68 and left 0.88  Abd CTA 5/15 - significant PVD, 80% proximal stenosis in celiac plexus, abdominal aorta with diffuse calcification and mural thrombus, mild disease in right and left renal arteries, moderate right iliac disease with CFA stenosis, SFA with moderate disease, left common iliac with ulcerated plaque proximally, moderate to severe disease in iliac system, SFA with moderate to severe diffuse disease  Echo 09/2012 - LVEF 45-50%, AVR ok, mild MR, mild TR  06/08/12 -CABG x 3 (LIMA to LAD, SVG to OM1, SVG to PLB) and 21 mm [facility] Ease pericardial prosthesis at [facility]  Cardiac Cath 06/04/2012 - mod AS (60m gradient), mod AI, diffuse 3 vessel CAD  Cardiac Cath 05/2011 - LVEF 30%, occluded mid RCA, high grade stenosis distal LCx, mild AS s/p PCI/DES to LCx by Dr. CCurrie Paris Cardiac Cath 02/2009 - patent stents in RCA and LCx  PCI to LCx 2001  MI/PCI to RCA 2000    Soc Hx: smokes 1/4 ppd, drinks 2 glasses of wine/month, no drug use  Fam Hx: father had CVA at age 67 had a couple of MIs in his 578sand had arteries replaced with teflon, mother passed at age 1761from aortic  aneurysm, brother passed age 5121from sepsis and had bad PVD with multiple bypass surgeries    She  has a past medical history of [place] ([place], Arthritis, CAD (coronary artery disease), Cervical spondylolysis, Chronic pain, COPD, Diabetes ([place], GERD (gastroesophageal reflux disease), Goiter, Hypertension, and Neuropathy.    Cardiovascular ROS: no chest pain, positive for dyspnea, edema, dizziness and fatigue   Respiratory ROS: no cough or wheezing  Neurological ROS: no TIA or stroke symptoms  All other systems negative except as above.     PE  Vitals:    11/18/17 0917   BP: 130/80   Pulse: 70   Resp: 16   Weight: 207 lb 3.2 oz (94 kg)   Height: 5' 4"  (1.626 m)    Body mass index is 35.57 kg/m??.   General appearance - alert, well appearing, and in no distress  Mental status - affect appropriate to mood  Eyes - sclera anicteric, moist mucous membranes  Neck - supple, bilateral carotid bruits   Lymphatics - not assessed  Chest - clear to auscultation, no wheezes, rales or rhonchi  Heart - normal rate, regular rhythm, normal S1, S2, 3/6 SEM    Abdomen - soft, nontender, nondistended, aorta feels enlarged   Back exam - full range of motion, no tenderness  Neurological - cranial nerves II through XII grossly intact, no focal deficit  Musculoskeletal - no muscular tenderness noted, normal strength  Extremities - peripheral pulses diminished, 1+ LE edema (left > right)    Skin - normal coloration  no rashes    12 lead ECG: NSR with RBBB, first degree AV block, non-specific ST-T wave changes     Recent Labs:  Lab Results   Component Value Date/Time    Cholesterol, total 90 10/16/2016 03:36 AM    Cholesterol, Total 122 09/23/2017 12:13 PM    HDL Cholesterol 46 10/16/2016 03:36 AM    LDL, calculated 18.2 10/16/2016 03:36 AM    Triglyceride 129 10/16/2016 03:36 AM    CHOL/HDL Ratio  2.0 10/16/2016 03:36 AM     Lab Results   Component Value Date/Time    Creatinine 0.94 09/23/2017 12:13 PM     Lab Results    Component Value Date/Time    BUN 16 09/23/2017 12:13 PM     Lab Results   Component Value Date/Time    Potassium 5.4 (H) 09/23/2017 12:13 PM     No results found for: HBA1C, HGBE8, HBA1CEXT, HBA1CEXT  Lab Results   Component Value Date/Time    HGB 9.2 (L) 10/21/2016 02:07 PM     Lab Results   Component Value Date/Time    PLATELET 185 10/21/2016 06:40 AM       Reviewed:  Past Medical History:   Diagnosis Date   ??? Acute MI ([place])     x3   ??? Arthritis    ??? CAD (coronary artery disease)    ??? Cervical spondylolysis    ??? Chronic pain    ??? COPD    ??? Diabetes ([place])    ??? GERD (gastroesophageal reflux disease)    ??? Goiter 04/20/2012   ??? Hypertension    ??? Neuropathy 04/06/2013     Social History     Tobacco Use   Smoking Status Former Smoker   ??? Packs/day: 0.30   ??? Years: 45.00   ??? Pack years: 13.50   ??? Types: Cigarettes   Smokeless Tobacco Never Used   Tobacco Comment    quit around the first of November     Social History     Substance and Sexual Activity   Alcohol Use Yes   ??? Frequency: Monthly or less   ??? Drinks per session: 1 or 2   ??? Binge frequency: Never    Comment: may be 3 times a year if that     Allergies   Allergen Reactions   ??? Morphine Anaphylaxis     Tolerates oxycodone without problem   ??? Avalide [Irbesartan-Hydrochlorothiazide] Myalgia   ??? Betadine [Povidone-Iodine] Rash   ??? Demerol [Meperidine] Other (comments)     Makes her feel crazy   ??? Pcn [Penicillins] Swelling   ??? Prinivil [Lisinopril] Cough   ??? Statins-Hmg-Coa Reductase Inhibitors Myalgia   ??? Sulfa (Sulfonamide Antibiotics) Unknown (comments)   ??? Tricor [Fenofibrate Micronized] Myalgia       Current Outpatient Medications   Medication Sig   ??? potassium chloride SR (KLOR-CON 10) 10 mEq tablet Take 10 mEq by mouth daily.   ??? clopidogrel (PLAVIX) 75 mg tab TAKE 1 TABLET BY MOUTH DAILY   ??? pravastatin (PRAVACHOL) 40 mg tablet TAKE 1 TAB BY MOUTH NIGHTLY.   ??? magnesium oxide (MAG-OX) 400 mg tablet Take 1 Tab by mouth daily.    ??? nabumetone (RELAFEN) 500 mg tablet TAKE 1 TAB BY MOUTH TWO (2) TIMES A DAY.   ??? DULoxetine (CYMBALTA) 60 mg capsule TAKE 1 CAPSULE BY MOUTH DAILY   ??? insulin lispro (HUMALOG U-100 INSULIN) 100 unit/mL injection by SubCUTAneous route. 0-8 units three times a day as needed   ??? carvedilol (COREG) 12.5 mg tablet Take 1 Tab by mouth two (2) times a day.   ??? VESICARE 10 mg tablet TAKE 1 TABLET BY MOUTH DAILY.   ??? losartan (COZAAR) 25 mg tablet TAKE 1 TAB BY MOUTH DAILY.   ??? VITAMIN D3 2,000 unit cap capsule TAKE ONE CAPSULE BY MOUTH DAILY   ??? aspirin delayed-release 81 mg tablet TAKE 1 TAB BY MOUTH DAILY.   ??? metFORMIN ER (GLUCOPHAGE XR) 500  mg tablet TAKE 2 TABLETS BY MOUTH IN THE MORNING WITH BREAKFAST AND 3 TABLETS BY MOUTH AT BEDTIME   ??? ezetimibe (ZETIA) 10 mg tablet TAKE 1 TAB BY MOUTH NIGHTLY.   ??? LEVEMIR FLEXTOUCH U-100 INSULN 100 unit/mL (3 mL) inpn INJECT 34 UNITS SUBCUTANEOUSLY AT BEDTIME DX: 250.00   ??? CHANTIX CONTINUING MONTH BOX 1 mg tablet TAKE 1 TABLET BY MOUTH TWICE A DAY   ??? pantoprazole (PROTONIX) 40 mg tablet TAKE 1 TABLET BY MOUTH DAILY   ??? ferrous sulfate (IRON) 325 mg (65 mg iron) EC tablet Take 1 Tab by mouth daily.   ??? gabapentin (NEURONTIN) 300 mg capsule Take 1,200 mg by mouth daily.   ??? acetaminophen (ACETAMINOPHEN EXTRA STRENGTH) 500 mg tablet Take 1,000 mg by mouth every six (6) hours. With tramadol   ??? nitroglycerin (NITROSTAT) 0.4 mg SL tablet 1 Tab by SubLINGual route every five (5) minutes as needed for Chest Pain for up to 3 doses.   ??? bumetanide (BUMEX) 2 mg tablet Take 2 mg by mouth daily.     No current facility-administered medications for this visit.        Telford Nab, MD  [facility], [address]  [place], [place]  [phone]

## 2017-11-23 MED ORDER — CHANTIX CONTINUING MONTH BOX 1 MG TABLET
1 mg | ORAL_TABLET | ORAL | 3 refills | Status: DC
Start: 2017-11-23 — End: 2018-02-26

## 2017-12-01 ENCOUNTER — Encounter: Attending: Internal Medicine | Primary: Internal Medicine

## 2017-12-06 ENCOUNTER — Encounter

## 2017-12-07 MED ORDER — LOSARTAN 25 MG TAB
25 mg | ORAL_TABLET | ORAL | 3 refills | Status: DC
Start: 2017-12-07 — End: 2018-04-23

## 2017-12-23 ENCOUNTER — Encounter

## 2017-12-23 MED ORDER — FERROUS SULFATE 325 MG (65 MG ELEMENTAL IRON) TAB
325 mg (65 mg iron) | ORAL_TABLET | ORAL | 0 refills | Status: DC
Start: 2017-12-23 — End: 2018-02-26

## 2017-12-23 NOTE — Telephone Encounter (Signed)
Requested Prescriptions     Signed Prescriptions Disp Refills   ??? ferrous sulfate 325 mg (65 mg iron) tablet 90 Tab 0     Sig: TAKE 1 TABLET BY MOUTH DAILY     Authorizing Provider: Marcellus ScottBROWNING, CHRISTINE M     Ordering User: Cori RazorAILEY, Neeva Trew M     Per verbal orders

## 2017-12-24 MED ORDER — NABUMETONE 500 MG TAB
500 mg | ORAL_TABLET | ORAL | 1 refills | Status: DC
Start: 2017-12-24 — End: 2018-01-20

## 2018-01-20 ENCOUNTER — Ambulatory Visit: Admit: 2018-01-20 | Attending: Internal Medicine | Primary: Internal Medicine

## 2018-01-20 ENCOUNTER — Encounter

## 2018-01-20 DIAGNOSIS — I2510 Atherosclerotic heart disease of native coronary artery without angina pectoris: Secondary | ICD-10-CM

## 2018-01-20 LAB — AMB POC URINALYSIS DIP STICK MANUAL W/O MICRO
Bilirubin (UA POC): NEGATIVE
Blood (UA POC): NEGATIVE
Glucose (UA POC): NEGATIVE
Ketones (UA POC): NEGATIVE
Leukocyte esterase (UA POC): NEGATIVE
Nitrites (UA POC): NEGATIVE
Protein (UA POC): NEGATIVE
Specific gravity (UA POC): 1.01 (ref 1.001–1.035)
Urobilinogen (UA POC): 0.2 (ref 0.2–1)
pH (UA POC): 5.5 (ref 4.6–8.0)

## 2018-01-20 LAB — AMB POC COMPLETE CBC,AUTOMATED ENTER
ABS. GRANS (POC): 8.7 10*3/uL — AB (ref 1.4–6.5)
ABS. LYMPHS (POC): 1.8 10*3/uL (ref 1.2–3.4)
ABS. MONOS (POC): 0.7 10*3/uL — AB (ref 0.1–0.6)
GRANULOCYTES (POC): 78 % — AB (ref 42.2–75.2)
HCT (POC): 38.7 % (ref 35.0–60.0)
HGB (POC): 13.1 g/dL (ref 11–18)
LYMPHOCYTES (POC): 16.1 % — AB (ref 20.5–51.1)
MCH (POC): 29.9 pg (ref 27.0–31.0)
MCHC (POC): 34 g/dL (ref 33.0–37.0)
MCV (POC): 87.8 fL (ref 80.0–99.9)
MONOCYTES (POC): 5.9 % (ref 1.7–9.3)
MPV (POC): 6.6 fL — AB (ref 7.8–11)
PLATELET (POC): 267 10*3/uL (ref 150–450)
RBC (POC): 4.4 M/uL (ref 4.00–6.00)
RDW (POC): 13.7 % (ref 11.6–13.7)
WBC (POC): 11.2 10*3/uL — AB (ref 4.5–10.5)

## 2018-01-20 LAB — AMB POC LIPID PROFILE
Cholesterol (POC): 130 mg/dL (ref 100–199)
HDL Cholesterol (POC): 53 mg/dL (ref 35–150)
LDL Cholesterol (POC): 31 mg/dL (ref 0–129)
Non-HDL Goal (POC): 77
TChol/HDL Ratio (POC): 2.5 CALC (ref 0.0–5.0)
Triglycerides (POC): 232 mg/dL — AB (ref 0–150)

## 2018-01-20 LAB — AMB POC HEMOGLOBIN A1C: Hemoglobin A1c (POC): 6.3 % — AB (ref 4.8–5.6)

## 2018-01-20 NOTE — Progress Notes (Signed)
Kelsey [name] is a 67 y.o. female and presents with   Chief Complaint   Patient presents with   ??? Other     F/U surgery   .does not have meds with her.  Not checking blood sugars.  Had carotid stent- end of incision hurts. Was able to express some fluid out a few weeks ago.  Has lost 30 lbs without trying over the last 2 yrs.  Weight the same as last year.  Just had ct of chest and abd in November,- Not eating as much.  Decreased appetite.  Mild nausea, extreme constipation.  Just returned from [place]. Had a hard time walking on the beach  Balance is not as good  Has follow up with vascular for PAD      Current Outpatient Medications   Medication Sig Dispense Refill   ??? ferrous sulfate 325 mg (65 mg iron) tablet TAKE 1 TABLET BY MOUTH DAILY 90 Tab 0   ??? losartan (COZAAR) 25 mg tablet TAKE 1 TAB BY MOUTH DAILY. 30 Tab 3   ??? CHANTIX CONTINUING MONTH BOX 1 mg tablet TAKE 1 TABLET BY MOUTH TWICE A DAY 56 Tab 3   ??? potassium chloride SR (KLOR-CON 10) 10 mEq tablet Take 10 mEq by mouth daily.     ??? clopidogrel (PLAVIX) 75 mg tab TAKE 1 TABLET BY MOUTH DAILY 30 Tab 5   ??? pravastatin (PRAVACHOL) 40 mg tablet TAKE 1 TAB BY MOUTH NIGHTLY. 90 Tab 3   ??? magnesium oxide (MAG-OX) 400 mg tablet Take 1 Tab by mouth daily. 90 Tab 3   ??? DULoxetine (CYMBALTA) 60 mg capsule TAKE 1 CAPSULE BY MOUTH DAILY 90 Cap 2   ??? insulin lispro (HUMALOG U-100 INSULIN) 100 unit/mL injection by SubCUTAneous route. 0-8 units three times a day as needed     ??? carvedilol (COREG) 12.5 mg tablet Take 1 Tab by mouth two (2) times a day. 180 Tab 3   ??? VESICARE 10 mg tablet TAKE 1 TABLET BY MOUTH DAILY. 90 Tab 4   ??? VITAMIN D3 2,000 unit cap capsule TAKE ONE CAPSULE BY MOUTH DAILY 90 Cap 3   ??? aspirin delayed-release 81 mg tablet TAKE 1 TAB BY MOUTH DAILY. 90 Tab 3   ??? metFORMIN ER (GLUCOPHAGE XR) 500 mg tablet TAKE 2 TABLETS BY MOUTH IN THE MORNING WITH BREAKFAST AND 3 TABLETS BY MOUTH AT BEDTIME (Patient taking differently: 2 tabs bid) 450 Tab 3    ??? ezetimibe (ZETIA) 10 mg tablet TAKE 1 TAB BY MOUTH NIGHTLY. 90 Tab 3   ??? LEVEMIR FLEXTOUCH U-100 INSULN 100 unit/mL (3 mL) inpn INJECT 34 UNITS SUBCUTANEOUSLY AT BEDTIME DX: 250.00 108 Adjustable Dose Pre-filled Pen Syringe 2   ??? CHANTIX CONTINUING MONTH BOX 1 mg tablet TAKE 1 TABLET BY MOUTH TWICE A DAY 56 Tab 0   ??? pantoprazole (PROTONIX) 40 mg tablet TAKE 1 TABLET BY MOUTH DAILY 90 Tab 3   ??? ferrous sulfate (IRON) 325 mg (65 mg iron) EC tablet Take 1 Tab by mouth daily. 90 Tab 3   ??? gabapentin (NEURONTIN) 300 mg capsule Take 1,200 mg by mouth daily.     ??? acetaminophen (ACETAMINOPHEN EXTRA STRENGTH) 500 mg tablet Take 1,000 mg by mouth every six (6) hours. With tramadol     ??? nitroglycerin (NITROSTAT) 0.4 mg SL tablet 1 Tab by SubLINGual route every five (5) minutes as needed for Chest Pain for up to 3 doses. 1 Bottle 1   ???  bumetanide (BUMEX) 2 mg tablet Take 2 mg by mouth daily.       Allergies   Allergen Reactions   ??? Morphine Anaphylaxis     Tolerates oxycodone without problem   ??? Avalide [Irbesartan-Hydrochlorothiazide] Myalgia   ??? Betadine [Povidone-Iodine] Rash   ??? Demerol [Meperidine] Other (comments)     Makes her feel crazy   ??? Pcn [Penicillins] Swelling   ??? Prinivil [Lisinopril] Cough   ??? Statins-Hmg-Coa Reductase Inhibitors Myalgia   ??? Sulfa (Sulfonamide Antibiotics) Unknown (comments)   ??? Tricor [Fenofibrate Micronized] Myalgia     Past Medical History:   Diagnosis Date   ??? Acute MI ([facility])     x3   ??? Arthritis    ??? CAD (coronary artery disease)    ??? Cervical spondylolysis    ??? Chronic pain    ??? COPD    ??? Diabetes ([facility])    ??? GERD (gastroesophageal reflux disease)    ??? Goiter 04/20/2012   ??? Hypertension    ??? Neuropathy 04/06/2013     Past Surgical History:   Procedure Laterality Date   ??? 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 CARDIAC SURG PROCEDURE UNLIST      PTCA   ??? HX KNEE ARTHROSCOPY Right     X2   ??? HX OTHER SURGICAL  1970    GANGLION CYST   ??? HX TONSILLECTOMY  1963     Family History    Problem Relation Age of Onset   ??? Lung Disease Mother 3684   ??? Stroke Father    ??? Lung Disease Brother 7076        COPD   ??? Hypertension Brother    ??? Cancer Brother         PROSTATE   ??? Heart Disease Brother    ??? Diabetes Brother    ??? No Known Problems Paternal Grandmother    ??? Anesth Problems Neg Hx      Social History     Tobacco Use   ??? Smoking status: Former Smoker     Packs/day: 0.30     Years: 45.00     Pack years: 13.50     Types: Cigarettes   ??? Smokeless tobacco: Never Used   ??? Tobacco comment: quit around the first of November   Substance Use Topics   ??? Alcohol use: Yes     Frequency: Monthly or less     Drinks per session: 1 or 2     Binge frequency: Never     Comment: may be 3 times a year if that      The patient does not have a history of falls. A plan of care for falls was documented..  Depression screen positive,- treated      Objective:  Visit Vitals  BP 136/80   Ht 5\' 4"  (1.626 m)   Wt 205 lb (93 kg)   BMI 35.19 kg/m??     wdwn 67 yo wf  In NAD. A&O.  HEENT -- Pupils round.  O/P Clear.  Neck -- Supple. No JVD. Right neck incision- well healed, no erythema, no discharge ? Possible suture lateral aspect.  Heart -- RRR. .  Lungs -- CTA.  Abdomen -- Soft. Non-tender. Non-distended. No masses. Bowel sounds present.  Extremities -- No edema. +1 pulses right, unable to palpate left..      Results for orders placed or performed in visit on 01/20/18   AMB POC URINALYSIS DIP  STICK MANUAL W/O MICRO   Result Value Ref Range    Color (UA POC) Yellow     Clarity (UA POC) Clear     Glucose (UA POC) Negative Negative    Bilirubin (UA POC) Negative Negative    Ketones (UA POC) Negative Negative    Specific gravity (UA POC) 1.010 1.001 - 1.035    Blood (UA POC) Negative Negative    pH (UA POC) 5.5 4.6 - 8.0    Protein (UA POC) Negative Negative    Urobilinogen (UA POC) 0.2 mg/dL 0.2 - 1    Nitrites (UA POC) Negative Negative    Leukocyte esterase (UA POC) Negative Negative   AMB POC HEMOGLOBIN A1C    Result Value Ref Range    Hemoglobin A1c (POC) 6.3 (A) 4.8 - 5.6 %   AMB POC LIPID PROFILE   Result Value Ref Range    Cholesterol (POC) 130 100 - 199 mg/dL    Triglycerides (POC) 232 (A) 0 - 150 mg/dL    HDL Cholesterol (POC) 53 35 - 150 mg/dL    LDL Cholesterol (POC) 31 0 - 129 mg/dL    Non-HDL Goal (POC) 77     TChol/HDL Ratio (POC) 2.5 0.0 - 5.0 CALC   AMB POC COMPLETE CBC,AUTOMATED ENTER   Result Value Ref Range    WBC (POC) 11.2 (A) 4.5 - 10.5 K/uL    LYMPHOCYTES (POC) 16.1 (A) 20.5 - 51.1 %    MONOCYTES (POC) 5.9 1.7 - 9.3 %    GRANULOCYTES (POC) 78.0 (A) 42.2 - 75.2 %    ABS. LYMPHS (POC) 1.8 1.2 - 3.4 K/uL    ABS. MONOS (POC) 0.7 (A) 0.1 - 0.6 10^3/ul    ABS. GRANS (POC) 8.7 (A) 1.4 - 6.5 10^3/ul    RBC (POC) 4.40 4.00 - 6.00 M/uL    HGB (POC) 13.1 11 - 18 g/dL    HCT (POC) 16.1 09.6 - 60.0 %    MCV (POC) 87.8 80.0 - 99.9 fL    MCH (POC) 29.9 27.0 - 31.0 pg    MCHC (POC) 34.0 33.0 - 37.0 g/dL    RDW (POC) 04.5 40.9 - 13.7 %    PLATELET (POC) 267 150 - 450 K/uL    MPV (POC) 6.6 (A) 7.8 - 11 fL       Assessment/Plan:    ICD-10-CM ICD-9-CM    1. Atherosclerosis of native coronary artery of native heart without angina pectoris I25.10 414.01    2. Hypertension complicating diabetes (HCC) E11.59 250.80 AMB POC URINALYSIS DIP STICK MANUAL W/O MICRO    I10 401.9    3. Coronary artery disease involving native heart with angina pectoris, unspecified vessel or lesion type (HCC) I25.119 414.01      413.9    4. Hypertensive urgency I16.0 401.9    5. Atherosclerosis of aorta (HCC) I70.0 440.0    6. 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 Type 2 diabetes mellitus with diabetic neuropathy, with long-term current use of insulin (HCC) E11.40 250.60 AMB POC HEMOGLOBIN A1C    Z79.4 357.2      V58.67    8. Neuropathy G62.9 355.9    9. Long term (current) use of insulin (HCC) Z79.4 V58.67    10. Hyperlipidemia, unspecified hyperlipidemia type E78.5 272.4 AMB POC  LIPID PROFILE      COLLECTION VENOUS BLOOD,VENIPUNCTURE      TSH 3RD  GENERATION      METABOLIC PANEL, COMPREHENSIVE      AMB POC COMPLETE CBC,AUTOMATED ENTER   discussed ct scans done in November  Follow up vascular  Increase exercise.    Author:  Lake Bells, MD 01/20/2018 11:50 AM

## 2018-01-20 NOTE — Patient Instructions (Signed)
Results for orders placed or performed in visit on 01/20/18   AMB POC URINALYSIS DIP STICK MANUAL W/O MICRO   Result Value Ref Range    Color (UA POC) Yellow     Clarity (UA POC) Clear     Glucose (UA POC) Negative Negative    Bilirubin (UA POC) Negative Negative    Ketones (UA POC) Negative Negative    Specific gravity (UA POC) 1.010 1.001 - 1.035    Blood (UA POC) Negative Negative    pH (UA POC) 5.5 4.6 - 8.0    Protein (UA POC) Negative Negative    Urobilinogen (UA POC) 0.2 mg/dL 0.2 - 1    Nitrites (UA POC) Negative Negative    Leukocyte esterase (UA POC) Negative Negative   AMB POC HEMOGLOBIN A1C   Result Value Ref Range    Hemoglobin A1c (POC) 6.3 (A) 4.8 - 5.6 %   AMB POC LIPID PROFILE   Result Value Ref Range    Cholesterol (POC) 130 100 - 199 mg/dL    Triglycerides (POC) 232 (A) 0 - 150 mg/dL    HDL Cholesterol (POC) 53 35 - 150 mg/dL    LDL Cholesterol (POC) 31 0 - 129 mg/dL    Non-HDL Goal (POC) 77     TChol/HDL Ratio (POC) 2.5 0.0 - 5.0 CALC   AMB POC COMPLETE CBC,AUTOMATED ENTER   Result Value Ref Range    WBC (POC) 11.2 (A) 4.5 - 10.5 K/uL    LYMPHOCYTES (POC) 16.1 (A) 20.5 - 51.1 %    MONOCYTES (POC) 5.9 1.7 - 9.3 %    GRANULOCYTES (POC) 78.0 (A) 42.2 - 75.2 %    ABS. LYMPHS (POC) 1.8 1.2 - 3.4 K/uL    ABS. MONOS (POC) 0.7 (A) 0.1 - 0.6 10^3/ul    ABS. GRANS (POC) 8.7 (A) 1.4 - 6.5 10^3/ul    RBC (POC) 4.40 4.00 - 6.00 M/uL    HGB (POC) 13.1 11 - 18 g/dL    HCT (POC) 16.138.7 09.635.0 - 60.0 %    MCV (POC) 87.8 80.0 - 99.9 fL    MCH (POC) 29.9 27.0 - 31.0 pg    MCHC (POC) 34.0 33.0 - 37.0 g/dL    RDW (POC) 04.513.7 40.911.6 - 13.7 %    PLATELET (POC) 267 150 - 450 K/uL    MPV (POC) 6.6 (A) 7.8 - 11 fL

## 2018-01-20 NOTE — Progress Notes (Signed)
pls call- thyroid is fine,  Blood sugars up some. Kidney and liver function fine, increase exercise.

## 2018-01-20 NOTE — Progress Notes (Signed)
Called patient

## 2018-01-21 LAB — METABOLIC PANEL, COMPREHENSIVE
A-G Ratio: 2 (ref 1.2–2.2)
ALT (SGPT): 10 IU/L (ref 0–32)
AST (SGOT): 14 IU/L (ref 0–40)
Albumin: 4.2 g/dL (ref 3.6–4.8)
Alk. phosphatase: 62 IU/L (ref 39–117)
BUN/Creatinine ratio: 16 (ref 12–28)
BUN: 15 mg/dL (ref 8–27)
Bilirubin, total: <0.2 mg/dL (ref 0.0–1.2)
CO2: 25 mmol/L (ref 20–29)
Calcium: 9.6 mg/dL (ref 8.7–10.3)
Chloride: 99 mmol/L (ref 96–106)
Creatinine: 0.91 mg/dL (ref 0.57–1.00)
GFR est AA: 76 mL/min/{1.73_m2} (ref >59)
GFR est non-AA: 65 mL/min/{1.73_m2} (ref >59)
GLOBULIN, TOTAL: 2.1 g/dL (ref 1.5–4.5)
Glucose: 156 mg/dL — ABNORMAL HIGH (ref 65–99)
Potassium: 4.7 mmol/L (ref 3.5–5.2)
Protein, total: 6.3 g/dL (ref 6.0–8.5)
Sodium: 138 mmol/L (ref 134–144)

## 2018-01-21 LAB — TSH 3RD GENERATION: TSH: 2.17 u[IU]/mL (ref 0.450–4.500)

## 2018-01-26 MED ORDER — PANTOPRAZOLE 40 MG TAB, DELAYED RELEASE
40 mg | ORAL_TABLET | ORAL | 0 refills | Status: DC
Start: 2018-01-26 — End: 2018-04-19

## 2018-02-02 ENCOUNTER — Ambulatory Visit: Admit: 2018-02-02 | Attending: Internal Medicine | Primary: Internal Medicine

## 2018-02-02 DIAGNOSIS — J0100 Acute maxillary sinusitis, unspecified: Secondary | ICD-10-CM

## 2018-02-02 LAB — AMB POC COMPLETE CBC,AUTOMATED ENTER
ABS. GRANS (POC): 5.5 10*3/uL (ref 1.4–6.5)
ABS. LYMPHS (POC): 1.6 10*3/uL (ref 1.2–3.4)
ABS. MONOS (POC): 0.5 10*3/uL (ref 0.1–0.6)
GRANULOCYTES (POC): 72.5 % (ref 42.2–75.2)
HCT (POC): 35.8 % (ref 35.0–60.0)
HGB (POC): 12.1 g/dL (ref 11–18)
LYMPHOCYTES (POC): 20.6 % (ref 20.5–51.1)
MCH (POC): 29.7 pg (ref 27.0–31.0)
MCHC (POC): 33.9 g/dL (ref 33.0–37.0)
MCV (POC): 87.6 fL (ref 80.0–99.9)
MONOCYTES (POC): 6.9 % (ref 1.7–9.3)
MPV (POC): 7 fL — AB (ref 7.8–11)
PLATELET (POC): 292 10*3/uL (ref 150–450)
RBC (POC): 4.08 M/uL (ref 4.00–6.00)
RDW (POC): 13.7 % (ref 11.6–13.7)
WBC (POC): 7.6 10*3/uL (ref 4.5–10.5)

## 2018-02-02 MED ORDER — BENZONATATE 200 MG CAP
200 mg | ORAL_CAPSULE | Freq: Three times a day (TID) | ORAL | 2 refills | Status: DC | PRN
Start: 2018-02-02 — End: 2018-02-05

## 2018-02-02 MED ORDER — AZITHROMYCIN 250 MG TAB
250 mg | ORAL_TABLET | ORAL | 0 refills | Status: DC
Start: 2018-02-02 — End: 2018-02-05

## 2018-02-02 MED ORDER — LIDOCAINE (PF) 10 MG/ML (1 %) IJ SOLN
10 mg/mL (1 %) | Freq: Once | INTRAMUSCULAR | 0 refills | Status: AC
Start: 2018-02-02 — End: 2018-02-02

## 2018-02-02 NOTE — ACP (Advance Care Planning) (Signed)
Advance Care Planning    Advance Care Planning (ACP) Provider Conversation Snapshot    Date of ACP Conversation: 02/02/18  Persons included in Conversation:  patient  Length of ACP Conversation in minutes:  16 minutes    Authorized Management consultantDecision Maker (if patient is incapable of making informed decisions):   This person is:   Education officer, museumHealthcare Agent/Medical Power of Attorney under Advance Directive          For Patients with Decision Making Capacity:   Values/Goals: Exploration of values, goals, and preferences if recovery is not expected, even with continued medical treatment in the event of:  Imminent death  Severe, permanent brain injury    Conversation Outcomes / Follow-Up Plan:   Reviewed existing Advance Directive DNR

## 2018-02-02 NOTE — Patient Instructions (Addendum)
Results for orders placed or performed in visit on 02/02/18   AMB POC COMPLETE CBC,AUTOMATED ENTER   Result Value Ref Range    WBC (POC) 7.6 4.5 - 10.5 K/uL    LYMPHOCYTES (POC) 20.6 20.5 - 51.1 %    MONOCYTES (POC) 6.9 1.7 - 9.3 %    GRANULOCYTES (POC) 72.5 42.2 - 75.2 %    ABS. LYMPHS (POC) 1.6 1.2 - 3.4 K/uL    ABS. MONOS (POC) 0.5 0.1 - 0.6 10^3/ul    ABS. GRANS (POC) 5.5 1.4 - 6.5 10^3/ul    RBC (POC) 4.08 4.00 - 6.00 M/uL    HGB (POC) 12.1 11 - 18 g/dL    HCT (POC) 16.1 09.6 - 60.0 %    MCV (POC) 87.6 80.0 - 99.9 fL    MCH (POC) 29.7 27.0 - 31.0 pg    MCHC (POC) 33.9 33.0 - 37.0 g/dL    RDW (POC) 04.5 40.9 - 13.7 %    PLATELET (POC) 292 150 - 450 K/uL    MPV (POC) 7.0 (A) 7.8 - 11 fL       Medicare Wellness Visit, Female     The best way to live healthy is to have a lifestyle where you eat a well-balanced diet, exercise regularly, limit alcohol use, and quit all forms of tobacco/nicotine, if applicable.   Regular preventive services are another way to keep healthy. Preventive services (vaccines, screening tests, monitoring & exams) can help personalize your care plan, which helps you manage your own care. Screening tests can find health problems at the earliest stages, when they are easiest to treat.   [facility] follows the current, evidence-based guidelines published by the [place]  [employer] (USPSTF) when recommending preventive services for our patients. Because we follow these guidelines, sometimes recommendations change over time as research supports it. (For example, mammograms used to be recommended annually. Even though Medicare will still pay for an annual mammogram, the newer guidelines recommend a mammogram every two years for women of average risk.)  Of course, you and your doctor may decide to screen more often for some diseases, based on your risk and your health status.   Preventive services for you include:   - Medicare offers their members a free annual wellness visit, which is time for you and your primary care provider to discuss and plan for your preventive service needs. Take advantage of this benefit every year!  -All adults over the age of 63 should receive the recommended pneumonia vaccines. Current USPSTF guidelines recommend a series of two vaccines for the best pneumonia protection.   -All adults should have a flu vaccine yearly and a tetanus vaccine every 10 years. All adults age 10 and older should receive a shingles vaccine once in their lifetime.    -A bone mass density test is recommended when a woman turns 65 to screen for osteoporosis. This test is only recommended one time, as a screening. Some providers will use this same test as a disease monitoring tool if you already have osteoporosis.  -All adults age 57-70 who are overweight should have a diabetes screening test once every three years.   -Other screening tests and preventive services for persons with diabetes include: an eye exam to screen for diabetic retinopathy, a kidney function test, a foot exam, and stricter control over your cholesterol.   -Cardiovascular screening for adults with routine risk involves an electrocardiogram (ECG) at intervals determined by your doctor.   -Colorectal  cancer screenings should be done for adults age 650-75 with no increased risk factors for colorectal cancer.  There are a number of acceptable methods of screening for this type of cancer. Each test has its own benefits and drawbacks. Discuss with your doctor what is most appropriate for you during your annual wellness visit. The different tests include: colonoscopy (considered the best screening method), a fecal occult blood test, a fecal DNA test, and sigmoidoscopy.  -Breast cancer screenings are recommended every other year for women of normal risk, age 68-74.  -Cervical cancer screenings for women over age 68 are only recommended with certain risk factors.    -All adults born between [id number] and 1966 should be screened once for Hepatitis C.     Here is a list of your current Health Maintenance items (your personalized list of preventive services) with a due date:  Health Maintenance Due   Topic Date Due   ??? Eye Exam  12[birth year]09/1961   ??? Shingles Vaccine (1 of 2) 12/09/2000   ??? Stool testing for trace blood  10/01/2013   ??? Pneumococcal Vaccine (2 of 2 - PPSV23) 03/04/2017   ??? Flu Vaccine  07/22/2017   ??? Glaucoma Screening   07/30/2017   ??? Annual Well Visit  10/01/2017

## 2018-02-02 NOTE — Telephone Encounter (Signed)
I will send in.

## 2018-02-02 NOTE — ACP (Advance Care Planning) (Signed)
Advance Care Planning    Advance Care Planning (ACP) Provider Conversation Snapshot    Date of ACP Conversation: 02/02/18  Persons included in Conversation:  patient  Length of ACP Conversation in minutes:  16 minutes    Authorized Decision Maker (if patient is incapable of making informed decisions):   This person is:   Healthcare Agent/Medical Power of Attorney under Advance Directive          For Patients with Decision Making Capacity:   Values/Goals: Exploration of values, goals, and preferences if recovery is not expected, even with continued medical treatment in the event of:  Imminent death  Severe, permanent brain injury    Conversation Outcomes / Follow-Up Plan:   Reviewed existing Advance Directive DNR

## 2018-02-02 NOTE — Progress Notes (Signed)
Kelsey [name] is a 67 y.o. female and presents with   Chief Complaint   Patient presents with   ??? Other     sinuses,hip pain   .  Patient c/o congestion and sinus drainage, face pain since last week.   Has been coughing.  Stops when she is able to get up "something hard"  .   No sore throat   No fever, no chest pain or sob  Also has pain in right hip and buttock.  Started off and on a few weeks ago, now constant.  Hurts to go up the stairs, hurts to lie on that side at night. gabapentin helps some.  Blood sugars good.      She is trying not to use as much gabapentin  Still struggling to stop smoking.      Current Outpatient Medications   Medication Sig Dispense Refill   ??? azithromycin (ZITHROMAX) 250 mg tablet Take 1 Tab by mouth See Admin Instructions for 5 days. 6 Tab 0   ??? lidocaine, PF, (XYLOCAINE) 10 mg/mL (1 %) injection 1 mL by Intra artICUlar route once for 1 dose. 1 mL 0   ??? benzonatate (TESSALON) 200 mg capsule Take 1 Cap by mouth three (3) times daily as needed for Cough for up to 7 days. 30 Cap 2   ??? pantoprazole (PROTONIX) 40 mg tablet TAKE 1 TABLET BY MOUTH DAILY 90 Tab 0   ??? ferrous sulfate 325 mg (65 mg iron) tablet TAKE 1 TABLET BY MOUTH DAILY 90 Tab 0   ??? losartan (COZAAR) 25 mg tablet TAKE 1 TAB BY MOUTH DAILY. 30 Tab 3   ??? CHANTIX CONTINUING MONTH BOX 1 mg tablet TAKE 1 TABLET BY MOUTH TWICE A DAY 56 Tab 3   ??? potassium chloride SR (KLOR-CON 10) 10 mEq tablet Take 10 mEq by mouth daily.     ??? clopidogrel (PLAVIX) 75 mg tab TAKE 1 TABLET BY MOUTH DAILY 30 Tab 5   ??? pravastatin (PRAVACHOL) 40 mg tablet TAKE 1 TAB BY MOUTH NIGHTLY. 90 Tab 3   ??? magnesium oxide (MAG-OX) 400 mg tablet Take 1 Tab by mouth daily. 90 Tab 3   ??? DULoxetine (CYMBALTA) 60 mg capsule TAKE 1 CAPSULE BY MOUTH DAILY 90 Cap 2   ??? insulin lispro (HUMALOG U-100 INSULIN) 100 unit/mL injection by SubCUTAneous route. 0-8 units three times a day as needed     ??? carvedilol (COREG) 12.5 mg tablet Take 1 Tab by mouth two (2) times a  day. 180 Tab 3   ??? VESICARE 10 mg tablet TAKE 1 TABLET BY MOUTH DAILY. 90 Tab 4   ??? VITAMIN D3 2,000 unit cap capsule TAKE ONE CAPSULE BY MOUTH DAILY 90 Cap 3   ??? aspirin delayed-release 81 mg tablet TAKE 1 TAB BY MOUTH DAILY. 90 Tab 3   ??? metFORMIN ER (GLUCOPHAGE XR) 500 mg tablet TAKE 2 TABLETS BY MOUTH IN THE MORNING WITH BREAKFAST AND 3 TABLETS BY MOUTH AT BEDTIME (Patient taking differently: 2 tabs bid) 450 Tab 3   ??? ezetimibe (ZETIA) 10 mg tablet TAKE 1 TAB BY MOUTH NIGHTLY. 90 Tab 3   ??? LEVEMIR FLEXTOUCH U-100 INSULN 100 unit/mL (3 mL) inpn INJECT 34 UNITS SUBCUTANEOUSLY AT BEDTIME DX: 250.00 108 Adjustable Dose Pre-filled Pen Syringe 2   ??? gabapentin (NEURONTIN) 300 mg capsule Take 1,200 mg by mouth daily.     ??? acetaminophen (ACETAMINOPHEN EXTRA STRENGTH) 500 mg tablet Take 1,000 mg by mouth every  six (6) hours. With tramadol     ??? nitroglycerin (NITROSTAT) 0.4 mg SL tablet 1 Tab by SubLINGual route every five (5) minutes as needed for Chest Pain for up to 3 doses. 1 Bottle 1   ??? bumetanide (BUMEX) 2 mg tablet Take 2 mg by mouth daily.       Allergies   Allergen Reactions   ??? Morphine Anaphylaxis     Tolerates oxycodone without problem   ??? Avalide [Irbesartan-Hydrochlorothiazide] Myalgia   ??? Betadine [Povidone-Iodine] Rash   ??? Demerol [Meperidine] Other (comments)     Makes her feel crazy   ??? Pcn [Penicillins] Swelling   ??? Prinivil [Lisinopril] Cough   ??? Statins-Hmg-Coa Reductase Inhibitors Myalgia   ??? Sulfa (Sulfonamide Antibiotics) Unknown (comments)   ??? Tricor [Fenofibrate Micronized] Myalgia     Past Medical History:   Diagnosis Date   ??? Acute MI (HCC)     x3   ??? Arthritis    ??? CAD (coronary artery disease)    ??? Cervical spondylolysis    ??? Chronic pain    ??? COPD    ??? Diabetes (HCC)    ??? GERD (gastroesophageal reflux disease)    ??? Goiter 04/20/2012   ??? Hypertension    ??? Neuropathy 04/06/2013     Past Surgical History:   Procedure Laterality Date   ??? 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 CARDIAC SURG PROCEDURE UNLIST      PTCA   ??? HX KNEE ARTHROSCOPY Right     X2   ??? HX OTHER SURGICAL  1970    GANGLION CYST   ??? HX TONSILLECTOMY  1963     Family History   Problem Relation Age of Onset   ??? Lung Disease Mother 2684   ??? Stroke Father    ??? Lung Disease Brother 3876        COPD   ??? Hypertension Brother    ??? Cancer Brother         PROSTATE   ??? Heart Disease Brother    ??? Diabetes Brother    ??? No Known Problems Paternal Grandmother    ??? Anesth Problems Neg Hx      Social History     Tobacco Use   ??? Smoking status: Former Smoker     Packs/day: 0.30     Years: 45.00     Pack years: 13.50     Types: Cigarettes   ??? Smokeless tobacco: Never Used   ??? Tobacco comment: quit around the first of November   Substance Use Topics   ??? Alcohol use: Yes     Frequency: Monthly or less     Drinks per session: 1 or 2     Binge frequency: Never     Comment: may be 3 times a year if that      The patient does not have a history of falls. A plan of care for falls was documented..  Depression screen pos- treated      Objective:  Visit Vitals  BP 140/70   Temp 97.6 ??F (36.4 ??C)   Ht 5\' 4"  (1.626 m)   Wt 205 lb (93 kg)   BMI 35.19 kg/m??     wdwn 67 yo wf  In NAD. A&O.  HEENT -- Pupils round.  O/P edematous uvula.  Tm- retracted and dull, +maxillary sinus tenderness.  Neck -- Supple. No JVD.no acn  Heart -- RRR. No R/M/G.  Lungs --  CTA.  Abdomen -- Soft. Non-tender. Non-distended. No masses. Bowel sounds present.  Extremities -- No edema. Left greater trochanter- tender on palpation.  Feet- no lesions      Results for orders placed or performed in visit on 02/02/18   AMB POC COMPLETE CBC,AUTOMATED ENTER   Result Value Ref Range    WBC (POC) 7.6 4.5 - 10.5 K/uL    LYMPHOCYTES (POC) 20.6 20.5 - 51.1 %    MONOCYTES (POC) 6.9 1.7 - 9.3 %    GRANULOCYTES (POC) 72.5 42.2 - 75.2 %    ABS. LYMPHS (POC) 1.6 1.2 - 3.4 K/uL    ABS. MONOS (POC) 0.5 0.1 - 0.6 10^3/ul    ABS. GRANS (POC) 5.5 1.4 - 6.5 10^3/ul    RBC (POC) 4.08 4.00 - 6.00 M/uL     HGB (POC) 12.1 11 - 18 g/dL    HCT (POC) 98.1 19.1 - 60.0 %    MCV (POC) 87.6 80.0 - 99.9 fL    MCH (POC) 29.7 27.0 - 31.0 pg    MCHC (POC) 33.9 33.0 - 37.0 g/dL    RDW (POC) 47.8 29.5 - 13.7 %    PLATELET (POC) 292 150 - 450 K/uL    MPV (POC) 7.0 (A) 7.8 - 11 fL       Assessment/Plan:    ICD-10-CM ICD-9-CM    1. Acute non-recurrent maxillary sinusitis J01.00 461.0 AMB POC COMPLETE CBC,AUTOMATED ENTER      azithromycin (ZITHROMAX) 250 mg tablet      benzonatate (TESSALON) 200 mg capsule   2. Atherosclerosis of native coronary artery of native heart without angina pectoris I25.10 414.01    3. Medicare annual wellness visit, subsequent Z00.00 V70.0 ADVANCE CARE PLANNING FIRST 30 MINS   4. Advanced directives, counseling/discussion Z71.89 V65.49 ADVANCE CARE PLANNING FIRST 30 MINS   5. Trochanteric bursitis of right hip M70.61 726.5 TRIAMCINOLONE ACETONIDE INJ      lidocaine, PF, (XYLOCAINE) 10 mg/mL (1 %) injection      PR DRAIN/INJECT LARGE JOINT/BURSA   6. Hypertension complicating diabetes (HCC) E11.59 250.80     I10 401.9    7. Coronary artery disease involving native heart with angina pectoris, unspecified vessel or lesion type (HCC) I25.119 414.01      413.9    8. Atherosclerosis of aorta (HCC) I70.0 440.0    9. Uncontrolled type 2 diabetes mellitus with hyperglycemia (HCC) E11.65 250.02    10. Type 2 diabetes mellitus with diabetic neuropathy, with long-term current use of insulin (HCC) E11.40 250.60     Z79.4 357.2      V58.67    11. 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     History     Past Medical History:   Diagnosis Date   ??? Acute MI (HCC)     x3   ??? Arthritis     ??? CAD (coronary artery disease)    ??? Cervical spondylolysis    ??? Chronic pain    ??? COPD    ??? Diabetes (HCC)    ??? GERD (gastroesophageal reflux disease)    ???  Goiter 04/20/2012   ??? Hypertension    ??? Neuropathy 04/06/2013      Past Surgical History:   Procedure Laterality Date   ??? 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 CARDIAC SURG PROCEDURE UNLIST      PTCA   ??? HX KNEE ARTHROSCOPY Right     X2   ??? HX OTHER SURGICAL  1970    GANGLION CYST   ??? HX TONSILLECTOMY  1963     Current Outpatient Medications   Medication Sig Dispense Refill   ??? azithromycin (ZITHROMAX) 250 mg tablet Take 1 Tab by mouth See Admin Instructions for 5 days. 6 Tab 0   ??? lidocaine, PF, (XYLOCAINE) 10 mg/mL (1 %) injection 1 mL by Intra artICUlar route once for 1 dose. 1 mL 0   ??? benzonatate (TESSALON) 200 mg capsule Take 1 Cap by mouth three (3) times daily as needed for Cough for up to 7 days. 30 Cap 2   ??? pantoprazole (PROTONIX) 40 mg tablet TAKE 1 TABLET BY MOUTH DAILY 90 Tab 0   ??? ferrous sulfate 325 mg (65 mg iron) tablet TAKE 1 TABLET BY MOUTH DAILY 90 Tab 0   ??? losartan (COZAAR) 25 mg tablet TAKE 1 TAB BY MOUTH DAILY. 30 Tab 3   ??? CHANTIX CONTINUING MONTH BOX 1 mg tablet TAKE 1 TABLET BY MOUTH TWICE A DAY 56 Tab 3   ??? potassium chloride SR (KLOR-CON 10) 10 mEq tablet Take 10 mEq by mouth daily.     ??? clopidogrel (PLAVIX) 75 mg tab TAKE 1 TABLET BY MOUTH DAILY 30 Tab 5   ??? pravastatin (PRAVACHOL) 40 mg tablet TAKE 1 TAB BY MOUTH NIGHTLY. 90 Tab 3   ??? magnesium oxide (MAG-OX) 400 mg tablet Take 1 Tab by mouth daily. 90 Tab 3   ??? DULoxetine (CYMBALTA) 60 mg capsule TAKE 1 CAPSULE BY MOUTH DAILY 90 Cap 2   ??? insulin lispro (HUMALOG U-100 INSULIN) 100 unit/mL injection by SubCUTAneous route. 0-8 units three times a day as needed     ??? carvedilol (COREG) 12.5 mg tablet Take 1 Tab by mouth two (2) times a day. 180 Tab 3   ??? VESICARE 10 mg tablet TAKE 1 TABLET BY MOUTH DAILY. 90 Tab 4    ??? VITAMIN D3 2,000 unit cap capsule TAKE ONE CAPSULE BY MOUTH DAILY 90 Cap 3   ??? aspirin delayed-release 81 mg tablet TAKE 1 TAB BY MOUTH DAILY. 90 Tab 3   ??? metFORMIN ER (GLUCOPHAGE XR) 500 mg tablet TAKE 2 TABLETS BY MOUTH IN THE MORNING WITH BREAKFAST AND 3 TABLETS BY MOUTH AT BEDTIME (Patient taking differently: 2 tabs bid) 450 Tab 3   ??? ezetimibe (ZETIA) 10 mg tablet TAKE 1 TAB BY MOUTH NIGHTLY. 90 Tab 3   ??? LEVEMIR FLEXTOUCH U-100 INSULN 100 unit/mL (3 mL) inpn INJECT 34 UNITS SUBCUTANEOUSLY AT BEDTIME DX: 250.00 108 Adjustable Dose Pre-filled Pen Syringe 2   ??? gabapentin (NEURONTIN) 300 mg capsule Take 1,200 mg by mouth daily.     ??? acetaminophen (ACETAMINOPHEN EXTRA STRENGTH) 500 mg tablet Take 1,000 mg by mouth every six (6) hours. With tramadol     ??? nitroglycerin (NITROSTAT) 0.4 mg SL tablet 1 Tab by SubLINGual route every five (5) minutes as needed for Chest Pain for up to 3 doses. 1 Bottle 1   ??? bumetanide (BUMEX) 2 mg tablet Take 2 mg by mouth daily.  Allergies   Allergen Reactions   ??? Morphine Anaphylaxis     Tolerates oxycodone without problem   ??? Avalide [Irbesartan-Hydrochlorothiazide] Myalgia   ??? Betadine [Povidone-Iodine] Rash   ??? Demerol [Meperidine] Other (comments)     Makes her feel crazy   ??? Pcn [Penicillins] Swelling   ??? Prinivil [Lisinopril] Cough   ??? Statins-Hmg-Coa Reductase Inhibitors Myalgia   ??? Sulfa (Sulfonamide Antibiotics) Unknown (comments)   ??? Tricor [Fenofibrate Micronized] Myalgia     Family History   Problem Relation Age of Onset   ??? Lung Disease Mother 52   ??? Stroke Father    ??? Lung Disease Brother 49        COPD   ??? Hypertension Brother    ??? Cancer Brother         PROSTATE   ??? Heart Disease Brother    ??? Diabetes Brother    ??? No Known Problems Paternal Grandmother    ??? Anesth Problems Neg Hx      Social History     Tobacco Use   ??? Smoking status: Former Smoker     Packs/day: 0.30     Years: 45.00     Pack years: 13.50     Types: Cigarettes    ??? Smokeless tobacco: Never Used   ??? Tobacco comment: quit around the first of November   Substance Use Topics   ??? Alcohol use: Yes     Frequency: Monthly or less     Drinks per session: 1 or 2     Binge frequency: Never     Comment: may be 3 times a year if that     Patient Active Problem List   Diagnosis Code   ??? Coronary atherosclerosis of native coronary artery I25.10   ??? Other chest pain R07.89   ??? Other dyspnea and respiratory abnormality R06.09, R09.89   ??? Mixed hyperlipidemia E78.2   ??? Type II diabetes mellitus, uncontrolled (HCC) E11.65   ??? Dyspnea R06.00   ??? Goiter E04.9   ??? Neuropathy G62.9   ??? Long term (current) use of insulin (HCC) Z79.4   ??? Hypertension complicating diabetes (HCC) E11.59, I10   ??? CAD (coronary artery disease) I25.10   ??? 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 Hypertensive urgency I16.0   ??? Anemia D64.9   ??? 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 Type 2 diabetes mellitus with diabetic neuropathy (HCC) E11.40   ??? Atherosclerosis of aorta (HCC) I70.0   ??? Severe obesity (BMI 35.0-39.9) E66.01       Depression Risk Factor Screening:     3 most recent PHQ Screens 02/02/2018   PHQ Not Done Active Diagnosis of Depression or Bipolar Disorder   Little interest or pleasure in doing things -   Feeling down, depressed, irritable, or hopeless -   Total Score PHQ 2 -     Alcohol Risk Factor Screening:   You do not drink alcohol or very rarely.    Functional Ability and Level of Safety:   Hearing Loss  The patient needs further evaluation.    Activities of Daily Living  The home contains: handrails  Patient does total self care    Fall Risk  Fall Risk Assessment, last 12 mths 02/02/2018   Able to walk? Yes   Fall in past 12 months? No   Fall with injury? -   Number of falls in past 12 months -   Fall Risk Score -  Abuse Screen  Patient is not abused    Cognitive Screening   Evaluation of Cognitive Function:  Has your family/caregiver stated any concerns about your memory: no  Normal     Patient Care Team   Patient Care Team:  Lake Bells, MD as PCP - General (Internal Medicine)  Marcellus Scott, MD (Cardiology)  Diefenderfer, Mitzi Davenport, NP (Nurse Practitioner)    Assessment/Plan   Education and counseling provided:  Are appropriate based on today's review and evaluation  End-of-Life planning (with patient's consent)    Diagnoses and all orders for this visit:    1. Acute non-recurrent maxillary sinusitis  -     AMB POC COMPLETE CBC,AUTOMATED ENTER  -     azithromycin (ZITHROMAX) 250 mg tablet; Take 1 Tab by mouth See Admin Instructions for 5 days.  -     benzonatate (TESSALON) 200 mg capsule; Take 1 Cap by mouth three (3) times daily as needed for Cough for up to 7 days.    2. Atherosclerosis of native coronary artery of native heart without angina pectoris    3. Medicare annual wellness visit, subsequent  -     ADVANCE CARE PLANNING FIRST 30 MINS    4. Advanced directives, counseling/discussion  -     ADVANCE CARE PLANNING FIRST 30 MINS    5. Trochanteric bursitis of right hip  -     TRIAMCINOLONE ACETONIDE INJ  -     lidocaine, PF, (XYLOCAINE) 10 mg/mL (1 %) injection; 1 mL by Intra artICUlar route once for 1 dose.  -     PR DRAIN/INJECT LARGE JOINT/BURSA    6. Hypertension complicating diabetes (HCC)    7. Coronary artery disease involving native heart with angina pectoris, unspecified vessel or lesion type (HCC)    8. Atherosclerosis of aorta (HCC)    9. Uncontrolled type 2 diabetes mellitus with hyperglycemia (HCC)    10. Type 2 diabetes mellitus with diabetic neuropathy, with long-term current use of insulin (HCC)    11. 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 EYE EXAM RETINAL OR DILATED  12[birth year]09/1960   ??? Shingrix Vaccine Age 27> (1 of 2) 12/09/2000   ??? FOBT Q 1 YEAR AGE 58-75  10/01/2013   ??? Pneumococcal 65+ Low/Medium Risk (2 of 2 - PPSV23) 03/04/2017   ??? Influenza Age 61 to Adult  07/22/2017    ??? GLAUCOMA SCREENING Q2Y  07/30/2017     Indications:   Unexplained monoarthritis    Procedure:  After consent was obtained, using sterile technique the left greater trochanteric bursa was prepped using Betadine. Local anesthetic used: 1% lidocaine.. The joint was entered.    Kenalog 40 mg was mixed with 1% lidocaine 1 ml  and injected into the joint and the needle withdrawn.  The procedure was well tolerated.  The patient is asked to continue to rest the joint for a few more days before resuming regular activities.  It may be more painful for the first 1-2 days.  Watch for fever, or increased swelling or persistent pain in the joint. Call or return to clinic prn if such symptoms occur or there is failure to improve as anticipated.

## 2018-02-04 ENCOUNTER — Encounter

## 2018-02-05 ENCOUNTER — Ambulatory Visit: Payer: MEDICARE | Primary: Internal Medicine

## 2018-02-05 ENCOUNTER — Ambulatory Visit: Admit: 2018-02-05 | Attending: Internal Medicine | Primary: Internal Medicine

## 2018-02-05 DIAGNOSIS — G8929 Other chronic pain: Secondary | ICD-10-CM

## 2018-02-05 MED ORDER — LIDOCAINE (PF) 10 MG/ML (1 %) IJ SOLN
10 mg/mL (1 %) | Freq: Once | INTRAMUSCULAR | 0 refills | Status: AC
Start: 2018-02-05 — End: 2018-02-05

## 2018-02-05 NOTE — Progress Notes (Signed)
Kelsey [name] is a 67 y.o. female and presents with   Chief Complaint   Patient presents with   ??? Other     cortisone shot   .  Kelsey [name] says the hip injection relieved her leg pain but not the hip pain.   We will try to inject her back  She has seen her chiropractor and we have set her up with PT.  Left leg is an inch shorter than right so makes her gait uneven.      Current Outpatient Medications   Medication Sig Dispense Refill   ??? lidocaine, PF, (XYLOCAINE) 10 mg/mL (1 %) injection 1 mL by Intra artICUlar route once for 1 dose. 1 mL 0   ??? pantoprazole (PROTONIX) 40 mg tablet TAKE 1 TABLET BY MOUTH DAILY 90 Tab 0   ??? ferrous sulfate 325 mg (65 mg iron) tablet TAKE 1 TABLET BY MOUTH DAILY 90 Tab 0   ??? losartan (COZAAR) 25 mg tablet TAKE 1 TAB BY MOUTH DAILY. 30 Tab 3   ??? CHANTIX CONTINUING MONTH BOX 1 mg tablet TAKE 1 TABLET BY MOUTH TWICE A DAY 56 Tab 3   ??? potassium chloride SR (KLOR-CON 10) 10 mEq tablet Take 10 mEq by mouth daily.     ??? clopidogrel (PLAVIX) 75 mg tab TAKE 1 TABLET BY MOUTH DAILY 30 Tab 5   ??? pravastatin (PRAVACHOL) 40 mg tablet TAKE 1 TAB BY MOUTH NIGHTLY. 90 Tab 3   ??? magnesium oxide (MAG-OX) 400 mg tablet Take 1 Tab by mouth daily. 90 Tab 3   ??? DULoxetine (CYMBALTA) 60 mg capsule TAKE 1 CAPSULE BY MOUTH DAILY 90 Cap 2   ??? insulin lispro (HUMALOG U-100 INSULIN) 100 unit/mL injection by SubCUTAneous route. 0-8 units three times a day as needed     ??? carvedilol (COREG) 12.5 mg tablet Take 1 Tab by mouth two (2) times a day. 180 Tab 3   ??? VESICARE 10 mg tablet TAKE 1 TABLET BY MOUTH DAILY. 90 Tab 4   ??? VITAMIN D3 2,000 unit cap capsule TAKE ONE CAPSULE BY MOUTH DAILY 90 Cap 3   ??? aspirin delayed-release 81 mg tablet TAKE 1 TAB BY MOUTH DAILY. 90 Tab 3   ??? metFORMIN ER (GLUCOPHAGE XR) 500 mg tablet TAKE 2 TABLETS BY MOUTH IN THE MORNING WITH BREAKFAST AND 3 TABLETS BY MOUTH AT BEDTIME (Patient taking differently: 2 tabs bid) 450 Tab 3    ??? ezetimibe (ZETIA) 10 mg tablet TAKE 1 TAB BY MOUTH NIGHTLY. 90 Tab 3   ??? LEVEMIR FLEXTOUCH U-100 INSULN 100 unit/mL (3 mL) inpn INJECT 34 UNITS SUBCUTANEOUSLY AT BEDTIME DX: 250.00 108 Adjustable Dose Pre-filled Pen Syringe 2   ??? gabapentin (NEURONTIN) 300 mg capsule Take 1,200 mg by mouth daily.     ??? acetaminophen (ACETAMINOPHEN EXTRA STRENGTH) 500 mg tablet Take 1,000 mg by mouth every six (6) hours. With tramadol     ??? nitroglycerin (NITROSTAT) 0.4 mg SL tablet 1 Tab by SubLINGual route every five (5) minutes as needed for Chest Pain for up to 3 doses. 1 Bottle 1   ??? bumetanide (BUMEX) 2 mg tablet Take 2 mg by mouth daily.       Allergies   Allergen Reactions   ??? Morphine Anaphylaxis     Tolerates oxycodone without problem   ??? Avalide [Irbesartan-Hydrochlorothiazide] Myalgia   ??? Betadine [Povidone-Iodine] Rash   ??? Demerol [Meperidine] Other (comments)     Makes her feel crazy   ???  Pcn [Penicillins] Swelling   ??? Prinivil [Lisinopril] Cough   ??? Statins-Hmg-Coa Reductase Inhibitors Myalgia   ??? Sulfa (Sulfonamide Antibiotics) Unknown (comments)   ??? Tricor [Fenofibrate Micronized] Myalgia     Past Medical History:   Diagnosis Date   ??? Acute MI (HCC)     x3   ??? Arthritis    ??? CAD (coronary artery disease)    ??? Cervical spondylolysis    ??? Chronic pain    ??? COPD    ??? Diabetes (HCC)    ??? GERD (gastroesophageal reflux disease)    ??? Goiter 04/20/2012   ??? Hypertension    ??? Neuropathy 04/06/2013     Past Surgical History:   Procedure Laterality Date   ??? 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 CARDIAC SURG PROCEDURE UNLIST      PTCA   ??? HX KNEE ARTHROSCOPY Right     X2   ??? HX OTHER SURGICAL  1970    GANGLION CYST   ??? HX TONSILLECTOMY  1963     Family History   Problem Relation Age of Onset   ??? Lung Disease Mother 7084   ??? Stroke Father    ??? Lung Disease Brother 5376        COPD   ??? Hypertension Brother    ??? Cancer Brother         PROSTATE   ??? Heart Disease Brother    ??? Diabetes Brother     ??? No Known Problems Paternal Grandmother    ??? Anesth Problems Neg Hx      Social History     Tobacco Use   ??? Smoking status: Former Smoker     Packs/day: 0.30     Years: 45.00     Pack years: 13.50     Types: Cigarettes   ??? Smokeless tobacco: Never Used   ??? Tobacco comment: quit around the first of November   Substance Use Topics   ??? Alcohol use: Yes     Frequency: Monthly or less     Drinks per session: 1 or 2     Binge frequency: Never     Comment: may be 3 times a year if that      The patient does not have a history of falls. A plan of care for falls was documented..  Depression screen pos- treated      Objective:  Visit Vitals  BP 150/80   Ht 5\' 4"  (1.626 m)   Wt 205 lb (93 kg)   BMI 35.19 kg/m??     wdwn 67 yo wf  In NAD. A&O.  HEENT -- Pupils round.  O/P Clear.  Neck -- Supple. No JVD.  Heart -- RRR. No R/M/G.  Lungs -- CTA.  Abdomen -- Soft. Non-tender. Non-distended. No masses. Bowel sounds present.  Extremities -- No edema.  Right l spine- tender on palpation over l2/3 and into right buttock        Assessment/Plan:    ICD-10-CM ICD-9-CM    1. Chronic right-sided low back pain with right-sided sciatica M54.41 724.2 TRIAMCINOLONE ACETONIDE INJ    G89.29 724.3 lidocaine, PF, (XYLOCAINE) 10 mg/mL (1 %) injection     338.29 PR DRAIN/INJECT INTERMEDIATE JOINT/BURSA   2. Atherosclerosis of native coronary artery of native heart without angina pectoris I25.10 414.01    3. Hypertension complicating diabetes (HCC) E11.59 250.80     I10 401.9    4. Atherosclerosis of aorta (HCC) I70.0  440.0    5. Uncontrolled type 2 diabetes mellitus with hyperglycemia (HCC) E11.65 250.02    6. Type 2 diabetes mellitus with diabetic neuropathy, with long-term current use of insulin (HCC) E11.40 250.60     Z79.4 357.2      V58.67    7. Severe obesity with body mass index (BMI) of 35.0 to 39.9 with serious comorbidity (HCC) E66.01 278.01    8. Long term (current) use of insulin [facility]) Z79.4 V58.67             Author:  Lake Bells, MD 02/05/2018 5:59 PMIndications:   Symptom relief from osteoarthritis    Procedure:  After consent was obtained, using sterile technique the right l3/4 joint was prepped using Betadine. Local anesthetic used: 1% lidocaine.. The joint was entered.     Kenalog 40 mg was mixed with 1% lidocaine 1 ml  and injected into the joint and the needle withdrawn.  The procedure was well tolerated.  The patient is asked to continue to rest the joint for a few more days before resuming regular activities.  It may be more painful for the first 1-2 days.  Watch for fever, or increased swelling or persistent pain in the joint. Call or return to clinic prn if such symptoms occur or there is failure to improve as anticipated.

## 2018-02-08 ENCOUNTER — Encounter

## 2018-02-08 MED ORDER — NABUMETONE 500 MG TAB
500 mg | ORAL_TABLET | ORAL | 0 refills | Status: DC
Start: 2018-02-08 — End: 2018-02-22

## 2018-02-09 ENCOUNTER — Ambulatory Visit: Payer: MEDICARE | Primary: Internal Medicine

## 2018-02-13 ENCOUNTER — Encounter

## 2018-02-13 MED ORDER — METHYLPREDNISOLONE 4 MG TABS IN A DOSE PACK
4 mg | ORAL | 0 refills | Status: DC
Start: 2018-02-13 — End: 2018-02-22

## 2018-02-15 ENCOUNTER — Inpatient Hospital Stay: Admit: 2018-02-15 | Payer: MEDICARE | Attending: Internal Medicine | Primary: Internal Medicine

## 2018-02-15 ENCOUNTER — Ambulatory Visit: Admit: 2018-02-15 | Attending: Internal Medicine | Primary: Internal Medicine

## 2018-02-15 ENCOUNTER — Encounter

## 2018-02-15 DIAGNOSIS — M25551 Pain in right hip: Secondary | ICD-10-CM

## 2018-02-15 DIAGNOSIS — M1611 Unilateral primary osteoarthritis, right hip: Secondary | ICD-10-CM

## 2018-02-15 MED ORDER — HYDROCODONE-ACETAMINOPHEN 7.5 MG-325 MG TAB
ORAL_TABLET | Freq: Three times a day (TID) | ORAL | 0 refills | Status: DC | PRN
Start: 2018-02-15 — End: 2018-02-22

## 2018-02-15 NOTE — Progress Notes (Signed)
Kelsey [name] is a 67 y.o. female and presents with   Chief Complaint   Patient presents with   ??? Pain (Chronic)   .  Patient started having acute pain right hip and buttock  Pain after   Doing PT.  Injections in trochanteric bursa and l spine did nothing to help.  Steroid dosepak over the weekend did not help. Hurts to walk, to sit.  Tramadol did nothing.  She used some only percocet which helped some.  She has stopped using the relafen.? Thought I told her to.        Current Outpatient Medications   Medication Sig Dispense Refill   ??? HYDROcodone-acetaminophen (NORCO) 7.5-325 mg per tablet Take 1 Tab by mouth every eight (8) hours as needed for Pain. Max Daily Amount: 3 Tabs. 15 Tab 0   ??? methylPREDNISolone (MEDROL, PAK,) 4 mg tablet As directed 1 Dose Pack 0   ??? nabumetone (RELAFEN) 500 mg tablet TAKE 1 TAB BY MOUTH TWO (2) TIMES A DAY. 60 Tab 0   ??? pantoprazole (PROTONIX) 40 mg tablet TAKE 1 TABLET BY MOUTH DAILY 90 Tab 0   ??? ferrous sulfate 325 mg (65 mg iron) tablet TAKE 1 TABLET BY MOUTH DAILY 90 Tab 0   ??? losartan (COZAAR) 25 mg tablet TAKE 1 TAB BY MOUTH DAILY. 30 Tab 3   ??? CHANTIX CONTINUING MONTH BOX 1 mg tablet TAKE 1 TABLET BY MOUTH TWICE A DAY 56 Tab 3   ??? potassium chloride SR (KLOR-CON 10) 10 mEq tablet Take 10 mEq by mouth daily.     ??? clopidogrel (PLAVIX) 75 mg tab TAKE 1 TABLET BY MOUTH DAILY 30 Tab 5   ??? pravastatin (PRAVACHOL) 40 mg tablet TAKE 1 TAB BY MOUTH NIGHTLY. 90 Tab 3   ??? magnesium oxide (MAG-OX) 400 mg tablet Take 1 Tab by mouth daily. 90 Tab 3   ??? DULoxetine (CYMBALTA) 60 mg capsule TAKE 1 CAPSULE BY MOUTH DAILY 90 Cap 2   ??? insulin lispro (HUMALOG U-100 INSULIN) 100 unit/mL injection by SubCUTAneous route. 0-8 units three times a day as needed     ??? carvedilol (COREG) 12.5 mg tablet Take 1 Tab by mouth two (2) times a day. 180 Tab 3   ??? VESICARE 10 mg tablet TAKE 1 TABLET BY MOUTH DAILY. 90 Tab 4   ??? VITAMIN D3 2,000 unit cap capsule TAKE ONE CAPSULE BY MOUTH DAILY 90 Cap 3    ??? aspirin delayed-release 81 mg tablet TAKE 1 TAB BY MOUTH DAILY. 90 Tab 3   ??? metFORMIN ER (GLUCOPHAGE XR) 500 mg tablet TAKE 2 TABLETS BY MOUTH IN THE MORNING WITH BREAKFAST AND 3 TABLETS BY MOUTH AT BEDTIME (Patient taking differently: 2 tabs bid) 450 Tab 3   ??? ezetimibe (ZETIA) 10 mg tablet TAKE 1 TAB BY MOUTH NIGHTLY. 90 Tab 3   ??? LEVEMIR FLEXTOUCH U-100 INSULN 100 unit/mL (3 mL) inpn INJECT 34 UNITS SUBCUTANEOUSLY AT BEDTIME DX: 250.00 108 Adjustable Dose Pre-filled Pen Syringe 2   ??? gabapentin (NEURONTIN) 300 mg capsule Take 1,200 mg by mouth daily.     ??? acetaminophen (ACETAMINOPHEN EXTRA STRENGTH) 500 mg tablet Take 1,000 mg by mouth every six (6) hours. With tramadol     ??? nitroglycerin (NITROSTAT) 0.4 mg SL tablet 1 Tab by SubLINGual route every five (5) minutes as needed for Chest Pain for up to 3 doses. 1 Bottle 1   ??? bumetanide (BUMEX) 2 mg tablet Take 2 mg by  mouth daily.       Allergies   Allergen Reactions   ??? Morphine Anaphylaxis     Tolerates oxycodone without problem   ??? Avalide [Irbesartan-Hydrochlorothiazide] Myalgia   ??? Betadine [Povidone-Iodine] Rash   ??? Demerol [Meperidine] Other (comments)     Makes her feel crazy   ??? Pcn [Penicillins] Swelling   ??? Prinivil [Lisinopril] Cough   ??? Statins-Hmg-Coa Reductase Inhibitors Myalgia   ??? Sulfa (Sulfonamide Antibiotics) Unknown (comments)   ??? Tricor [Fenofibrate Micronized] Myalgia     Past Medical History:   Diagnosis Date   ??? Acute MI (HCC)     x3   ??? Arthritis    ??? CAD (coronary artery disease)    ??? Cervical spondylolysis    ??? Chronic pain    ??? COPD    ??? Diabetes (HCC)    ??? GERD (gastroesophageal reflux disease)    ??? Goiter 04/20/2012   ??? Hypertension    ??? Neuropathy 04/06/2013     Past Surgical History:   Procedure Laterality Date   ??? 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 CARDIAC SURG PROCEDURE UNLIST      PTCA   ??? HX KNEE ARTHROSCOPY Right     X2   ??? HX OTHER SURGICAL  1970    GANGLION CYST   ??? HX TONSILLECTOMY  1963     Family History    Problem Relation Age of Onset   ??? Lung Disease Mother 78   ??? Stroke Father    ??? Lung Disease Brother 6        COPD   ??? Hypertension Brother    ??? Cancer Brother         PROSTATE   ??? Heart Disease Brother    ??? Diabetes Brother    ??? No Known Problems Paternal Grandmother    ??? Anesth Problems Neg Hx      Social History     Tobacco Use   ??? Smoking status: Former Smoker     Packs/day: 0.30     Years: 45.00     Pack years: 13.50     Types: Cigarettes   ??? Smokeless tobacco: Never Used   ??? Tobacco comment: quit around the first of November   Substance Use Topics   ??? Alcohol use: Yes     Frequency: Monthly or less     Drinks per session: 1 or 2     Binge frequency: Never     Comment: may be 3 times a year if that      The patient does not have a history of falls. A plan of care for falls was documented..  Depression screen positive- treated      Objective:  Visit Vitals  BP 150/70   Ht 5\' 4"  (1.626 m)   Wt 205 lb (93 kg)   BMI 35.19 kg/m??     Heavy 67 yo wf  In NAD. A&O.  HEENT -- Pupils round.  O/P Clear.  Neck -- Supple. No JVD.  Heart -- RRR. No R/M/G.  Lungs -- CTA.  Abdomen -- Soft. Non-tender. Non-distended. No masses. Bowel sounds present.  Extremities -- No edema.  Right hip- tender over si joint, increased pain with internal rotation right hip        Assessment/Plan:    ICD-10-CM ICD-9-CM    1. Pain of right hip joint M25.551 719.45 REFERRAL TO ORTHOPEDICS      HYDROcodone-acetaminophen (NORCO) 7.5-325 mg  per tablet      CANCELED: XR HIP LT W OR WO PELV 2-3 VWS   2. Uncontrolled type 2 diabetes mellitus with hyperglycemia (HCC) E11.65 250.02    3. Hypertension complicating diabetes (HCC) E11.59 250.80     I10 401.9    4. Atherosclerosis of aorta (HCC) I70.0 440.0    5. 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 Type 2 diabetes mellitus with diabetic neuropathy, with long-term current use of insulin (HCC) E11.40 250.60     Z79.4 357.2      V58.67     7. Long term (current) use of insulin (HCC) Z79.4 V58.67    8. Aortic mural thrombus (HCC) I74.10 444.1      rx #20 norco 7.5  Restart relafen,  Follow up Dr. Rennie PlowmanHull      Author:  Lake BellsNancy J Yvana Samonte, MD 02/15/2018 11:54 AM

## 2018-02-18 ENCOUNTER — Encounter: Attending: Internal Medicine | Primary: Internal Medicine

## 2018-02-22 ENCOUNTER — Emergency Department: Payer: MEDICARE | Primary: Internal Medicine

## 2018-02-22 ENCOUNTER — Emergency Department: Admit: 2018-02-22 | Payer: MEDICARE | Primary: Internal Medicine

## 2018-02-22 ENCOUNTER — Inpatient Hospital Stay
Admit: 2018-02-22 | Discharge: 2018-02-22 | Disposition: A | Discharge status: Home Health | Payer: MEDICARE | Attending: Emergency Medicine

## 2018-02-22 DIAGNOSIS — M1611 Unilateral primary osteoarthritis, right hip: Secondary | ICD-10-CM

## 2018-02-22 LAB — CBC WITH AUTOMATED DIFF
ABS. BASOPHILS: 0 10*3/uL (ref 0.0–0.1)
ABS. EOSINOPHILS: 0.1 10*3/uL (ref 0.0–0.4)
ABS. IMM. GRANS.: 0 10*3/uL (ref 0.00–0.04)
ABS. LYMPHOCYTES: 2 10*3/uL (ref 0.8–3.5)
ABS. MONOCYTES: 0.7 10*3/uL (ref 0.0–1.0)
ABS. NEUTROPHILS: 5.8 10*3/uL (ref 1.8–8.0)
ABSOLUTE NRBC: 0 10*3/uL (ref 0.00–0.01)
BASOPHILS: 1 % (ref 0–1)
EOSINOPHILS: 1 % (ref 0–7)
HCT: 39 % (ref 35.0–47.0)
HGB: 12.9 g/dL (ref 11.5–16.0)
IMMATURE GRANULOCYTES: 0 % (ref 0.0–0.5)
LYMPHOCYTES: 23 % (ref 12–49)
MCH: 29.5 PG (ref 26.0–34.0)
MCHC: 33.1 g/dL (ref 30.0–36.5)
MCV: 89.2 FL (ref 80.0–99.0)
MONOCYTES: 8 % (ref 5–13)
MPV: 10 FL (ref 8.9–12.9)
NEUTROPHILS: 67 % (ref 32–75)
NRBC: 0 PER 100 WBC
PLATELET: 249 10*3/uL (ref 150–400)
RBC: 4.37 M/uL (ref 3.80–5.20)
RDW: 13.2 % (ref 11.5–14.5)
WBC: 8.6 10*3/uL (ref 3.6–11.0)

## 2018-02-22 LAB — METABOLIC PANEL, COMPREHENSIVE
A-G Ratio: 1.1 (ref 1.1–2.2)
ALT (SGPT): 29 U/L (ref 12–78)
AST (SGOT): 16 U/L (ref 15–37)
Albumin: 3.4 g/dL — ABNORMAL LOW (ref 3.5–5.0)
Alk. phosphatase: 71 U/L (ref 45–117)
Anion gap: 7 mmol/L (ref 5–15)
BUN/Creatinine ratio: 29 — ABNORMAL HIGH (ref 12–20)
BUN: 28 MG/DL — ABNORMAL HIGH (ref 6–20)
Bilirubin, total: 0.2 MG/DL (ref 0.2–1.0)
CO2: 28 mmol/L (ref 21–32)
Calcium: 8.8 MG/DL (ref 8.5–10.1)
Chloride: 96 mmol/L — ABNORMAL LOW (ref 97–108)
Creatinine: 0.97 MG/DL (ref 0.55–1.02)
GFR est AA: >60 mL/min/{1.73_m2} (ref >60)
GFR est non-AA: 57 mL/min/{1.73_m2} — ABNORMAL LOW (ref >60)
Globulin: 3.2 g/dL (ref 2.0–4.0)
Glucose: 123 mg/dL — ABNORMAL HIGH (ref 65–100)
Potassium: 4.6 mmol/L (ref 3.5–5.1)
Protein, total: 6.6 g/dL (ref 6.4–8.2)
Sodium: 131 mmol/L — ABNORMAL LOW (ref 136–145)

## 2018-02-22 LAB — SAMPLES BEING HELD

## 2018-02-22 LAB — GLUCOSE, POC: Glucose (POC): 108 mg/dL — ABNORMAL HIGH (ref 65–100)

## 2018-02-22 MED ORDER — CLOPIDOGREL 75 MG TAB
75 mg | Freq: Every day | ORAL | Status: DC
Start: 2018-02-22 — End: 2018-02-26
  Administered 2018-02-23 – 2018-02-26 (×4): via ORAL

## 2018-02-22 MED ORDER — INSULIN LISPRO 100 UNIT/ML INJECTION
100 unit/mL | Freq: Three times a day (TID) | SUBCUTANEOUS | Status: DC
Start: 2018-02-22 — End: 2018-02-26
  Administered 2018-02-23 – 2018-02-26 (×9): via SUBCUTANEOUS

## 2018-02-22 MED ORDER — PANTOPRAZOLE 40 MG TAB, DELAYED RELEASE
40 mg | Freq: Every day | ORAL | Status: DC
Start: 2018-02-22 — End: 2018-02-26
  Administered 2018-02-23 – 2018-02-26 (×4): via ORAL

## 2018-02-22 MED ORDER — ONDANSETRON (PF) 4 MG/2 ML INJECTION
4 mg/2 mL | INTRAMUSCULAR | Status: AC
Start: 2018-02-22 — End: 2018-02-22
  Administered 2018-02-22: 19:00:00 via INTRAVENOUS

## 2018-02-22 MED ORDER — HYDROMORPHONE (PF) 2 MG/ML IJ SOLN
2 mg/mL | Freq: Once | INTRAMUSCULAR | Status: AC
Start: 2018-02-22 — End: 2018-02-22
  Administered 2018-02-22: 19:00:00 via INTRAVENOUS

## 2018-02-22 MED ORDER — SODIUM CHLORIDE 0.9 % IJ SYRG
Freq: Three times a day (TID) | INTRAMUSCULAR | Status: DC
Start: 2018-02-22 — End: 2018-02-26
  Administered 2018-02-22 – 2018-02-26 (×11): via INTRAVENOUS

## 2018-02-22 MED ORDER — OXYCODONE-ACETAMINOPHEN 5 MG-325 MG TAB
5-325 mg | ORAL | Status: DC | PRN
Start: 2018-02-22 — End: 2018-02-23
  Administered 2018-02-23 (×4): via ORAL

## 2018-02-22 MED ORDER — HYDROCODONE-ACETAMINOPHEN 5 MG-325 MG TAB
5-325 mg | ORAL | Status: DC | PRN
Start: 2018-02-22 — End: 2018-02-24

## 2018-02-22 MED ORDER — SODIUM CHLORIDE 0.9 % IJ SYRG
INTRAMUSCULAR | Status: DC | PRN
Start: 2018-02-22 — End: 2018-02-26

## 2018-02-22 MED ORDER — ASPIRIN 81 MG TAB, DELAYED RELEASE
81 mg | Freq: Every day | ORAL | Status: DC
Start: 2018-02-22 — End: 2018-02-26
  Administered 2018-02-23 – 2018-02-26 (×4): via ORAL

## 2018-02-22 MED ORDER — HYDROMORPHONE (PF) 1 MG/ML IJ SOLN
1 mg/mL | INTRAMUSCULAR | Status: DC | PRN
Start: 2018-02-22 — End: 2018-02-23

## 2018-02-22 MED ORDER — MAGNESIUM OXIDE 400 MG TAB
400 mg | Freq: Every day | ORAL | Status: DC
Start: 2018-02-22 — End: 2018-02-26
  Administered 2018-02-23 – 2018-02-26 (×4): via ORAL

## 2018-02-22 MED ORDER — GLUCOSE 4 GRAM CHEWABLE TAB
4 gram | ORAL | Status: DC | PRN
Start: 2018-02-22 — End: 2018-02-26

## 2018-02-22 MED ORDER — DEXTROSE 50% IN WATER (D50W) IV SYRG
INTRAVENOUS | Status: DC | PRN
Start: 2018-02-22 — End: 2018-02-26

## 2018-02-22 MED ORDER — BUMETANIDE 1 MG TAB
1 mg | Freq: Every day | ORAL | Status: DC
Start: 2018-02-22 — End: 2018-02-26
  Administered 2018-02-23 – 2018-02-26 (×4): via ORAL

## 2018-02-22 MED ORDER — ENOXAPARIN 40 MG/0.4 ML SUB-Q SYRINGE
40 mg/0.4 mL | SUBCUTANEOUS | Status: DC
Start: 2018-02-22 — End: 2018-02-26
  Administered 2018-02-22 – 2018-02-26 (×5): via SUBCUTANEOUS

## 2018-02-22 MED ORDER — SODIUM CHLORIDE 0.9% BOLUS IV
0.9 % | Freq: Once | INTRAVENOUS | Status: AC
Start: 2018-02-22 — End: 2018-02-22
  Administered 2018-02-22: 19:00:00 via INTRAVENOUS

## 2018-02-22 MED ORDER — EZETIMIBE 10 MG TAB
10 mg | Freq: Every day | ORAL | Status: DC
Start: 2018-02-22 — End: 2018-02-26
  Administered 2018-02-23 – 2018-02-26 (×4): via ORAL

## 2018-02-22 MED ORDER — DULOXETINE 60 MG CAP, DELAYED RELEASE
60 mg | Freq: Every day | ORAL | Status: DC
Start: 2018-02-22 — End: 2018-02-26
  Administered 2018-02-23 – 2018-02-26 (×4): via ORAL

## 2018-02-22 MED ORDER — GLUCAGON 1 MG INJECTION
1 mg | INTRAMUSCULAR | Status: DC | PRN
Start: 2018-02-22 — End: 2018-02-26

## 2018-02-22 MED ORDER — NALOXONE 0.4 MG/ML INJECTION
0.4 mg/mL | INTRAMUSCULAR | Status: DC | PRN
Start: 2018-02-22 — End: 2018-02-26

## 2018-02-22 MED ORDER — GABAPENTIN 300 MG CAP
300 mg | Freq: Three times a day (TID) | ORAL | Status: DC
Start: 2018-02-22 — End: 2018-02-26
  Administered 2018-02-22 – 2018-02-26 (×13): via ORAL

## 2018-02-22 MED ORDER — LOSARTAN 25 MG TAB
25 mg | Freq: Every day | ORAL | Status: DC
Start: 2018-02-22 — End: 2018-02-26
  Administered 2018-02-23 – 2018-02-26 (×4): via ORAL

## 2018-02-22 MED ORDER — HYDROCODONE-ACETAMINOPHEN 5 MG-325 MG TAB
5-325 mg | ORAL | Status: DC | PRN
Start: 2018-02-22 — End: 2018-02-22
  Administered 2018-02-22: 22:00:00 via ORAL

## 2018-02-22 MED ORDER — CARVEDILOL 12.5 MG TAB
12.5 mg | Freq: Two times a day (BID) | ORAL | Status: DC
Start: 2018-02-22 — End: 2018-02-26
  Administered 2018-02-22 – 2018-02-26 (×8): via ORAL

## 2018-02-22 MED FILL — GABAPENTIN 300 MG CAP: 300 mg | ORAL | Qty: 1

## 2018-02-22 MED FILL — ONDANSETRON (PF) 4 MG/2 ML INJECTION: 4 mg/2 mL | INTRAMUSCULAR | Qty: 2

## 2018-02-22 MED FILL — HYDROMORPHONE (PF) 1 MG/ML IJ SOLN: 1 mg/mL | INTRAMUSCULAR | Qty: 1

## 2018-02-22 MED FILL — SODIUM CHLORIDE 0.9 % IV: INTRAVENOUS | Qty: 1000

## 2018-02-22 MED FILL — HYDROMORPHONE (PF) 2 MG/ML IJ SOLN: 2 mg/mL | INTRAMUSCULAR | Qty: 1

## 2018-02-22 MED FILL — MONOJECT PREFILL ADVANCED 0.9 % SODIUM CHLORIDE INJECTION SYRINGE: INTRAMUSCULAR | Qty: 40

## 2018-02-22 MED FILL — CARVEDILOL 12.5 MG TAB: 12.5 mg | ORAL | Qty: 2

## 2018-02-22 MED FILL — HYDROCODONE-ACETAMINOPHEN 5 MG-325 MG TAB: 5-325 mg | ORAL | Qty: 1

## 2018-02-22 MED FILL — ENOXAPARIN 40 MG/0.4 ML SUB-Q SYRINGE: 40 mg/0.4 mL | SUBCUTANEOUS | Qty: 0.4

## 2018-02-22 NOTE — Consults (Signed)
[facility]  CONSULTATION    Name:  Kelsey [name], Kelsey  MR#:  [id number]  DOB:  [birth year]  ACCOUNT #:  [id number]  DATE OF SERVICE:  02/24/2018    ORTHOPEDIC CONSULTATION    HISTORY OF PRESENT ILLNESS:  The patient is a 67 year old female who was admitted a few days ago with intractable right hip pain.  Symptoms seemed to be related temporarily to an incident in the gym about three weeks ago.  Pain has been progressive.  Initially, she was having more diffuse pain, but seems to localize, seems more now to the posterolateral hip and distal gluteal region.  She had a trochanteric injection in Dr. Nelta NumbersPahle's office, which did not help.  Since being in the hospital, she has been on steroids and narcotic pain medication.  Mobility has been an issue.  Denies distal radicular pain.    PHYSICAL EXAMINATION:  Appears well.  Antalgic gait witnessed when she was getting from bedside commode back into bed.  Has no pain with motion of lumbar spine.  No paraspinal spasm or tenderness.  No nerve tension signs.  Minimal tenderness laterally over the trochanter.  Has significant ischial tenderness.  Pain in the same area with a little bit of distal radiation with passive stretch of the hamstring, but no true positive straight leg raise test.  Very well preserved hip rotation without pain.  No gross distal motor or sensory deficits.  Symmetrical palpable distal pulses.    IMAGING:  Right hip x-ray is unremarkable.  Recent MRI of the right hip demonstrates mild osteoarthritis.  No abductor tendinopathy.  Mild ischial bursitis evident.    IMPRESSION:  Suspect proximal hamstring tendinopathy and/or ischial bursitis.  She has severe pain.  We will ask Interventional Radiology to inject ischial bursa today.  May mobilize as tolerated with physical therapy.  Apparently, she may need a short rehab stay due to poor mobility.      Sullivan LoneJASON Karrissa Parchment, MD      [id number]/  D:  02/24/2018 8:42  T:  02/24/2018 9:36  JOB #:  [id number]

## 2018-02-22 NOTE — Consults (Signed)
ORTHO CONSULT NOTE    Date of Consultation:  February 22, 2018  Referring Physician:  Gordy Levan  CC: right leg pain    HPI:  Kelsey [name] is a 67 y.o. female PMH CABG, carotid stent, heart valve replacement, PAD with left leg stents, and DM 2 with neuropathy bilat foot who c/o right buttocks and lateral right hip severe aching pain for 3 weeks. The pain started after twisting her right leg to get onto machine at the gym and has worsened over the past few days.  She is worse with ambulation and has a difficult time finding a comfortable position.  She has some right sided lower back pain.  She failed outpatient treatment with her PCP including greater troch injection, PO steroids, and analgesics.  She also failed chiropractor treatments.  She saw Dr. Ernestine Mcmurray Mudrick of [facility] last week who felt the etiology was not her hip but more likely lumbar radiculopathy.  The current pain feels distinctly different than her previous vascular claudication.  She had some groin pain earlier but not currently.    She denies fever, wound/rash, leg numbness (other than chronic foot neuropathy), leg weakness, and bowel/bladder changes (chronic constipation).  She does not normally require an assistive device.  She has suffered a couple of falls over the past 2 weeks, landing on her buttocks.     Previous left THR Dr. Kelli Churn but patient prefers [employer].  She denies previous spine surgery.     Social History     Tobacco Use   ??? Smoking status: Former Smoker     Packs/day: 0.30     Years: 45.00     Pack years: 13.50     Types: Cigarettes   ??? Smokeless tobacco: Never Used   ??? Tobacco comment: quit around the first of November   Substance Use Topics   ??? Alcohol use: Yes     Frequency: Monthly or less     Drinks per session: 1 or 2     Binge frequency: Never     Comment: may be 3 times a year if that     Past Medical History:   Diagnosis Date   ??? Acute MI ([place])     x3   ??? Arthritis    ??? CAD (coronary artery disease)    ???  Cervical spondylolysis    ??? Chronic pain    ??? COPD    ??? Diabetes ([place])    ??? GERD (gastroesophageal reflux disease)    ??? Goiter 04/20/2012   ??? Hypertension    ??? Neuropathy 04/06/2013      Past Surgical History:   Procedure Laterality Date   ??? 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 CARDIAC SURG PROCEDURE UNLIST      PTCA   ??? HX KNEE ARTHROSCOPY Right     X2   ??? HX OTHER SURGICAL  1970    GANGLION CYST   ??? HX TONSILLECTOMY  1963         Allergies   Allergen Reactions   ??? Morphine Anaphylaxis     Tolerates oxycodone without problem   ??? Avalide [Irbesartan-Hydrochlorothiazide] Myalgia   ??? Betadine [Povidone-Iodine] Rash   ??? Demerol [Meperidine] Other (comments)     Makes her feel crazy   ??? Pcn [Penicillins] Swelling   ??? Prinivil [Lisinopril] Cough   ??? Statins-Hmg-Coa Reductase Inhibitors Myalgia   ??? Sulfa (Sulfonamide Antibiotics) Unknown (comments)   ??? Tricor [Fenofibrate Micronized]  Myalgia        Review of Systems:  Per HPI.      Objective:     Patient Vitals for the past 8 hrs:   BP Temp Pulse Resp SpO2 Weight   02/22/18 1712 180/74 97.8 ??F (36.6 ??C) 79 18 97 % ???   02/22/18 1706 ??? ??? 79 ??? ??? ???   02/22/18 1411 151/64 ??? 71 18 96 % ???   02/22/18 1030 149/64 97.4 ??F (36.3 ??C) 71 18 97 % 89.4 kg (197 lb)     Temp (24hrs), Avg:97.6 ??F (36.4 ??C), Min:97.4 ??F (36.3 ??C), Max:97.8 ??F (36.6 ??C)      EXAM:   Mild/mod distress, can't find comfortable position.  Her neck, thoracic, and lumbar spine are non tender.  She is tender right side SI.  She has 5/5 strength for resisted UE motion incl hand intrinsics, wrist, and elbow motion.  SILT bilat UE except for chronic changes right hand.  She has 5/5 strength for resisted left LE motion incl hip, knee, ankle, and toe motion.  She has lateral mid thigh pain with passive log roll of right hip.  She has 4/5 strength for resisted right LE motion incl hip, knee, ankle, and toe motion.  She seems to be SILT for her bilat LE except for chronic foot changes.  I can palpate DP pulses for  each foot, right stronger than left. I can't feel posterior tibial pulses.    Imaging Review:   Results from Hospital Encounter encounter on 02/15/18   XR HIP RT W OR WO PELV 2-3 VWS    Narrative EXAM: XR HIP RT W OR WO PELV 2-3 VWS    INDICATION: Right hip pain.    COMPARISON: None.    FINDINGS: An AP view of the pelvis and a frogleg lateral view of the right hip  demonstrate no fracture, dislocation or other acute abnormality. Degenerative  changes are seen in the right hip joint. The patient is status post left total  hip replacement. No hardware complication. Left iliac stent is noted.      Impression IMPRESSION: Right hip osteoarthritis. No acute abnormality.     MRI lumbar spine:  1. Multilevel degenerative changes detailed by level above. This has not significantly changed except for the L4-L5 disc level where the synovial cyst off the right facet is no longer visualized and canal stenosis at that level has decreased.    Labs:   WBC 8.6, eGFR 57.      Impression:     Patient Active Problem List    Diagnosis Date Noted   ??? Hip pain 02/22/2018   ??? Aortic mural thrombus (HCC) 02/15/2018   ??? Severe obesity with body mass index (BMI) of 35.0 to 39.9 with serious comorbidity ([place]) 06/30/2017   ??? Type 2 diabetes mellitus with nephropathy ([place]) 01/08/2017   ??? Type 2 diabetes mellitus with diabetic neuropathy ([place]) 01/08/2017   ??? Atherosclerosis of aorta ([place]) 01/08/2017   ??? Anemia 10/20/2016   ??? CAD (coronary artery disease) 10/16/2016   ??? 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 Hypertensive urgency 10/16/2016   ??? Long term (current) use of insulin ([place]) 03/04/2016   ??? Hypertension complicating diabetes ([place]) 03/04/2016   ??? Neuropathy 04/06/2013   ??? Goiter 04/20/2012   ??? Dyspnea 03/13/2011   ??? Coronary atherosclerosis of native coronary artery 12/24/2009   ??? Other chest pain 12/24/2009   ??? Other dyspnea and respiratory abnormality 12/24/2009   ??? Mixed hyperlipidemia  12/24/2009   ??? Type II diabetes mellitus, uncontrolled  ([place]) 12/24/2009     Active Problems:    Hip pain (02/22/2018)    Suspected right leg radiculopathy    Plan:   Patient being admitted by PCP.    Rec conservative treatment for now incl IV steroids if no medical contra-indication.  This will increase her blood sugars.    Already taking neurontin 300 TID.    Dr. Kathlene November is on call for [facility] today.  Case discussed with him.  He is in agreement and will ask Dr. Vernell Leep to evaluate patient as well.    Cassandria Anger, PA      Addendum: Damaris Schooner with nurse Geni Bers at 18:30. Patient's pain is uncontrolled with Norco. I adjusted narcotics.  She tolerates PO Percocet.  Sounds like she received IV Dilaudid in ER and tolerated. Allergic to morphine (anaphylaxis).

## 2018-02-22 NOTE — Telephone Encounter (Signed)
Patient is in the ER at [facility] and they are going to do XRays and send her home, Kelsey [name] needs to talk to Dr. Cherly HensenPahle before she leaves today please      Kelsey [name] ph# [phone]

## 2018-02-22 NOTE — ED Triage Notes (Signed)
Patient presents from home with complaints of increase weakness R sided weakness. Patient reports this problem has been getting worse for the last few months

## 2018-02-22 NOTE — ED Notes (Signed)
Ortho at bedside.

## 2018-02-22 NOTE — Progress Notes (Signed)
Bedside and Verbal shift change report given to Zach (oncoming nurse) by Jacqueline (offgoing nurse). Report included the following information SBAR, Kardex, Intake/Output, MAR and Recent Results.

## 2018-02-22 NOTE — ED Notes (Signed)
PT transferred to bed side commode.

## 2018-02-22 NOTE — Progress Notes (Signed)
TRANSFER - IN REPORT:    Verbal report received from Nikki(name) on Kelsey [name]  being received from ED(unit) for routine post - op      Report consisted of patient???s Situation, Background, Assessment and   Recommendations(SBAR).     Information from the following report(s) SBAR, Kardex, Intake/Output, MAR and Recent Results was reviewed with the receiving nurse.    Opportunity for questions and clarification was provided.      Assessment completed upon patient???s arrival to unit and care assumed.

## 2018-02-22 NOTE — Consults (Addendum)
ORTHO CONSULT NOTE    Date of Consultation:  February 22, 2018  Referring Physician:  Gordy Levan  CC: right leg pain    HPI:  Kelsey [name] is a 67 y.o. female PMH CABG, carotid stent, heart valve replacement, PAD with left leg stents, and DM 2 with neuropathy bilat foot who c/o right buttocks and lateral right hip severe aching pain for 3 weeks. The pain started after twisting her right leg to get onto machine at the gym and has worsened over the past few days.  She is worse with ambulation and has a difficult time finding a comfortable position.  She has some right sided lower back pain.  She failed outpatient treatment with her PCP including greater troch injection, PO steroids, and analgesics.  She also failed chiropractor treatments.  She saw Dr. Ernestine Mcmurray Mudrick of [place] last week who felt the etiology was not her hip but more likely lumbar radiculopathy.  The current pain feels distinctly different than her previous vascular claudication.  She had some groin pain earlier but not currently.    She denies fever, wound/rash, leg numbness (other than chronic foot neuropathy), leg weakness, and bowel/bladder changes (chronic constipation).  She does not normally require an assistive device.  She has suffered a couple of falls over the past 2 weeks, landing on her buttocks.     Previous left THR Dr. Kelli Churn but patient prefers [employer].  She denies previous spine surgery.     Social History     Tobacco Use   ??? Smoking status: Former Smoker     Packs/day: 0.30     Years: 45.00     Pack years: 13.50     Types: Cigarettes   ??? Smokeless tobacco: Never Used   ??? Tobacco comment: quit around the first of November   Substance Use Topics   ??? Alcohol use: Yes     Frequency: Monthly or less     Drinks per session: 1 or 2     Binge frequency: Never     Comment: may be 3 times a year if that     Past Medical History:   Diagnosis Date   ??? Acute MI ([place])     x3   ??? Arthritis    ??? CAD (coronary artery disease)     ??? Cervical spondylolysis    ??? Chronic pain    ??? COPD    ??? Diabetes ([place])    ??? GERD (gastroesophageal reflux disease)    ??? Goiter 04/20/2012   ??? Hypertension    ??? Neuropathy 04/06/2013      Past Surgical History:   Procedure Laterality Date   ??? 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 CARDIAC SURG PROCEDURE UNLIST      PTCA   ??? HX KNEE ARTHROSCOPY Right     X2   ??? HX OTHER SURGICAL  1970    GANGLION CYST   ??? HX TONSILLECTOMY  1963         Allergies   Allergen Reactions   ??? Morphine Anaphylaxis     Tolerates oxycodone without problem   ??? Avalide [Irbesartan-Hydrochlorothiazide] Myalgia   ??? Betadine [Povidone-Iodine] Rash   ??? Demerol [Meperidine] Other (comments)     Makes her feel crazy   ??? Pcn [Penicillins] Swelling   ??? Prinivil [Lisinopril] Cough   ??? Statins-Hmg-Coa Reductase Inhibitors Myalgia   ??? Sulfa (Sulfonamide Antibiotics) Unknown (comments)   ??? Tricor [Fenofibrate Micronized]  Myalgia        Review of Systems:  Per HPI.      Objective:     Patient Vitals for the past 8 hrs:   BP Temp Pulse Resp SpO2 Weight   02/22/18 1712 180/74 97.8 ??F (36.6 ??C) 79 18 97 % ???   02/22/18 1706 ??? ??? 79 ??? ??? ???   02/22/18 1411 151/64 ??? 71 18 96 % ???   02/22/18 1030 149/64 97.4 ??F (36.3 ??C) 71 18 97 % 89.4 kg (197 lb)     Temp (24hrs), Avg:97.6 ??F (36.4 ??C), Min:97.4 ??F (36.3 ??C), Max:97.8 ??F (36.6 ??C)      EXAM:   Mild/mod distress, can't find comfortable position.  Her neck, thoracic, and lumbar spine are non tender.  She is tender right side SI.  She has 5/5 strength for resisted UE motion incl hand intrinsics, wrist, and elbow motion.  SILT bilat UE except for chronic changes right hand.  She has 5/5 strength for resisted left LE motion incl hip, knee, ankle, and toe motion.  She has lateral mid thigh pain with passive log roll of right hip.  She has 4/5 strength for resisted right LE motion incl hip, knee, ankle, and toe motion.  She seems to be SILT for her bilat LE except for chronic foot changes.   I can palpate DP pulses for each foot, right stronger than left. I can't feel posterior tibial pulses.    Imaging Review:   Results from Hospital Encounter encounter on 02/15/18   XR HIP RT W OR WO PELV 2-3 VWS    Narrative EXAM: XR HIP RT W OR WO PELV 2-3 VWS    INDICATION: Right hip pain.    COMPARISON: None.    FINDINGS: An AP view of the pelvis and a frogleg lateral view of the right hip  demonstrate no fracture, dislocation or other acute abnormality. Degenerative  changes are seen in the right hip joint. The patient is status post left total  hip replacement. No hardware complication. Left iliac stent is noted.      Impression IMPRESSION: Right hip osteoarthritis. No acute abnormality.     MRI lumbar spine:  1. Multilevel degenerative changes detailed by level above. This has not significantly changed except for the L4-L5 disc level where the synovial cyst off the right facet is no longer visualized and canal stenosis at that level has decreased.    Labs:   WBC 8.6, eGFR 57.      Impression:     Patient Active Problem List    Diagnosis Date Noted   ??? Hip pain 02/22/2018   ??? Aortic mural thrombus (HCC) 02/15/2018   ??? Severe obesity with body mass index (BMI) of 35.0 to 39.9 with serious comorbidity ([place]) 06/30/2017   ??? Type 2 diabetes mellitus with nephropathy ([place]) 01/08/2017   ??? Type 2 diabetes mellitus with diabetic neuropathy ([place]) 01/08/2017   ??? Atherosclerosis of aorta ([place]) 01/08/2017   ??? Anemia 10/20/2016   ??? CAD (coronary artery disease) 10/16/2016   ??? 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 Hypertensive urgency 10/16/2016   ??? Long term (current) use of insulin ([place]) 03/04/2016   ??? Hypertension complicating diabetes ([place]) 03/04/2016   ??? Neuropathy 04/06/2013   ??? Goiter 04/20/2012   ??? Dyspnea 03/13/2011   ??? Coronary atherosclerosis of native coronary artery 12/24/2009   ??? Other chest pain 12/24/2009   ??? Other dyspnea and respiratory abnormality 12/24/2009   ??? Mixed hyperlipidemia  12/24/2009    ??? Type II diabetes mellitus, uncontrolled ([place]) 12/24/2009     Active Problems:    Hip pain (02/22/2018)    Suspected right leg radiculopathy    Plan:   Patient being admitted by PCP.    Rec conservative treatment for now incl IV steroids if no medical contra-indication.  This will increase her blood sugars.    Already taking neurontin 300 TID.    Dr. Kathlene November is on call for [facility] today.  Case discussed with him.  He is in agreement and will ask Dr. Vernell Leep to evaluate patient as well.    Cassandria Anger, PA      Addendum: Damaris Schooner with nurse Geni Bers at 18:30. Patient's pain is uncontrolled with Norco. I adjusted narcotics.  She tolerates PO Percocet.  Sounds like she received IV Dilaudid in ER and tolerated. Allergic to morphine (anaphylaxis).

## 2018-02-22 NOTE — Progress Notes (Signed)
Informed PA Will that pt just arrived to the floor, and her pain is a 9/10. PA Will will order pain medication.

## 2018-02-22 NOTE — Other (Signed)
TRANSFER - OUT REPORT:    Verbal report given to Annice PihJackie, RN(name) on Kelsey [name]  being transferred to 5S(unit) for routine progression of care       Report consisted of patient???s Situation, Background, Assessment and   Recommendations(SBAR).     Information from the following report(s) SBAR, ED Summary, [facility] and Recent Results was reviewed with the receiving nurse.    Lines:   Peripheral IV 02/22/18 Right Antecubital (Active)   Site Assessment Clean, dry, & intact 02/22/2018 11:42 AM   Phlebitis Assessment 0 02/22/2018 11:42 AM   Dressing Status Clean, dry, & intact 02/22/2018 11:42 AM   Dressing Type Transparent 02/22/2018 11:42 AM   Hub Color/Line Status Pink;Capped;Flushed;Patent 02/22/2018 11:42 AM   Action Taken Blood drawn 02/22/2018 11:42 AM        Opportunity for questions and clarification was provided.      Patient transported with:   Transportation

## 2018-02-22 NOTE — ED Provider Notes (Signed)
67 y.o. female with past medical history significant for HTN, CAD, chronic pain, arthritis, COPD, diabetes, GERD, and MI who presents from home via EMS with chief complaint of hip pain. Patient has a h/o chronic low back and right hip pain. On 02/02/18 she recieved a lidocaine injection in her right hip, then had an injection in her right lower back on 02/05/18. One week ago, she saw her PCP again for continued pain, had an X-Ray that showed osteoarthritis, and was given Rx for norco and told to take NSAIDs and F/U with her orthopedist. Patient arrives today reporting continued right hip pain. Patient reports taking norco without significant relief. Patient states over the last 2-3 days, she has been unable to walk and is using a wheelchair for transfers. Patient states she goes to physical therapy once a week. Patient's wife at bedside states they spoke with the patient's PCP this morning, who recommended coming to the ED. Patient denies any other complaints today. There are no other acute medical concerns at this time.    Social hx: Former smoker; Rare EtOH use  PCP: Lake Bells, MD  Orthopedist: Fidela Juneau MD    Note written by Delfino Lovett, Scribe, as dictated by Raynald Kemp, MD 10:49 AM      The history is provided by the patient, the spouse and medical records. No language interpreter was used.        Past Medical History:   Diagnosis Date   ??? Acute MI (HCC)     x3   ??? Arthritis    ??? CAD (coronary artery disease)    ??? Cervical spondylolysis    ??? Chronic pain    ??? COPD    ??? Diabetes (HCC)    ??? GERD (gastroesophageal reflux disease)    ??? Goiter 04/20/2012   ??? Hypertension    ??? Neuropathy 04/06/2013       Past Surgical History:   Procedure Laterality Date   ??? 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 CARDIAC SURG PROCEDURE UNLIST      PTCA   ??? HX KNEE ARTHROSCOPY Right     X2   ??? HX OTHER SURGICAL  1970    GANGLION CYST   ??? HX TONSILLECTOMY  1963         Family History:   Problem Relation Age of Onset    ??? Lung Disease Mother 12   ??? Stroke Father    ??? Lung Disease Brother 13        COPD   ??? Hypertension Brother    ??? Cancer Brother         PROSTATE   ??? Heart Disease Brother    ??? Diabetes Brother    ??? No Known Problems Paternal Grandmother    ??? Anesth Problems Neg Hx        Social History     Socioeconomic History   ??? Marital status: MARRIED     Spouse name: Not on file   ??? Number of children: Not on file   ??? Years of education: Not on file   ??? Highest education level: Not on file   Social Needs   ??? Financial resource strain: Not on file   ??? Food insecurity - worry: Not on file   ??? Food insecurity - inability: Not on file   ??? Transportation needs - medical: Not on file   ??? Transportation needs - non-medical: Not on file  Occupational History   ??? Not on file   Tobacco Use   ??? Smoking status: Former Smoker     Packs/day: 0.30     Years: 45.00     Pack years: 13.50     Types: Cigarettes   ??? Smokeless tobacco: Never Used   ??? Tobacco comment: quit around the first of November   Substance and Sexual Activity   ??? Alcohol use: Yes     Frequency: Monthly or less     Drinks per session: 1 or 2     Binge frequency: Never     Comment: may be 3 times a year if that   ??? Drug use: No   ??? Sexual activity: Not on file   Other Topics Concern   ??? Not on file   Social History Narrative   ??? Not on file         ALLERGIES: Morphine; Avalide [irbesartan-hydrochlorothiazide]; Betadine [povidone-iodine]; Demerol [meperidine]; Pcn [penicillins]; Prinivil [lisinopril]; Statins-hmg-coa reductase inhibitors; Sulfa (sulfonamide antibiotics); and Tricor [fenofibrate micronized]    Review of Systems   Constitutional: Negative for appetite change, chills and fever.   HENT: Negative for rhinorrhea, sore throat and trouble swallowing.    Eyes: Negative for photophobia.   Respiratory: Negative for cough and shortness of breath.    Cardiovascular: Negative for chest pain and palpitations.    Gastrointestinal: Negative for abdominal pain, nausea and vomiting.   Genitourinary: Negative for dysuria, frequency and hematuria.   Musculoskeletal: Positive for arthralgias, back pain and gait problem.   Neurological: Negative for dizziness, syncope and weakness.   Psychiatric/Behavioral: Negative for behavioral problems. The patient is not nervous/anxious.    All other systems reviewed and are negative.      Vitals:    02/22/18 1030   BP: 149/64   Pulse: 71   Resp: 18   Temp: 97.4 ??F (36.3 ??C)   SpO2: 97%   Weight: 89.4 kg (197 lb)            Physical Exam   Constitutional: She appears well-developed and well-nourished. She appears lethargic.   HENT:   Head: Normocephalic and atraumatic.   Mouth/Throat: Oropharynx is clear and moist.   Eyes: EOM are normal. Pupils are equal, round, and reactive to light.   Neck: Normal range of motion. Neck supple.   Cardiovascular: Normal rate, regular rhythm, normal heart sounds and intact distal pulses. Exam reveals no gallop and no friction rub.   No murmur heard.  Pulmonary/Chest: Effort normal. No respiratory distress. She has no wheezes. She has no rales.   Abdominal: Soft. There is no tenderness. There is no rebound.   Musculoskeletal: Normal range of motion. She exhibits no tenderness.        Right hip: She exhibits normal range of motion, no swelling, no crepitus and no deformity.   Right hip without pain with passive ROM.   Neurological: She appears lethargic. No cranial nerve deficit or sensory deficit.   Motor; symmetric. Patient appears very sleepy with slurred speech.   Skin: No erythema.   Psychiatric: She has a normal mood and affect. Her behavior is normal.   Nursing note and vitals reviewed.     Note written by Delfino Lovett, Scribe, as dictated by Raynald Kemp, MD 10:49 AM    MDM       Procedures    CONSULT NOTE:  11:00 AM Raynald Kemp, MD spoke with Dr. Cherly Hensen, Consult for PCP.  Discussed available diagnostic tests and  clinical findings.  Dr. Cherly HensenPahle  recommends getting an ortho consult, ordering MRI, and she will admit the patient.    2:49 PM  Will Polhamus, Ortho PA, in the ED to see the patient.     3:36 PM  Dr. Cherly HensenPahle in the ED to see the patient, patient is in MRI currently.

## 2018-02-22 NOTE — Progress Notes (Signed)
Physical Therapy Screening:  A referral to physical therapy order is indicated when the patient is medically stable.    A screening was performed on this patient's chart based on their entrance into the emergency department and potential need for physical therapy identified based on TRST 3 & difficulty walking. The patient???s chart was reviewed and the patient interviewed/screened and found to be appropriate for a skilled therapy evaluation. Please consult for physical therapy if you would like an evaluation to be completed. Thank you.    1226 - Met with patient and her wife Jacqlyn Larsen.  Patient lives with her wife in a two story home with a few steps and handrail to enter.  Bedroom is on the second floor of the home.  Prior to Thursday 02/19/28, patient was ambulatory with a cane with difficulty related to pain (back, RLE) and has had several falls with the most occurring less than a week ago.  Patient's pain progressed to the point where she is unable to walk and having to rely on a wheelchair.

## 2018-02-22 NOTE — ED Notes (Signed)
[facility] family member at bedside.

## 2018-02-22 NOTE — Progress Notes (Signed)
Ortho:  Patient taking PO abx for UTI so I'll defer to PCP if IV steroids are appropriate.  I TigerTexted Dr Cherly HensenPahle at 18:00 but no response thus far.    Lianne CureWilliam R Desmond Tufano, PA

## 2018-02-22 NOTE — ED Notes (Signed)
Bedside and Verbal shift change report given to Niki RN (oncoming nurse) by Sarah RN (offgoing nurse). Report included the following information SBAR, Kardex, ED Summary, MAR and Recent Results.

## 2018-02-22 NOTE — Progress Notes (Signed)
Admission Medication Reconciliation:    Information obtained from:  patient's wife, insurance claim information, chart review    Comments/Recommendations:   All medications/allergies have been reviewed and updated; last medication administration times reviewed and recorded.  The patient was in significant pain at time of interview, so medications were discussed with the patient's wife who brought a medication list from home.  All medications were confirmed via insurance claim information.     Of note, the pt's wife reports that she takes carvedilol 25 mg BID; however, per insurance claim information and chart review patient was taking 12.5 mg.      Changes made to Prior to Admission (PTA) Medication List:   ?   Medications Added:   - cephalexin, diazepam  ?   Medications Changed:   - hydrocodone changed from 1 tab Q8H PRN to 2 tab Q6H PRN  - carvedilol changed from 12.5 mg BID to 25 mg BID  - nubumetone changed to BID PRN  ?   Medications Removed:   - magnesium oxide 400 mg daily  - Medrol dose pack started 2/23, now complete  - potassium chloride 10 mEq daily  - pravastatin 40 mg QHS       Significant PMH/Disease States:   Past Medical History:   Diagnosis Date   ??? Acute MI (HCC)     x3   ??? Arthritis    ??? CAD (coronary artery disease)    ??? Cervical spondylolysis    ??? Chronic pain    ??? COPD    ??? Diabetes (HCC)    ??? GERD (gastroesophageal reflux disease)    ??? Goiter 04/20/2012   ??? Hypertension    ??? Neuropathy 04/06/2013       Chief Complaint for this Admission:    Chief Complaint   Patient presents with   ??? Hip Pain       Allergies:  Morphine; Avalide [irbesartan-hydrochlorothiazide]; Betadine [povidone-iodine]; Demerol [meperidine]; Pcn [penicillins]; Prinivil [lisinopril]; Statins-hmg-coa reductase inhibitors; Sulfa (sulfonamide antibiotics); and Tricor [fenofibrate micronized]    Prior to Admission Medications:   Prior to Admission Medications   Prescriptions Last Dose Informant Patient Reported? Taking?    CHANTIX CONTINUING MONTH BOX 1 mg tablet 02/21/2018 at Unknown time  No Yes   Sig: TAKE 1 TABLET BY MOUTH TWICE A DAY   DULoxetine (CYMBALTA) 60 mg capsule 02/21/2018 at Unknown time  No Yes   Sig: TAKE 1 CAPSULE BY MOUTH DAILY   HYDROcodone-acetaminophen (NORCO) 7.5-325 mg per tablet   Yes Yes   Sig: Take 2 Tabs by mouth every six (6) hours as needed for Pain.   LEVEMIR FLEXTOUCH U-100 INSULN 100 unit/mL (3 mL) inpn 02/21/2018 at Unknown time  No Yes   Sig: INJECT 34 UNITS SUBCUTANEOUSLY AT BEDTIME DX: 250.00   VESICARE 10 mg tablet 02/21/2018 at Unknown time  No Yes   Sig: TAKE 1 TABLET BY MOUTH DAILY.   VITAMIN D3 2,000 unit cap capsule 02/21/2018 at Unknown time  No Yes   Sig: TAKE ONE CAPSULE BY MOUTH DAILY   acetaminophen (ACETAMINOPHEN EXTRA STRENGTH) 500 mg tablet   Yes No   Sig: Take 1,000 mg by mouth every six (6) hours. With tramadol   aspirin delayed-release 81 mg tablet 02/21/2018 at Unknown time  No Yes   Sig: TAKE 1 TAB BY MOUTH DAILY.   bumetanide (BUMEX) 2 mg tablet 02/21/2018 at Unknown time  Yes Yes   Sig: Take 2 mg by mouth daily.   carvedilol (COREG)  25 mg tablet   Yes Yes   Sig: Take 25 mg by mouth two (2) times daily (with meals).   cephALEXin (KEFLEX) 500 mg capsule   Yes Yes   Sig: Take 500 mg by mouth four (4) times daily.   clopidogrel (PLAVIX) 75 mg tab 02/21/2018 at Unknown time  No Yes   Sig: TAKE 1 TABLET BY MOUTH DAILY   diazePAM (VALIUM) 10 mg tablet   Yes Yes   Sig: Take 10 mg by mouth every eight (8) hours as needed for Anxiety.   ezetimibe (ZETIA) 10 mg tablet 02/21/2018 at Unknown time  No Yes   Sig: TAKE 1 TAB BY MOUTH NIGHTLY.   ferrous sulfate 325 mg (65 mg iron) tablet 02/21/2018 at Unknown time  No Yes   Sig: TAKE 1 TABLET BY MOUTH DAILY   gabapentin (NEURONTIN) 300 mg capsule 02/21/2018 at Unknown time  Yes Yes   Sig: Take 1,200 mg by mouth daily.   insulin lispro (HUMALOG U-100 INSULIN) 100 unit/mL injection   Yes No   Sig: by SubCUTAneous route. 0-8 units three times a day as needed    losartan (COZAAR) 25 mg tablet 02/21/2018 at Unknown time  No Yes   Sig: TAKE 1 TAB BY MOUTH DAILY.   magnesium oxide (MAG-OX) 400 mg tablet 02/21/2018 at Unknown time  No Yes   Sig: Take 1 Tab by mouth daily.   metFORMIN ER (GLUCOPHAGE XR) 500 mg tablet 02/21/2018 at Unknown time  Yes Yes   Sig: Take 1,000 mg by mouth two (2) times daily (after meals).   nabumetone (RELAFEN) 500 mg tablet   Yes No   Sig: Take 500 mg by mouth two (2) times daily as needed for Pain.   nitroglycerin (NITROSTAT) 0.4 mg SL tablet   No No   Sig: 1 Tab by SubLINGual route every five (5) minutes as needed for Chest Pain for up to 3 doses.   pantoprazole (PROTONIX) 40 mg tablet 02/21/2018 at Unknown time  No Yes   Sig: TAKE 1 TABLET BY MOUTH DAILY      Facility-Administered Medications: None       Thank you for allowing pharmacy to participate in the coordination of this patient's care.  If you have any other questions, please contact the medication reconciliation pharmacist at x [phone].       Curlene LabrumJohanna Behrens, Pharm.D., BCPS

## 2018-02-22 NOTE — Progress Notes (Signed)
Ortho:    Attempted to see patient in ER but she is currently in MRI for L-spine and right hip.  Will try again later.    Lianne CureWilliam R Jamiyla Ishee, PA

## 2018-02-22 NOTE — ED Notes (Signed)
Patient headed to MRI at this time

## 2018-02-22 NOTE — H&P (Signed)
History and Physical    Subjective:     Kelsey [name] is a 67 y.o. Caucasian female who presents with unrelenting right hip pain.  Failed steroid dose pak, right greater troch and si injections in the office.  The only thing that touches the pain is high dose narcotics.  She is in pain sitting, lying down and standing.  unable to bear weight on right leg..   Past Medical History:   Diagnosis Date   ??? Acute MI ([place])     x3   ??? Arthritis    ??? CAD (coronary artery disease)    ??? Cervical spondylolysis    ??? Chronic pain    ??? COPD    ??? Diabetes ([place])    ??? GERD (gastroesophageal reflux disease)    ??? Goiter 04/20/2012   ??? Hypertension    ??? Neuropathy 04/06/2013     Allergies   Allergen Reactions   ??? Morphine Anaphylaxis     Tolerates oxycodone without problem   ??? Avalide [Irbesartan-Hydrochlorothiazide] Myalgia   ??? Betadine [Povidone-Iodine] Rash   ??? Demerol [Meperidine] Other (comments)     Makes her feel crazy   ??? Pcn [Penicillins] Swelling   ??? Prinivil [Lisinopril] Cough   ??? Statins-Hmg-Coa Reductase Inhibitors Myalgia   ??? Sulfa (Sulfonamide Antibiotics) Unknown (comments)   ??? Tricor [Fenofibrate Micronized] Myalgia     Prior to Admission medications    Medication Sig Start Date End Date Taking? Authorizing Provider   carvedilol (COREG) 25 mg tablet Take 25 mg by mouth two (2) times daily (with meals).   Yes Provider, Historical   HYDROcodone-acetaminophen (NORCO) 7.5-325 mg per tablet Take 2 Tabs by mouth every six (6) hours as needed for Pain.   Yes Provider, Historical   metFORMIN ER (GLUCOPHAGE XR) 500 mg tablet Take 1,000 mg by mouth two (2) times daily (after meals).   Yes Provider, Historical   cephALEXin (KEFLEX) 500 mg capsule Take 500 mg by mouth four (4) times daily. 02/18/18 02/25/18 Yes Provider, Historical   diazePAM (VALIUM) 10 mg tablet Take 10 mg by mouth every eight (8) hours as needed for Anxiety.   Yes Provider, Historical   pantoprazole (PROTONIX) 40 mg tablet TAKE 1 TABLET BY MOUTH DAILY 01/26/18   Yes Tanis Hensarling, Candiss Norse, MD   ferrous sulfate 325 mg (65 mg iron) tablet TAKE 1 TABLET BY MOUTH DAILY 12/23/17  Yes Telford Nab, MD   losartan (COZAAR) 25 mg tablet TAKE 1 TAB BY MOUTH DAILY. 12/06/17  Yes Delpha Perko, Candiss Norse, MD   CHANTIX CONTINUING MONTH BOX 1 mg tablet TAKE 1 TABLET BY MOUTH TWICE A DAY 11/23/17  Yes Genene Churn, MD   clopidogrel (PLAVIX) 75 mg tab TAKE 1 TABLET BY MOUTH DAILY 11/02/17  Yes Valinda Hoar, MD   magnesium oxide (MAG-OX) 400 mg tablet Take 1 Tab by mouth daily. 09/29/17  Yes Telford Nab, MD   DULoxetine (CYMBALTA) 60 mg capsule TAKE 1 CAPSULE BY MOUTH DAILY 09/24/17  Yes Satara Virella, Candiss Norse, MD   VESICARE 10 mg tablet TAKE 1 TABLET BY MOUTH DAILY. 08/28/17  Yes Genene Churn, MD   VITAMIN D3 2,000 unit cap capsule TAKE ONE CAPSULE BY MOUTH DAILY 06/23/17  Yes Diefenderfer, Pedro Earls, NP   aspirin delayed-release 81 mg tablet TAKE 1 TAB BY MOUTH DAILY. 06/23/17  Yes Diefenderfer, Pedro Earls, NP   ezetimibe (ZETIA) 10 mg tablet TAKE 1 TAB BY MOUTH NIGHTLY. 04/23/17  Yes Jewel Baize  J, MD   LEVEMIR FLEXTOUCH U-100 INSULN 100 unit/mL (3 mL) inpn INJECT 34 UNITS SUBCUTANEOUSLY AT BEDTIME DX: 250.00 03/21/17  Yes Columbia Pandey, Candiss Norse, MD   gabapentin (NEURONTIN) 300 mg capsule Take 1,200 mg by mouth daily.   Yes Provider, Historical   bumetanide (BUMEX) 2 mg tablet Take 2 mg by mouth daily.   Yes Provider, Historical   nabumetone (RELAFEN) 500 mg tablet Take 500 mg by mouth two (2) times daily as needed for Pain.    Provider, Historical   insulin lispro (HUMALOG U-100 INSULIN) 100 unit/mL injection by SubCUTAneous route. 0-8 units three times a day as needed    Provider, Historical   acetaminophen (ACETAMINOPHEN EXTRA STRENGTH) 500 mg tablet Take 1,000 mg by mouth every six (6) hours. With tramadol    Provider, Historical   nitroglycerin (NITROSTAT) 0.4 mg SL tablet 1 Tab by SubLINGual route every five (5) minutes as needed for Chest Pain for up to 3 doses. 09/04/16    Diefenderfer, Pedro Earls, NP     Social History     Tobacco Use   ??? Smoking status: Former Smoker     Packs/day: 0.30     Years: 45.00     Pack years: 13.50     Types: Cigarettes   ??? Smokeless tobacco: Never Used   ??? Tobacco comment: quit around the first of November   Substance Use Topics   ??? Alcohol use: Yes     Frequency: Monthly or less     Drinks per session: 1 or 2     Binge frequency: Never     Comment: may be 3 times a year if that     Family History   Problem Relation Age of Onset   ??? Lung Disease Mother 28   ??? Stroke Father    ??? Lung Disease Brother 62        COPD   ??? Hypertension Brother    ??? Cancer Brother         PROSTATE   ??? Heart Disease Brother    ??? Diabetes Brother    ??? No Known Problems Paternal Grandmother    ??? Anesth Problems Neg Hx                Review of Systems:  As above.    Objective:       Physical Exam: 67 yo wf-   In moderate distress. Very irritable, not oriented to day.Marland Kitchen  HEENT -- Pupils round.  O/P Clear.  Neck -- Supple. No JVD.  Heart -- RRR. 2/6 asm  Lungs -- Coarse breath sounds  Abdomen -- Soft. Non-tender. Non-distended. No masses. Bowel sounds present.  Extremities -- No edema. + 1 pedal pulses bilat, right hip tender over greater troch, unable to move leg without pain.         Data Review:   Recent Results (from the past 24 hour(s))   CBC WITH AUTOMATED DIFF    Collection Time: 02/22/18 11:28 AM   Result Value Ref Range    WBC 8.6 3.6 - 11.0 K/uL    RBC 4.37 3.80 - 5.20 M/uL    HGB 12.9 11.5 - 16.0 g/dL    HCT 39.0 35.0 - 47.0 %    MCV 89.2 80.0 - 99.0 FL    MCH 29.5 26.0 - 34.0 PG    MCHC 33.1 30.0 - 36.5 g/dL    RDW 13.2 11.5 - 14.5 %    PLATELET 249 150 -  400 K/uL    MPV 10.0 8.9 - 12.9 FL    NRBC 0.0 0 PER 100 WBC    ABSOLUTE NRBC 0.00 0.00 - 0.01 K/uL    NEUTROPHILS 67 32 - 75 %    LYMPHOCYTES 23 12 - 49 %    MONOCYTES 8 5 - 13 %    EOSINOPHILS 1 0 - 7 %    BASOPHILS 1 0 - 1 %    IMMATURE GRANULOCYTES 0 0.0 - 0.5 %    ABS. NEUTROPHILS 5.8 1.8 - 8.0 K/UL     ABS. LYMPHOCYTES 2.0 0.8 - 3.5 K/UL    ABS. MONOCYTES 0.7 0.0 - 1.0 K/UL    ABS. EOSINOPHILS 0.1 0.0 - 0.4 K/UL    ABS. BASOPHILS 0.0 0.0 - 0.1 K/UL    ABS. IMM. GRANS. 0.0 0.00 - 0.04 K/UL    DF AUTOMATED     METABOLIC PANEL, COMPREHENSIVE    Collection Time: 02/22/18 11:28 AM   Result Value Ref Range    Sodium 131 (L) 136 - 145 mmol/L    Potassium 4.6 3.5 - 5.1 mmol/L    Chloride 96 (L) 97 - 108 mmol/L    CO2 28 21 - 32 mmol/L    Anion gap 7 5 - 15 mmol/L    Glucose 123 (H) 65 - 100 mg/dL    BUN 28 (H) 6 - 20 MG/DL    Creatinine 0.97 0.55 - 1.02 MG/DL    BUN/Creatinine ratio 29 (H) 12 - 20      GFR est AA >60 >60 ml/min/1.89m    GFR est non-AA 57 (L) >60 ml/min/1.710m   Calcium 8.8 8.5 - 10.1 MG/DL    Bilirubin, total 0.2 0.2 - 1.0 MG/DL    ALT (SGPT) 29 12 - 78 U/L    AST (SGOT) 16 15 - 37 U/L    Alk. phosphatase 71 45 - 117 U/L    Protein, total 6.6 6.4 - 8.2 g/dL    Albumin 3.4 (L) 3.5 - 5.0 g/dL    Globulin 3.2 2.0 - 4.0 g/dL    A-G Ratio 1.1 1.1 - 2.2     SAMPLES BEING HELD    Collection Time: 02/22/18 11:28 AM   Result Value Ref Range    SAMPLES BEING HELD 1RED 1BLU     COMMENT        Add-on orders for these samples will be processed based on acceptable specimen integrity and analyte stability, which may vary by analyte.   GLUCOSE, POC    Collection Time: 02/22/18  5:09 PM   Result Value Ref Range    Glucose (POC) 108 (H) 65 - 100 mg/dL    Performed by FLECK ELLIOTT KATHLEEN        Chest x-ray    EKG  Assessment:     Active Problems:    Hip pain (02/22/2018)        Plan:     Hip pain- pain management per ortho. Await mri of hip.  Phys therapy consult  Diabetes- hold metformin- use ssi  Hypertension- continue meds.    Signed By: NaGenene ChurnMD     February 22, 2018

## 2018-02-23 ENCOUNTER — Observation Stay: Admit: 2018-02-23 | Payer: MEDICARE | Primary: Internal Medicine

## 2018-02-23 ENCOUNTER — Observation Stay

## 2018-02-23 LAB — METABOLIC PANEL, COMPREHENSIVE
A-G Ratio: 1.1 (ref 1.1–2.2)
ALT (SGPT): 29 U/L (ref 12–78)
AST (SGOT): 15 U/L (ref 15–37)
Albumin: 3.2 g/dL — ABNORMAL LOW (ref 3.5–5.0)
Alk. phosphatase: 66 U/L (ref 45–117)
Anion gap: 6 mmol/L (ref 5–15)
BUN/Creatinine ratio: 26 — ABNORMAL HIGH (ref 12–20)
BUN: 24 MG/DL — ABNORMAL HIGH (ref 6–20)
Bilirubin, total: 0.2 MG/DL (ref 0.2–1.0)
CO2: 27 mmol/L (ref 21–32)
Calcium: 9.1 MG/DL (ref 8.5–10.1)
Chloride: 100 mmol/L (ref 97–108)
Creatinine: 0.91 MG/DL (ref 0.55–1.02)
GFR est AA: >60 mL/min/{1.73_m2} (ref >60)
GFR est non-AA: >60 mL/min/{1.73_m2} (ref >60)
Globulin: 3 g/dL (ref 2.0–4.0)
Glucose: 156 mg/dL — ABNORMAL HIGH (ref 65–100)
Potassium: 5 mmol/L (ref 3.5–5.1)
Protein, total: 6.2 g/dL — ABNORMAL LOW (ref 6.4–8.2)
Sodium: 133 mmol/L — ABNORMAL LOW (ref 136–145)

## 2018-02-23 LAB — GLUCOSE, POC
Glucose (POC): 140 mg/dL — ABNORMAL HIGH (ref 65–100)
Glucose (POC): 152 mg/dL — ABNORMAL HIGH (ref 65–100)

## 2018-02-23 MED ORDER — INSULIN GLARGINE 100 UNIT/ML INJECTION
100 unit/mL | Freq: Every evening | SUBCUTANEOUS | Status: DC
Start: 2018-02-23 — End: 2018-02-25
  Administered 2018-02-24 – 2018-02-25 (×2): via SUBCUTANEOUS

## 2018-02-23 MED ORDER — METHYLPREDNISOLONE (PF) 40 MG/ML IJ SOLR
40 mg/mL | Freq: Four times a day (QID) | INTRAMUSCULAR | Status: DC
Start: 2018-02-23 — End: 2018-02-24
  Administered 2018-02-24 (×3): via INTRAVENOUS

## 2018-02-23 MED ORDER — OXYCODONE-ACETAMINOPHEN 5 MG-325 MG TAB
5-325 mg | ORAL | Status: DC | PRN
Start: 2018-02-23 — End: 2018-02-24
  Administered 2018-02-24 (×2): via ORAL

## 2018-02-23 MED FILL — BUMETANIDE 1 MG TAB: 1 mg | ORAL | Qty: 2

## 2018-02-23 MED FILL — DULOXETINE 60 MG CAP, DELAYED RELEASE: 60 mg | ORAL | Qty: 1

## 2018-02-23 MED FILL — OXYCODONE-ACETAMINOPHEN 5 MG-325 MG TAB: 5-325 mg | ORAL | Qty: 2

## 2018-02-23 MED FILL — INSULIN LISPRO 100 UNIT/ML INJECTION: 100 unit/mL | SUBCUTANEOUS | Qty: 1

## 2018-02-23 MED FILL — LOSARTAN 25 MG TAB: 25 mg | ORAL | Qty: 1

## 2018-02-23 MED FILL — MAGNESIUM OXIDE 400 MG TAB: 400 mg | ORAL | Qty: 1

## 2018-02-23 MED FILL — GABAPENTIN 300 MG CAP: 300 mg | ORAL | Qty: 1

## 2018-02-23 MED FILL — PANTOPRAZOLE 40 MG TAB, DELAYED RELEASE: 40 mg | ORAL | Qty: 1

## 2018-02-23 MED FILL — CARVEDILOL 12.5 MG TAB: 12.5 mg | ORAL | Qty: 2

## 2018-02-23 MED FILL — OXYCODONE-ACETAMINOPHEN 5 MG-325 MG TAB: 5-325 mg | ORAL | Qty: 1

## 2018-02-23 MED FILL — ENOXAPARIN 40 MG/0.4 ML SUB-Q SYRINGE: 40 mg/0.4 mL | SUBCUTANEOUS | Qty: 0.4

## 2018-02-23 MED FILL — ASPIRIN 81 MG TAB, DELAYED RELEASE: 81 mg | ORAL | Qty: 1

## 2018-02-23 MED FILL — CLOPIDOGREL 75 MG TAB: 75 mg | ORAL | Qty: 1

## 2018-02-23 MED FILL — NORMAL SALINE FLUSH 0.9 % INJECTION SYRINGE: INTRAMUSCULAR | Qty: 10

## 2018-02-23 MED FILL — EZETIMIBE 10 MG TAB: 10 mg | ORAL | Qty: 1

## 2018-02-23 NOTE — Progress Notes (Signed)
Notified Dr. Cherly HensenPahle of patient fall and that patient IV access was lost shortly after.  Per Dr. Cherly HensenPahle new IV access was not necessary for time being.  No further orders given.  Will continue to monitor patient.

## 2018-02-23 NOTE — Progress Notes (Signed)
Problem: Mobility Impaired (Adult and Pediatric)  Goal: *Acute Goals and Plan of Care (Insert Text)  Physical Therapy Goals  Initiated 02/23/2018  1.  Patient will move from supine to sit and sit to supine , scoot up and down and roll side to side in bed with independence within 7 day(s).    2.  Patient will transfer from bed to chair and chair to bed with supervision/set-up using the least restrictive device within 7 day(s).  3.  Patient will perform sit to stand with supervision/set-up within 7 day(s).  4.  Patient will ambulate with supervision/set-up for 200 feet with the least restrictive device within 7 day(s).   5.  Patient will ascend/descend 15 stairs with 2 handrail(s) with minimal assistance/contact guard assist within 7 day(s).    physical Therapy EVALUATION  Patient: Kelsey [name] (67 y.o. female)  Date: 02/23/2018  Primary Diagnosis: Hip pain [M25.559]       Precautions:   Fall    ASSESSMENT :   Based on the objective data described below, the patient presented with minimal assistance level overall for transfers. Patient with significant pain with RLE weight bearing, tolerated some weight shifting/marching and 2 sidesteps up to [facility] with heavy RW support and MIN A.  Patient very drowsy during session, needed cues to attend but appropriate interaction once prompted.  Education reinforced regarding fall risk (patient fell this am trying to go to bathroom without assistance) and need to call for help.  Patient still awaiting MRI results to r/o AVN, recommend OOB only to [facility] with nursing at this time until results in.  She lives with her wife in a 2 level home, bedroom upstairs.  Disposition will depend heavily on results of tests and progress/pain control.]  The following are barriers to independence while in acute care:   -Cognitive and/or behavioral: attention to task, safety awareness and insight into deficits  -Medical condition: ROM, strength, functional endurance, standing balance,  WB status and pain tolerance    -Other:       The patient will benefit from skilled acute intervention to address the above impairments and their rehabilitation potential is considered to be Excellent    Discharge recommendations: Rehab at inpatient facility: patient can tolerate 3 hours of therapy , Home health (to increase independence and safety), To be determined between the prior selections, based on patient progress during hospital stay and None  Patient's barriers to discharging home, in addition to above impairments: history of falls home environment unsafe due to bedroom on second level level of physical assist required to maintain patient safety.     Equipment recommendations for successful discharge (if) home: TBD     PLAN :  Recommendations and Planned Interventions: stairs, bed mobility training, transfer training, gait training, exercises, patient and family training/education and sit<>stand      Frequency/Duration: Patient will be followed by physical therapy  5 times a week to address goals.     SUBJECTIVE:   Patient stated ???It's better than earlier but still hurting badly.???    OBJECTIVE DATA SUMMARY:   HISTORY:    Past Medical History:   Diagnosis Date   ??? Acute MI ([facility])     x3   ??? Arthritis    ??? CAD (coronary artery disease)    ??? Cervical spondylolysis    ??? Chronic pain    ??? COPD    ??? Diabetes ([facility])    ??? GERD (gastroesophageal reflux disease)    ??? Goiter 04/20/2012   ???  Hypertension    ??? Neuropathy 04/06/2013     Past Surgical History:   Procedure Laterality Date   ??? 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 CARDIAC SURG PROCEDURE UNLIST      PTCA   ??? HX KNEE ARTHROSCOPY Right     X2   ??? HX OTHER SURGICAL  1970    GANGLION CYST   ??? HX TONSILLECTOMY  1963     Prior Level of Function/Home Situation: lives with wife in 2 level home, was indep PTA and worked out in gym until injured her leg getting on/off machine  Personal factors and/or comorbidities impacting plan of care: prior L THR in OCt 2018     Home Situation  Home Environment: Private residence  # Steps to Enter: 5  Rails to Enter: Yes  Hand Rails : Bilateral  One/Two Story Residence: Two Nutritional therapist of Interior Steps: 15  Interior Rails: Both  Living Alone: No  Support Systems: Games developer  Patient Expects to be Discharged to:: Private residence  Current DME Used/Available at Home: Cane, straight, Crutches, Raised toilet seat, Paediatric nurse, Environmental consultant, rolling  Tub or Shower Type: Shower  EXAMINATION/PRESENTATION/DECISION MAKING: Critical Behavior:  Neurologic State: Drowsy  Orientation Level: Oriented X4  Cognition: Appropriate for age attention/concentration, Follows commands     Hearing:  Auditory  Auditory Impairment: None  Range Of Motion:  AROM: Generally decreased, functional(R hip limited by pain but moving through most of range)                       Strength:    Strength: Generally decreased, functional(R hip limited by pain)                    Tone & Sensation:   Tone: Normal              Sensation: Intact               Coordination:  Coordination: Within functional limits  Vision:      Functional Mobility:  Bed Mobility:     Supine to Sit: Contact guard assistance  Sit to Supine: Minimum assistance(for RLE)     Transfers:  Sit to Stand: Minimal assistance  Stand to Sit: Minimal assistance                       Balance:   Sitting: Intact  Standing: Impaired  Standing - Static: Constant support;Fair  Standing - Dynamic : FairAmbulation/Gait Training:            Gait Description (WDL): (sidesteps at [facility] only, MIN A)                       Therapeutic Exercises:   Instructed patient in heel slides, quad sets and hip abd/add AROM in bed.  Could not tolerate hip EROT stretch    Functional Measure:  Timed up and go:    Timed Get Up And Go Test: (unable)      < than 10 seconds=Normal  Greater then 13.5 seconds (in elderly)=Increased fall risk   Shumway-Cook A, Johnnette Gourd, [place] M. Predicting the probability for  falls in community dwelling older adults using the Timed Up and Go Test. Phys Ther. 2000;80:896-903.              Physical Therapy Evaluation Charge Determination   History Examination Presentation Decision-Making  MEDIUM  Complexity : 1-2 comorbidities / personal factors will impact the outcome/ POC  MEDIUM Complexity : 3 Standardized tests and measures addressing body structure, function, activity limitation and / or participation in recreation  MEDIUM Complexity : Evolving with changing characteristics  Other outcome measures TUG unable  HIGH       Based on the above components, the patient evaluation is determined to be of the following complexity level: MEDIUM    Pain:  Pre treatment: 5 /10   During treatment: 7/10  Post treatment:  5/10   Location: R hip    Description:sharp and throbbing   Aggravating factors:     Activity Tolerance:   Poor  Please refer to the flowsheet for vital signs taken during this treatment.    After treatment patient left:   Supine in bed  Bed alarm/tab alert on  Bed in low position  Caregiver at bedside  Call light within reach  RN notified     COMMUNICATION/EDUCATION:   The patient???s plan of care was discussed with: Registered Nurse.    Fall prevention education was provided and the patient/caregiver indicated understanding. and Patient/family agree to work toward stated goals and plan of care.    Thank you for this referral.  Kelvin CellarAmanda J Gallagher, PT   Time Calculation: 25 mins

## 2018-02-23 NOTE — Progress Notes (Signed)
Problem: Falls - Risk of  Goal: *Absence of Falls  Document Schmid Fall Risk and appropriate interventions in the flowsheet.  Outcome: Progressing Towards Goal  Fall Risk Interventions:  Mobility Interventions: Patient to call before getting OOB         Medication Interventions: Patient to call before getting OOB    Elimination Interventions: Call light in reach    History of Falls Interventions: Evaluate medications/consider consulting pharmacy

## 2018-02-23 NOTE — Progress Notes (Signed)
Medical Progress Note      NAME: Kelsey [name]   DOB:  30-Apr-1951  MRM:  [id number]    Date/Time: 02/23/2018  11:26 AM    Problem List:   Active Problems:    Hip pain (02/22/2018)           Subjective:     Patient feeling better ( on high dose narcotics). Denies nauea,    Past Medical History:   Diagnosis Date   ??? Acute MI ([facility])     x3   ??? Arthritis    ??? CAD (coronary artery disease)    ??? Cervical spondylolysis    ??? Chronic pain    ??? COPD    ??? Diabetes (H[facility])    ??? GERD (gastroesophageal reflux disease)    ??? Goiter 04/20/2012   ??? Hypertension    ??? Neuropathy 04/06/2013       ROS:  General: negative for fever, chills, sweats, weakness         Objective:       Vitals:          Last 24hrs VS reviewed since prior progress note. Most recent are:    Visit Vitals  BP 183/80 (BP 1 Location: Right arm)   Pulse 70   Temp 97.3 ??F (36.3 ??C)   Resp 16   Ht 5\' 4"  (1.626 m)   Wt 197 lb (89.4 kg)   SpO2 96%   BMI 33.81 kg/m??     SpO2 Readings from Last 6 Encounters:   02/23/18 96%   10/23/16 97%   10/21/16 95%   10/20/16 100%   10/18/16 95%   10/17/16 93%        No intake or output data in the 24 hours ending 02/23/18 1126       Exam:     General   well developed, well nourished, appears stated age, in no acute distress  Respiratory   Coarse breath sounds  Abdominal  Soft, non-tender, non-distended, positive bowel sounds, no hepatosplenomegaly  Extremities  +1 pulses, right hip exquisitely tender over greater trochanter and pain increased with internal rotation.    Lab Data Reviewed: (see below)    Medications Reviewed: (see below)    ______________________________________________________________________    Medications:     Current Facility-Administered Medications   Medication Dose Route Frequency   ??? aspirin delayed-release tablet 81 mg  81 mg Oral DAILY   ??? bumetanide (BUMEX) tablet 2 mg  2 mg Oral DAILY   ??? carvedilol (COREG) tablet 25 mg  25 mg Oral BID WITH MEALS   ??? clopidogrel (PLAVIX) tablet 75 mg  75 mg Oral DAILY    ??? DULoxetine (CYMBALTA) capsule 60 mg  60 mg Oral DAILY   ??? ezetimibe (ZETIA) tablet 10 mg  10 mg Oral DAILY   ??? losartan (COZAAR) tablet 25 mg  25 mg Oral DAILY   ??? magnesium oxide (MAG-OX) tablet 400 mg  400 mg Oral DAILY   ??? pantoprazole (PROTONIX) tablet 40 mg  40 mg Oral ACB   ??? sodium chloride (NS) flush 5-40 mL  5-40 mL IntraVENous Q8H   ??? sodium chloride (NS) flush 5-40 mL  5-40 mL IntraVENous PRN   ??? naloxone (NARCAN) injection 0.4 mg  0.4 mg IntraVENous PRN   ??? enoxaparin (LOVENOX) injection 40 mg  40 mg SubCUTAneous Q24H   ??? gabapentin (NEURONTIN) capsule 300 mg  300 mg Oral TID   ??? insulin lispro (HUMALOG) injection   SubCUTAneous TIDAC   ???  glucose chewable tablet 16 g  4 Tab Oral PRN   ??? dextrose (D50W) injection syrg 12.5-25 g  12.5-25 g IntraVENous PRN   ??? glucagon (GLUCAGEN) injection 1 mg  1 mg IntraMUSCular PRN   ??? HYDROmorphone (PF) (DILAUDID) injection 0.5 mg  0.5 mg IntraVENous Q3H PRN   ??? oxyCODONE-acetaminophen (PERCOCET) 5-325 mg per tablet 1-2 Tab  1-2 Tab Oral Q4H PRN   ??? HYDROcodone-acetaminophen (NORCO) 5-325 mg per tablet 1 Tab  1 Tab Oral Q4H PRN            Lab Review:     Recent Labs     02/22/18  1128   WBC 8.6   HGB 12.9   HCT 39.0   PLT 249     Recent Labs     02/23/18  0226 02/22/18  1128   NA 133* 131*   K 5.0 4.6   CL 100 96*   CO2 27 28   GLU 156* 123*   BUN 24* 28*   CREA 0.91 0.97   CA 9.1 8.8   ALB 3.2* 3.4*   TBILI 0.2 0.2   SGOT 15 16   ALT 29 29     Lab Results   Component Value Date/Time    Glucose (POC) 140 (H) 02/23/2018 06:58 AM    Glucose (POC) 108 (H) 02/22/2018 05:09 PM    Glucose (POC) 156 (H) 10/21/2016 11:54 AM    Glucose (POC) 154 (H) 10/21/2016 08:10 AM    Glucose (POC) 176 (H) 10/20/2016 10:31 PM    Glucose POC 127 (A) 11/06/2016 03:30 PM     No results for input(s): PH, PCO2, PO2, HCO3, FIO2 in the last 72 hours.  No results for input(s): INR in the last 72 hours.    No lab exists for component: INREXT    Other pertinent lab: NA         Assessment:      Patient Active Problem List   Diagnosis Code   ??? Coronary atherosclerosis of native coronary artery I25.10   ??? Other chest pain R07.89   ??? Other dyspnea and respiratory abnormality R06.09, R09.89   ??? Mixed hyperlipidemia E78.2   ??? Type II diabetes mellitus, uncontrolled (HCC) E11.65   ??? Dyspnea R06.00   ??? Goiter E04.9   ??? Neuropathy G62.9   ??? Long term (current) use of insulin (HCC) Z79.4   ??? Hypertension complicating diabetes (HCC) E11.59, I10   ??? CAD (coronary artery disease) I25.10   ??? 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 Hypertensive urgency I16.0   ??? Anemia D64.9   ??? 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 Type 2 diabetes mellitus with diabetic neuropathy (HCC) E11.40   ??? Atherosclerosis of aorta (HCC) I70.0   ??? Severe obesity with body mass index (BMI) of 35.0 to 39.9 with serious comorbidity (HCC) E66.01   ??? 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 Hip pain M25.559          Plan:                 1. Hip pain- need mri to diffentiate   2. ? Iv steroids- I have no objection.  I do not have tigertext so please call the office.  3. Diabetes- ssi  4. Hypertension= continue meds  5. PT evaluation                ___________________________________________________    Attending Physician: Lake BellsNancy J Wilkes Potvin,  MD

## 2018-02-23 NOTE — Progress Notes (Signed)
Bedside shift change report given to Zach, RN (oncoming nurse) by Tina, RN (offgoing nurse). Report included the following information SBAR, Kardex and MAR.

## 2018-02-23 NOTE — Progress Notes (Signed)
Problem: Falls - Risk of  Goal: *Absence of Falls  Document Schmid Fall Risk and appropriate interventions in the flowsheet.  Outcome: Not Progressing Towards Goal  Fall Risk Interventions:  Mobility Interventions: Bed/chair exit alarm, Communicate number of staff needed for ambulation/transfer, Patient to call before getting OOB         Medication Interventions: Bed/chair exit alarm, Evaluate medications/consider consulting pharmacy, Patient to call before getting OOB    Elimination Interventions: Call light in reach, Bed/chair exit alarm, Patient to call for help with toileting needs, Toilet paper/wipes in reach    History of Falls Interventions: Evaluate medications/consider consulting pharmacy, Door open when patient unattended, Investigate reason for fall, Bed/chair exit alarm

## 2018-02-23 NOTE — Progress Notes (Signed)
Physical Therapy Screening:    An InBasket screening referral was triggered for physical therapy based on results obtained during the nursing admission assessment.  The patient???s chart was reviewed and the patient is appropriate for a skilled therapy evaluation if there is a decline in functional mobility from baseline.  Please order a consult for physical therapy if you are in agreement and would like an evaluation to be completed.  Thank you.    Tracey Hermance S Edder Bellanca, PT

## 2018-02-23 NOTE — Other (Signed)
Bedside shift change report given to Tina (oncoming nurse) by Kimrey (offgoing nurse). Report included the following information SBAR.

## 2018-02-23 NOTE — Progress Notes (Addendum)
Post Fall Documentation      Ladell HeadsDiana H Herd unwitnessed fall occurred on 02/23/18 (Date) at 1128 (Time).     The answers to the following questions summarize the fall:     ?? In the patient's own words,:  ?? What was he/she doing when he/she fell? Patient stated "I thought I would be able to do it on my own" referring to getting up to bedside commode without calling for assistance.    ?? What are his/her complaints? Patient stated she needed to go to the bathroom.    ?? Nurse:  ?? Document observation, treatment, conversation, follow-up, and patient response.     ?? What was the patient's condition when found (i.e., pain, symptoms, cuts, bruises)?     ?? What specific complaints did the patient have?      ?? What did the staff do when patient was found (i.e., vital signs, returned to bed with fall alarm, side rails up)?     ?? Which physician was notified?     ?? Family has been notified.     Rounded on patient at approximately 1115.  Asked the patient if she needed anything at the time.  Patient stated she did not.  I ensured the patient had her call bell within reach prior to leaving room and reminded patient to use it to call if she needed anything.  I was at nursing station when I heard someone yell out [phone] needed help.  I immediately went to room along with others to find patient laying on the ground.  Per PT they heard a loud noise and rushed in.  Per patient, she got up to bedside commode without calling for assistance and when she went to get off it, the bedside commode flipped.  Mobility team was called and assisted in getting patient off floor and back into bed.  Patient gown was changed, side rails were re-raised, and patient was placed on a bed alarm.  Patient was re-taught the importance of using the call bell or phone to call for help with toileting and any other needs.  Patient verbalized understanding. Patient stated that she did not hit her head.  Patient stated she did not  new onset of pain in other locations.  Per patient pain only present in hip and that it felt the same as prior to fall. Patient vital signs were obtained following fall.  Dr. Cherly HensenPahle was notified.  Patient significant other was notified.  Will continue to monitor patient    Tiney Rougeina M Graf

## 2018-02-23 NOTE — Progress Notes (Addendum)
Ortho Progress Note    Reviewed MRI right hip completed earlier today   MRI shows mild vastus lateralis strain and mild right hip osteoarthrosis  Would not anticipate those findings would explain the severity of her pain and inability to ambulate  No indication for surgical intervention right hip  Symptoms most likely related to spine  Depo-Medrol 40mg  q 6 hours started today  Defer to spine surgery for further recommendations    Idelle LeechKurt Kaedyn Belardo PA-C  Ortho Trauma Team  [facility]  02/23/18  10:47pm

## 2018-02-23 NOTE — Progress Notes (Signed)
NUTRITION COMPLETE ASSESSMENT    RECOMMENDATIONS:   1. Add bowel regimen PRN with hx of constipation   2. Document % meals consumed in I/O flowsheet to monitor intake  3. Weekly weights     Interventions/Plan:   Food/Nutrient Delivery:    Commercial supplement(Glucerna BID)          Assessment:   Reason for Assessment: '[x]' BPA/MST Referral     Diet: Consistent carb  Supplements: none  Nutritionally Significant Medications: '[x]'  Reviewed & Includes: bumex, coreg, plavix, cymbalta, zetia, SSI, mag-ox, protonix   Pre-Hospitalization:  Usual Appetite: Poor  Subjective: Kelsey [name] in Kelsey [name] at time of visit. Family request to revisit at another time.     Objective:  Kelsey [name] admitted for hip pain. PMHx: HTN, CAD, chronic pain, COPD, diabetes, GERD. Pain of right buttock and hip prior to admit for 3 weeks noted. UTI with abx rx.     Per office visit note (01/20/18) MD noting Kelsey [name] reports of unintentional wt loss of 30# x 2 years. Wt has been realtively stable over past 7+ months at 205-207#. [facility] source not documented this admit - please weight on standing scale). Decreased appetite with issues with constipation noted. Predict pain also negatively impacting appetite. Will add Glucerna BID (440kcal, 20g protein) until PO intake can be assessed. Recommend adding bowel regimen PRN - scheduled if needed.    Of note: triglycerides 212m/dl on 1/20.       Will follow for Kelsey [name] interview as able, PO intake, supplement consumption, and wt trends.   Estimated Nutrition Needs:   Kcals/day: 13491Kcals/day(1687-1827kcal)  Protein: 89 g(1g/kg)  Fluid: 1700 ml(133mkcal)  Based On: [place] Jeor(x 1.2-1.3)  Weight Used: Actual wt(89.4kg)    Kelsey [name] expected to meet estimated nutrient needs:  '[]'    Yes     '[]'   No '[x]'  Unable to predict at this time  Nutrition Diagnosis:   1. Unintended weight loss related to poor appetite, ?2/2 pain as evidenced by chart review; 30# wt loss over past 2 years    Goals:      Consumption of at least 50% meals and 1-2 supplements/day in 5-7 days     Monitoring & Evaluation:    - Total energy intake, Liquid meal replacement   - Weight/weight change, GI     Previous Nutrition Goals Met:   N/A  Previous Recommendations:    N/A    Education & Discharge Needs:   '[x]'  None Identified   '[]'  Identified and addressed    '[]'  Participated in care plan, discharge planning, and/or interdisciplinary rounds        Cultural, religious and ethnic food preferences identified: None    Skin Integrity: '[x]' Intact  '[]' Other  Edema: '[x]' None '[]' Other  Last BM: PTA  Food Allergies: '[x]' None '[]' Other  Diet Restrictions: Cultural/Religious Preference(s): None     Anthropometrics:    Weight Loss Metrics 02/22/2018 02/15/2018 02/05/2018 02/02/2018 01/20/2018 11/18/2017 10/15/2017   Today's Wt 197 lb 205 lb 205 lb 205 lb 205 lb 207 lb 3.2 oz 207 lb   BMI 33.81 kg/m2 35.19 kg/m2 35.19 kg/m2 35.19 kg/m2 35.19 kg/m2 35.57 kg/m2 35.53 kg/m2         Height: '5\' 4"'  (162.6 cm),    Body mass index is 33.81 kg/m??.  IBW : 54.4 kg (120 lb), % IBW (Calculated): 164.17 %  Usual Body Weight: 93 kg (205 lb),      Labs:    Lab Results   Component Value Date/Time    Sodium 133 (  L) 02/23/2018 02:26 AM    Potassium 5.0 02/23/2018 02:26 AM    Chloride 100 02/23/2018 02:26 AM    CO2 27 02/23/2018 02:26 AM    Glucose 156 (H) 02/23/2018 02:26 AM    BUN 24 (H) 02/23/2018 02:26 AM    Creatinine 0.91 02/23/2018 02:26 AM    Calcium 9.1 02/23/2018 02:26 AM    Magnesium 1.4 (L) 09/23/2017 12:13 PM    Albumin 3.2 (L) 02/23/2018 02:26 AM     Lab Results   Component Value Date/Time    Hemoglobin A1c (POC) 6.3 (A) 01/20/2018 12:43 PM     Dorette Grate, RD Pager [phone] or [phone]

## 2018-02-24 ENCOUNTER — Observation Stay: Admit: 2018-02-24 | Payer: MEDICARE | Primary: Internal Medicine

## 2018-02-24 LAB — GLUCOSE, POC
Glucose (POC): 158 mg/dL — ABNORMAL HIGH (ref 65–100)
Glucose (POC): 205 mg/dL — ABNORMAL HIGH (ref 65–100)
Glucose (POC): 282 mg/dL — ABNORMAL HIGH (ref 65–100)

## 2018-02-24 MED ORDER — IOHEXOL 300 MG/ML IV SOLN
300 mg iodine/mL | Freq: Once | INTRAVENOUS | Status: AC
Start: 2018-02-24 — End: 2018-02-24
  Administered 2018-02-24: 21:00:00 via INTRA_ARTICULAR

## 2018-02-24 MED ORDER — TRIAMCINOLONE ACETONIDE 40 MG/ML SUSP FOR INJECTION
40 mg/mL | INTRAMUSCULAR | Status: AC
Start: 2018-02-24 — End: 2018-02-25
  Administered 2018-02-24: 21:00:00

## 2018-02-24 MED ORDER — TRIAMCINOLONE ACETONIDE 40 MG/ML SUSP FOR INJECTION
40 mg/mL | Freq: Once | INTRAMUSCULAR | Status: AC
Start: 2018-02-24 — End: 2018-02-24
  Administered 2018-02-24: 21:00:00 via INTRABURSAL

## 2018-02-24 MED ORDER — BUPIVACAINE (PF) 0.5 % (5 MG/ML) IJ SOLN
0.5 % (5 mg/mL) | Freq: Once | INTRAMUSCULAR | Status: AC
Start: 2018-02-24 — End: 2018-02-25
  Administered 2018-02-24: 21:00:00 via INTRA_ARTICULAR

## 2018-02-24 MED ORDER — HYDROCODONE-ACETAMINOPHEN 5 MG-325 MG TAB
5-325 mg | ORAL | Status: DC | PRN
Start: 2018-02-24 — End: 2018-02-25
  Administered 2018-02-24 – 2018-02-25 (×4): via ORAL

## 2018-02-24 MED ORDER — LIDOCAINE (PF) 20 MG/ML (2 %) IJ SOLN
20 mg/mL (2 %) | INTRAMUSCULAR | Status: AC
Start: 2018-02-24 — End: 2018-02-24
  Administered 2018-02-24: 21:00:00

## 2018-02-24 MED ORDER — SODIUM BICARBONATE 4.2 % INJ
4.2 % | Freq: Once | INTRAVENOUS | Status: AC
Start: 2018-02-24 — End: 2018-02-24
  Administered 2018-02-24: 21:00:00 via SUBCUTANEOUS

## 2018-02-24 MED FILL — BUMETANIDE 1 MG TAB: 1 mg | ORAL | Qty: 2

## 2018-02-24 MED FILL — GABAPENTIN 300 MG CAP: 300 mg | ORAL | Qty: 1

## 2018-02-24 MED FILL — HYDROCODONE-ACETAMINOPHEN 5 MG-325 MG TAB: 5-325 mg | ORAL | Qty: 1

## 2018-02-24 MED FILL — MAGNESIUM OXIDE 400 MG TAB: 400 mg | ORAL | Qty: 1

## 2018-02-24 MED FILL — EZETIMIBE 10 MG TAB: 10 mg | ORAL | Qty: 1

## 2018-02-24 MED FILL — LANTUS U-100 INSULIN 100 UNIT/ML SUBCUTANEOUS SOLUTION: 100 unit/mL | SUBCUTANEOUS | Qty: 0.1

## 2018-02-24 MED FILL — NORMAL SALINE FLUSH 0.9 % INJECTION SYRINGE: INTRAMUSCULAR | Qty: 10

## 2018-02-24 MED FILL — SOLU-MEDROL (PF) 40 MG/ML SOLUTION FOR INJECTION: 40 mg/mL | INTRAMUSCULAR | Qty: 1

## 2018-02-24 MED FILL — TRIAMCINOLONE ACETONIDE 40 MG/ML SUSP FOR INJECTION: 40 mg/mL | INTRAMUSCULAR | Qty: 1

## 2018-02-24 MED FILL — SENSORCAINE-MPF 0.5 % (5 MG/ML) INJECTION SOLUTION: 0.5 % (5 mg/mL) | INTRAMUSCULAR | Qty: 30

## 2018-02-24 MED FILL — CARVEDILOL 12.5 MG TAB: 12.5 mg | ORAL | Qty: 2

## 2018-02-24 MED FILL — INSULIN LISPRO 100 UNIT/ML INJECTION: 100 unit/mL | SUBCUTANEOUS | Qty: 1

## 2018-02-24 MED FILL — DULOXETINE 60 MG CAP, DELAYED RELEASE: 60 mg | ORAL | Qty: 1

## 2018-02-24 MED FILL — LOSARTAN 25 MG TAB: 25 mg | ORAL | Qty: 1

## 2018-02-24 MED FILL — SODIUM BICARBONATE 4.2 % INJ: 4.2 % | INTRAVENOUS | Qty: 1

## 2018-02-24 MED FILL — XYLOCAINE-MPF 20 MG/ML (2 %) INJECTION SOLUTION: 20 mg/mL (2 %) | INTRAMUSCULAR | Qty: 10

## 2018-02-24 MED FILL — ENOXAPARIN 40 MG/0.4 ML SUB-Q SYRINGE: 40 mg/0.4 mL | SUBCUTANEOUS | Qty: 0.4

## 2018-02-24 MED FILL — CLOPIDOGREL 75 MG TAB: 75 mg | ORAL | Qty: 1

## 2018-02-24 MED FILL — OMNIPAQUE 300 MG IODINE/ML INTRAVENOUS SOLUTION: 300 mg iodine/mL | INTRAVENOUS | Qty: 30

## 2018-02-24 MED FILL — OXYCODONE-ACETAMINOPHEN 5 MG-325 MG TAB: 5-325 mg | ORAL | Qty: 1

## 2018-02-24 MED FILL — ASPIRIN 81 MG TAB, DELAYED RELEASE: 81 mg | ORAL | Qty: 1

## 2018-02-24 MED FILL — PANTOPRAZOLE 40 MG TAB, DELAYED RELEASE: 40 mg | ORAL | Qty: 1

## 2018-02-24 NOTE — Consults (Signed)
Note dictated.    Suspect R ischial bursitis/prox hamstring tendonopathy. Will ask IR to inject today.    Sharlyn BolognaJ Germany Chelf, MD

## 2018-02-24 NOTE — Consults (Signed)
ORTHOPAEDIC SPINE CONSULT NOTE    Subjective:     Date of Consultation:  February 24, 2018    Kelsey [name] is a 67 y.o. female who is being seen for right hip pain with radiation to the groin, down the leg to the foot. Nothing to the left. She was admitted for intractable pain. Pain began approximately 3 weeks ago and has progressively worsened. She denies injury/trauma. She is experiencing bladder incontinence upon standing. She does not lose complete control of the bladder, but while seated, she does not sense she needs to urinate. 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 Patient is ambulatory with a walker.    Patient is ambulating from the bathroom to the bedside upon my entrance using a walker. Her partner and service dog are at the bedside. Her pain is currently managed. She appears well. She reports that she was scheduled for C-spine surgery about a year ago, but cardiology would not clear her due to a right carotid blockage. She is aware she has DDD in the lumbar spine.    Patient Active Problem List    Diagnosis Date Noted   ??? Hip pain 02/22/2018   ??? Aortic mural thrombus (HCC) 02/15/2018   ??? Severe obesity with body mass index (BMI) of 35.0 to 39.9 with serious comorbidity (HCC) 06/30/2017   ??? 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 Type 2 diabetes mellitus with diabetic neuropathy (HCC) 01/08/2017   ??? Atherosclerosis of aorta (HCC) 01/08/2017   ??? Anemia 10/20/2016   ??? CAD (coronary artery disease) 10/16/2016   ??? 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 Hypertensive urgency 10/16/2016   ??? Long term (current) use of insulin (HCC) 03/04/2016   ??? Hypertension complicating diabetes (HCC) 03/04/2016   ??? Neuropathy 04/06/2013   ??? Goiter 04/20/2012   ??? Dyspnea 03/13/2011   ??? Coronary atherosclerosis of native coronary artery 12/24/2009   ??? Other chest pain 12/24/2009   ??? Other dyspnea and respiratory  abnormality 12/24/2009   ??? Mixed hyperlipidemia 12/24/2009   ??? Type II diabetes mellitus, uncontrolled (HCC) 12/24/2009     Family History   Problem Relation Age of Onset   ??? Lung Disease Mother 5884   ??? Stroke Father    ??? Lung Disease Brother 3176        COPD   ??? Hypertension Brother    ??? Cancer Brother         PROSTATE   ??? Heart Disease Brother    ??? Diabetes Brother    ??? No Known Problems Paternal Grandmother    ??? Anesth Problems Neg Hx       Social History     Tobacco Use   ??? Smoking status: Former Smoker     Packs/day: 0.30     Years: 45.00     Pack years: 13.50     Types: Cigarettes   ??? Smokeless tobacco: Never Used   ??? Tobacco comment: quit around the first of November   Substance Use Topics   ??? Alcohol use: Yes     Frequency: Monthly or less     Drinks per session: 1 or 2     Binge frequency: Never     Comment: may be 3 times a year if that     Past Medical History:   Diagnosis Date   ??? Acute MI (HCC)     x3   ???  Arthritis    ??? CAD (coronary artery disease)    ??? Cervical spondylolysis    ??? Chronic pain    ??? COPD    ??? Diabetes (HCC)    ??? GERD (gastroesophageal reflux disease)    ??? Goiter 04/20/2012   ??? Hypertension    ??? Neuropathy 04/06/2013      Past Surgical History:   Procedure Laterality Date   ??? 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 CARDIAC SURG PROCEDURE UNLIST      PTCA   ??? HX KNEE ARTHROSCOPY Right     X2   ??? HX OTHER SURGICAL  1970    GANGLION CYST   ??? HX TONSILLECTOMY  1963      Prior to Admission medications    Medication Sig Start Date End Date Taking? Authorizing Provider   carvedilol (COREG) 25 mg tablet Take 25 mg by mouth two (2) times daily (with meals).   Yes Provider, Historical   HYDROcodone-acetaminophen (NORCO) 7.5-325 mg per tablet Take 2 Tabs by mouth every six (6) hours as needed for Pain.   Yes Provider, Historical   metFORMIN ER (GLUCOPHAGE XR) 500 mg tablet Take 1,000 mg by mouth two (2) times daily (after meals).   Yes Provider, Historical   cephALEXin (KEFLEX) 500 mg capsule Take  500 mg by mouth four (4) times daily. 02/18/18 02/25/18 Yes Provider, Historical   diazePAM (VALIUM) 10 mg tablet Take 10 mg by mouth every eight (8) hours as needed for Anxiety.   Yes Provider, Historical   pantoprazole (PROTONIX) 40 mg tablet TAKE 1 TABLET BY MOUTH DAILY 01/26/18  Yes Pahle, Davonna Belling, MD   ferrous sulfate 325 mg (65 mg iron) tablet TAKE 1 TABLET BY MOUTH DAILY 12/23/17  Yes Marcellus Scott, MD   losartan (COZAAR) 25 mg tablet TAKE 1 TAB BY MOUTH DAILY. 12/06/17  Yes Pahle, Davonna Belling, MD   CHANTIX CONTINUING MONTH BOX 1 mg tablet TAKE 1 TABLET BY MOUTH TWICE A DAY 11/23/17  Yes Lake Bells, MD   clopidogrel (PLAVIX) 75 mg tab TAKE 1 TABLET BY MOUTH DAILY 11/02/17  Yes Bari Edward, MD   magnesium oxide (MAG-OX) 400 mg tablet Take 1 Tab by mouth daily. 09/29/17  Yes Marcellus Scott, MD   DULoxetine (CYMBALTA) 60 mg capsule TAKE 1 CAPSULE BY MOUTH DAILY 09/24/17  Yes Pahle, Davonna Belling, MD   VESICARE 10 mg tablet TAKE 1 TABLET BY MOUTH DAILY. 08/28/17  Yes Lake Bells, MD   VITAMIN D3 2,000 unit cap capsule TAKE ONE CAPSULE BY MOUTH DAILY 06/23/17  Yes Diefenderfer, Mitzi Davenport, NP   aspirin delayed-release 81 mg tablet TAKE 1 TAB BY MOUTH DAILY. 06/23/17  Yes Diefenderfer, Mitzi Davenport, NP   ezetimibe (ZETIA) 10 mg tablet TAKE 1 TAB BY MOUTH NIGHTLY. 04/23/17  Yes Pahle, Davonna Belling, MD   LEVEMIR FLEXTOUCH U-100 INSULN 100 unit/mL (3 mL) inpn INJECT 34 UNITS SUBCUTANEOUSLY AT BEDTIME DX: 250.00 03/21/17  Yes Pahle, Davonna Belling, MD   gabapentin (NEURONTIN) 300 mg capsule Take 1,200 mg by mouth daily.   Yes Provider, Historical   bumetanide (BUMEX) 2 mg tablet Take 2 mg by mouth daily.   Yes Provider, Historical   nabumetone (RELAFEN) 500 mg tablet Take 500 mg by mouth two (2) times daily as needed for Pain.    Provider, Historical   insulin lispro (HUMALOG U-100 INSULIN) 100 unit/mL injection by SubCUTAneous route. 0-8 units three times  a day as needed    Provider, Historical   acetaminophen (ACETAMINOPHEN EXTRA  STRENGTH) 500 mg tablet Take 1,000 mg by mouth every six (6) hours. With tramadol    Provider, Historical   nitroglycerin (NITROSTAT) 0.4 mg SL tablet 1 Tab by SubLINGual route every five (5) minutes as needed for Chest Pain for up to 3 doses. 09/04/16   Diefenderfer, Mitzi Davenport, NP     Current Facility-Administered Medications   Medication Dose Route Frequency   ??? HYDROcodone-acetaminophen (NORCO) 5-325 mg per tablet 1 Tab  1 Tab Oral Q4H PRN   ??? insulin glargine (LANTUS) injection 10 Units  10 Units SubCUTAneous QHS   ??? aspirin delayed-release tablet 81 mg  81 mg Oral DAILY   ??? bumetanide (BUMEX) tablet 2 mg  2 mg Oral DAILY   ??? carvedilol (COREG) tablet 25 mg  25 mg Oral BID WITH MEALS   ??? clopidogrel (PLAVIX) tablet 75 mg  75 mg Oral DAILY   ??? DULoxetine (CYMBALTA) capsule 60 mg  60 mg Oral DAILY   ??? ezetimibe (ZETIA) tablet 10 mg  10 mg Oral DAILY   ??? losartan (COZAAR) tablet 25 mg  25 mg Oral DAILY   ??? magnesium oxide (MAG-OX) tablet 400 mg  400 mg Oral DAILY   ??? pantoprazole (PROTONIX) tablet 40 mg  40 mg Oral ACB   ??? sodium chloride (NS) flush 5-40 mL  5-40 mL IntraVENous Q8H   ??? sodium chloride (NS) flush 5-40 mL  5-40 mL IntraVENous PRN   ??? naloxone (NARCAN) injection 0.4 mg  0.4 mg IntraVENous PRN   ??? enoxaparin (LOVENOX) injection 40 mg  40 mg SubCUTAneous Q24H   ??? gabapentin (NEURONTIN) capsule 300 mg  300 mg Oral TID   ??? insulin lispro (HUMALOG) injection   SubCUTAneous TIDAC   ??? glucose chewable tablet 16 g  4 Tab Oral PRN   ??? dextrose (D50W) injection syrg 12.5-25 g  12.5-25 g IntraVENous PRN   ??? glucagon (GLUCAGEN) injection 1 mg  1 mg IntraMUSCular PRN      Allergies   Allergen Reactions   ??? Morphine Anaphylaxis     Tolerates oxycodone without problem   ??? Avalide [Irbesartan-Hydrochlorothiazide] Myalgia   ??? Betadine [Povidone-Iodine] Rash   ??? Demerol [Meperidine] Other (comments)     Makes her feel crazy   ??? Pcn [Penicillins] Swelling   ??? Prinivil [Lisinopril] Cough   ??? Statins-Hmg-Coa Reductase  Inhibitors Myalgia   ??? Sulfa (Sulfonamide Antibiotics) Unknown (comments)   ??? Tricor [Fenofibrate Micronized] Myalgia        Objective:     Patient Vitals for the past 8 hrs:   BP Temp Pulse Resp SpO2   02/24/18 0911 (!) 152/94 97.6 ??F (36.4 ??C) 71 16 ???   02/24/18 0846 185/83 ??? ??? ??? ???   02/24/18 0500 162/79 97.4 ??F (36.3 ??C) 78 16 97 %     Temp (24hrs), Avg:97.7 ??F (36.5 ??C), Min:97.4 ??F (36.3 ??C), Max:98.1 ??F (36.7 ??C)      EXAM: Ambulatory. Actively moving bilateral upper and lower extremities. 5/5 strength bilateral lower extremities.     Imaging Review: Lumbar MRI  Impression:  Multilevel degenerative changes detailed by level above. This has not  significantly changed except for the L4-L5 disc level where the synovial cyst  off the right facet is no longer visualized and canal stenosis at that level has  decreased    Labs:   Recent Results (from the past 24 hour(s))  GLUCOSE, POC    Collection Time: 02/23/18  4:22 PM   Result Value Ref Range    Glucose (POC) 152 (H) 65 - 100 mg/dL    Performed by ZELL KRISTEN    GLUCOSE, POC    Collection Time: 02/23/18  9:10 PM   Result Value Ref Range    Glucose (POC) 158 (H) 65 - 100 mg/dL    Performed by Lorel Monaco    GLUCOSE, POC    Collection Time: 02/24/18  6:53 AM   Result Value Ref Range    Glucose (POC) 205 (H) 65 - 100 mg/dL    Performed by Hale Drone        Impression:     Active Problems:    Hip pain (02/22/2018)      Plan:   1. Pain control  2. Physical therapy as tolerated  3. Right Ischial Bursa injection scheduled for today. Patient was advised that this will help [place] determine whether the pain is from the hip or originating from the back. She should give the injection a couple of weeks to determine its full effect. If the pain continues, she needs to follow up with [place] on an outpatient basis and we can schedule her for lumbar ESIs accordingly. Patient and partner express understanding and agreement with the plan.     Lorre Nick, PA-C

## 2018-02-24 NOTE — Progress Notes (Signed)
Medical Progress Note      NAME: Kelsey [name]   DOB:  02-10-1951  MRM:  [id number]    Date/Time: 02/24/2018  9:12 PM    Problem List:   Active Problems:    Hip pain (02/22/2018)           Subjective:     Patient still tender over right hip.  Has pain with right hip internal rotation    Past Medical History:   Diagnosis Date   ??? Acute MI (HCC)     x3   ??? Arthritis    ??? CAD (coronary artery disease)    ??? Cervical spondylolysis    ??? Chronic pain    ??? COPD    ??? Diabetes (HCC)    ??? GERD (gastroesophageal reflux disease)    ??? Goiter 04/20/2012   ??? Hypertension    ??? Neuropathy 04/06/2013       ROS:  General: postive for weakness  Respiratory:  negative for cough, sputum production, SOB, wheezing, DOE, pleuritic pain  Cardiology:  negative for chest pain, palpitations, orthopnea, PND, edema, syncope   Muskuloskeletal : positive for right hip pain         Objective:       Vitals:          Last 24hrs VS reviewed since prior progress note. Most recent are:    Visit Vitals  BP 143/75 (BP 1 Location: Right arm, BP Patient Position: Sitting)   Pulse 78   Temp 98 ??F (36.7 ??C)   Resp 18   Ht 5\' 4"  (1.626 m)   Wt 197 lb (89.4 kg)   SpO2 96%   BMI 33.81 kg/m??     SpO2 Readings from Last 6 Encounters:   02/24/18 96%   10/23/16 97%   10/21/16 95%   10/20/16 100%   10/18/16 95%   10/17/16 93%            Intake/Output Summary (Last 24 hours) at 02/24/2018 2112  Last data filed at 02/24/2018 1720  Gross per 24 hour   Intake 900 ml   Output ???   Net 900 ml          Exam:     General   well developed, well nourished, appears stated age, in no acute distress  Respiratory   Coarse breath sounds  Cardiology  regular  Abdominal  Soft, non-tender, non-distended, positive bowel sounds, no hepatosplenomegaly  Extremities  +1 pulses    Lab Data Reviewed: (see below)    Medications Reviewed: (see below)    ______________________________________________________________________    Medications:     Current Facility-Administered Medications    Medication Dose Route Frequency   ??? HYDROcodone-acetaminophen (NORCO) 5-325 mg per tablet 1 Tab  1 Tab Oral Q4H PRN   ??? bupivacaine (PF) (MARCAINE) 0.5 % (5 mg/mL) injection 25 mg  5 mL Intra artICUlar RAD ONCE   ??? triamcinolone acetonide (KENALOG-40) 40 mg/mL injection       ??? insulin glargine (LANTUS) injection 10 Units  10 Units SubCUTAneous QHS   ??? aspirin delayed-release tablet 81 mg  81 mg Oral DAILY   ??? bumetanide (BUMEX) tablet 2 mg  2 mg Oral DAILY   ??? carvedilol (COREG) tablet 25 mg  25 mg Oral BID WITH MEALS   ??? clopidogrel (PLAVIX) tablet 75 mg  75 mg Oral DAILY   ??? DULoxetine (CYMBALTA) capsule 60 mg  60 mg Oral DAILY   ??? ezetimibe (ZETIA) tablet 10 mg  10 mg Oral DAILY   ??? losartan (COZAAR) tablet 25 mg  25 mg Oral DAILY   ??? magnesium oxide (MAG-OX) tablet 400 mg  400 mg Oral DAILY   ??? pantoprazole (PROTONIX) tablet 40 mg  40 mg Oral ACB   ??? sodium chloride (NS) flush 5-40 mL  5-40 mL IntraVENous Q8H   ??? sodium chloride (NS) flush 5-40 mL  5-40 mL IntraVENous PRN   ??? naloxone (NARCAN) injection 0.4 mg  0.4 mg IntraVENous PRN   ??? enoxaparin (LOVENOX) injection 40 mg  40 mg SubCUTAneous Q24H   ??? gabapentin (NEURONTIN) capsule 300 mg  300 mg Oral TID   ??? insulin lispro (HUMALOG) injection   SubCUTAneous TIDAC   ??? glucose chewable tablet 16 g  4 Tab Oral PRN   ??? dextrose (D50W) injection syrg 12.5-25 g  12.5-25 g IntraVENous PRN   ??? glucagon (GLUCAGEN) injection 1 mg  1 mg IntraMUSCular PRN            Lab Review:     Recent Labs     02/22/18  1128   WBC 8.6   HGB 12.9   HCT 39.0   PLT 249     Recent Labs     02/23/18  0226 02/22/18  1128   NA 133* 131*   K 5.0 4.6   CL 100 96*   CO2 27 28   GLU 156* 123*   BUN 24* 28*   CREA 0.91 0.97   CA 9.1 8.8   ALB 3.2* 3.4*   TBILI 0.2 0.2   SGOT 15 16   ALT 29 29     Lab Results   Component Value Date/Time    Glucose (POC) 282 (H) 02/24/2018 05:41 PM    Glucose (POC) 205 (H) 02/24/2018 06:53 AM    Glucose (POC) 158 (H) 02/23/2018 09:10 PM     Glucose (POC) 152 (H) 02/23/2018 04:22 PM    Glucose (POC) 140 (H) 02/23/2018 06:58 AM     No results for input(s): PH, PCO2, PO2, HCO3, FIO2 in the last 72 hours.  No results for input(s): INR in the last 72 hours.    No lab exists for component: INREXT    Other pertinent lab: NA         Assessment:     Patient Active Problem List   Diagnosis Code   ??? Coronary atherosclerosis of native coronary artery I25.10   ??? Other chest pain R07.89   ??? Other dyspnea and respiratory abnormality R06.09, R09.89   ??? Mixed hyperlipidemia E78.2   ??? Type II diabetes mellitus, uncontrolled (HCC) E11.65   ??? Dyspnea R06.00   ??? Goiter E04.9   ??? Neuropathy G62.9   ??? Long term (current) use of insulin (HCC) Z79.4   ??? Hypertension complicating diabetes (HCC) E11.59, I10   ??? CAD (coronary artery disease) I25.10   ??? 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 Hypertensive urgency I16.0   ??? Anemia D64.9   ??? 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 Type 2 diabetes mellitus with diabetic neuropathy (HCC) E11.40   ??? Atherosclerosis of aorta (HCC) I70.0   ??? Severe obesity with body mass index (BMI) of 35.0 to 39.9 with serious comorbidity (HCC) E66.01   ??? 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 Hip pain M25.559          Plan:  1. Right hip severe pain- will ask Dr. Rennie Plowman to see  2. Diabetes- ssi, lantus  3. Rehab consult                ___________________________________________________    Attending Physician: Lake Bells, MD

## 2018-02-24 NOTE — Consults (Signed)
ORTHOPAEDIC SPINE CONSULT NOTE    Subjective:     Date of Consultation:  February 24, 2018    Kelsey [name] is a 67 y.o. female who is being seen for right hip pain with radiation to the groin, down the leg to the foot. Nothing to the left. She was admitted for intractable pain. Pain began approximately 3 weeks ago and has progressively worsened. She denies injury/trauma. She is experiencing bladder incontinence upon standing. She does not lose complete control of the bladder, but while seated, she does not sense she needs to urinate. 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 Patient is ambulatory with a walker.    Patient is ambulating from the bathroom to the bedside upon my entrance using a walker. Her partner and service dog are at the bedside. Her pain is currently managed. She appears well. She reports that she was scheduled for C-spine surgery about a year ago, but cardiology would not clear her due to a right carotid blockage. She is aware she has DDD in the lumbar spine.    Patient Active Problem List    Diagnosis Date Noted   ??? Hip pain 02/22/2018   ??? Aortic mural thrombus (HCC) 02/15/2018   ??? Severe obesity with body mass index (BMI) of 35.0 to 39.9 with serious comorbidity (HCC) 06/30/2017   ??? 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 Type 2 diabetes mellitus with diabetic neuropathy (HCC) 01/08/2017   ??? Atherosclerosis of aorta (HCC) 01/08/2017   ??? Anemia 10/20/2016   ??? CAD (coronary artery disease) 10/16/2016   ??? 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 Hypertensive urgency 10/16/2016   ??? Long term (current) use of insulin (HCC) 03/04/2016   ??? Hypertension complicating diabetes (HCC) 03/04/2016   ??? Neuropathy 04/06/2013   ??? Goiter 04/20/2012   ??? Dyspnea 03/13/2011   ??? Coronary atherosclerosis of native coronary artery 12/24/2009   ??? Other chest pain 12/24/2009    ??? Other dyspnea and respiratory abnormality 12/24/2009   ??? Mixed hyperlipidemia 12/24/2009   ??? Type II diabetes mellitus, uncontrolled (HCC) 12/24/2009     Family History   Problem Relation Age of Onset   ??? Lung Disease Mother 65   ??? Stroke Father    ??? Lung Disease Brother 55        COPD   ??? Hypertension Brother    ??? Cancer Brother         PROSTATE   ??? Heart Disease Brother    ??? Diabetes Brother    ??? No Known Problems Paternal Grandmother    ??? Anesth Problems Neg Hx       Social History     Tobacco Use   ??? Smoking status: Former Smoker     Packs/day: 0.30     Years: 45.00     Pack years: 13.50     Types: Cigarettes   ??? Smokeless tobacco: Never Used   ??? Tobacco comment: quit around the first of November   Substance Use Topics   ??? Alcohol use: Yes     Frequency: Monthly or less     Drinks per session: 1 or 2     Binge frequency: Never     Comment: may be 3 times a year if that     Past Medical History:   Diagnosis Date   ??? Acute MI (HCC)     x3   ???  Arthritis    ??? CAD (coronary artery disease)    ??? Cervical spondylolysis    ??? Chronic pain    ??? COPD    ??? Diabetes (HCC)    ??? GERD (gastroesophageal reflux disease)    ??? Goiter 04/20/2012   ??? Hypertension    ??? Neuropathy 04/06/2013      Past Surgical History:   Procedure Laterality Date   ??? 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 CARDIAC SURG PROCEDURE UNLIST      PTCA   ??? HX KNEE ARTHROSCOPY Right     X2   ??? HX OTHER SURGICAL  1970    GANGLION CYST   ??? HX TONSILLECTOMY  1963      Prior to Admission medications    Medication Sig Start Date End Date Taking? Authorizing Provider   carvedilol (COREG) 25 mg tablet Take 25 mg by mouth two (2) times daily (with meals).   Yes Provider, Historical   HYDROcodone-acetaminophen (NORCO) 7.5-325 mg per tablet Take 2 Tabs by mouth every six (6) hours as needed for Pain.   Yes Provider, Historical   metFORMIN ER (GLUCOPHAGE XR) 500 mg tablet Take 1,000 mg by mouth two (2) times daily (after meals).   Yes Provider, Historical    cephALEXin (KEFLEX) 500 mg capsule Take 500 mg by mouth four (4) times daily. 02/18/18 02/25/18 Yes Provider, Historical   diazePAM (VALIUM) 10 mg tablet Take 10 mg by mouth every eight (8) hours as needed for Anxiety.   Yes Provider, Historical   pantoprazole (PROTONIX) 40 mg tablet TAKE 1 TABLET BY MOUTH DAILY 01/26/18  Yes Pahle, Davonna Belling, MD   ferrous sulfate 325 mg (65 mg iron) tablet TAKE 1 TABLET BY MOUTH DAILY 12/23/17  Yes Marcellus Scott, MD   losartan (COZAAR) 25 mg tablet TAKE 1 TAB BY MOUTH DAILY. 12/06/17  Yes Pahle, Davonna Belling, MD   CHANTIX CONTINUING MONTH BOX 1 mg tablet TAKE 1 TABLET BY MOUTH TWICE A DAY 11/23/17  Yes Lake Bells, MD   clopidogrel (PLAVIX) 75 mg tab TAKE 1 TABLET BY MOUTH DAILY 11/02/17  Yes Bari Edward, MD   magnesium oxide (MAG-OX) 400 mg tablet Take 1 Tab by mouth daily. 09/29/17  Yes Marcellus Scott, MD   DULoxetine (CYMBALTA) 60 mg capsule TAKE 1 CAPSULE BY MOUTH DAILY 09/24/17  Yes Pahle, Davonna Belling, MD   VESICARE 10 mg tablet TAKE 1 TABLET BY MOUTH DAILY. 08/28/17  Yes Lake Bells, MD   VITAMIN D3 2,000 unit cap capsule TAKE ONE CAPSULE BY MOUTH DAILY 06/23/17  Yes Diefenderfer, Mitzi Davenport, NP   aspirin delayed-release 81 mg tablet TAKE 1 TAB BY MOUTH DAILY. 06/23/17  Yes Diefenderfer, Mitzi Davenport, NP   ezetimibe (ZETIA) 10 mg tablet TAKE 1 TAB BY MOUTH NIGHTLY. 04/23/17  Yes Pahle, Davonna Belling, MD   LEVEMIR FLEXTOUCH U-100 INSULN 100 unit/mL (3 mL) inpn INJECT 34 UNITS SUBCUTANEOUSLY AT BEDTIME DX: 250.00 03/21/17  Yes Pahle, Davonna Belling, MD   gabapentin (NEURONTIN) 300 mg capsule Take 1,200 mg by mouth daily.   Yes Provider, Historical   bumetanide (BUMEX) 2 mg tablet Take 2 mg by mouth daily.   Yes Provider, Historical   nabumetone (RELAFEN) 500 mg tablet Take 500 mg by mouth two (2) times daily as needed for Pain.    Provider, Historical   insulin lispro (HUMALOG U-100 INSULIN) 100 unit/mL injection by SubCUTAneous route. 0-8 units three times  a day as needed    Provider,  Historical   acetaminophen (ACETAMINOPHEN EXTRA STRENGTH) 500 mg tablet Take 1,000 mg by mouth every six (6) hours. With tramadol    Provider, Historical   nitroglycerin (NITROSTAT) 0.4 mg SL tablet 1 Tab by SubLINGual route every five (5) minutes as needed for Chest Pain for up to 3 doses. 09/04/16   Diefenderfer, Mitzi Davenport, NP     Current Facility-Administered Medications   Medication Dose Route Frequency   ??? HYDROcodone-acetaminophen (NORCO) 5-325 mg per tablet 1 Tab  1 Tab Oral Q4H PRN   ??? insulin glargine (LANTUS) injection 10 Units  10 Units SubCUTAneous QHS   ??? aspirin delayed-release tablet 81 mg  81 mg Oral DAILY   ??? bumetanide (BUMEX) tablet 2 mg  2 mg Oral DAILY   ??? carvedilol (COREG) tablet 25 mg  25 mg Oral BID WITH MEALS   ??? clopidogrel (PLAVIX) tablet 75 mg  75 mg Oral DAILY   ??? DULoxetine (CYMBALTA) capsule 60 mg  60 mg Oral DAILY   ??? ezetimibe (ZETIA) tablet 10 mg  10 mg Oral DAILY   ??? losartan (COZAAR) tablet 25 mg  25 mg Oral DAILY   ??? magnesium oxide (MAG-OX) tablet 400 mg  400 mg Oral DAILY   ??? pantoprazole (PROTONIX) tablet 40 mg  40 mg Oral ACB   ??? sodium chloride (NS) flush 5-40 mL  5-40 mL IntraVENous Q8H   ??? sodium chloride (NS) flush 5-40 mL  5-40 mL IntraVENous PRN   ??? naloxone (NARCAN) injection 0.4 mg  0.4 mg IntraVENous PRN   ??? enoxaparin (LOVENOX) injection 40 mg  40 mg SubCUTAneous Q24H   ??? gabapentin (NEURONTIN) capsule 300 mg  300 mg Oral TID   ??? insulin lispro (HUMALOG) injection   SubCUTAneous TIDAC   ??? glucose chewable tablet 16 g  4 Tab Oral PRN   ??? dextrose (D50W) injection syrg 12.5-25 g  12.5-25 g IntraVENous PRN   ??? glucagon (GLUCAGEN) injection 1 mg  1 mg IntraMUSCular PRN      Allergies   Allergen Reactions   ??? Morphine Anaphylaxis     Tolerates oxycodone without problem   ??? Avalide [Irbesartan-Hydrochlorothiazide] Myalgia   ??? Betadine [Povidone-Iodine] Rash   ??? Demerol [Meperidine] Other (comments)     Makes her feel crazy   ??? Pcn [Penicillins] Swelling    ??? Prinivil [Lisinopril] Cough   ??? Statins-Hmg-Coa Reductase Inhibitors Myalgia   ??? Sulfa (Sulfonamide Antibiotics) Unknown (comments)   ??? Tricor [Fenofibrate Micronized] Myalgia        Objective:     Patient Vitals for the past 8 hrs:   BP Temp Pulse Resp SpO2   02/24/18 0911 (!) 152/94 97.6 ??F (36.4 ??C) 71 16 ???   02/24/18 0846 185/83 ??? ??? ??? ???   02/24/18 0500 162/79 97.4 ??F (36.3 ??C) 78 16 97 %     Temp (24hrs), Avg:97.7 ??F (36.5 ??C), Min:97.4 ??F (36.3 ??C), Max:98.1 ??F (36.7 ??C)      EXAM: Ambulatory. Actively moving bilateral upper and lower extremities. 5/5 strength bilateral lower extremities.     Imaging Review: Lumbar MRI  Impression:  Multilevel degenerative changes detailed by level above. This has not  significantly changed except for the L4-L5 disc level where the synovial cyst  off the right facet is no longer visualized and canal stenosis at that level has  decreased    Labs:   Recent Results (from the past 24 hour(s))  GLUCOSE, POC    Collection Time: 02/23/18  4:22 PM   Result Value Ref Range    Glucose (POC) 152 (H) 65 - 100 mg/dL    Performed by ZELL KRISTEN    GLUCOSE, POC    Collection Time: 02/23/18  9:10 PM   Result Value Ref Range    Glucose (POC) 158 (H) 65 - 100 mg/dL    Performed by Lorel MonacoKELLY PAULA    GLUCOSE, POC    Collection Time: 02/24/18  6:53 AM   Result Value Ref Range    Glucose (POC) 205 (H) 65 - 100 mg/dL    Performed by Hale DroneWEN WENDY        Impression:     Active Problems:    Hip pain (02/22/2018)      Plan:   1. Pain control  2. Physical therapy as tolerated  3. Right Ischial Bursa injection scheduled for today. Patient was advised that this will help us determine whether the pain is from the hip or originating from the back. She should give the injection a couple of weeks to determine its full effect. If the pain continues, she needs to follow up with us on an outpatient basis and we can schedule her for lumbar ESIs accordingly. Patient and partner express understanding and agreement with  the plan.     Lorre NickStephanie L Akiko Schexnider, PA-C

## 2018-02-24 NOTE — Progress Notes (Signed)
Bedside and Verbal shift change report given to Liz, RN (oncoming nurse) by Zach I, RN (offgoing nurse). Report included the following information SBAR.

## 2018-02-24 NOTE — Progress Notes (Signed)
Ortho:  See note from Dr. Rennie PlowmanHull this am.  IV steroids started yesterday. Still right buttocks pain. Confusion with PO percocet, now requires sitter.    Afebrile.  Tender right buttocks. Right SI no TTP.  Str right 5/5. Str left hip/knee motion 4/5, ankle/toe 5/5.  SILT.    I'll stop percocet to see if her mentation improves.  OK Norco.    Lianne CureWilliam R Jaycelyn Orrison, PA

## 2018-02-24 NOTE — Progress Notes (Signed)
Bedside and Verbal shift change report given to Christiane HaJonathan (Cabin crewoncoming nurse) by Marisue IvanLiz (offgoing nurse). Report included the following information SBAR, Kardex, Procedure Summary and Intake/Output.

## 2018-02-24 NOTE — Progress Notes (Signed)
Problem: Falls - Risk of  Goal: *Absence of Falls  Document Schmid Fall Risk and appropriate interventions in the flowsheet.  Fall Risk Interventions:  Mobility Interventions: Patient to call before getting OOB         Medication Interventions: Patient to call before getting OOB    Elimination Interventions: Patient to call for help with toileting needs, Call light in reach    History of Falls Interventions: Evaluate medications/consider consulting pharmacy

## 2018-02-24 NOTE — Progress Notes (Signed)
Problem: Mobility Impaired (Adult and Pediatric)  Goal: *Acute Goals and Plan of Care (Insert Text)  Physical Therapy Goals  Initiated 02/23/2018  1.  Patient will move from supine to sit and sit to supine , scoot up and down and roll side to side in bed with independence within 7 day(s).    2.  Patient will transfer from bed to chair and chair to bed with supervision/set-up using the least restrictive device within 7 day(s).  3.  Patient will perform sit to stand with supervision/set-up within 7 day(s).  4.  Patient will ambulate with supervision/set-up for 200 feet with the least restrictive device within 7 day(s).   5.  Patient will ascend/descend 15 stairs with 2 handrail(s) with minimal assistance/contact guard assist within 7 day(s).     Outcome: Progressing Towards Goal  physical Therapy TREATMENT  Patient: Kelsey [name] (67 y.o. female)  Date: 02/24/2018  Diagnosis: Hip pain [M25.559] <principal problem not specified>      Precautions: Fall  Chart, physical therapy assessment, plan of care and goals were reviewed.    ASSESSMENT:  Pt received in bed, agreeable to PT/OT at this time. Oriented to self, location, and day of week.  Pt required CGA-Min A for functional transfers and CGA-Min A x1 for gait training using RW +gait belt. Pt required freqent verbal cues/reminders for proper hand placement during sit<>stand. Pt c/o pain R upper lateral thigh and reports pain is "new" since yesterday compared to buttock pain she had been experiencing. Demonstrated ability to stand at sink w/ CGA, however noted pt RLE some what crossed in front of LLE (cues provided to uncross LE's). Pt returned to bed. Ice pack provided for aide w/ pain control. Pending progress, pt may benefit from Rehab vs Home w/ HHPT and 24/7 physical assist+supervision. Pt partner present and reported pt was not able to recently negotiate stairs d/t pain in RLE/buttocks.      Progression toward goals:   [x]     Improving appropriately and progressing toward goals  []     Improving slowly and progressing toward goals  []     Not making progress toward goals and plan of care will be adjusted     PLAN:  Patient continues to benefit from skilled intervention to address the above impairments.  Continue treatment per established plan of care.  Discharge Recommendations:  Rehab vs Home Health w/ 24/7 phys assist/supervision  Further Equipment Recommendations for Discharge:  TBD     SUBJECTIVE:   Patient stated ???I'm likely to call you anything.???    OBJECTIVE DATA SUMMARY:   Critical Behavior:  Neurologic State: Alert, Appropriate for age  Orientation Level: Oriented X4  Cognition: Follows commands     Functional Mobility Training:  Bed Mobility:     Supine to Sit: Contact guard assistance(provided  by OT)  Sit to Supine: Stand-by assistance           Transfers:  Sit to Stand: Minimum assistance  Stand to Sit: Minimum assistance                             Balance:  Sitting: Intact  Standing: Impaired;With support  Standing - Static: Constant support;Fair(RW)  Standing - Dynamic : FairAmbulation/Gait Training:  Distance (ft): 15 Feet (ft)(x2)  Assistive Device: Gait belt;Walker, rolling  Ambulation - Level of Assistance: Contact guard assistance;Minimal assistance;Assist x1;Assist x2        Gait Abnormalities: Antalgic;Decreased step clearance;Step to  gait        Base of Support: Widened  Stance: Right decreased  Speed/Cadence: Shuffled;Slow  Step Length: Left shortened;Right shortened  Swing Pattern: Right asymmetrical                          Pain:  Pain Scale 1: Numeric (0 - 10)  Pain Intensity 1: 4  Pain Location 1: Hip  Pain Orientation 1: Right  Pain Description 1: Aching  Pain Intervention(s) 1: Medication (see MAR)  Activity Tolerance:   Fair-Good  Please refer to the flowsheet for vital signs taken during this treatment.  After treatment:   []     Patient left in no apparent distress sitting up in chair   [x]     Patient left in no apparent distress in bed  [x]     Call bell left within reach  [x]     Nursing notified  [x]     Caregiver and Sitter present  []     Bed alarm activated    COMMUNICATION/COLLABORATION:   The patient???s plan of care was discussed with: Registered Nurse    Miranda A Means,PTA   Time Calculation: 17 mins

## 2018-02-24 NOTE — Progress Notes (Addendum)
Problem: Self Care Deficits Care Plan (Adult)  Goal: *Acute Goals and Plan of Care (Insert Text)  Occupational Therapy Goals  Initiated 02/24/2018  1.  Patient will perform lower body ADLs with AE supervision/set-up within 7 day(s).  2.  Patient will perform upper body ADLs standing 5 mins without fatigue or LOB with supervision/set-up within 7 day(s).  3.  Patient will perform toilet transfer with supervision/set-up within 7 day(s).  4.  Patient will perform all aspects of toileting with supervision/set-up within 7 day(s).  5.  Patient will participate in upper extremity therapeutic exercise/activities with supervision/set-up for 10 minutes within 7 day(s).    6.  Patient will utilize energy conservation techniques during functional activities without cues within 7 day(s).  Occupational Therapy EVALUATION  Patient: Kelsey [name] ([age] y.o. female)  Date: 02/24/2018  Primary Diagnosis: Hip pain [M25.559]       Precautions:   Fall    ASSESSMENT :  Based on the objective data described below, the patient presents with Setup upper body ADLs, Moderate Assistance lower body ADLs, and Minimum assistance and Additional time assist functional mobility with RW, s/p admission with R hip severe pain, now with mild vastus lateralis strain and possible injection today from ortho.  Currently, limited by pain however, tolerated up into bathroom [facility] and standing at sink for hand washing, intermittent lifting of R LE/attempt to cross it while standing.   Partner does to feel she can care for patient in this way due to bedroom on 2nd floor, however able to sleep in recliner/use bathroom downstairs if needed.  At this time feel patient is unsafe to return home safely, recommend IP rehab to maximize independence with ADls and mobility.     The following are barriers to independence while in acute care:   - Cognitive and/or behavioral: safety awareness, insight into deficits and insight into abilities   - Medical condition: strength, functional reach, standing balance and pain tolerance    - Other:   Fair cognition, however clearing with change in medication, incontinence noted during day also-only at night at home per patient, has service dog    Patient will benefit from skilled acute intervention to address the above impairments.  Patient???s rehabilitation potential is considered to be Excellent    Discharge recommendations: Rehab at inpatient facility: patient can tolerate 3 hours of therapy   Barriers to discharging home, in addition to above listed impairments: home environment unsafe due to requirment of constant support for balance.     Equipment recommendations for successful discharge (if) home: tbd     PLAN :  Recommendations and Planned Interventions: self care training, functional mobility training, therapeutic exercise, balance training, therapeutic activities, endurance activities and patient education    Frequency/Duration: Patient will be followed by occupational therapy 5 times a week to address goals.     SUBJECTIVE:   Patient stated ???My pain has changed some, its hurting here now (lateral).???    OBJECTIVE DATA SUMMARY:   HISTORY:   Past Medical History:   Diagnosis Date   ??? Acute MI (HCC)     x3   ??? Arthritis    ??? CAD (coronary artery disease)    ??? Cervical spondylolysis    ??? Chronic pain    ??? COPD    ??? Diabetes (HCC)    ??? GERD (gastroesophageal reflux disease)    ??? Goiter 04/20/2012   ??? Hypertension    ??? Neuropathy 04/06/2013     Past  Surgical History:   Procedure Laterality Date   ??? 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 CARDIAC SURG PROCEDURE UNLIST      PTCA   ??? HX KNEE ARTHROSCOPY Right     X2   ??? HX OTHER SURGICAL  1970    GANGLION CYST   ??? HX TONSILLECTOMY  1963       Prior Level of Function/Environment/Context: lives with family member, was I for functional mobility prior to injury at gym, recently since injury required constant support for balance and transferring from wheelchair to  toilet/chair etc.  Occupations in which the patient is/was successful, what are the barriers preventing that success:   Performance Patterns (routines, roles, habits, and rituals):   Personal Interests and/or values:   Expanded or extensive additional review of patient history:     Home Situation  Home Environment: Private residence  # Steps to Enter: 5  Rails to Enter: Yes  Hand Rails : Bilateral  One/Two Story Residence: Two Nutritional therapist of Interior Steps: 15  Interior Rails: Both  Living Alone: No  Support Systems: Games developer  Patient Expects to be Discharged to:: Private residence  Current DME Used/Available at Home: Cane, straight, Crutches, Raised toilet seat, Paediatric nurse, Environmental consultant, rolling  Tub or Shower Type: Shower    Hand dominance: RightEXAMINATION OF PERFORMANCE DEFICITS:  Cognitive/Behavioral Status:  Neurologic State: Alert;Appropriate for age  Orientation Level: Oriented X4  Cognition: Follows commands           Skin: intact    Edema: none noted    Hearing:  Auditory  Auditory Impairment: None  Vision/Perceptual:                    intact, galsses               Range of Motion:  B UE  AROM: Within functional limits                       Strength:  B UE 4/5-proximal, distal intact 5/5  Strength: Generally decreased, functional              Coordination:  Coordination: Within functional limits  Fine Motor Skills-Upper: Left Intact;Right Intact       Tone & Sensation:  B UE  Tone: Normal  Sensation: Intact                    Balance:  Sitting: Intact  Standing: Impaired;With support  Standing - Static: Constant support;Fair  Standing - Dynamic : Fair  Functional Mobility and Transfers for ADLs:Bed Mobility:  Supine to Sit: Contact guard assistance    Transfers:  Sit to Stand: Minimum assistance  Stand to Sit: Minimum assistance  Bathroom Mobility: Minimum assistance  Toilet Transfer : Minimum assistance(BSC)    ADL Assessment:  Feeding: Independent    Oral Facial Hygiene/Grooming: Setup     Bathing: Minimum assistance;Other (comment)(safety concerns, assist for balance)    Upper Body Dressing: Setup    Lower Body Dressing: Minimum assistance(safety concerns)    Toileting: Minimum assistance;Additional time(safety concerns)          Completed OT evaluation and ADLs seated EOB and standing as able with RW for balance. Educated on safety and endurance training with encouragement for full participation in ADLs while in hospital. Good understanding noted.      ADL Intervention and task modifications:  Grooming  Washing Hands: Supervision/set-up(standing at sink)          Completed bed mobility and up into bathroom with RW, constant support, some impulsivity noted with fair safety awareness, however improved from yesterday per family member in room. S to min A for toileting, VCs for safety with leaning, stood at sink for hand washing, patient attempting to lean on sink, responds well to VCs but fair safety awareness, able to stand to wash hands.   Educated on use of RW during standing activities and for donning pants/toileting needs, with 1 UE support at a time for fall prevention, good understanding would benefit from further education.         Lower Body Dressing Assistance  Protective Undergarmet: Moderate assistance  Socks: Supervision/set-up  Position Performed: Bending forward method    Toileting  Bladder Hygiene: Supervision/set-up  Clothing Management: Minimum assistance         Therapeutic Exercise:  encouraged OOB and full participation with ADLs to improve strength and endurance.     Functional Measure:  Barthel Index:    Bathing: 0  Bladder: 0  Bowels: 5  Grooming: 5  Dressing: 5  Feeding: 10  Mobility: 5  Stairs: 0  Toilet Use: 5  Transfer (Bed to Chair and Back): 10  Total: 45/100       Percentage of impairment   0%   1-19%   20-39%   40-59%   60-79%   80-99%   100%   Barthel Score 0-100 100 99-80 79-60 59-40 20-39 1-19   0     The Barthel ADL Index: Guidelines   1. The index should be used as a record of what a patient does, not as a record of what a patient could do.  2. The main aim is to establish degree of independence from any help, physical or verbal, however minor and for whatever reason.  3. The need for supervision renders the patient not independent.  4. A patient's performance should be established using the best available evidence. Asking the patient, friends/relatives and nurses are the usual sources, but direct observation and common sense are also important. However direct testing is not needed.  5. Usually the patient's performance over the preceding 24-48 hours is important, but occasionally longer periods will be relevant.  6. Middle categories imply that the patient supplies over 50 per cent of the effort.  7. Use of aids to be independent is allowed.    Clarisa Kindred., Barthel, D.W. [phone]). Functional evaluation: the Barthel Index. Md State Med J (14)2.  Zenaida Niece der [place], J.J.M.F, [place], Ian Malkin., Margret Chance., [place], [place]. (1999). Measuring the change indisability after inpatient rehabilitation; comparison of the responsiveness of the Barthel Index and Functional Independence Measure. Journal of Neurology, Neurosurgery, and Psychiatry, 66(4), [phone].  Dawson Bills, N.J.A, [place],  W.J.M, & Koopmanschap, M.A. (2004.) Assessment of post-stroke quality of life in cost-effectiveness studies: The usefulness of the Barthel Index and the EuroQoL-5D. Quality of Life Research, 62, 098-11       Occupational Therapy Evaluation Charge Determination   History Examination Decision-Making   MEDIUM Complexity : Expanded review of history including physical, cognitive and psychosocial  history  HIGH Complexity : 5 or more performance deficits relating to physical, cognitive , or psychosocial skils that result in activity limitations and / or participation restrictions MEDIUM Complexity : Patient may present with comorbidities  that affect occupational performnce. Miniml to moderate modification of tasks or assistance (eg, physical or verbal )  with assesment(s) is necessary to enable patient to complete evaluation       Based on the above components, the patient evaluation is determined to be of the following complexity level: MEDIUM  Pain:      Activity Tolerance:   Fair and requires rest breaks  Please refer to the flowsheet for vital signs taken during this treatment.    After treatment patient left:   Supine in bed  Caregiver at bedside  Call light within reach  RN notified   COMMUNICATION/EDUCATION:   The patient???s plan of care was discussed with: Physical Therapist and Registered Nurse.    Home safety education was provided and the patient/caregiver indicated understanding., Patient/family have participated as able in goal setting and plan of care. and Patient/family agree to work toward stated goals and plan of care.    This patient???s plan of care is appropriate for delegation to OTA.    Thank you for this referral.  Lennox Pippins Hunsucker, OT  Time Calculation: 27 mins

## 2018-02-24 NOTE — Consults (Signed)
[facility]  CONSULTATION    Name:  Haithcock, Canaan  MR#:  [id number]  DOB:  04/12/1951  ACCOUNT #:  [id number]  DATE OF SERVICE:  02/24/2018    ORTHOPEDIC CONSULTATION    HISTORY OF PRESENT ILLNESS:  The patient is a 66-year-old female who was admitted a few days ago with intractable right hip pain.  Symptoms seemed to be related temporarily to an incident in the gym about three weeks ago.  Pain has been progressive.  Initially, she was having more diffuse pain, but seems to localize, seems more now to the posterolateral hip and distal gluteal region.  She had a trochanteric injection in Dr. Pahle's office, which did not help.  Since being in the hospital, she has been on steroids and narcotic pain medication.  Mobility has been an issue.  Denies distal radicular pain.    PHYSICAL EXAMINATION:  Appears well.  Antalgic gait witnessed when she was getting from bedside commode back into bed.  Has no pain with motion of lumbar spine.  No paraspinal spasm or tenderness.  No nerve tension signs.  Minimal tenderness laterally over the trochanter.  Has significant ischial tenderness.  Pain in the same area with a little bit of distal radiation with passive stretch of the hamstring, but no true positive straight leg raise test.  Very well preserved hip rotation without pain.  No gross distal motor or sensory deficits.  Symmetrical palpable distal pulses.    IMAGING:  Right hip x-ray is unremarkable.  Recent MRI of the right hip demonstrates mild osteoarthritis.  No abductor tendinopathy.  Mild ischial bursitis evident.    IMPRESSION:  Suspect proximal hamstring tendinopathy and/or ischial bursitis.  She has severe pain.  We will ask Interventional Radiology to inject ischial bursa today.  May mobilize as tolerated with physical therapy.  Apparently, she may need a short rehab stay due to poor mobility.      Farah Lepak, MD      [id number]/  D:  02/24/2018 8:42  T:  02/24/2018 9:36  JOB #:  [id number]

## 2018-02-24 NOTE — Progress Notes (Signed)
Chart reviewed. Noted therapy is recommending inpatient rehab. CM attempted to meet with patient to discuss discharge planning. Patient is off the floor for testing. CM will follow-up.    Rowan BlaseMalin H Carlson , BSW/CRM

## 2018-02-24 NOTE — Consults (Signed)
Note dictated.    Suspect R ischial bursitis/prox hamstring tendonopathy. Will ask IR to inject today.    J Chaka Jefferys, MD

## 2018-02-25 LAB — GLUCOSE, POC
Glucose (POC): 207 mg/dL — ABNORMAL HIGH (ref 65–100)
Glucose (POC): 170 mg/dL — ABNORMAL HIGH (ref 65–100)
Glucose (POC): 216 mg/dL — ABNORMAL HIGH (ref 65–100)
Glucose (POC): 138 mg/dL — ABNORMAL HIGH (ref 65–100)

## 2018-02-25 MED ORDER — ACETAMINOPHEN 325 MG TABLET
325 mg | ORAL | Status: DC | PRN
Start: 2018-02-25 — End: 2018-02-26
  Administered 2018-02-25 – 2018-02-26 (×4): via ORAL

## 2018-02-25 MED FILL — PANTOPRAZOLE 40 MG TAB, DELAYED RELEASE: 40 mg | ORAL | Qty: 1

## 2018-02-25 MED FILL — ENOXAPARIN 40 MG/0.4 ML SUB-Q SYRINGE: 40 mg/0.4 mL | SUBCUTANEOUS | Qty: 0.4

## 2018-02-25 MED FILL — HYDROCODONE-ACETAMINOPHEN 5 MG-325 MG TAB: 5-325 mg | ORAL | Qty: 1

## 2018-02-25 MED FILL — INSULIN LISPRO 100 UNIT/ML INJECTION: 100 unit/mL | SUBCUTANEOUS | Qty: 1

## 2018-02-25 MED FILL — LOSARTAN 25 MG TAB: 25 mg | ORAL | Qty: 1

## 2018-02-25 MED FILL — CARVEDILOL 12.5 MG TAB: 12.5 mg | ORAL | Qty: 2

## 2018-02-25 MED FILL — MAGNESIUM OXIDE 400 MG TAB: 400 mg | ORAL | Qty: 1

## 2018-02-25 MED FILL — GABAPENTIN 300 MG CAP: 300 mg | ORAL | Qty: 1

## 2018-02-25 MED FILL — NORMAL SALINE FLUSH 0.9 % INJECTION SYRINGE: INTRAMUSCULAR | Qty: 10

## 2018-02-25 MED FILL — DULOXETINE 60 MG CAP, DELAYED RELEASE: 60 mg | ORAL | Qty: 1

## 2018-02-25 MED FILL — EZETIMIBE 10 MG TAB: 10 mg | ORAL | Qty: 1

## 2018-02-25 MED FILL — CLOPIDOGREL 75 MG TAB: 75 mg | ORAL | Qty: 1

## 2018-02-25 MED FILL — INSULIN GLARGINE 100 UNIT/ML INJECTION: 100 unit/mL | SUBCUTANEOUS | Qty: 0.1

## 2018-02-25 MED FILL — ASPIRIN 81 MG TAB, DELAYED RELEASE: 81 mg | ORAL | Qty: 1

## 2018-02-25 MED FILL — BUMETANIDE 1 MG TAB: 1 mg | ORAL | Qty: 2

## 2018-02-25 MED FILL — ACETAMINOPHEN 325 MG TABLET: 325 mg | ORAL | Qty: 2

## 2018-02-25 NOTE — Progress Notes (Addendum)
Care Management Interventions  PCP Verified by CM: Yes  Mode of Transport at Discharge: Other (see comment)(TBD)  Transition of Care Consult (CM Consult): Home Health, Discharge [place]: No  Reason Outside [place]: (patient stated she does not want to use [facility]. Will follow for home care orders and send referral to [facility])  MyChart Signup: No  Physical Therapy Consult: Yes  Occupational Therapy Consult: Yes  Speech Therapy Consult: No  Current Support Network: Lives with Spouse, Own Home  Confirm Follow Up Transport: Family  Plan discussed with Pt/Family/Caregiver: Yes  Freedom of Choice Offered: Yes  Discharge Location  Discharge Placement: Home with home health     Chart reviewed. CM met with patient at bedside. Patient lives with roommates in a two story house. Patient states she has everything she needs on one level. Patient stated she was going to outpatient therapy PTA.  Noted consult for rehab placement. CM discussed rehab options with patient. Patient stated she does not want to go to rehab. She prefers home with [facility]. Specific home care orders are needed in order to arrange services.  CM will continue to follow.    11:47 AM: Noted MD note indicates that patient cannot go home unless she is able to climb stairs. Additionally, she must be off narcotics. CM will follow.    Update 3:09: CM met with patient and wife Wells Guiles at bedside. The patient is now agreeable to rehab and would like a referral sent to [facility]. CM sent referral via [employer]. Care management will continue to follow.    Regenia Skeeter, BSW/CRM

## 2018-02-25 NOTE — Progress Notes (Signed)
Patient's wife reports patient's upper denture is missing. Last seen last evening at supper and was stored in a foam paper cup. She was in 578 at that time and had a sitter. She was moved to 560 in the night and no sitter present since moving. Reported to charge nurse, Charlott HollerSarah Nash.

## 2018-02-25 NOTE — Progress Notes (Signed)
Please note- patient is NOT able to go home unless she can climb steps with PT.  Bed is on the 2nd floor.  She has to be independent and off narcotics.

## 2018-02-25 NOTE — Other (Signed)
Walker order sent to [facility]. Awaiting delivery. [phone]

## 2018-02-25 NOTE — Progress Notes (Signed)
Problem: Mobility Impaired (Adult and Pediatric)  Goal: *Acute Goals and Plan of Care (Insert Text)  Physical Therapy Goals  Initiated 02/23/2018  1.  Patient will move from supine to sit and sit to supine , scoot up and down and roll side to side in bed with independence within 7 day(s).    2.  Patient will transfer from bed to chair and chair to bed with supervision/set-up using the least restrictive device within 7 day(s).  3.  Patient will perform sit to stand with supervision/set-up within 7 day(s).  4.  Patient will ambulate with supervision/set-up for 200 feet with the least restrictive device within 7 day(s).   5.  Patient will ascend/descend 15 stairs with 2 handrail(s) with minimal assistance/contact guard assist within 7 day(s).     physical Therapy TREATMENT  Patient: Kelsey [name] ([age] y.o. female)  Date: 02/25/2018  Diagnosis: Hip pain [M25.559] <principal problem not specified>      Precautions: Fall  Chart, physical therapy assessment, plan of care and goals were reviewed.    ASSESSMENT:  Patient states that her right buttock feels a little better after injection, but right [ITB] and right lateral and anterior knee are quite painful with weight bearing. Presented sitting in chair. Transferred with contact guard assistance. Ambulated with RW and antalgic gait, flexed forward, frequently leaning forward onto walker to rest, non-continuous steps, decreased swing and stance right. Ambulated 50 ft today (improved from 15 ft x 2 yesterday), but complained of pain. States that she has a knee brace at home, so recommended that her roommate bring it in to try for ambulation. Ordered RW for patient as she does not have one at this time. Discussed home situation. Patient and caregiver state that patient could live on the first floor in a bed that is close to handicapped accessible bathroom. The biggest issue is that patient would be alone as the other two members of the household work full-time. At this time,  patient is unable to negotiate 14 steps to get to the second floor.    She will benefit from In Patient Rehab  in order to achieve maximum level of safe, functional mobility, balance and return to modified  independent PLOF.   Progression toward goals:  []     Improving appropriately and progressing toward goals  [x]     Improving slowly and progressing toward goals  []     Not making progress toward goals and plan of care will be adjusted     PLAN:  Patient continues to benefit from skilled intervention to address the above impairments.  Continue treatment per established plan of care.  Discharge Recommendations:  Inpatient Rehab  Further Equipment Recommendations for Discharge:  rolling walker (ordered)     SUBJECTIVE:   Patient stated ???I don't know what is going on.???    OBJECTIVE DATA SUMMARY:   Critical Behavior:  Neurologic State: Alert  Orientation Level: Oriented X4  Cognition: Decreased attention/concentration, Decreased command following  Safety/Judgement: Decreased insight into deficits, Decreased awareness of need for safety  Functional Mobility Training:  Bed Mobility:     Supine to Sit: (Received up in chair)  Sit to Supine: (Received up in chair)           Transfers:  Sit to Stand: Stand-by assistance  Stand to Sit: Stand-by assistance        Bed to Chair: Contact guard assistance  Balance:  Sitting: Intact  Standing: Impaired;With support  Standing - Static: Good;Constant support  Standing - Dynamic : FairAmbulation/Gait Training:  Distance (ft): 50 Feet (ft)  Assistive Device: Gait belt;Walker, rolling  Ambulation - Level of Assistance: Contact guard assistance        Gait Abnormalities: Antalgic;Decreased step clearance;Trunk sway increased        Base of Support: Shift to left;Widened  Stance: Right decreased  Speed/Cadence: Pace decreased (<100 feet/min)(Non-continuous steps)  Step Length: Right shortened  Swing Pattern: Right asymmetrical                     Activity Tolerance:    Fair-improving  .  After treatment:   [x]     Patient left in no apparent distress sitting up in chair  []     Patient left in no apparent distress in bed  [x]     Call bell left within reach  [x]     Nursing notified  [x]     Caregiver present  []     Bed alarm activated    COMMUNICATION/COLLABORATION:   The patient???s plan of care was discussed with: Occupational Therapist, Registered Nurse and Social Worker    Billey Gosling   Time Calculation: 30 mins

## 2018-02-25 NOTE — Progress Notes (Signed)
Bedside and Verbal shift change report given to Jenel LucksKristy Nelligan (oncoming nurse) by Margit BandaBetty Waldrop (offgoing nurse). Report included the following information SBAR, Kardex, Intake/Output and MAR.

## 2018-02-25 NOTE — Progress Notes (Signed)
Medical Progress Note      NAME: Kelsey [name]   DOB:  1951-08-20  MRM:  [id number]    Date/Time: 02/25/2018  7:58 AM    Problem List:   Active Problems:    Hip pain (02/22/2018)           Subjective:     Patient feeling better.    Past Medical History:   Diagnosis Date   ??? Acute MI (HCC)     x3   ??? Arthritis    ??? CAD (coronary artery disease)    ??? Cervical spondylolysis    ??? Chronic pain    ??? COPD    ??? Diabetes (HCC)    ??? GERD (gastroesophageal reflux disease)    ??? Goiter 04/20/2012   ??? Hypertension    ??? Neuropathy 04/06/2013       ROS:  General: negative for fever, chills, sweats, weakness  Respiratory:  negative for cough, sputum production, SOB, wheezing, DOE, pleuritic pain  Cardiology:  negative for chest pain, palpitations, orthopnea, PND, edema, syncope   Gastrointestinal: negative for abdominal pain, N/V, dysphagia, change in bowel habits, bleeding         Objective:       Vitals:          Last 24hrs VS reviewed since prior progress note. Most recent are:    Visit Vitals  BP 170/75 (BP 1 Location: Right arm)   Pulse 63   Temp 97.9 ??F (36.6 ??C)   Resp 16   Ht 5\' 4"  (1.626 m)   Wt 201 lb 4.5 oz (91.3 kg)   SpO2 98%   BMI 34.55 kg/m??     SpO2 Readings from Last 6 Encounters:   02/25/18 98%   10/23/16 97%   10/21/16 95%   10/20/16 100%   10/18/16 95%   10/17/16 93%            Intake/Output Summary (Last 24 hours) at 02/25/2018 0758  Last data filed at 02/24/2018 2041  Gross per 24 hour   Intake 1140 ml   Output ???   Net 1140 ml          Exam:     General   well developed, well nourished, appears stated age, in no acute distress  Respiratory   Coarse breath sounds  Cardiology  regular  Abdominal  Soft, non-tender, non-distended, positive bowel sounds, no hepatosplenomegaly  Extremities  Right hip minimally tender today.    Lab Data Reviewed: (see below)    Medications Reviewed: (see below)    ______________________________________________________________________    Medications:      Current Facility-Administered Medications   Medication Dose Route Frequency   ??? insulin glargine (LANTUS) injection 10 Units  10 Units SubCUTAneous QHS   ??? aspirin delayed-release tablet 81 mg  81 mg Oral DAILY   ??? bumetanide (BUMEX) tablet 2 mg  2 mg Oral DAILY   ??? carvedilol (COREG) tablet 25 mg  25 mg Oral BID WITH MEALS   ??? clopidogrel (PLAVIX) tablet 75 mg  75 mg Oral DAILY   ??? DULoxetine (CYMBALTA) capsule 60 mg  60 mg Oral DAILY   ??? ezetimibe (ZETIA) tablet 10 mg  10 mg Oral DAILY   ??? losartan (COZAAR) tablet 25 mg  25 mg Oral DAILY   ??? magnesium oxide (MAG-OX) tablet 400 mg  400 mg Oral DAILY   ??? pantoprazole (PROTONIX) tablet 40 mg  40 mg Oral ACB   ??? sodium chloride (NS) flush  5-40 mL  5-40 mL IntraVENous Q8H   ??? sodium chloride (NS) flush 5-40 mL  5-40 mL IntraVENous PRN   ??? naloxone (NARCAN) injection 0.4 mg  0.4 mg IntraVENous PRN   ??? enoxaparin (LOVENOX) injection 40 mg  40 mg SubCUTAneous Q24H   ??? gabapentin (NEURONTIN) capsule 300 mg  300 mg Oral TID   ??? insulin lispro (HUMALOG) injection   SubCUTAneous TIDAC   ??? glucose chewable tablet 16 g  4 Tab Oral PRN   ??? dextrose (D50W) injection syrg 12.5-25 g  12.5-25 g IntraVENous PRN   ??? glucagon (GLUCAGEN) injection 1 mg  1 mg IntraMUSCular PRN            Lab Review:     Recent Labs     02/22/18  1128   WBC 8.6   HGB 12.9   HCT 39.0   PLT 249     Recent Labs     02/23/18  0226 02/22/18  1128   NA 133* 131*   K 5.0 4.6   CL 100 96*   CO2 27 28   GLU 156* 123*   BUN 24* 28*   CREA 0.91 0.97   CA 9.1 8.8   ALB 3.2* 3.4*   TBILI 0.2 0.2   SGOT 15 16   ALT 29 29     Lab Results   Component Value Date/Time    Glucose (POC) 170 (H) 02/25/2018 06:29 AM    Glucose (POC) 207 (H) 02/24/2018 10:12 PM    Glucose (POC) 282 (H) 02/24/2018 05:41 PM    Glucose (POC) 205 (H) 02/24/2018 06:53 AM    Glucose (POC) 158 (H) 02/23/2018 09:10 PM     No results for input(s): PH, PCO2, PO2, HCO3, FIO2 in the last 72 hours.  No results for input(s): INR in the last 72 hours.     No lab exists for component: INREXT    Other pertinent lab: NA         Assessment:     Patient Active Problem List   Diagnosis Code   ??? Coronary atherosclerosis of native coronary artery I25.10   ??? Other chest pain R07.89   ??? Other dyspnea and respiratory abnormality R06.09, R09.89   ??? Mixed hyperlipidemia E78.2   ??? Type II diabetes mellitus, uncontrolled (HCC) E11.65   ??? Dyspnea R06.00   ??? Goiter E04.9   ??? Neuropathy G62.9   ??? Long term (current) use of insulin (HCC) Z79.4   ??? Hypertension complicating diabetes (HCC) E11.59, I10   ??? CAD (coronary artery disease) I25.10   ??? 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 Hypertensive urgency I16.0   ??? Anemia D64.9   ??? 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 Type 2 diabetes mellitus with diabetic neuropathy (HCC) E11.40   ??? Atherosclerosis of aorta (HCC) I70.0   ??? Severe obesity with body mass index (BMI) of 35.0 to 39.9 with serious comorbidity (HCC) E66.01   ??? 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 Hip pain M25.559          Plan:                 1. Hip pain- appreciate Dr. Dayton ScrapeHull's and IR 's with pain  2. Diabetes- continue insulin  3. Stop narcotics. If patient able to manage can be discharged home. If she requires narcotics she will need to go to rehab since she gets very confused and falls while on them.  ___________________________________________________    Attending Physician: Genene Churn, MD

## 2018-02-25 NOTE — Progress Notes (Signed)
Problem: Self Care Deficits Care Plan (Adult)  Goal: *Acute Goals and Plan of Care (Insert Text)  Occupational Therapy Goals  Initiated 02/24/2018  1.  Patient will perform lower body ADLs with AE supervision/set-up within 7 day(s).  2.  Patient will perform upper body ADLs standing 5 mins without fatigue or LOB with supervision/set-up within 7 day(s).  3.  Patient will perform toilet transfer with supervision/set-up within 7 day(s).  4.  Patient will perform all aspects of toileting with supervision/set-up within 7 day(s).  5.  Patient will participate in upper extremity therapeutic exercise/activities with supervision/set-up for 10 minutes within 7 day(s).    6.  Patient will utilize energy conservation techniques during functional activities without cues within 7 day(s).   Occupational Therapy TREATMENT  Patient: Kelsey [name] (09(67 y.o. female)  Date: 02/25/2018  Diagnosis: Hip pain [M25.559] <principal problem not specified>      Precautions: Fall  Chart, occupational therapy assessment, plan of care, and goals were reviewed.    ASSESSMENT:  Pt cleared by nursing and received in bed.  She progressed OOB to bathroom attempting first to mobilize without walker but deemed unsafe.  Pt progressed with RW and CG and cues for safety.  She does have decreased safety awareness overall due to impaired cognition.  Pt completed lower body dressing with supervision sitting EOB to don socks, toileting completed with CG and overall doing well and making good progress.  She may be able to discharge home with assistance from wife 24/7 but if family unable to provide needed support, she may require discharge to rehab facility.     Progression toward goals:  [x]        Improving appropriately and progressing toward goals  []        Improving slowly and progressing toward goals  []        Not making progress toward goals and plan of care will be adjusted     PLAN:  Patient continues to benefit from skilled intervention to address the  above impairments.  Continue treatment per established plan of care.  Discharge Recommendations:  Rehab vs Home Health  Further Equipment Recommendations for Discharge:  TBD     SUBJECTIVE:   Patient stated ???I am feeling alright.???    OBJECTIVE DATA SUMMARY:   Cognitive/Behavioral Status:  Neurologic State: Alert  Orientation Level: Oriented X4  Cognition: Decreased attention/concentration;Decreased command following  Perception: Appears intact  Perseveration: No perseveration noted  Safety/Judgement: Decreased insight into deficits;Decreased awareness of need for safety  Functional Mobility and Transfers for ADLs:Bed Mobility:  Supine to Sit: Supervision  Sit to Supine: (remained in chair)    Transfers:  Sit to Stand: Contact guard assistance  Functional Transfers  Bathroom Mobility: Contact guard assistance(attempted with and without walker)  Toilet Transfer : Contact guard assistance  Bed to Chair: Contact guard assistance    Balance:  Sitting: Intact  Standing: Impaired;With support  Standing - Static: Constant support  Standing - Dynamic : Fair  ADL Intervention:       Grooming  Grooming Assistance: Contact guard assistance(standing at the sink)  Washing Face: Contact guard assistance  Washing Hands: Contact guard assistance                   Lower Body Dressing Assistance  Dressing Assistance: Supervision/set up  Socks: Supervision/set-up(using a cross leg technique)    Toileting  Toileting Assistance: Contact guard assistance  Bladder Hygiene: Contact guard assistance  Clothing Management: Contact guard assistance  Cognitive Retraining  Safety/Judgement: Decreased insight into deficits;Decreased awareness of need for safety  Pain:  Pain Scale 1: Numeric (0 - 10)  Pain Intensity 1: 3  Pain Location 1: Hip  Pain Orientation 1: Right  Pain Description 1: Sore  Pain Intervention(s) 1: Rest  Activity Tolerance:   VSS throughout session.     After treatment:    [x]  Patient left in no apparent distress sitting up in chair  []  Patient left in no apparent distress in bed  [x]  Call bell left within reach  [x]  Nursing notified  []  Caregiver present  []  Bed alarm activated    COMMUNICATION/COLLABORATION:   The patient???s plan of care was discussed with: Physical Therapist and Registered Nurse    Santa Lighter, OT  Time Calculation: 27 mins

## 2018-02-25 NOTE — Other (Signed)
DTC Progress Note    Recommendations/ Comments: Chart reviewed due to hyperglycemia.  If appropriate, please consider increasing Lantus to 25 units.     Current hospital DM medication: Lantus 10 units and correction scale Humalog with normal sensitivity.     Chart reviewed on Kelsey [name].    Patient is a 67 y.o. female with known diabetes on Metformin 1000 mg, Levemir 34 units hs  BID and Humalog at meals prn at home.    A1c:   No results found for: HBA1C, HGBE8, HBA1CEXT    Recent Glucose Results:   Lab Results   Component Value Date/Time    GLUCPOC 216 (H) 02/25/2018 11:11 AM    GLUCPOC 170 (H) 02/25/2018 06:29 AM    GLUCPOC 207 (H) 02/24/2018 10:12 PM        Lab Results   Component Value Date/Time    Creatinine 0.91 02/23/2018 02:26 AM     Estimated Creatinine Clearance: 65.6 mL/min (based on SCr of 0.91 mg/dL).    Active Orders   Diet    DIET DIABETIC CONSISTENT CARB Regular        PO intake:   Patient Vitals for the past 72 hrs:   % Diet Eaten   02/25/18 0900 100 %   02/24/18 1720 100 %   02/24/18 0911 100 %       Will continue to follow as needed.    Thank you    Kelsey [name], RD, CDE    Time spent: 5 minutes

## 2018-02-25 NOTE — Progress Notes (Signed)
Patient's wife reports patient has not had a bowel movement in a week. No complaints of abdominal discomfort or distention, appetite is fair. May need to pursue a laxative program for her.

## 2018-02-25 NOTE — Progress Notes (Signed)
Bedside shift change report given to Cody (oncoming nurse) by Kristi (offgoing nurse). Report included the following information SBAR.

## 2018-02-26 ENCOUNTER — Encounter: Attending: Internal Medicine | Primary: Internal Medicine

## 2018-02-26 LAB — GLUCOSE, POC
Glucose (POC): 198 mg/dL — ABNORMAL HIGH (ref 65–100)
Glucose (POC): 192 mg/dL — ABNORMAL HIGH (ref 65–100)
Glucose (POC): 197 mg/dL — ABNORMAL HIGH (ref 65–100)
Glucose (POC): 262 mg/dL — ABNORMAL HIGH (ref 65–100)

## 2018-02-26 MED ORDER — MAGNESIUM HYDROXIDE 400 MG/5 ML ORAL SUSP
400 mg/5 mL | Freq: Every day | ORAL | Status: DC | PRN
Start: 2018-02-26 — End: 2018-02-26

## 2018-02-26 MED ORDER — POLYETHYLENE GLYCOL 3350 17 GRAM (100 %) ORAL POWDER PACKET
17 gram | Freq: Every day | ORAL | Status: DC
Start: 2018-02-26 — End: 2018-02-26

## 2018-02-26 MED ORDER — LIDOCAINE 5 % (700 MG/PATCH) ADHESIVE PATCH
5 % | CUTANEOUS | Status: DC
Start: 2018-02-26 — End: 2018-02-26

## 2018-02-26 MED ORDER — DOCUSATE SODIUM 100 MG CAP
100 mg | Freq: Two times a day (BID) | ORAL | Status: DC
Start: 2018-02-26 — End: 2018-02-26
  Administered 2018-02-26: 16:00:00 via ORAL

## 2018-02-26 MED ORDER — LIDOCAINE 5 % (700 MG/PATCH) ADHESIVE PATCH
5 % | CUTANEOUS | 2 refills | Status: DC
Start: 2018-02-26 — End: 2018-03-08

## 2018-02-26 MED ORDER — POLYETHYLENE GLYCOL 3350 17 GRAM (100 %) ORAL POWDER PACKET
17 gram | Freq: Two times a day (BID) | ORAL | Status: DC
Start: 2018-02-26 — End: 2018-02-26
  Administered 2018-02-26: 22:00:00 via ORAL

## 2018-02-26 MED ORDER — INSULIN GLARGINE 100 UNIT/ML INJECTION
100 unit/mL | Freq: Every evening | SUBCUTANEOUS | Status: DC
Start: 2018-02-26 — End: 2018-02-26
  Administered 2018-02-26: 04:00:00 via SUBCUTANEOUS

## 2018-02-26 MED FILL — PANTOPRAZOLE 40 MG TAB, DELAYED RELEASE: 40 mg | ORAL | Qty: 1

## 2018-02-26 MED FILL — POLYETHYLENE GLYCOL 3350 17 GRAM (100 %) ORAL POWDER PACKET: 17 gram | ORAL | Qty: 1

## 2018-02-26 MED FILL — GABAPENTIN 300 MG CAP: 300 mg | ORAL | Qty: 1

## 2018-02-26 MED FILL — EZETIMIBE 10 MG TAB: 10 mg | ORAL | Qty: 1

## 2018-02-26 MED FILL — ENOXAPARIN 40 MG/0.4 ML SUB-Q SYRINGE: 40 mg/0.4 mL | SUBCUTANEOUS | Qty: 0.4

## 2018-02-26 MED FILL — LANTUS U-100 INSULIN 100 UNIT/ML SUBCUTANEOUS SOLUTION: 100 unit/mL | SUBCUTANEOUS | Qty: 0.15

## 2018-02-26 MED FILL — LOSARTAN 25 MG TAB: 25 mg | ORAL | Qty: 1

## 2018-02-26 MED FILL — INSULIN LISPRO 100 UNIT/ML INJECTION: 100 unit/mL | SUBCUTANEOUS | Qty: 1

## 2018-02-26 MED FILL — ACETAMINOPHEN 325 MG TABLET: 325 mg | ORAL | Qty: 2

## 2018-02-26 MED FILL — ASPIRIN 81 MG TAB, DELAYED RELEASE: 81 mg | ORAL | Qty: 1

## 2018-02-26 MED FILL — NORMAL SALINE FLUSH 0.9 % INJECTION SYRINGE: INTRAMUSCULAR | Qty: 10

## 2018-02-26 MED FILL — DULOXETINE 60 MG CAP, DELAYED RELEASE: 60 mg | ORAL | Qty: 1

## 2018-02-26 MED FILL — BUMETANIDE 1 MG TAB: 1 mg | ORAL | Qty: 2

## 2018-02-26 MED FILL — CLOPIDOGREL 75 MG TAB: 75 mg | ORAL | Qty: 1

## 2018-02-26 MED FILL — MAGNESIUM OXIDE 400 MG TAB: 400 mg | ORAL | Qty: 1

## 2018-02-26 MED FILL — INSULIN GLARGINE 100 UNIT/ML INJECTION: 100 unit/mL | SUBCUTANEOUS | Qty: 0.15

## 2018-02-26 MED FILL — DOK 100 MG CAPSULE: 100 mg | ORAL | Qty: 1

## 2018-02-26 NOTE — Progress Notes (Signed)
Medical Progress Note      NAME: Kelsey [name]   DOB:  05-24-1951  MRM:  [id number]    Date/Time: 02/26/2018  11:23 AM    Problem List:   Active Problems:    Hip pain (02/22/2018)           Subjective:     Patient feeling better. Would like narcotics for pain.  Discussed side effects and agree she will try lidoderm patch first.    Past Medical History:   Diagnosis Date   ??? Acute MI (HCC)     x3   ??? Arthritis    ??? CAD (coronary artery disease)    ??? Cervical spondylolysis    ??? Chronic pain    ??? COPD    ??? Diabetes (HCC)    ??? GERD (gastroesophageal reflux disease)    ??? Goiter 04/20/2012   ??? Hypertension    ??? Neuropathy 04/06/2013       ROS:  Respiratory:  negative for cough, sputum production, SOB, wheezing, DOE, pleuritic pain  Cardiology:  negative for chest pain, palpitations, orthopnea, PND, edema, syncope   Gastrointestinal: positive for constipation         Objective:       Vitals:          Last 24hrs VS reviewed since prior progress note. Most recent are:    Visit Vitals  BP 140/67   Pulse 66   Temp 97.5 ??F (36.4 ??C)   Resp 16   Ht 5\' 4"  (1.626 m)   Wt 201 lb 4.5 oz (91.3 kg)   SpO2 99%   BMI 34.55 kg/m??     SpO2 Readings from Last 6 Encounters:   02/26/18 99%   10/23/16 97%   10/21/16 95%   10/20/16 100%   10/18/16 95%   10/17/16 93%            Intake/Output Summary (Last 24 hours) at 02/26/2018 1123  Last data filed at 02/25/2018 1200  Gross per 24 hour   Intake 240 ml   Output ???   Net 240 ml          Exam:     General   well developed, well nourished, appears stated age, in no acute distress  Respiratory   Coarse breath sounds  Cardiology  regular  Abdominal  Soft, non-tender, non-distended, positive bowel sounds, no hepatosplenomegaly  Extremities  No clubbing, cyanosis, or edema. Pulses intact.    Lab Data Reviewed: (see below)    Medications Reviewed: (see below)    ______________________________________________________________________    Medications:     Current Facility-Administered Medications    Medication Dose Route Frequency   ??? polyethylene glycol (MIRALAX) packet 17 g  17 g Oral ACB&D   ??? magnesium hydroxide (MILK OF MAGNESIA) 400 mg/5 mL oral suspension 30 mL  30 mL Oral DAILY PRN   ??? docusate sodium (COLACE) capsule 100 mg  100 mg Oral BID   ??? lidocaine (LIDODERM) 5 % patch 1 Patch  1 Patch TransDERmal Q24H   ??? acetaminophen (TYLENOL) tablet 650 mg  650 mg Oral Q4H PRN   ??? insulin glargine (LANTUS) injection 15 Units  15 Units SubCUTAneous QHS   ??? aspirin delayed-release tablet 81 mg  81 mg Oral DAILY   ??? bumetanide (BUMEX) tablet 2 mg  2 mg Oral DAILY   ??? carvedilol (COREG) tablet 25 mg  25 mg Oral BID WITH MEALS   ??? clopidogrel (PLAVIX) tablet 75 mg  75  mg Oral DAILY   ??? DULoxetine (CYMBALTA) capsule 60 mg  60 mg Oral DAILY   ??? ezetimibe (ZETIA) tablet 10 mg  10 mg Oral DAILY   ??? losartan (COZAAR) tablet 25 mg  25 mg Oral DAILY   ??? magnesium oxide (MAG-OX) tablet 400 mg  400 mg Oral DAILY   ??? pantoprazole (PROTONIX) tablet 40 mg  40 mg Oral ACB   ??? sodium chloride (NS) flush 5-40 mL  5-40 mL IntraVENous Q8H   ??? sodium chloride (NS) flush 5-40 mL  5-40 mL IntraVENous PRN   ??? naloxone (NARCAN) injection 0.4 mg  0.4 mg IntraVENous PRN   ??? enoxaparin (LOVENOX) injection 40 mg  40 mg SubCUTAneous Q24H   ??? gabapentin (NEURONTIN) capsule 300 mg  300 mg Oral TID   ??? insulin lispro (HUMALOG) injection   SubCUTAneous TIDAC   ??? glucose chewable tablet 16 g  4 Tab Oral PRN   ??? dextrose (D50W) injection syrg 12.5-25 g  12.5-25 g IntraVENous PRN   ??? glucagon (GLUCAGEN) injection 1 mg  1 mg IntraMUSCular PRN            Lab Review:     No results for input(s): WBC, HGB, HCT, PLT, HGBEXT, HCTEXT, PLTEXT in the last 72 hours.  No results for input(s): NA, K, CL, CO2, GLU, BUN, CREA, CA, MG, PHOS, ALB, TBIL, TBILI, SGOT, ALT, INR in the last 72 hours.    No lab exists for component: INREXT  Lab Results   Component Value Date/Time    Glucose (POC) 197 (H) 02/26/2018 11:21 AM     Glucose (POC) 192 (H) 02/26/2018 06:39 AM    Glucose (POC) 198 (H) 02/25/2018 09:09 PM    Glucose (POC) 138 (H) 02/25/2018 04:22 PM    Glucose (POC) 216 (H) 02/25/2018 11:11 AM     No results for input(s): PH, PCO2, PO2, HCO3, FIO2 in the last 72 hours.  No results for input(s): INR in the last 72 hours.    No lab exists for component: INREXT    Other pertinent lab: NA         Assessment:     Patient Active Problem List   Diagnosis Code   ??? Coronary atherosclerosis of native coronary artery I25.10   ??? Other chest pain R07.89   ??? Other dyspnea and respiratory abnormality R06.09, R09.89   ??? Mixed hyperlipidemia E78.2   ??? Type II diabetes mellitus, uncontrolled (HCC) E11.65   ??? Dyspnea R06.00   ??? Goiter E04.9   ??? Neuropathy G62.9   ??? Long term (current) use of insulin (HCC) Z79.4   ??? Hypertension complicating diabetes (HCC) E11.59, I10   ??? CAD (coronary artery disease) I25.10   ??? 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 Hypertensive urgency I16.0   ??? Anemia D64.9   ??? 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 Type 2 diabetes mellitus with diabetic neuropathy (HCC) E11.40   ??? Atherosclerosis of aorta (HCC) I70.0   ??? Severe obesity with body mass index (BMI) of 35.0 to 39.9 with serious comorbidity (HCC) E66.01   ??? 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 Hip pain M25.559          Plan:                 1. Hip bursitis- still having pain, try lidoderm patch  2. Diabetes- insulin  3. Rehab consult  ___________________________________________________    Attending Physician: Genene Churn, MD

## 2018-02-26 NOTE — Progress Notes (Signed)
PT clearance per N/A, [facility] set up per CRM, [facility]    I have reviewed discharge instructions with the patient.  The patient verbalized understanding. All belongings packed and sent to DC; phone wallet keys glasses prescriptions, instructions. IV removed.

## 2018-02-26 NOTE — Progress Notes (Signed)
Problem: Mobility Impaired (Adult and Pediatric)  Goal: *Acute Goals and Plan of Care (Insert Text)  Physical Therapy Goals  Initiated 02/23/2018  1.  Patient will move from supine to sit and sit to supine , scoot up and down and roll side to side in bed with independence within 7 day(s).    2.  Patient will transfer from bed to chair and chair to bed with supervision/set-up using the least restrictive device within 7 day(s).  3.  Patient will perform sit to stand with supervision/set-up within 7 day(s).  4.  Patient will ambulate with supervision/set-up for 200 feet with the least restrictive device within 7 day(s).   5.  Patient will ascend/descend 15 stairs with 2 handrail(s) with minimal assistance/contact guard assist within 7 day(s).     physical Therapy TREATMENT  Patient: Kelsey [name] (67 y.o. female)  Date: 02/26/2018  Diagnosis: Hip pain [M25.559] <principal problem not specified>      Precautions: Fall  Chart, physical therapy assessment, plan of care and goals were reviewed.    ASSESSMENT:  Patient presents with impairment in functional mobility, activity tolerance and balance s/p flair up of R low back and hip pain. PLOF:  Modified independent with ADLs and IADLs, including driving self to [facility]. Initially patient only tolerated 5 ft of ambulation. After injections for her pain, she has worked up to 100 ft with minimal assistance, RW, gait belt and knee brace (from home). Pain in R knee, R ITB and R buttocks rated at 6/10 after ambulation. Short steps, decreased swing and stance on R, forward flexed. Requires minimal assistance for bed mobility and transfers due to pain, impulsiveness, fall risk.     She will benefit from In Patient Rehab in order to achieve maximum level of safe, functional mobility, balance and return to modified independent PLOF in her home.   Progression toward goals:  [x]     Improving appropriately and progressing toward goals  []     Improving slowly and progressing toward goals   []     Not making progress toward goals and plan of care will be adjusted     PLAN:  Patient continues to benefit from skilled intervention to address the above impairments.  Continue treatment per established plan of care.  Discharge Recommendations:  Inpatient Rehab  Further Equipment Recommendations for Discharge:  Rolling walker (ordered and received)     SUBJECTIVE:   Patient stated ???I'm doing better today.???    OBJECTIVE DATA SUMMARY:   Critical Behavior:  Neurologic State: Alert  Orientation Level: Oriented to person, Oriented to place, Oriented to situation  Cognition: Appropriate decision making, Appropriate for age attention/concentration  Safety/Judgement: Decreased awareness of environment, Decreased awareness of need for assistance, Decreased awareness of need for safety  Functional Mobility Training:  Bed Mobility:  Rolling: Contact guard assistance  Supine to Sit: Minimum assistance  Sit to Supine: Minimum assistance  Scooting: Minimum assistance        Transfers:  Sit to Stand: Minimum assistance  Stand to Sit: Minimum assistance        Bed to Chair: Minimum assistance                    Balance:  Sitting: Intact  Standing: Intact;With support  Standing - Static: Fair  Standing - Dynamic : FairAmbulation/Gait Training:  Distance (ft): 110 Feet (ft)  Assistive Device: Brace/Splint;Gait belt;Walker, rolling(right lnee brace for stability)  Ambulation - Level of Assistance: Minimal assistance(Cues for safe gait  technique)        Gait Abnormalities: Antalgic;Decreased step clearance;Trunk sway increased;Other(Non-continuous steps)        Base of Support: Widened;Shift to left  Stance: Right decreased  Speed/Cadence: Pace decreased (<100 feet/min)  Step Length: Right shortened;Left shortened  Swing Pattern: Right asymmetrical            Stairs: still unable    Barthel Index:    Bathing: 0  Bladder: 10  Bowels: 10  Grooming: 5  Dressing: 5  Feeding: 10  Mobility: 10  Stairs: 0(still unable)  Toilet Use: 5   Transfer (Bed to Chair and Back): 10  Total: 65/100      Percentage of impairment   0%      20-39%   40-59%   60-79%   80-99%   100%   Barthel Score 0-100 100 99-80 79-60 59-40 20-39 1-19   0     The Barthel ADL Index: Guidelines  1. The index should be used as a record of what a patient does, not as a record of what a patient could do.  2. The main aim is to establish degree of independence from any help, physical or verbal, however minor and for whatever reason.  3. The need for supervision renders the patient not independent.  4. A patient's performance should be established using the best available evidence. Asking the patient, friends/relatives and nurses are the usual sources, but direct observation and common sense are also important. However direct testing is not needed.  5. Usually the patient's performance over the preceding 24-48 hours is important, but occasionally longer periods will be relevant.  6. Middle categories imply that the patient supplies over 50 per cent of the effort.  7. Use of aids to be independent is allowed.    Clarisa KindredMahoney, F.l., Barthel, D.W. [phone](1965). Functional evaluation: the Barthel Index. Md State Med J (14)2.  Zenaida NieceVan der Riverview Estates[place], J.J.M.F, [place], Ian MalkinJ.C., Margret ChanceFreeman, J.A., [place], [place].J. (1999). Measuring the change indisability after inpatient rehabilitation; comparison of the responsiveness of the Barthel Index and Functional Independence Measure. Journal of Neurology, Neurosurgery, and Psychiatry, 66(4), [phone].  Dawson BillsVan Exel, N.J.A, Scholte op RoffReimer,  W.J.M, & Koopmanschap, M.A. (2004.) Assessment of post-stroke quality of life in cost-effectiveness studies: The usefulness of the Barthel Index and the EuroQoL-5D. Quality of Life Research, 13, 427-43       Pain:      Sitting, at rest: 2/10  Standing, at rest: 4/10  After walking 100 ft: 6/10  Location: R knee/R ITB/R buttocks        Activity Tolerance:   Good-improving  Please refer to the flowsheet for vital signs taken during this treatment.   After treatment:   [x]     Patient left in no apparent distress sitting up in chair  []     Patient left in no apparent distress in bed  [x]     Call bell left within reach  [x]     Nursing notified  [x]     Caregiver present  []     Bed alarm activated    COMMUNICATION/COLLABORATION:   The patient???s plan of care was discussed with: Registered Nurse, Social Worker and Certified Nursing Assistant/Patient Care Technician    Billey GoslingLaura T Keziah Avis   Time Calculation: 30 mins

## 2018-02-26 NOTE — Progress Notes (Addendum)
Chart reviewed. CM spoke with Neoma Laming with [facility] [phone]). They are evaluating patient. They are requesting therapy updates for today. CM notified therapy team. Family is requesting [place] location. CM will follow.    3:00 PM: CM spoke with Neoma Laming with [facility]. They have denied this patient as they feel she is too high level. CM met with patient and wife at bedside to notify. The patient does not want to try for another acute rehab. She does not qualify for SNF as she is observation status.    Patient's wife stated that they have lots of friends who are retired and they will pull together to provide 24/7 care. They plan to rent a hospital bed and a temporary ramp. Care management will arrange for home health care. Patient preference is [facility]. CRM will need specific home care orders in order to arrange home health. CM left FYI for MD.    RN spoke with MD. Noted home care orders and sent referral to [facility] via [employer]. They have accepted patient.     Dr. Fanny Skates plans to discharge the patient today. Wife will transport patient home via car. Patient has a walker in the room and has ordered a hospital bed to be delivered Monday. Patient plans to sleep in her recliner until the bed is delivered.  RN is aware.    Regenia Skeeter, BSW/CRM

## 2018-02-26 NOTE — Progress Notes (Signed)
Bedside shift change report given to Angie (oncoming nurse) by Cody (offgoing nurse). Report included the following information SBAR, Kardex, Intake/Output, MAR and Recent Results.

## 2018-02-26 NOTE — Progress Notes (Signed)
Problem: Self Care Deficits Care Plan (Adult)  Goal: *Acute Goals and Plan of Care (Insert Text)  Occupational Therapy Goals  Initiated 02/24/2018  1.  Patient will perform lower body ADLs with AE supervision/set-up within 7 day(s).  2.  Patient will perform upper body ADLs standing 5 mins without fatigue or LOB with supervision/set-up within 7 day(s).  3.  Patient will perform toilet transfer with supervision/set-up within 7 day(s).  4.  Patient will perform all aspects of toileting with supervision/set-up within 7 day(s).  5.  Patient will participate in upper extremity therapeutic exercise/activities with supervision/set-up for 10 minutes within 7 day(s).    6.  Patient will utilize energy conservation techniques during functional activities without cues within 7 day(s).   Occupational Therapy TREATMENT  Patient: Kelsey [name] (67 y.o. female)  Date: 02/26/2018  Diagnosis: Hip pain [M25.559] <principal problem not specified>      Precautions: Fall  Chart, occupational therapy assessment, plan of care, and goals were reviewed.    ASSESSMENT:  Pt was able to complete dressing and bathing activities while sitting in bathroom.  She is now verbalizing appropriate safety awareness verbalizing to use the call bell to ring for assistance.  Pt was able to complete bathing with CG from sitting on chair at sink, lower body dressing with supervision using cross leg technique, and toileting activities with CG.  Pt demonstrating improving cognition as noted by appropriately answering home safety scenario questions for therapist.   If pt were to discharge home, she will require assistance for IADL activities and supervision to ensure safety.  Pt would benefit from discharge to rehab setting for further IADL training and education.   Progression toward goals:  [x]        Improving appropriately and progressing toward goals  []        Improving slowly and progressing toward goals   []        Not making progress toward goals and plan of care will be adjusted     PLAN:  Patient continues to benefit from skilled intervention to address the above impairments.  Continue treatment per established plan of care.  Discharge Recommendations:  Rehab and Home Health  Further Equipment Recommendations for Discharge:  TBD     SUBJECTIVE:   Patient stated ???I am feeling alright.???    OBJECTIVE DATA SUMMARY:   Cognitive/Behavioral Status:  Neurologic State: Alert  Orientation Level: Oriented to person;Oriented to place;Oriented to situation  Cognition: Appropriate decision making;Appropriate for age attention/concentration  Perception: Appears intact  Perseveration: No perseveration noted  Safety/Judgement: Decreased awareness of environment;Decreased awareness of need for assistance;Decreased awareness of need for safety  Functional Mobility and Transfers for ADLs:Bed Mobility:       Transfers:     Functional Transfers  Bathroom Mobility: Supervision/set up  Toilet Transfer : Supervision       Balance:  Sitting: Intact  Standing: Intact;With support  Standing - Static: Fair  Standing - Dynamic : Fair  ADL Intervention:       Grooming  Grooming Assistance: Supervision/set up  Washing Face: Supervision/set-up  Washing Hands: Supervision/set-up  Cues: Verbal cues provided    Upper Body Bathing  Bathing Assistance: Supervision/set-up    Lower Body Bathing  Bathing Assistance: Minimum assistance  Perineal  : Contact guard assistance  Cues: Verbal cues provided  Lower Body : Contact guard assistance    Upper Body Dressing Assistance  Dressing Assistance: Supervision/set-up  Bra: Supervision/set-up  Pullover Shirt: Supervision/set-up    Lower Body Dressing  Assistance  Dressing Assistance: Supervision/set up  Underpants: Supervision/set-up  Socks: Supervision/set-up  Slip on Shoes with Back: Supervision/set-up  Leg Crossed Method Used: Yes  Position Performed: Seated in chair  Cues: Verbal cues provided          Cognitive Retraining  Safety/Judgement: Decreased awareness of environment;Decreased awareness of need for assistance;Decreased awareness of need for safety  Pain:  Pain Scale 1: Numeric (0 - 10)  Pain Intensity 1: 0              Activity Tolerance:   VSS throughout session.     After treatment:   [x]  Patient left in no apparent distress sitting up in chair  []  Patient left in no apparent distress in bed  [x]  Call bell left within reach  [x]  Nursing notified  []  Caregiver present  [x]  Bed alarm activated    COMMUNICATION/COLLABORATION:   The patient???s plan of care was discussed with: Physical Therapist and Registered Nurse    Santa LighterSuzanne M Pegram, OT  Time Calculation: 30 mins

## 2018-02-26 NOTE — Progress Notes (Signed)
Bedside and Verbal shift change report given to Jake (Cabin crewoncoming nurse) by Karoline CaldwellAngie (offgoing nurse). Report included the following information SBAR, Kardex, Procedure Summary and Intake/Output.

## 2018-03-05 NOTE — Telephone Encounter (Signed)
Ms. Kelsey [name] from [facility] stated that the Pt would like to be discharged from [facility] so that she can do out Pt therapy    Ph# [phone]       She was told that the nurse will not get the message until Monday

## 2018-03-08 MED ORDER — LIDOCAINE 5 % (700 MG/PATCH) ADHESIVE PATCH
5 % | CUTANEOUS | 2 refills | Status: AC
Start: 2018-03-08 — End: ?

## 2018-03-08 NOTE — Telephone Encounter (Signed)
Is patient able to drive?

## 2018-03-09 ENCOUNTER — Encounter

## 2018-03-10 MED ORDER — LEVEMIR FLEXTOUCH U-100 INSULIN 100 UNIT/ML (3 ML) SUBCUTANEOUS PEN
100 unit/mL (3 mL) | SUBCUTANEOUS | 1 refills | Status: DC
Start: 2018-03-10 — End: 2018-12-04

## 2018-03-12 NOTE — Progress Notes (Signed)
Home health orders reviewed and signed

## 2018-04-09 ENCOUNTER — Encounter

## 2018-04-09 MED ORDER — NABUMETONE 500 MG TAB
500 mg | ORAL_TABLET | ORAL | 0 refills | Status: DC
Start: 2018-04-09 — End: 2018-06-23

## 2018-04-18 MED ORDER — CHANTIX CONTINUING MONTH BOX 1 MG TABLET
1 mg | ORAL_TABLET | ORAL | 3 refills | Status: DC
Start: 2018-04-18 — End: 2018-08-19

## 2018-04-20 MED ORDER — PANTOPRAZOLE 40 MG TAB, DELAYED RELEASE
40 mg | ORAL_TABLET | ORAL | 0 refills | Status: DC
Start: 2018-04-20 — End: 2018-07-23

## 2018-04-21 ENCOUNTER — Ambulatory Visit: Admit: 2018-04-21 | Attending: Internal Medicine | Primary: Internal Medicine

## 2018-04-21 DIAGNOSIS — I152 Hypertension secondary to endocrine disorders: Secondary | ICD-10-CM

## 2018-04-21 LAB — AMB POC LIPID PROFILE
Cholesterol (POC): 132 mg/dL (ref 100–199)
HDL Cholesterol (POC): 69 mg/dL (ref 35–150)
LDL Cholesterol (POC): 31 mg/dL (ref 0–129)
Non-HDL Goal (POC): 63
TChol/HDL Ratio (POC): 1.9 CALC (ref 0.0–5.0)
Triglycerides (POC): 161 mg/dL — AB (ref 0–150)

## 2018-04-21 LAB — AMB POC HEMOGLOBIN A1C: Hemoglobin A1c (POC): 6.3 % — AB (ref 4.8–5.6)

## 2018-04-21 MED ORDER — EZETIMIBE 10 MG TAB
10 mg | ORAL_TABLET | ORAL | 3 refills | Status: DC
Start: 2018-04-21 — End: 2019-03-31

## 2018-04-21 NOTE — Patient Instructions (Signed)
Results for orders placed or performed in visit on 04/21/18   AMB POC HEMOGLOBIN A1C   Result Value Ref Range    Hemoglobin A1c (POC) 6.3 (A) 4.8 - 5.6 %   AMB POC LIPID PROFILE   Result Value Ref Range    Cholesterol (POC) 132 100 - 199 mg/dL    Triglycerides (POC) 161 (A) 0 - 150 mg/dL    HDL Cholesterol (POC) 69 35 - 150 mg/dL    LDL Cholesterol (POC) 31 0 - 129 mg/dL    Non-HDL Goal (POC) 63     TChol/HDL Ratio (POC) 1.9 0.0 - 5.0 CALC

## 2018-04-21 NOTE — Progress Notes (Signed)
Kelsey [name] is a 67 y.o. female and presents with   Chief Complaint   Patient presents with   ??? Follow-up   .  Feeling ok,  Taking relafen just in the am.  Hip continues to hurt.  Sees ortho next week. Patient c/o she does not empty her bladder completely. Patient did not bring meds.  - says nothing has changed.      Current Outpatient Medications   Medication Sig Dispense Refill   ??? ezetimibe (ZETIA) 10 mg tablet TAKE 1 TAB BY MOUTH NIGHTLY. 90 Tab 3   ??? pantoprazole (PROTONIX) 40 mg tablet TAKE 1 TABLET BY MOUTH DAILY 90 Tab 0   ??? CHANTIX CONTINUING MONTH BOX 1 mg tablet TAKE 1 TABLET BY MOUTH TWICE A DAY 56 Tab 3   ??? nabumetone (RELAFEN) 500 mg tablet TAKE 1 TAB BY MOUTH TWO (2) TIMES A DAY. 60 Tab 0   ??? LEVEMIR FLEXTOUCH U-100 INSULN 100 unit/mL (3 mL) inpn INJECT 34 UNITS UNDER THE SKIN NIGHTLY AT BEDTIME 45 Adjustable Dose Pre-filled Pen Syringe 1   ??? lidocaine (LIDODERM) 5 % Apply patch to the affected area for 12 hours a day and remove for 12 hours a day. 10 Each 2   ??? carvedilol (COREG) 25 mg tablet Take 25 mg by mouth two (2) times daily (with meals).     ??? metFORMIN ER (GLUCOPHAGE XR) 500 mg tablet Take 1,000 mg by mouth two (2) times daily (after meals).     ??? nabumetone (RELAFEN) 500 mg tablet Take 500 mg by mouth two (2) times daily as needed for Pain.     ??? losartan (COZAAR) 25 mg tablet TAKE 1 TAB BY MOUTH DAILY. 30 Tab 3   ??? clopidogrel (PLAVIX) 75 mg tab TAKE 1 TABLET BY MOUTH DAILY 30 Tab 5   ??? magnesium oxide (MAG-OX) 400 mg tablet Take 1 Tab by mouth daily. 90 Tab 3   ??? DULoxetine (CYMBALTA) 60 mg capsule TAKE 1 CAPSULE BY MOUTH DAILY 90 Cap 2   ??? insulin lispro (HUMALOG U-100 INSULIN) 100 unit/mL injection by SubCUTAneous route. 0-8 units three times a day as needed     ??? VESICARE 10 mg tablet TAKE 1 TABLET BY MOUTH DAILY. 90 Tab 4   ??? VITAMIN D3 2,000 unit cap capsule TAKE ONE CAPSULE BY MOUTH DAILY 90 Cap 3   ??? aspirin delayed-release 81 mg tablet TAKE 1 TAB BY MOUTH DAILY. 90 Tab 3    ??? gabapentin (NEURONTIN) 300 mg capsule Take 1,200 mg by mouth daily.     ??? acetaminophen (ACETAMINOPHEN EXTRA STRENGTH) 500 mg tablet Take 1,000 mg by mouth every six (6) hours. With tramadol     ??? nitroglycerin (NITROSTAT) 0.4 mg SL tablet 1 Tab by SubLINGual route every five (5) minutes as needed for Chest Pain for up to 3 doses. 1 Bottle 1   ??? bumetanide (BUMEX) 2 mg tablet Take 2 mg by mouth daily.       Allergies   Allergen Reactions   ??? Morphine Anaphylaxis     Tolerates oxycodone without problem   ??? Avalide [Irbesartan-Hydrochlorothiazide] Myalgia   ??? Betadine [Povidone-Iodine] Rash   ??? Demerol [Meperidine] Other (comments)     Makes her feel crazy   ??? Pcn [Penicillins] Swelling   ??? Prinivil [Lisinopril] Cough   ??? Statins-Hmg-Coa Reductase Inhibitors Myalgia   ??? Sulfa (Sulfonamide Antibiotics) Unknown (comments)   ??? Tricor [Fenofibrate Micronized] Myalgia     Past Medical  History:   Diagnosis Date   ??? Acute MI (HCC)     x3   ??? Arthritis    ??? CAD (coronary artery disease)    ??? Cervical spondylolysis    ??? Chronic pain    ??? COPD    ??? Diabetes (HCC)    ??? GERD (gastroesophageal reflux disease)    ??? Goiter 04/20/2012   ??? Hypertension    ??? Neuropathy 04/06/2013     Past Surgical History:   Procedure Laterality Date   ??? 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 CARDIAC SURG PROCEDURE UNLIST      PTCA   ??? HX KNEE ARTHROSCOPY Right     X2   ??? HX OTHER SURGICAL  1970    GANGLION CYST   ??? HX TONSILLECTOMY  1963     Family History   Problem Relation Age of Onset   ??? Lung Disease Mother 5   ??? Stroke Father    ??? Lung Disease Brother 44        COPD   ??? Hypertension Brother    ??? Cancer Brother         PROSTATE   ??? Heart Disease Brother    ??? Diabetes Brother    ??? No Known Problems Paternal Grandmother    ??? Anesth Problems Neg Hx      Social History     Tobacco Use   ??? Smoking status: Former Smoker     Packs/day: 0.30     Years: 45.00     Pack years: 13.50     Types: Cigarettes   ??? Smokeless tobacco: Never Used    ??? Tobacco comment: quit around the first of November   Substance Use Topics   ??? Alcohol use: Yes     Frequency: Monthly or less     Drinks per session: 1 or 2     Binge frequency: Never     Comment: may be 3 times a year if that      The patient does not have a history of falls. A plan of care for falls was documented..  Depression screen positive, treated.    Objective:  Visit Vitals  BP 132/70   Ht  (1.626 m)   Wt 195 lb (88.5 kg)   BMI 33.47 kg/m??     wdwn 67 yo wf  In NAD. A&O.  HEENT -- Pupils round.  O/P Clear.  Neck -- Supple. No JVD.  Heart -- RRR. .  Lungs -- CTA.  Abdomen -- Soft. Non-tender. Non-distended. No masses. Bowel sounds present.  Extremities -- No edema.  Poor pulses,  No lesions      Results for orders placed or performed in visit on 04/21/18   AMB POC HEMOGLOBIN A1C   Result Value Ref Range    Hemoglobin A1c (POC) 6.3 (A) 4.8 - 5.6 %   AMB POC LIPID PROFILE   Result Value Ref Range    Cholesterol (POC) 132 100 - 199 mg/dL    Triglycerides (POC) 161 (A) 0 - 150 mg/dL    HDL Cholesterol (POC) 69 35 - 150 mg/dL    LDL Cholesterol (POC) 31 0 - 129 mg/dL    Non-HDL Goal (POC) 63     TChol/HDL Ratio (POC) 1.9 0.0 - 5.0 CALC       Assessment/Plan:    ICD-10-CM ICD-9-CM    1. Hypertension complicating diabetes (HCC) E11.59 250.80 MICROALBUMIN, UR, RAND W/ MICROALB/CREAT  RATIO    I10 401.9    2. Atherosclerosis of aorta (HCC) I70.0 440.0    3. 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 Long term (current) use of insulin (HCC) Z79.4 V58.67    5. Severe obesity with body mass index (BMI) of 35.0 to 39.9 with serious comorbidity (HCC) E66.01 278.01    6. Type 2 diabetes mellitus with diabetic neuropathy, with long-term current use of insulin (HCC) E11.40 250.60     Z79.4 357.2      V58.67    7. Neuropathy G62.9 355.9     8. Mixed hyperlipidemia E78.2 272.2 AMB POC LIPID PROFILE   9. Urinary retention R33.9 788.20 REFERRAL TO UROLOGY     Follow up physical      Author:  Lake Bells, MD 04/21/2018 10:52 AM

## 2018-04-21 NOTE — Progress Notes (Signed)
pls call- labs stable.  Continue working on diet and exercise.  Follow up for your physical

## 2018-04-21 NOTE — Progress Notes (Signed)
Called and left message.

## 2018-04-22 LAB — METABOLIC PANEL, COMPREHENSIVE
A-G Ratio: 2.2 (ref 1.2–2.2)
ALT (SGPT): 14 IU/L (ref 0–32)
AST (SGOT): 12 IU/L (ref 0–40)
Albumin: 4.1 g/dL (ref 3.6–4.8)
Alk. phosphatase: 60 IU/L (ref 39–117)
BUN/Creatinine ratio: 17 (ref 12–28)
BUN: 18 mg/dL (ref 8–27)
Bilirubin, total: <0.2 mg/dL (ref 0.0–1.2)
CO2: 25 mmol/L (ref 20–29)
Calcium: 9.7 mg/dL (ref 8.7–10.3)
Chloride: 90 mmol/L — ABNORMAL LOW (ref 96–106)
Creatinine: 1.05 mg/dL — ABNORMAL HIGH (ref 0.57–1.00)
GFR est AA: 64 mL/min/{1.73_m2} (ref >59)
GFR est non-AA: 55 mL/min/{1.73_m2} — ABNORMAL LOW (ref >59)
GLOBULIN, TOTAL: 1.9 g/dL (ref 1.5–4.5)
Glucose: 140 mg/dL — ABNORMAL HIGH (ref 65–99)
Potassium: 4.6 mmol/L (ref 3.5–5.2)
Protein, total: 6 g/dL (ref 6.0–8.5)
Sodium: 134 mmol/L (ref 134–144)

## 2018-04-22 LAB — MICROALBUMIN, UR, RAND W/ MICROALB/CREAT RATIO
Creatinine, urine random: 66.9 mg/dL
Microalb/Creat ratio (ug/mg creat.): 130.3 mg/g creat — ABNORMAL HIGH (ref 0.0–30.0)
Microalbumin, urine: 87.2 ug/mL

## 2018-04-23 ENCOUNTER — Encounter

## 2018-04-23 MED ORDER — LOSARTAN 25 MG TAB
25 mg | ORAL_TABLET | ORAL | 3 refills | Status: DC
Start: 2018-04-23 — End: 2018-08-24

## 2018-05-04 ENCOUNTER — Encounter: Attending: Internal Medicine | Primary: Internal Medicine

## 2018-05-10 MED ORDER — METFORMIN SR 500 MG 24 HR TABLET
500 mg | ORAL_TABLET | ORAL | 3 refills | Status: DC
Start: 2018-05-10 — End: 2019-03-31

## 2018-05-26 ENCOUNTER — Ambulatory Visit: Admit: 2018-05-26 | Attending: Internal Medicine | Primary: Internal Medicine

## 2018-05-26 ENCOUNTER — Ambulatory Visit: Attending: Internal Medicine | Primary: Internal Medicine

## 2018-05-26 DIAGNOSIS — K5904 Chronic idiopathic constipation: Secondary | ICD-10-CM

## 2018-05-26 NOTE — Progress Notes (Signed)
Kelsey [name] is a 67 y.o. female and presents with   Chief Complaint   Patient presents with   ??? Other     constipation   .  Patient has not had a bm for the last 3 weeks.  Has seen doc in the box, - started on mag citrate and liquid diet.- nothing  Tried miralax- nothing,  .  Then went to ER- did Ct scan - negative.  and given lactulose and dicyclomine. Had very small bm.  Thinks she is drinking plenty of water.  Had been on vacation.  Went to massage therapist who rubbed her belly for an hour- no effect.  No weight loss, abd pain, cramping.  Blood sugars ok, denies chest pain or sob.  Feels weak on liquid diet.      Current Outpatient Medications   Medication Sig Dispense Refill   ??? metFORMIN ER (GLUCOPHAGE XR) 500 mg tablet TAKE 2 TABLETS BY MOUTH IN THE MORNING WITH BREAKFAST AND 3 TABLETS BY MOUTH AT BEDTIME 450 Tab 3   ??? losartan (COZAAR) 25 mg tablet TAKE 1 TAB BY MOUTH DAILY. 30 Tab 3   ??? ezetimibe (ZETIA) 10 mg tablet TAKE 1 TAB BY MOUTH NIGHTLY. 90 Tab 3   ??? pantoprazole (PROTONIX) 40 mg tablet TAKE 1 TABLET BY MOUTH DAILY 90 Tab 0   ??? CHANTIX CONTINUING MONTH BOX 1 mg tablet TAKE 1 TABLET BY MOUTH TWICE A DAY 56 Tab 3   ??? nabumetone (RELAFEN) 500 mg tablet TAKE 1 TAB BY MOUTH TWO (2) TIMES A DAY. 60 Tab 0   ??? LEVEMIR FLEXTOUCH U-100 INSULN 100 unit/mL (3 mL) inpn INJECT 34 UNITS UNDER THE SKIN NIGHTLY AT BEDTIME 45 Adjustable Dose Pre-filled Pen Syringe 1   ??? lidocaine (LIDODERM) 5 % Apply patch to the affected area for 12 hours a day and remove for 12 hours a day. 10 Each 2   ??? carvedilol (COREG) 25 mg tablet Take 25 mg by mouth two (2) times daily (with meals).     ??? nabumetone (RELAFEN) 500 mg tablet Take 500 mg by mouth two (2) times daily as needed for Pain.     ??? clopidogrel (PLAVIX) 75 mg tab TAKE 1 TABLET BY MOUTH DAILY 30 Tab 5   ??? magnesium oxide (MAG-OX) 400 mg tablet Take 1 Tab by mouth daily. 90 Tab 3   ??? DULoxetine (CYMBALTA) 60 mg capsule TAKE 1 CAPSULE BY MOUTH DAILY 90 Cap 2   ??? insulin  lispro (HUMALOG U-100 INSULIN) 100 unit/mL injection by SubCUTAneous route. 0-8 units three times a day as needed     ??? VESICARE 10 mg tablet TAKE 1 TABLET BY MOUTH DAILY. 90 Tab 4   ??? VITAMIN D3 2,000 unit cap capsule TAKE ONE CAPSULE BY MOUTH DAILY 90 Cap 3   ??? aspirin delayed-release 81 mg tablet TAKE 1 TAB BY MOUTH DAILY. 90 Tab 3   ??? gabapentin (NEURONTIN) 300 mg capsule Take 1,200 mg by mouth daily.     ??? acetaminophen (ACETAMINOPHEN EXTRA STRENGTH) 500 mg tablet Take 1,000 mg by mouth every six (6) hours. With tramadol     ??? nitroglycerin (NITROSTAT) 0.4 mg SL tablet 1 Tab by SubLINGual route every five (5) minutes as needed for Chest Pain for up to 3 doses. 1 Bottle 1   ??? bumetanide (BUMEX) 2 mg tablet Take 2 mg by mouth daily.       Allergies   Allergen Reactions   ??? Morphine Anaphylaxis  Tolerates oxycodone without problem   ??? Avalide [Irbesartan-Hydrochlorothiazide] Myalgia   ??? Betadine [Povidone-Iodine] Rash   ??? Demerol [Meperidine] Other (comments)     Makes her feel crazy   ??? Pcn [Penicillins] Swelling   ??? Prinivil [Lisinopril] Cough   ??? Statins-Hmg-Coa Reductase Inhibitors Myalgia   ??? Sulfa (Sulfonamide Antibiotics) Unknown (comments)   ??? Tricor [Fenofibrate Micronized] Myalgia     Past Medical History:   Diagnosis Date   ??? Acute MI (HCC)     x3   ??? Arthritis    ??? CAD (coronary artery disease)    ??? Cervical spondylolysis    ??? Chronic pain    ??? COPD    ??? Diabetes (HCC)    ??? GERD (gastroesophageal reflux disease)    ??? Goiter 04/20/2012   ??? Hypertension    ??? Neuropathy 04/06/2013     Past Surgical History:   Procedure Laterality Date   ??? 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 CARDIAC SURG PROCEDURE UNLIST      PTCA   ??? HX KNEE ARTHROSCOPY Right     X2   ??? HX OTHER SURGICAL  1970    GANGLION CYST   ??? HX TONSILLECTOMY  1963     Family History   Problem Relation Age of Onset   ??? Lung Disease Mother 46   ??? Stroke Father    ??? Lung Disease Brother 52        COPD   ??? Hypertension Brother    ??? Cancer  Brother         PROSTATE   ??? Heart Disease Brother    ??? Diabetes Brother    ??? No Known Problems Paternal Grandmother    ??? Anesth Problems Neg Hx      Social History     Tobacco Use   ??? Smoking status: Former Smoker     Packs/day: 0.30     Years: 45.00     Pack years: 13.50     Types: Cigarettes   ??? Smokeless tobacco: Never Used   ??? Tobacco comment: quit around the first of November   Substance Use Topics   ??? Alcohol use: Yes     Frequency: Monthly or less     Drinks per session: 1 or 2     Binge frequency: Never     Comment: may be 3 times a year if that            Objective:  Visit Vitals  BP 110/60   Ht 5\' 4"  (1.626 m)   Wt 191 lb (86.6 kg)   BMI 32.79 kg/m??     Heavy 67 yo wf  In NAD. A&O.  HEENT -- Pupils round.  O/P Clear.  Neck -- Supple. No JVD.  Heart -- RRR. No R/M/G.  Lungs -- CTA.  Abdomen -- Soft. Non-tender. Non-distended. No masses. Bowel sounds present.  Rectal- no stool in vault.  No masses  Extremities -- No edema.        Assessment/Plan:    ICD-10-CM ICD-9-CM    1. Chronic idiopathic constipation K59.04 564.00    2. 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 Atherosclerosis of aorta (HCC) I70.0 440.0    4. Hypertension complicating diabetes (HCC) E11.59 250.80     I10 401.9    5. Uncontrolled type 2 diabetes mellitus with hyperglycemia (HCC) E11.65 250.02    6. 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 Type 2 diabetes mellitus with diabetic neuropathy, with long-term current use of insulin (HCC) E11.40 250.60     Z79.4 357.2      V58.67    8. Long term (current) use of insulin (HCC) Z79.4 V58.67    9. Severe obesity with body mass index (BMI) of 35.0 to 39.9 with serious comorbidity (HCC) E66.01 278.01      Given linzess 145 mg samples and 290 mg.  if 145 does not work increase to 290.  If linzess not effective, call in golytely    Author:  Lake BellsNancy J Wadsworth Skolnick, MD 05/26/2018 11:35 AM

## 2018-05-26 NOTE — Progress Notes (Signed)
Kelsey [name] is a 67 y.o. female and presents with   Chief Complaint   Patient presents with   ??? Other     constipation   .  Patient has not had a bm for the last 3 weeks.  Has seen doc in the box, - started on mag citrate and liquid diet.- nothing  Tried miralax- nothing,  .  Then went to ER- did Ct scan - negative.  and given lactulose and dicyclomine. Had very small bm.  Thinks she is drinking plenty of water.  Had been on vacation.  Went to massage therapist who rubbed her belly for an hour- no effect.  No weight loss, abd pain, cramping.  Blood sugars ok, denies chest pain or sob.  Feels weak on liquid diet.      Current Outpatient Medications   Medication Sig Dispense Refill   ??? metFORMIN ER (GLUCOPHAGE XR) 500 mg tablet TAKE 2 TABLETS BY MOUTH IN THE MORNING WITH BREAKFAST AND 3 TABLETS BY MOUTH AT BEDTIME 450 Tab 3   ??? losartan (COZAAR) 25 mg tablet TAKE 1 TAB BY MOUTH DAILY. 30 Tab 3   ??? ezetimibe (ZETIA) 10 mg tablet TAKE 1 TAB BY MOUTH NIGHTLY. 90 Tab 3   ??? pantoprazole (PROTONIX) 40 mg tablet TAKE 1 TABLET BY MOUTH DAILY 90 Tab 0   ??? CHANTIX CONTINUING MONTH BOX 1 mg tablet TAKE 1 TABLET BY MOUTH TWICE A DAY 56 Tab 3   ??? nabumetone (RELAFEN) 500 mg tablet TAKE 1 TAB BY MOUTH TWO (2) TIMES A DAY. 60 Tab 0   ??? LEVEMIR FLEXTOUCH U-100 INSULN 100 unit/mL (3 mL) inpn INJECT 34 UNITS UNDER THE SKIN NIGHTLY AT BEDTIME 45 Adjustable Dose Pre-filled Pen Syringe 1   ??? lidocaine (LIDODERM) 5 % Apply patch to the affected area for 12 hours a day and remove for 12 hours a day. 10 Each 2   ??? carvedilol (COREG) 25 mg tablet Take 25 mg by mouth two (2) times daily (with meals).     ??? nabumetone (RELAFEN) 500 mg tablet Take 500 mg by mouth two (2) times daily as needed for Pain.     ??? clopidogrel (PLAVIX) 75 mg tab TAKE 1 TABLET BY MOUTH DAILY 30 Tab 5   ??? magnesium oxide (MAG-OX) 400 mg tablet Take 1 Tab by mouth daily. 90 Tab 3   ??? DULoxetine (CYMBALTA) 60 mg capsule TAKE 1 CAPSULE BY MOUTH DAILY 90 Cap 2    ??? insulin lispro (HUMALOG U-100 INSULIN) 100 unit/mL injection by SubCUTAneous route. 0-8 units three times a day as needed     ??? VESICARE 10 mg tablet TAKE 1 TABLET BY MOUTH DAILY. 90 Tab 4   ??? VITAMIN D3 2,000 unit cap capsule TAKE ONE CAPSULE BY MOUTH DAILY 90 Cap 3   ??? aspirin delayed-release 81 mg tablet TAKE 1 TAB BY MOUTH DAILY. 90 Tab 3   ??? gabapentin (NEURONTIN) 300 mg capsule Take 1,200 mg by mouth daily.     ??? acetaminophen (ACETAMINOPHEN EXTRA STRENGTH) 500 mg tablet Take 1,000 mg by mouth every six (6) hours. With tramadol     ??? nitroglycerin (NITROSTAT) 0.4 mg SL tablet 1 Tab by SubLINGual route every five (5) minutes as needed for Chest Pain for up to 3 doses. 1 Bottle 1   ??? bumetanide (BUMEX) 2 mg tablet Take 2 mg by mouth daily.       Allergies   Allergen Reactions   ??? Morphine Anaphylaxis  Tolerates oxycodone without problem   ??? Avalide [Irbesartan-Hydrochlorothiazide] Myalgia   ??? Betadine [Povidone-Iodine] Rash   ??? Demerol [Meperidine] Other (comments)     Makes her feel crazy   ??? Pcn [Penicillins] Swelling   ??? Prinivil [Lisinopril] Cough   ??? Statins-Hmg-Coa Reductase Inhibitors Myalgia   ??? Sulfa (Sulfonamide Antibiotics) Unknown (comments)   ??? Tricor [Fenofibrate Micronized] Myalgia     Past Medical History:   Diagnosis Date   ??? Acute MI (HCC)     x3   ??? Arthritis    ??? CAD (coronary artery disease)    ??? Cervical spondylolysis    ??? Chronic pain    ??? COPD    ??? Diabetes (HCC)    ??? GERD (gastroesophageal reflux disease)    ??? Goiter 04/20/2012   ??? Hypertension    ??? Neuropathy 04/06/2013     Past Surgical History:   Procedure Laterality Date   ??? 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 CARDIAC SURG PROCEDURE UNLIST      PTCA   ??? HX KNEE ARTHROSCOPY Right     X2   ??? HX OTHER SURGICAL  1970    GANGLION CYST   ??? HX TONSILLECTOMY  1963     Family History   Problem Relation Age of Onset   ??? Lung Disease Mother 85   ??? Stroke Father    ??? Lung Disease Brother 34        COPD   ??? Hypertension Brother     ??? Cancer Brother         PROSTATE   ??? Heart Disease Brother    ??? Diabetes Brother    ??? No Known Problems Paternal Grandmother    ??? Anesth Problems Neg Hx      Social History     Tobacco Use   ??? Smoking status: Former Smoker     Packs/day: 0.30     Years: 45.00     Pack years: 13.50     Types: Cigarettes   ??? Smokeless tobacco: Never Used   ??? Tobacco comment: quit around the first of November   Substance Use Topics   ??? Alcohol use: Yes     Frequency: Monthly or less     Drinks per session: 1 or 2     Binge frequency: Never     Comment: may be 3 times a year if that            Objective:  Visit Vitals  BP 110/60   Ht 5\' 4"  (1.626 m)   Wt 191 lb (86.6 kg)   BMI 32.79 kg/m??     Heavy 67 yo wf  In NAD. A&O.  HEENT -- Pupils round.  O/P Clear.  Neck -- Supple. No JVD.  Heart -- RRR. No R/M/G.  Lungs -- CTA.  Abdomen -- Soft. Non-tender. Non-distended. No masses. Bowel sounds present.  Rectal- no stool in vault.  No masses  Extremities -- No edema.        Assessment/Plan:    ICD-10-CM ICD-9-CM    1. Chronic idiopathic constipation K59.04 564.00    2. 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 Atherosclerosis of aorta (HCC) I70.0 440.0    4. Hypertension complicating diabetes (HCC) E11.59 250.80     I10 401.9    5. Uncontrolled type 2 diabetes mellitus with hyperglycemia (HCC) E11.65 250.02    6. 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 Type 2 diabetes mellitus with diabetic neuropathy, with long-term current use of insulin (HCC) E11.40 250.60     Z79.4 357.2      V58.67    8. Long term (current) use of insulin (HCC) Z79.4 V58.67    9. Severe obesity with body mass index (BMI) of 35.0 to 39.9 with serious comorbidity (HCC) E66.01 278.01      Given linzess 145 mg samples and 290 mg.  if 145 does not work increase to 290.  If linzess not effective, call in golytely    Author:  Lake BellsNancy J Wadsworth Skolnick, MD 05/26/2018 11:35 AM

## 2018-05-27 MED ORDER — CLOPIDOGREL 75 MG TAB
75 mg | ORAL_TABLET | ORAL | 5 refills | Status: DC
Start: 2018-05-27 — End: 2018-11-20

## 2018-05-28 MED ORDER — PEG 3350-ELECTROLYTES 236 G-22.74 G-6.74 G-5.86 G ORAL SOLUTION
ORAL | 0 refills | Status: AC
Start: 2018-05-28 — End: 2018-05-28

## 2018-05-28 NOTE — Telephone Encounter (Signed)
I sent it in.

## 2018-05-28 NOTE — Telephone Encounter (Signed)
Pt was calling to inform the Dr that the medication she was prescribed did not work, she would like a prescription for GoLytely called into the Pharm please    Ph# [phone]  [facility] Ph# [phone]

## 2018-05-31 NOTE — Telephone Encounter (Signed)
I sent golytely into the pharmacy

## 2018-06-04 ENCOUNTER — Ambulatory Visit: Admit: 2018-06-04 | Attending: Internal Medicine | Primary: Internal Medicine

## 2018-06-04 DIAGNOSIS — K5909 Other constipation: Secondary | ICD-10-CM

## 2018-06-04 NOTE — Progress Notes (Signed)
Kelsey [name] is a 66 y.o. female and presents with   Chief Complaint   Patient presents with   ??? Other     constipation   .  Patient had no response to linzess.  And did have a couple bm from golytely.  Then "pooped brown water for days"..  Then took miralax for a few days and did not have any results.  Has had lower abd pain since then.  Still not eating much.  No fever or weight loss      Current Outpatient Medications   Medication Sig Dispense Refill   ??? clopidogrel (PLAVIX) 75 mg tab TAKE 1 TABLET BY MOUTH DAILY 30 Tab 5   ??? metFORMIN ER (GLUCOPHAGE XR) 500 mg tablet TAKE 2 TABLETS BY MOUTH IN THE MORNING WITH BREAKFAST AND 3 TABLETS BY MOUTH AT BEDTIME 450 Tab 3   ??? losartan (COZAAR) 25 mg tablet TAKE 1 TAB BY MOUTH DAILY. 30 Tab 3   ??? ezetimibe (ZETIA) 10 mg tablet TAKE 1 TAB BY MOUTH NIGHTLY. 90 Tab 3   ??? pantoprazole (PROTONIX) 40 mg tablet TAKE 1 TABLET BY MOUTH DAILY 90 Tab 0   ??? CHANTIX CONTINUING MONTH BOX 1 mg tablet TAKE 1 TABLET BY MOUTH TWICE A DAY 56 Tab 3   ??? nabumetone (RELAFEN) 500 mg tablet TAKE 1 TAB BY MOUTH TWO (2) TIMES A DAY. 60 Tab 0   ??? LEVEMIR FLEXTOUCH U-100 INSULN 100 unit/mL (3 mL) inpn INJECT 34 UNITS UNDER THE SKIN NIGHTLY AT BEDTIME 45 Adjustable Dose Pre-filled Pen Syringe 1   ??? lidocaine (LIDODERM) 5 % Apply patch to the affected area for 12 hours a day and remove for 12 hours a day. 10 Each 2   ??? carvedilol (COREG) 25 mg tablet Take 25 mg by mouth two (2) times daily (with meals).     ??? nabumetone (RELAFEN) 500 mg tablet Take 500 mg by mouth two (2) times daily as needed for Pain.     ??? magnesium oxide (MAG-OX) 400 mg tablet Take 1 Tab by mouth daily. 90 Tab 3   ??? DULoxetine (CYMBALTA) 60 mg capsule TAKE 1 CAPSULE BY MOUTH DAILY 90 Cap 2   ??? insulin lispro (HUMALOG U-100 INSULIN) 100 unit/mL injection by SubCUTAneous route. 0-8 units three times a day as needed     ??? VESICARE 10 mg tablet TAKE 1 TABLET BY MOUTH DAILY. 90 Tab 4   ??? VITAMIN D3 2,000 unit cap capsule TAKE ONE  CAPSULE BY MOUTH DAILY 90 Cap 3   ??? aspirin delayed-release 81 mg tablet TAKE 1 TAB BY MOUTH DAILY. 90 Tab 3   ??? gabapentin (NEURONTIN) 300 mg capsule Take 1,200 mg by mouth daily.     ??? acetaminophen (ACETAMINOPHEN EXTRA STRENGTH) 500 mg tablet Take 1,000 mg by mouth every six (6) hours. With tramadol     ??? nitroglycerin (NITROSTAT) 0.4 mg SL tablet 1 Tab by SubLINGual route every five (5) minutes as needed for Chest Pain for up to 3 doses. 1 Bottle 1   ??? bumetanide (BUMEX) 2 mg tablet Take 2 mg by mouth daily.       Allergies   Allergen Reactions   ??? Morphine Anaphylaxis     Tolerates oxycodone without problem   ??? Avalide [Irbesartan-Hydrochlorothiazide] Myalgia   ??? Betadine [Povidone-Iodine] Rash   ??? Demerol [Meperidine] Other (comments)     Makes her feel crazy   ??? Pcn [Penicillins] Swelling   ??? Prinivil [Lisinopril] Cough   ???  Statins-Hmg-Coa Reductase Inhibitors Myalgia   ??? Sulfa (Sulfonamide Antibiotics) Unknown (comments)   ??? Tricor [Fenofibrate Micronized] Myalgia     Past Medical History:   Diagnosis Date   ??? Acute MI (HCC)     x3   ??? Arthritis    ??? CAD (coronary artery disease)    ??? Cervical spondylolysis    ??? Chronic pain    ??? COPD    ??? Diabetes (HCC)    ??? GERD (gastroesophageal reflux disease)    ??? Goiter 04/20/2012   ??? Hypertension    ??? Neuropathy 04/06/2013     Past Surgical History:   Procedure Laterality Date   ??? 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 CARDIAC SURG PROCEDURE UNLIST      PTCA   ??? HX KNEE ARTHROSCOPY Right     X2   ??? HX OTHER SURGICAL  1970    GANGLION CYST   ??? HX TONSILLECTOMY  1963     Family History   Problem Relation Age of Onset   ??? Lung Disease Mother 3484   ??? Stroke Father    ??? Lung Disease Brother 2576        COPD   ??? Hypertension Brother    ??? Cancer Brother         PROSTATE   ??? Heart Disease Brother    ??? Diabetes Brother    ??? No Known Problems Paternal Grandmother    ??? Anesth Problems Neg Hx      Social History     Tobacco Use   ??? Smoking status: Former Smoker     Packs/day:  0.30     Years: 45.00     Pack years: 13.50     Types: Cigarettes   ??? Smokeless tobacco: Never Used   ??? Tobacco comment: quit around the first of November   Substance Use Topics   ??? Alcohol use: Yes     Frequency: Monthly or less     Drinks per session: 1 or 2     Binge frequency: Never     Comment: may be 3 times a year if that      T      Objective:  Visit Vitals  BP 160/80   Ht 5\' 4"  (1.626 m)   Wt 195 lb (88.5 kg)   BMI 33.47 kg/m??     wdwn 67 yo wf  In NAD. A&O.  HEENT -- Pupils round.  O/P Clear.  Neck -- Supple. No JVD.  Heart -- RRR. No R/M/G.  Lungs -- CTA.  Abdomen -- Soft. Non-tender. Non-distended. No masses. Bowel sounds present.  Rectal- no stool in rectal vault  Extremities -- No edema.        Assessment/Plan:    ICD-10-CM ICD-9-CM    1. Other constipation K59.09 564.09 XR ABD (KUB)   2. Hypertension complicating diabetes (HCC) E11.59 250.80     I10 401.9    3. Atherosclerosis of aorta (HCC) I70.0 440.0    4. Long term (current) use of insulin (HCC) Z79.4 V58.67    5. Severe obesity with body mass index (BMI) of 35.0 to 39.9 with serious comorbidity (HCC) E66.01 278.01      MOM , continue miralax, check xray to rule out obstruction      Author:  Lake BellsNancy J Dory Demont, MD 06/04/2018 8:41 AM

## 2018-06-04 NOTE — Progress Notes (Signed)
Kelsey [name] is a 67 y.o. female and presents with   Chief Complaint   Patient presents with   ??? Other     constipation   .  Patient had no response to linzess.  And did have a couple bm from golytely.  Then "pooped brown water for days"..  Then took miralax for a few days and did not have any results.  Has had lower abd pain since then.  Still not eating much.  No fever or weight loss      Current Outpatient Medications   Medication Sig Dispense Refill   ??? clopidogrel (PLAVIX) 75 mg tab TAKE 1 TABLET BY MOUTH DAILY 30 Tab 5   ??? metFORMIN ER (GLUCOPHAGE XR) 500 mg tablet TAKE 2 TABLETS BY MOUTH IN THE MORNING WITH BREAKFAST AND 3 TABLETS BY MOUTH AT BEDTIME 450 Tab 3   ??? losartan (COZAAR) 25 mg tablet TAKE 1 TAB BY MOUTH DAILY. 30 Tab 3   ??? ezetimibe (ZETIA) 10 mg tablet TAKE 1 TAB BY MOUTH NIGHTLY. 90 Tab 3   ??? pantoprazole (PROTONIX) 40 mg tablet TAKE 1 TABLET BY MOUTH DAILY 90 Tab 0   ??? CHANTIX CONTINUING MONTH BOX 1 mg tablet TAKE 1 TABLET BY MOUTH TWICE A DAY 56 Tab 3   ??? nabumetone (RELAFEN) 500 mg tablet TAKE 1 TAB BY MOUTH TWO (2) TIMES A DAY. 60 Tab 0   ??? LEVEMIR FLEXTOUCH U-100 INSULN 100 unit/mL (3 mL) inpn INJECT 34 UNITS UNDER THE SKIN NIGHTLY AT BEDTIME 45 Adjustable Dose Pre-filled Pen Syringe 1   ??? lidocaine (LIDODERM) 5 % Apply patch to the affected area for 12 hours a day and remove for 12 hours a day. 10 Each 2   ??? carvedilol (COREG) 25 mg tablet Take 25 mg by mouth two (2) times daily (with meals).     ??? nabumetone (RELAFEN) 500 mg tablet Take 500 mg by mouth two (2) times daily as needed for Pain.     ??? magnesium oxide (MAG-OX) 400 mg tablet Take 1 Tab by mouth daily. 90 Tab 3   ??? DULoxetine (CYMBALTA) 60 mg capsule TAKE 1 CAPSULE BY MOUTH DAILY 90 Cap 2   ??? insulin lispro (HUMALOG U-100 INSULIN) 100 unit/mL injection by SubCUTAneous route. 0-8 units three times a day as needed     ??? VESICARE 10 mg tablet TAKE 1 TABLET BY MOUTH DAILY. 90 Tab 4    ??? VITAMIN D3 2,000 unit cap capsule TAKE ONE CAPSULE BY MOUTH DAILY 90 Cap 3   ??? aspirin delayed-release 81 mg tablet TAKE 1 TAB BY MOUTH DAILY. 90 Tab 3   ??? gabapentin (NEURONTIN) 300 mg capsule Take 1,200 mg by mouth daily.     ??? acetaminophen (ACETAMINOPHEN EXTRA STRENGTH) 500 mg tablet Take 1,000 mg by mouth every six (6) hours. With tramadol     ??? nitroglycerin (NITROSTAT) 0.4 mg SL tablet 1 Tab by SubLINGual route every five (5) minutes as needed for Chest Pain for up to 3 doses. 1 Bottle 1   ??? bumetanide (BUMEX) 2 mg tablet Take 2 mg by mouth daily.       Allergies   Allergen Reactions   ??? Morphine Anaphylaxis     Tolerates oxycodone without problem   ??? Avalide [Irbesartan-Hydrochlorothiazide] Myalgia   ??? Betadine [Povidone-Iodine] Rash   ??? Demerol [Meperidine] Other (comments)     Makes her feel crazy   ??? Pcn [Penicillins] Swelling   ??? Prinivil [Lisinopril] Cough   ???  Statins-Hmg-Coa Reductase Inhibitors Myalgia   ??? Sulfa (Sulfonamide Antibiotics) Unknown (comments)   ??? Tricor [Fenofibrate Micronized] Myalgia     Past Medical History:   Diagnosis Date   ??? Acute MI (HCC)     x3   ??? Arthritis    ??? CAD (coronary artery disease)    ??? Cervical spondylolysis    ??? Chronic pain    ??? COPD    ??? Diabetes (HCC)    ??? GERD (gastroesophageal reflux disease)    ??? Goiter 04/20/2012   ??? Hypertension    ??? Neuropathy 04/06/2013     Past Surgical History:   Procedure Laterality Date   ??? 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 CARDIAC SURG PROCEDURE UNLIST      PTCA   ??? HX KNEE ARTHROSCOPY Right     X2   ??? HX OTHER SURGICAL  1970    GANGLION CYST   ??? HX TONSILLECTOMY  1963     Family History   Problem Relation Age of Onset   ??? Lung Disease Mother 3384   ??? Stroke Father    ??? Lung Disease Brother 6276        COPD   ??? Hypertension Brother    ??? Cancer Brother         PROSTATE   ??? Heart Disease Brother    ??? Diabetes Brother    ??? No Known Problems Paternal Grandmother    ??? Anesth Problems Neg Hx      Social History     Tobacco Use    ??? Smoking status: Former Smoker     Packs/day: 0.30     Years: 45.00     Pack years: 13.50     Types: Cigarettes   ??? Smokeless tobacco: Never Used   ??? Tobacco comment: quit around the first of November   Substance Use Topics   ??? Alcohol use: Yes     Frequency: Monthly or less     Drinks per session: 1 or 2     Binge frequency: Never     Comment: may be 3 times a year if that      T      Objective:  Visit Vitals  BP 160/80   Ht 5\' 4"  (1.626 m)   Wt 195 lb (88.5 kg)   BMI 33.47 kg/m??     wdwn 67 yo wf  In NAD. A&O.  HEENT -- Pupils round.  O/P Clear.  Neck -- Supple. No JVD.  Heart -- RRR. No R/M/G.  Lungs -- CTA.  Abdomen -- Soft. Non-tender. Non-distended. No masses. Bowel sounds present.  Rectal- no stool in rectal vault  Extremities -- No edema.        Assessment/Plan:    ICD-10-CM ICD-9-CM    1. Other constipation K59.09 564.09 XR ABD (KUB)   2. Hypertension complicating diabetes (HCC) E11.59 250.80     I10 401.9    3. Atherosclerosis of aorta (HCC) I70.0 440.0    4. Long term (current) use of insulin (HCC) Z79.4 V58.67    5. Severe obesity with body mass index (BMI) of 35.0 to 39.9 with serious comorbidity (HCC) E66.01 278.01      MOM , continue miralax, check xray to rule out obstruction      Author:  Lake BellsNancy J Shagun Wordell, MD 06/04/2018 8:41 AM

## 2018-06-15 ENCOUNTER — Ambulatory Visit: Admit: 2018-06-15 | Attending: Internal Medicine | Primary: Internal Medicine

## 2018-06-15 ENCOUNTER — Inpatient Hospital Stay: Admit: 2018-06-15 | Payer: MEDICARE | Attending: Internal Medicine | Primary: Internal Medicine

## 2018-06-15 ENCOUNTER — Ambulatory Visit: Attending: Internal Medicine | Primary: Internal Medicine

## 2018-06-15 DIAGNOSIS — K5900 Constipation, unspecified: Secondary | ICD-10-CM

## 2018-06-15 NOTE — Progress Notes (Signed)
Kelsey [name] is a 67 y.o. female and presents with   Chief Complaint   Patient presents with   ??? Constipation   .  [name] had some results with miralax and MOM then got constipated again.  Has some upper abd pain, has some nausea,  Has lost about 10 lbs in the last few months.      Current Outpatient Medications   Medication Sig Dispense Refill   ??? clopidogrel (PLAVIX) 75 mg tab TAKE 1 TABLET BY MOUTH DAILY 30 Tab 5   ??? metFORMIN ER (GLUCOPHAGE XR) 500 mg tablet TAKE 2 TABLETS BY MOUTH IN THE MORNING WITH BREAKFAST AND 3 TABLETS BY MOUTH AT BEDTIME 450 Tab 3   ??? losartan (COZAAR) 25 mg tablet TAKE 1 TAB BY MOUTH DAILY. 30 Tab 3   ??? ezetimibe (ZETIA) 10 mg tablet TAKE 1 TAB BY MOUTH NIGHTLY. 90 Tab 3   ??? pantoprazole (PROTONIX) 40 mg tablet TAKE 1 TABLET BY MOUTH DAILY 90 Tab 0   ??? CHANTIX CONTINUING MONTH BOX 1 mg tablet TAKE 1 TABLET BY MOUTH TWICE A DAY 56 Tab 3   ??? nabumetone (RELAFEN) 500 mg tablet TAKE 1 TAB BY MOUTH TWO (2) TIMES A DAY. 60 Tab 0   ??? LEVEMIR FLEXTOUCH U-100 INSULN 100 unit/mL (3 mL) inpn INJECT 34 UNITS UNDER THE SKIN NIGHTLY AT BEDTIME 45 Adjustable Dose Pre-filled Pen Syringe 1   ??? lidocaine (LIDODERM) 5 % Apply patch to the affected area for 12 hours a day and remove for 12 hours a day. 10 Each 2   ??? carvedilol (COREG) 25 mg tablet Take 25 mg by mouth two (2) times daily (with meals).     ??? nabumetone (RELAFEN) 500 mg tablet Take 500 mg by mouth two (2) times daily as needed for Pain.     ??? magnesium oxide (MAG-OX) 400 mg tablet Take 1 Tab by mouth daily. 90 Tab 3   ??? DULoxetine (CYMBALTA) 60 mg capsule TAKE 1 CAPSULE BY MOUTH DAILY 90 Cap 2   ??? insulin lispro (HUMALOG U-100 INSULIN) 100 unit/mL injection by SubCUTAneous route. 0-8 units three times a day as needed     ??? VESICARE 10 mg tablet TAKE 1 TABLET BY MOUTH DAILY. 90 Tab 4   ??? VITAMIN D3 2,000 unit cap capsule TAKE ONE CAPSULE BY MOUTH DAILY 90 Cap 3   ??? aspirin delayed-release 81 mg tablet TAKE 1 TAB BY MOUTH DAILY. 90 Tab 3   ???  gabapentin (NEURONTIN) 300 mg capsule Take 1,200 mg by mouth daily.     ??? acetaminophen (ACETAMINOPHEN EXTRA STRENGTH) 500 mg tablet Take 1,000 mg by mouth every six (6) hours. With tramadol     ??? nitroglycerin (NITROSTAT) 0.4 mg SL tablet 1 Tab by SubLINGual route every five (5) minutes as needed for Chest Pain for up to 3 doses. 1 Bottle 1   ??? bumetanide (BUMEX) 2 mg tablet Take 2 mg by mouth daily.       Allergies   Allergen Reactions   ??? Morphine Anaphylaxis     Tolerates oxycodone without problem   ??? Avalide [Irbesartan-Hydrochlorothiazide] Myalgia   ??? Betadine [Povidone-Iodine] Rash   ??? Demerol [Meperidine] Other (comments)     Makes her feel crazy   ??? Pcn [Penicillins] Swelling   ??? Prinivil [Lisinopril] Cough   ??? Statins-Hmg-Coa Reductase Inhibitors Myalgia   ??? Sulfa (Sulfonamide Antibiotics) Unknown (comments)   ??? Tricor [Fenofibrate Micronized] Myalgia     Past Medical  History:   Diagnosis Date   ??? Acute MI (HCC)     x3   ??? Arthritis    ??? CAD (coronary artery disease)    ??? Cervical spondylolysis    ??? Chronic pain    ??? COPD    ??? Diabetes (HCC)    ??? GERD (gastroesophageal reflux disease)    ??? Goiter 04/20/2012   ??? Hypertension    ??? Neuropathy 04/06/2013     Past Surgical History:   Procedure Laterality Date   ??? 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 CARDIAC SURG PROCEDURE UNLIST      PTCA   ??? HX KNEE ARTHROSCOPY Right     X2   ??? HX OTHER SURGICAL  1970    GANGLION CYST   ??? HX TONSILLECTOMY  1963     Family History   Problem Relation Age of Onset   ??? Lung Disease Mother 5184   ??? Stroke Father    ??? Lung Disease Brother 10076        COPD   ??? Hypertension Brother    ??? Cancer Brother         PROSTATE   ??? Heart Disease Brother    ??? Diabetes Brother    ??? No Known Problems Paternal Grandmother    ??? Anesth Problems Neg Hx      Social History     Tobacco Use   ??? Smoking status: Former Smoker     Packs/day: 0.30     Years: 45.00     Pack years: 13.50     Types: Cigarettes   ??? Smokeless tobacco: Never Used   ??? Tobacco  comment: quit around the first of November   Substance Use Topics   ??? Alcohol use: Yes     Frequency: Monthly or less     Drinks per session: 1 or 2     Binge frequency: Never     Comment: may be 3 times a year if that      T.      Objective:  Visit Vitals  BP 110/70   Ht 5\' 4"  (1.626 m)   Wt 192 lb (87.1 kg)   BMI 32.96 kg/m??     wdwn 67 yo wf  In NAD. A&O.  HEENT -- Pupils round.  O/P Clear.  Neck -- Supple. No JVD.  Heart -- RRR. No R/M/G.  Lungs -- CTA.  Abdomen -- Soft. Non-tender. Non-distended. No masses. Bowel sounds present.  Extremities -- No edema.        Assessment/Plan:    ICD-10-CM ICD-9-CM    1. Constipation, unspecified constipation type K59.00 564.00 REFERRAL TO GASTROENTEROLOGY   2. Hypertension complicating diabetes (HCC) E11.59 250.80     I10 401.9    3. Atherosclerosis of aorta (HCC) I70.0 440.0    4. Uncontrolled type 2 diabetes mellitus with hyperglycemia (HCC) E11.65 250.02    5. 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 Type 2 diabetes mellitus with diabetic neuropathy, with long-term current use of insulin (HCC) E11.40 250.60     Z79.4 357.2      V58.67    7. Long term (current) use of insulin (HCC) Z79.4 V58.67    8. Severe obesity with body mass index (BMI) of 35.0 to 39.9 with serious comorbidity (HCC) E66.01 278.01      Increase miralax to twice a day, follow up gi  Author:  Lake Bells, MD 06/15/2018 4:46 PM

## 2018-06-15 NOTE — Progress Notes (Signed)
Kelsey [name] is a 67 y.o. female and presents with   Chief Complaint   Patient presents with   ??? Constipation   .  Kelsey [name] had some results with miralax and MOM then got constipated again.  Has some upper abd pain, has some nausea,  Has lost about 10 lbs in the last few months.      Current Outpatient Medications   Medication Sig Dispense Refill   ??? clopidogrel (PLAVIX) 75 mg tab TAKE 1 TABLET BY MOUTH DAILY 30 Tab 5   ??? metFORMIN ER (GLUCOPHAGE XR) 500 mg tablet TAKE 2 TABLETS BY MOUTH IN THE MORNING WITH BREAKFAST AND 3 TABLETS BY MOUTH AT BEDTIME 450 Tab 3   ??? losartan (COZAAR) 25 mg tablet TAKE 1 TAB BY MOUTH DAILY. 30 Tab 3   ??? ezetimibe (ZETIA) 10 mg tablet TAKE 1 TAB BY MOUTH NIGHTLY. 90 Tab 3   ??? pantoprazole (PROTONIX) 40 mg tablet TAKE 1 TABLET BY MOUTH DAILY 90 Tab 0   ??? CHANTIX CONTINUING MONTH BOX 1 mg tablet TAKE 1 TABLET BY MOUTH TWICE A DAY 56 Tab 3   ??? nabumetone (RELAFEN) 500 mg tablet TAKE 1 TAB BY MOUTH TWO (2) TIMES A DAY. 60 Tab 0   ??? LEVEMIR FLEXTOUCH U-100 INSULN 100 unit/mL (3 mL) inpn INJECT 34 UNITS UNDER THE SKIN NIGHTLY AT BEDTIME 45 Adjustable Dose Pre-filled Pen Syringe 1   ??? lidocaine (LIDODERM) 5 % Apply patch to the affected area for 12 hours a day and remove for 12 hours a day. 10 Each 2   ??? carvedilol (COREG) 25 mg tablet Take 25 mg by mouth two (2) times daily (with meals).     ??? nabumetone (RELAFEN) 500 mg tablet Take 500 mg by mouth two (2) times daily as needed for Pain.     ??? magnesium oxide (MAG-OX) 400 mg tablet Take 1 Tab by mouth daily. 90 Tab 3   ??? DULoxetine (CYMBALTA) 60 mg capsule TAKE 1 CAPSULE BY MOUTH DAILY 90 Cap 2   ??? insulin lispro (HUMALOG U-100 INSULIN) 100 unit/mL injection by SubCUTAneous route. 0-8 units three times a day as needed     ??? VESICARE 10 mg tablet TAKE 1 TABLET BY MOUTH DAILY. 90 Tab 4   ??? VITAMIN D3 2,000 unit cap capsule TAKE ONE CAPSULE BY MOUTH DAILY 90 Cap 3   ??? aspirin delayed-release 81 mg tablet TAKE 1 TAB BY MOUTH DAILY. 90 Tab 3    ??? gabapentin (NEURONTIN) 300 mg capsule Take 1,200 mg by mouth daily.     ??? acetaminophen (ACETAMINOPHEN EXTRA STRENGTH) 500 mg tablet Take 1,000 mg by mouth every six (6) hours. With tramadol     ??? nitroglycerin (NITROSTAT) 0.4 mg SL tablet 1 Tab by SubLINGual route every five (5) minutes as needed for Chest Pain for up to 3 doses. 1 Bottle 1   ??? bumetanide (BUMEX) 2 mg tablet Take 2 mg by mouth daily.       Allergies   Allergen Reactions   ??? Morphine Anaphylaxis     Tolerates oxycodone without problem   ??? Avalide [Irbesartan-Hydrochlorothiazide] Myalgia   ??? Betadine [Povidone-Iodine] Rash   ??? Demerol [Meperidine] Other (comments)     Makes her feel crazy   ??? Pcn [Penicillins] Swelling   ??? Prinivil [Lisinopril] Cough   ??? Statins-Hmg-Coa Reductase Inhibitors Myalgia   ??? Sulfa (Sulfonamide Antibiotics) Unknown (comments)   ??? Tricor [Fenofibrate Micronized] Myalgia     Past Medical  History:   Diagnosis Date   ??? Acute MI (HCC)     x3   ??? Arthritis    ??? CAD (coronary artery disease)    ??? Cervical spondylolysis    ??? Chronic pain    ??? COPD    ??? Diabetes (HCC)    ??? GERD (gastroesophageal reflux disease)    ??? Goiter 04/20/2012   ??? Hypertension    ??? Neuropathy 04/06/2013     Past Surgical History:   Procedure Laterality Date   ??? 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 CARDIAC SURG PROCEDURE UNLIST      PTCA   ??? HX KNEE ARTHROSCOPY Right     X2   ??? HX OTHER SURGICAL  1970    GANGLION CYST   ??? HX TONSILLECTOMY  1963     Family History   Problem Relation Age of Onset   ??? Lung Disease Mother 63   ??? Stroke Father    ??? Lung Disease Brother 50        COPD   ??? Hypertension Brother    ??? Cancer Brother         PROSTATE   ??? Heart Disease Brother    ??? Diabetes Brother    ??? No Known Problems Paternal Grandmother    ??? Anesth Problems Neg Hx      Social History     Tobacco Use   ??? Smoking status: Former Smoker     Packs/day: 0.30     Years: 45.00     Pack years: 13.50     Types: Cigarettes   ??? Smokeless tobacco: Never Used    ??? Tobacco comment: quit around the first of November   Substance Use Topics   ??? Alcohol use: Yes     Frequency: Monthly or less     Drinks per session: 1 or 2     Binge frequency: Never     Comment: may be 3 times a year if that      T.      Objective:  Visit Vitals  BP 110/70   Ht 5\' 4"  (1.626 m)   Wt 192 lb (87.1 kg)   BMI 32.96 kg/m??     wdwn 67 yo wf  In NAD. A&O.  HEENT -- Pupils round.  O/P Clear.  Neck -- Supple. No JVD.  Heart -- RRR. No R/M/G.  Lungs -- CTA.  Abdomen -- Soft. Non-tender. Non-distended. No masses. Bowel sounds present.  Extremities -- No edema.        Assessment/Plan:    ICD-10-CM ICD-9-CM    1. Constipation, unspecified constipation type K59.00 564.00 REFERRAL TO GASTROENTEROLOGY   2. Hypertension complicating diabetes (HCC) E11.59 250.80     I10 401.9    3. Atherosclerosis of aorta (HCC) I70.0 440.0    4. Uncontrolled type 2 diabetes mellitus with hyperglycemia (HCC) E11.65 250.02    5. 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 Type 2 diabetes mellitus with diabetic neuropathy, with long-term current use of insulin (HCC) E11.40 250.60     Z79.4 357.2      V58.67    7. Long term (current) use of insulin (HCC) Z79.4 V58.67    8. Severe obesity with body mass index (BMI) of 35.0 to 39.9 with serious comorbidity (HCC) E66.01 278.01      Increase miralax to twice a day, follow up gi  Author:  Lake Bells, MD 06/15/2018 4:46 PM

## 2018-06-22 MED ORDER — DULOXETINE 60 MG CAP, DELAYED RELEASE
60 mg | ORAL_CAPSULE | ORAL | 2 refills | Status: DC
Start: 2018-06-22 — End: 2019-03-24

## 2018-06-23 ENCOUNTER — Encounter

## 2018-06-23 MED ORDER — ASPIRIN 81 MG TAB, DELAYED RELEASE
81 mg | ORAL_TABLET | ORAL | 3 refills | Status: DC
Start: 2018-06-23 — End: 2019-03-31

## 2018-06-25 MED ORDER — NABUMETONE 500 MG TAB
500 mg | ORAL_TABLET | ORAL | 0 refills | Status: DC
Start: 2018-06-25 — End: 2018-07-21

## 2018-07-01 ENCOUNTER — Ambulatory Visit: Admit: 2018-07-01 | Attending: Internal Medicine | Primary: Internal Medicine

## 2018-07-01 ENCOUNTER — Ambulatory Visit: Attending: Internal Medicine | Primary: Internal Medicine

## 2018-07-01 DIAGNOSIS — E1159 Type 2 diabetes mellitus with other circulatory complications: Secondary | ICD-10-CM

## 2018-07-01 DIAGNOSIS — I152 Hypertension secondary to endocrine disorders: Secondary | ICD-10-CM

## 2018-07-01 NOTE — Progress Notes (Signed)
Kelsey [name] is a 67 y.o. female and presents with   Chief Complaint   Patient presents with   ??? Other     fell,dizzy   .  Patient had diarrhea following taking MOM..  Got dizzy last night and fell. injuiring right arm.  Did not hit head      Current Outpatient Medications   Medication Sig Dispense Refill   ??? nabumetone (RELAFEN) 500 mg tablet TAKE 1 TAB BY MOUTH TWO (2) TIMES A DAY. 60 Tab 0   ??? aspirin delayed-release 81 mg tablet TAKE 1 TAB BY MOUTH DAILY. 90 Tab 3   ??? DULoxetine (CYMBALTA) 60 mg capsule TAKE 1 CAPSULE BY MOUTH DAILY 90 Cap 2   ??? clopidogrel (PLAVIX) 75 mg tab TAKE 1 TABLET BY MOUTH DAILY 30 Tab 5   ??? metFORMIN ER (GLUCOPHAGE XR) 500 mg tablet TAKE 2 TABLETS BY MOUTH IN THE MORNING WITH BREAKFAST AND 3 TABLETS BY MOUTH AT BEDTIME 450 Tab 3   ??? losartan (COZAAR) 25 mg tablet TAKE 1 TAB BY MOUTH DAILY. 30 Tab 3   ??? ezetimibe (ZETIA) 10 mg tablet TAKE 1 TAB BY MOUTH NIGHTLY. 90 Tab 3   ??? pantoprazole (PROTONIX) 40 mg tablet TAKE 1 TABLET BY MOUTH DAILY 90 Tab 0   ??? CHANTIX CONTINUING MONTH BOX 1 mg tablet TAKE 1 TABLET BY MOUTH TWICE A DAY 56 Tab 3   ??? LEVEMIR FLEXTOUCH U-100 INSULN 100 unit/mL (3 mL) inpn INJECT 34 UNITS UNDER THE SKIN NIGHTLY AT BEDTIME 45 Adjustable Dose Pre-filled Pen Syringe 1   ??? lidocaine (LIDODERM) 5 % Apply patch to the affected area for 12 hours a day and remove for 12 hours a day. 10 Each 2   ??? carvedilol (COREG) 25 mg tablet Take 25 mg by mouth two (2) times daily (with meals).     ??? nabumetone (RELAFEN) 500 mg tablet Take 500 mg by mouth two (2) times daily as needed for Pain.     ??? magnesium oxide (MAG-OX) 400 mg tablet Take 1 Tab by mouth daily. 90 Tab 3   ??? insulin lispro (HUMALOG U-100 INSULIN) 100 unit/mL injection by SubCUTAneous route. 0-8 units three times a day as needed     ??? VESICARE 10 mg tablet TAKE 1 TABLET BY MOUTH DAILY. 90 Tab 4   ??? VITAMIN D3 2,000 unit cap capsule TAKE ONE CAPSULE BY MOUTH DAILY 90 Cap 3   ??? gabapentin (NEURONTIN) 300 mg capsule  Take 1,200 mg by mouth daily.     ??? acetaminophen (ACETAMINOPHEN EXTRA STRENGTH) 500 mg tablet Take 1,000 mg by mouth every six (6) hours. With tramadol     ??? nitroglycerin (NITROSTAT) 0.4 mg SL tablet 1 Tab by SubLINGual route every five (5) minutes as needed for Chest Pain for up to 3 doses. 1 Bottle 1   ??? bumetanide (BUMEX) 2 mg tablet Take 2 mg by mouth daily.       Allergies   Allergen Reactions   ??? Morphine Anaphylaxis     Tolerates oxycodone without problem   ??? Avalide [Irbesartan-Hydrochlorothiazide] Myalgia   ??? Betadine [Povidone-Iodine] Rash   ??? Demerol [Meperidine] Other (comments)     Makes her feel crazy   ??? Pcn [Penicillins] Swelling   ??? Prinivil [Lisinopril] Cough   ??? Statins-Hmg-Coa Reductase Inhibitors Myalgia   ??? Sulfa (Sulfonamide Antibiotics) Unknown (comments)   ??? Tricor [Fenofibrate Micronized] Myalgia     Past Medical History:   Diagnosis Date   ???  Acute MI (HCC)     x3   ??? Arthritis    ??? CAD (coronary artery disease)    ??? Cervical spondylolysis    ??? Chronic pain    ??? COPD    ??? Diabetes (HCC)    ??? GERD (gastroesophageal reflux disease)    ??? Goiter 04/20/2012   ??? Hypertension    ??? Neuropathy 04/06/2013     Past Surgical History:   Procedure Laterality Date   ??? 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 CARDIAC SURG PROCEDURE UNLIST      PTCA   ??? HX KNEE ARTHROSCOPY Right     X2   ??? HX OTHER SURGICAL  1970    GANGLION CYST   ??? HX TONSILLECTOMY  1963     Family History   Problem Relation Age of Onset   ??? Lung Disease Mother 8084   ??? Stroke Father    ??? Lung Disease Brother 5476        COPD   ??? Hypertension Brother    ??? Cancer Brother         PROSTATE   ??? Heart Disease Brother    ??? Diabetes Brother    ??? No Known Problems Paternal Grandmother    ??? Anesth Problems Neg Hx      Social History     Tobacco Use   ??? Smoking status: Former Smoker     Packs/day: 0.30     Years: 45.00     Pack years: 13.50     Types: Cigarettes   ??? Smokeless tobacco: Never Used   ??? Tobacco comment: quit around the first of  November   Substance Use Topics   ??? Alcohol use: Yes     Frequency: Monthly or less     Drinks per session: 1 or 2     Binge frequency: Never     Comment: may be 3 times a year if that      The patient has a history of falls. A plan of care for falls was documented..  Depression screen neg      Objective:  Visit Vitals  BP 160/70   Ht 5\' 4"  (1.626 m)   Wt 192 lb (87.1 kg)   BMI 32.96 kg/m??     Heavy 67 yo wf  In NAD. A&O.  HEENT -- Pupils round.  O/P Clear.  Neck -- Supple. No JVD.  Heart -- RRR. 2/6  Lungs -- CTA.  Abdomen -- Soft. Non-tender. Non-distended. No masses. Bowel sounds present.  Extremities -- +1 pedal edema.  Right arm- multiple skin tears from elbow to wrist.        Assessment/Plan:    ICD-10-CM ICD-9-CM    1. Hypertension complicating diabetes (HCC) E11.59 250.80     I10 401.9    2. Atherosclerosis of aorta (HCC) I70.0 440.0    3. 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 Type 2 diabetes mellitus with diabetic neuropathy, with long-term current use of insulin (HCC) E11.40 250.60     Z79.4 357.2      V58.67    5. Long term (current) use of insulin (HCC) Z79.4 V58.67    6. Severe obesity with body mass index (BMI) of 35.0 to 39.9 with serious comorbidity (HCC) E66.01 278.01    7. Skin tear of right forearm without complication, subsequent encounter S51.811D V58.89  881.00      Arm is washed, cleaned and dressed. Silvadene used , covered with gauze.    Time spent- 30 min    Author:  Lake Bells, MD 07/01/2018 4:30 PM

## 2018-07-01 NOTE — Progress Notes (Signed)
Kelsey [name] is a 67 y.o. female and presents with   Chief Complaint   Patient presents with   ??? Other     fell,dizzy   .  Patient had diarrhea following taking MOM..  Got dizzy last night and fell. injuiring right arm.  Did not hit head      Current Outpatient Medications   Medication Sig Dispense Refill   ??? nabumetone (RELAFEN) 500 mg tablet TAKE 1 TAB BY MOUTH TWO (2) TIMES A DAY. 60 Tab 0   ??? aspirin delayed-release 81 mg tablet TAKE 1 TAB BY MOUTH DAILY. 90 Tab 3   ??? DULoxetine (CYMBALTA) 60 mg capsule TAKE 1 CAPSULE BY MOUTH DAILY 90 Cap 2   ??? clopidogrel (PLAVIX) 75 mg tab TAKE 1 TABLET BY MOUTH DAILY 30 Tab 5   ??? metFORMIN ER (GLUCOPHAGE XR) 500 mg tablet TAKE 2 TABLETS BY MOUTH IN THE MORNING WITH BREAKFAST AND 3 TABLETS BY MOUTH AT BEDTIME 450 Tab 3   ??? losartan (COZAAR) 25 mg tablet TAKE 1 TAB BY MOUTH DAILY. 30 Tab 3   ??? ezetimibe (ZETIA) 10 mg tablet TAKE 1 TAB BY MOUTH NIGHTLY. 90 Tab 3   ??? pantoprazole (PROTONIX) 40 mg tablet TAKE 1 TABLET BY MOUTH DAILY 90 Tab 0   ??? CHANTIX CONTINUING MONTH BOX 1 mg tablet TAKE 1 TABLET BY MOUTH TWICE A DAY 56 Tab 3   ??? LEVEMIR FLEXTOUCH U-100 INSULN 100 unit/mL (3 mL) inpn INJECT 34 UNITS UNDER THE SKIN NIGHTLY AT BEDTIME 45 Adjustable Dose Pre-filled Pen Syringe 1   ??? lidocaine (LIDODERM) 5 % Apply patch to the affected area for 12 hours a day and remove for 12 hours a day. 10 Each 2   ??? carvedilol (COREG) 25 mg tablet Take 25 mg by mouth two (2) times daily (with meals).     ??? nabumetone (RELAFEN) 500 mg tablet Take 500 mg by mouth two (2) times daily as needed for Pain.     ??? magnesium oxide (MAG-OX) 400 mg tablet Take 1 Tab by mouth daily. 90 Tab 3   ??? insulin lispro (HUMALOG U-100 INSULIN) 100 unit/mL injection by SubCUTAneous route. 0-8 units three times a day as needed     ??? VESICARE 10 mg tablet TAKE 1 TABLET BY MOUTH DAILY. 90 Tab 4   ??? VITAMIN D3 2,000 unit cap capsule TAKE ONE CAPSULE BY MOUTH DAILY 90 Cap 3    ??? gabapentin (NEURONTIN) 300 mg capsule Take 1,200 mg by mouth daily.     ??? acetaminophen (ACETAMINOPHEN EXTRA STRENGTH) 500 mg tablet Take 1,000 mg by mouth every six (6) hours. With tramadol     ??? nitroglycerin (NITROSTAT) 0.4 mg SL tablet 1 Tab by SubLINGual route every five (5) minutes as needed for Chest Pain for up to 3 doses. 1 Bottle 1   ??? bumetanide (BUMEX) 2 mg tablet Take 2 mg by mouth daily.       Allergies   Allergen Reactions   ??? Morphine Anaphylaxis     Tolerates oxycodone without problem   ??? Avalide [Irbesartan-Hydrochlorothiazide] Myalgia   ??? Betadine [Povidone-Iodine] Rash   ??? Demerol [Meperidine] Other (comments)     Makes her feel crazy   ??? Pcn [Penicillins] Swelling   ??? Prinivil [Lisinopril] Cough   ??? Statins-Hmg-Coa Reductase Inhibitors Myalgia   ??? Sulfa (Sulfonamide Antibiotics) Unknown (comments)   ??? Tricor [Fenofibrate Micronized] Myalgia     Past Medical History:   Diagnosis Date   ???  Acute MI (HCC)     x3   ??? Arthritis    ??? CAD (coronary artery disease)    ??? Cervical spondylolysis    ??? Chronic pain    ??? COPD    ??? Diabetes (HCC)    ??? GERD (gastroesophageal reflux disease)    ??? Goiter 04/20/2012   ??? Hypertension    ??? Neuropathy 04/06/2013     Past Surgical History:   Procedure Laterality Date   ??? 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 CARDIAC SURG PROCEDURE UNLIST      PTCA   ??? HX KNEE ARTHROSCOPY Right     X2   ??? HX OTHER SURGICAL  1970    GANGLION CYST   ??? HX TONSILLECTOMY  1963     Family History   Problem Relation Age of Onset   ??? Lung Disease Mother 5484   ??? Stroke Father    ??? Lung Disease Brother 8276        COPD   ??? Hypertension Brother    ??? Cancer Brother         PROSTATE   ??? Heart Disease Brother    ??? Diabetes Brother    ??? No Known Problems Paternal Grandmother    ??? Anesth Problems Neg Hx      Social History     Tobacco Use   ??? Smoking status: Former Smoker     Packs/day: 0.30     Years: 45.00     Pack years: 13.50     Types: Cigarettes   ??? Smokeless tobacco: Never Used    ??? Tobacco comment: quit around the first of November   Substance Use Topics   ??? Alcohol use: Yes     Frequency: Monthly or less     Drinks per session: 1 or 2     Binge frequency: Never     Comment: may be 3 times a year if that      The patient has a history of falls. A plan of care for falls was documented..  Depression screen neg      Objective:  Visit Vitals  BP 160/70   Ht 5\' 4"  (1.626 m)   Wt 192 lb (87.1 kg)   BMI 32.96 kg/m??     Heavy 67 yo wf  In NAD. A&O.  HEENT -- Pupils round.  O/P Clear.  Neck -- Supple. No JVD.  Heart -- RRR. 2/6  Lungs -- CTA.  Abdomen -- Soft. Non-tender. Non-distended. No masses. Bowel sounds present.  Extremities -- +1 pedal edema.  Right arm- multiple skin tears from elbow to wrist.        Assessment/Plan:    ICD-10-CM ICD-9-CM    1. Hypertension complicating diabetes (HCC) E11.59 250.80     I10 401.9    2. Atherosclerosis of aorta (HCC) I70.0 440.0    3. 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 Type 2 diabetes mellitus with diabetic neuropathy, with long-term current use of insulin (HCC) E11.40 250.60     Z79.4 357.2      V58.67    5. Long term (current) use of insulin (HCC) Z79.4 V58.67    6. Severe obesity with body mass index (BMI) of 35.0 to 39.9 with serious comorbidity (HCC) E66.01 278.01    7. Skin tear of right forearm without complication, subsequent encounter S51.811D V58.89  881.00      Arm is washed, cleaned and dressed. Silvadene used , covered with gauze.    Time spent- 30 min    Author:  Lake Bells, MD 07/01/2018 4:30 PM

## 2018-07-02 NOTE — Telephone Encounter (Signed)
Ok please call in a 30 g. supply

## 2018-07-22 ENCOUNTER — Inpatient Hospital Stay: Payer: MEDICARE

## 2018-07-22 ENCOUNTER — Encounter

## 2018-07-22 LAB — POCT GLUCOSE: POC Glucose: 116 mg/dL — ABNORMAL HIGH (ref 65–100)

## 2018-07-22 LAB — GLUCOSE, POC: Glucose (POC): 116 mg/dL — ABNORMAL HIGH (ref 65–100)

## 2018-07-22 MED ORDER — PROPOFOL 10 MG/ML IV EMUL
10 mg/mL | INTRAVENOUS | Status: DC | PRN
Start: 2018-07-22 — End: 2018-07-22
  Administered 2018-07-22 (×4): via INTRAVENOUS

## 2018-07-22 MED ORDER — LIDOCAINE (PF) 20 MG/ML (2 %) IJ SOLN
20 mg/mL (2 %) | INTRAMUSCULAR | Status: DC | PRN
Start: 2018-07-22 — End: 2018-07-22
  Administered 2018-07-22: 15:00:00 via INTRAVENOUS

## 2018-07-22 MED ORDER — SODIUM CHLORIDE 0.9 % IV
INTRAVENOUS | Status: DC
Start: 2018-07-22 — End: 2018-07-22

## 2018-07-22 MED ORDER — SODIUM CHLORIDE 0.9 % IJ SYRG
INTRAMUSCULAR | Status: DC | PRN
Start: 2018-07-22 — End: 2018-07-22

## 2018-07-22 MED ORDER — EPINEPHRINE 0.1 MG/ML SYRINGE
0.1 mg/mL | Freq: Once | INTRAMUSCULAR | Status: DC | PRN
Start: 2018-07-22 — End: 2018-07-22

## 2018-07-22 MED ORDER — FLUMAZENIL 0.1 MG/ML IV SOLN
0.1 mg/mL | INTRAVENOUS | Status: DC | PRN
Start: 2018-07-22 — End: 2018-07-22

## 2018-07-22 MED ORDER — SIMETHICONE 40 MG/0.6 ML ORAL DROPS, SUSP
40 mg/0.6 mL | ORAL | Status: DC | PRN
Start: 2018-07-22 — End: 2018-07-22

## 2018-07-22 MED ORDER — SODIUM CHLORIDE 0.9 % IJ SYRG
Freq: Three times a day (TID) | INTRAMUSCULAR | Status: DC
Start: 2018-07-22 — End: 2018-07-22

## 2018-07-22 MED ORDER — SODIUM CHLORIDE 0.9 % IV
INTRAVENOUS | Status: DC | PRN
Start: 2018-07-22 — End: 2018-07-22
  Administered 2018-07-22: 15:00:00 via INTRAVENOUS

## 2018-07-22 MED ORDER — NALOXONE 0.4 MG/ML INJECTION
0.4 mg/mL | INTRAMUSCULAR | Status: DC | PRN
Start: 2018-07-22 — End: 2018-07-22

## 2018-07-22 MED ORDER — ATROPINE 0.1 MG/ML SYRINGE
0.1 mg/mL | Freq: Once | INTRAMUSCULAR | Status: DC | PRN
Start: 2018-07-22 — End: 2018-07-22

## 2018-07-22 MED ORDER — FENTANYL CITRATE (PF) 50 MCG/ML IJ SOLN
50 mcg/mL | INTRAMUSCULAR | Status: DC | PRN
Start: 2018-07-22 — End: 2018-07-22

## 2018-07-22 MED ORDER — NABUMETONE 500 MG TAB
500 mg | ORAL_TABLET | ORAL | 0 refills | Status: DC
Start: 2018-07-22 — End: 2018-10-27

## 2018-07-22 MED FILL — SODIUM CHLORIDE 0.9 % IV: INTRAVENOUS | Qty: 1000

## 2018-07-22 NOTE — Anesthesia Post-Procedure Evaluation (Signed)
Procedure(s):  COLONOSCOPY  ESOPHAGOGASTRODUODENOSCOPY (EGD)  ESOPHAGOGASTRODUODENAL (EGD) BIOPSY.    MAC    Anesthesia Post Evaluation      Multimodal analgesia: multimodal analgesia not used between 6 hours prior to anesthesia start to PACU discharge  Patient location during evaluation: PACU  Patient participation: complete - patient participated  Level of consciousness: awake  Pain score: 0  Pain management: adequate  Airway patency: patent  Anesthetic complications: no  Cardiovascular status: acceptable  Respiratory status: acceptable  Hydration status: acceptable  Comments: I have evaluated the patient and meets criteria for discharge from PACU. Mariane Baumgarten., MD        Vitals Value Taken Time   BP 162/67 07/22/2018 12:02 PM   Temp 36.6 ??C (97.8 ??F) 07/22/2018 11:41 AM   Pulse 66 07/22/2018 12:03 PM   Resp 0 07/22/2018 12:04 PM   SpO2 0 % 07/22/2018 12:04 PM   Vitals shown include unvalidated device data.

## 2018-07-22 NOTE — Procedures (Signed)
Procedures  by Marlise Eves., MD at 07/22/18 1139                Author: Marlise Eves., MD  Service: --  Author Type: Physician       Filed: 07/22/18 1144  Date of Service: 07/22/18 1139  Status: Signed          Editor: Marlise Eves., MD (Physician)            Pre-procedure Diagnoses        1. Constipation, unspecified constipation type [K59.00]                           Post-procedure Diagnoses        1. Internal hemorrhoids [K64.8]                           Procedures        1. Kelsey Longest Sterrett Roberts   [name], Kelsey [name]            Colonoscopy Operative Report      Kelsey [name]   [id number]   [birth year]         Procedure Type:   Colonoscopy --diagnostic       Indications: constipation            Pre-operative Diagnosis: see indication above      Post-operative Diagnosis:  See findings below      Operator:  Kelsey Dittus P. Elisha Ponder, MD         Referring Provider: Genene Churn, MD         Sedation:  MAC anesthesia Propofol         Procedure Details:  After informed consent was obtained with all risks and benefits of procedure explained and preoperative exam completed, the patient was taken to the endoscopy suite and placed in the left lateral decubitus position.  Upon sequential  sedation as per above, a digital rectal exam was performed demonstrating internal hemorrhoids.  The Olympus pediatric videocolonoscope  was inserted in the rectum and carefully advanced to the proximal ascending colon. The quality of preparation was not  adequate to rule out lesions > 5 mm.  The colonoscope was slowly withdrawn with careful evaluation between folds. Retroflexion in the rectum was completed .       Findings:    Rectum: normal   Sigmoid: normal   Descending Colon: normal   Transverse Colon: normal   Ascending Colon: normal   Cecum: not intubated   Terminal Ileum: not intubated      The colon was long and  redundant. The preparation was not adequate to rule out lesions > 5 mm.         Specimen Removed:  none      Complications: None.       EBL:  None.      Impression:       As above      Recommendations:   Repeat colonoscopy in one year   Office visit in 3-4 weeks to discuss medical management of constipation         Signed By:  Kelsey Hopes. Elisha Ponder, MD  07/22/2018  11:40 AM

## 2018-07-22 NOTE — Anesthesia Pre-Procedure Evaluation (Signed)
Relevant Problems   No relevant active problems       Anesthetic History   No history of anesthetic complications            Review of Systems / Medical History  Patient summary reviewed, nursing notes reviewed and pertinent labs reviewed    Pulmonary  Within defined limits  COPD               Neuro/Psych   Within defined limits           Cardiovascular  Within defined limits  Hypertension          CAD and CABG         GI/Hepatic/Renal  Within defined limits   GERD           Endo/Other  Within defined limits  Diabetes  Hypothyroidism  Obesity and arthritis     Other Findings              Physical Exam    Airway  Mallampati: II  TM Distance: > 6 cm  Neck ROM: normal range of motion   Mouth opening: Normal     Cardiovascular  Regular rate and rhythm,  S1 and S2 normal,  no murmur, click, rub, or gallop             Dental  No notable dental hx       Pulmonary  Breath sounds clear to auscultation               Abdominal  GI exam deferred       Other Findings            Anesthetic Plan    ASA: 3  Anesthesia type: MAC            Anesthetic plan and risks discussed with: Patient

## 2018-07-22 NOTE — Interval H&P Note (Signed)
Initial RN admission and assessment performed and documented in Endoscopy navigator.     Patient evaluated by anesthesia in pre-procedure holding.     All procedural vital signs, airway assessment, and level of consciousness information monitored and recorded by anesthesia staff on the anesthesia record.     Report received from CRNA post procedure.  Patient transported to recovery area by RN.    Endoscope was pre-cleaned at bedside immediately following procedure by larmar

## 2018-07-22 NOTE — Procedures (Signed)
Procedures  by Marlise Eves., MD at 07/22/18 1144                Author: Marlise Eves., MD  Service: --  Author Type: Physician       Filed: 07/22/18 1147  Date of Service: 07/22/18 1144  Status: Signed          Editor: Marlise Eves., MD (Physician)            Pre-procedure Diagnoses        1. Abnormal celiac antibody panel [R89.4]                           Post-procedure Diagnoses        1. Abnormal celiac antibody panel [R89.4]                           Procedures        1. UPPER GI ENDOSCOPY,BIOPSY [[id number]]                              [place]              [place] (EGD) Procedure Note      Kelsey [name]   1951-09-13   [id number]         Procedure: Endoscopic Gastroduodenoscopy with biopsy      Indication: abnormal celiac antibody panel       Pre-operative Diagnosis: see indication above      Post-operative Diagnosis: see findings below      Operator: Lucina Betty P. Elisha Ponder, MD      Referring Provider:  Genene Churn, MD         Anesthesia/Sedation:  MAC anesthesia           Procedure Details       After informed consent was obtained for the procedure, with all risks and benefits of procedure explained the patient was taken to the endoscopy suite and placed in the left lateral decubitus position.  Following sequential administration of sedation  as per above, the endoscope was inserted into the mouth and advanced under direct vision to second portion of the duodenum.  A careful inspection was made as the gastroscope was withdrawn, including a retroflexed view of the proximal stomach; findings  and interventions are described below.        Findings:    Esophagus:normal   Stomach: normal    Duodenum: normal to second portion - cold biopsies taken         Therapies:  none      Specimens: 1. duodenum           EBL: None        Complications:   None; patient tolerated the procedure well.              Impression:     As above      Recommendations:   Follow up surgical pathology   Proceed to colonoscopy         Signed By:  Delrae Alfred P. Elisha Ponder, MD           07/22/2018  11:44 AM

## 2018-07-22 NOTE — H&P (Signed)
H&P by Marlise Eves., MD at 07/22/18 1055                Author: Marlise Eves., MD  Service: --  Author Type: Physician       Filed: 07/22/18 1056  Date of Service: 07/22/18 1055  Status: Signed          Editor: Marlise Eves., MD (Physician)               [facility]   [place], [place]             History and Physical           NAME:  Kelsey [name]     DOB:   [birth year]     MRN:   [id number]                   History of Present Illness:  Patient is a  67 y.o. who is seen for constipation and abnormal celiac antibody panel.       PMH:     Past Medical History:        Diagnosis  Date         ?  Acute MI ([place])            x3         ?  Arthritis       ?  CAD (coronary artery disease)       ?  Cervical spondylolysis       ?  Chronic pain       ?  COPD       ?  Diabetes ([place])       ?  GERD (gastroesophageal reflux disease)       ?  Goiter  04/20/2012     ?  Hypertension           ?  Neuropathy  04/06/2013           PSH:     Past Surgical History:         Procedure  Laterality  Date          ?  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  CARDIAC SURG PROCEDURE UNLIST              PTCA          ?  HIP ARTHROSCOPY, DX    2018          hip          ?  HX KNEE ARTHROSCOPY  Right            X2          ?  HX OTHER SURGICAL    1970          GANGLION CYST          ?  HX TONSILLECTOMY    1963           Allergies:     Allergies        Allergen  Reactions         ?  Morphine  Anaphylaxis             Tolerates oxycodone without problem         ?  Avalide [Irbesartan-Hydrochlorothiazide]  Myalgia     ?  Betadine [Povidone-Iodine]  Rash     ?  Demerol [Meperidine]  Other (comments)             Makes her feel crazy         ?  Pcn [Penicillins]  Swelling     ?  Prinivil [Lisinopril]  Cough     ?  Statins-Hmg-Coa Reductase Inhibitors  Myalgia     ?  Sulfa (Sulfonamide Antibiotics)  Unknown (comments)         ?  Tricor [Fenofibrate Micronized]  Myalgia            Home Medications:     Prior to Admission Medications     Prescriptions  Last Dose  Informant  Patient Reported?  Taking?      CHANTIX CONTINUING MONTH BOX 1 mg tablet  07/21/2018 at Unknown time    No  Yes      Sig: TAKE 1 TABLET BY MOUTH TWICE A DAY      DULoxetine (CYMBALTA) 60 mg capsule  07/21/2018 at Unknown time    No  Yes      Sig: TAKE 1 CAPSULE BY MOUTH DAILY      LEVEMIR FLEXTOUCH U-100 INSULN 100 unit/mL (3 mL) inpn  07/20/2018 at Unknown time    No  Yes      Sig: INJECT 34 UNITS UNDER THE SKIN NIGHTLY AT BEDTIME      VESICARE 10 mg tablet  07/21/2018 at Unknown time    No  Yes      Sig: TAKE 1 TABLET BY MOUTH DAILY.      VITAMIN D3 2,000 unit cap capsule  07/21/2018 at Unknown time    No  Yes      Sig: TAKE ONE CAPSULE BY MOUTH DAILY      acetaminophen (ACETAMINOPHEN EXTRA STRENGTH) 500 mg tablet  07/14/2018 at Unknown time    Yes  Yes      Sig: Take 1,000 mg by mouth every six (6) hours. With tramadol      aspirin delayed-release 81 mg tablet  07/21/2018 at Unknown time    No  Yes      Sig: TAKE 1 TAB BY MOUTH DAILY.      bumetanide (BUMEX) 2 mg tablet  07/21/2018 at Unknown time    Yes  Yes      Sig: Take 2 mg by mouth daily.      carvedilol (COREG) 25 mg tablet  07/21/2018 at Unknown time    Yes  Yes      Sig: Take 25 mg by mouth two (2) times daily (with meals).      clopidogrel (PLAVIX) 75 mg tab  07/19/2018 at Unknown time    No  Yes      Sig: TAKE 1 TABLET BY MOUTH DAILY      ezetimibe (ZETIA) 10 mg tablet  07/20/2018 at Unknown time    No  Yes      Sig: TAKE 1 TAB BY MOUTH NIGHTLY.      gabapentin (NEURONTIN) 300 mg capsule  07/21/2018 at Unknown time    Yes  Yes      Sig: Take 1,200 mg by mouth daily.      insulin lispro (HUMALOG U-100 INSULIN) 100 unit/mL injection  Not Taking at Unknown time    Yes  No      Sig: by SubCUTAneous route. 0-8 units three times a day as needed      lidocaine (LIDODERM) 5 %  06/20/2018 at Unknown time    No  Yes      Sig: Apply patch to the affected area for 12 hours a  day and remove for 12 hours a day.      linaclotide (LINZESS PO)  07/20/2018 at Unknown time    Yes  Yes      Sig: Take  by mouth.      losartan (COZAAR) 25 mg tablet  07/21/2018 at Unknown time    No  Yes      Sig: TAKE 1 TAB BY MOUTH DAILY.      magnesium oxide (MAG-OX) 400 mg tablet  07/21/2018 at Unknown time    No  Yes      Sig: Take 1 Tab by mouth daily.      metFORMIN ER (GLUCOPHAGE XR) 500 mg tablet  07/21/2018 at Unknown time    No  Yes      Sig: TAKE 2 TABLETS BY MOUTH IN THE MORNING WITH BREAKFAST AND 3 TABLETS BY MOUTH AT BEDTIME      nabumetone (RELAFEN) 500 mg tablet  07/21/2018 at Unknown time    Yes  Yes      Sig: Take 500 mg by mouth two (2) times daily as needed for Pain.      nitroglycerin (NITROSTAT) 0.4 mg SL tablet  Unknown at Unknown time    No  No      Sig: 1 Tab by SubLINGual route every five (5) minutes as needed for Chest Pain for up to 3 doses.      pantoprazole (PROTONIX) 40 mg tablet  07/21/2018 at Unknown time    No  Yes      Sig: TAKE 1 TABLET BY MOUTH DAILY               Facility-Administered Medications: None           Hospital Medications:     Current Facility-Administered Medications          Medication  Dose  Route  Frequency           ?  0.9% sodium chloride infusion   50 mL/hr  IntraVENous  CONTINUOUS     ?  sodium chloride (NS) flush 5-40 mL   5-40 mL  IntraVENous  Q8H     ?  sodium chloride (NS) flush 5-40 mL   5-40 mL  IntraVENous  PRN     ?  fentaNYL citrate (PF) injection 25-200 mcg   25-200 mcg  IntraVENous  Multiple     ?  naloxone (NARCAN) injection 0.4 mg   0.4 mg  IntraVENous  Multiple     ?  flumazenil (ROMAZICON) 0.1 mg/mL injection 0.2 mg   0.2 mg  IntraVENous  Multiple     ?  simethicone (MYLICON) 54OE/7.0JJ oral drops 80 mg   1.2 mL  Oral  Multiple     ?  atropine injection 0.5 mg   0.5 mg  IntraVENous  ONCE PRN           ?  EPINEPHrine (ADRENALIN) 0.1 mg/mL syringe 1 mg   1 mg  Endoscopically  ONCE PRN           Social History:     Social History          Tobacco Use          ?  Smoking status:  Former Smoker              Packs/day:  0.30         Years:  45.00         Pack years:  13.50         Types:  Cigarettes         ?  Smokeless tobacco:  Never Used        ?  Tobacco comment: quit around the first of November       Substance Use Topics         ?  Alcohol use:  Yes              Frequency:  Monthly or less         Drinks per session:  1 or 2         Binge frequency:  Never             Comment: may be 3 times a year if that           Family History:     Family History         Problem  Relation  Age of Onset          ?  Lung Disease  Mother  29     ?  Stroke  Father       ?  Lung Disease  Brother  53              COPD          ?  Hypertension  Brother       ?  Cancer  Brother                PROSTATE          ?  Heart Disease  Brother       ?  Diabetes  Brother       ?  No Known Problems  Paternal Grandmother            ?  Anesth Problems  Neg Hx                      Review of Systems:         Constitutional: negative fever, negative chills, negative weight loss   Eyes:   negative visual changes   ENT:   negative sore throat, tongue or lip swelling   Respiratory:  negative cough, negative dyspnea   Cards:  negative for chest pain, palpitations, lower extremity edema   GI:   See HPI   GU:  negative for frequency, dysuria   Integument:  negative for rash and pruritus   Heme:  negative for easy bruising and gum/nose bleeding   Musculoskel: negative for myalgias,  back pain and muscle weakness   Neuro: negative for headaches, dizziness, vertigo   Psych:  negative for feelings of anxiety, depression            Objective:        Patient Vitals for the past 8 hrs:             BP  Pulse  Resp  SpO2  Height  Weight             07/22/18 0956  (!) 122/100  68  20  100 %  _0  (1.626 m)  83.9 kg (185 lb)        No intake/output data recorded.   No intake/output data recorded.      EXAM:      NEURO-a&o  HEENT-wnl    LUNGS-clear     COR-regular rate and rhythym      ABD-soft , no tenderness,  no rebound, good bs      EXT-no edema       Data Review       No results for input(s): WBC, HGB, HCT, PLT, HGBEXT, HCTEXT, PLTEXT in the last 72 hours.   No results for input(s): NA, K, CL, CO2, BUN, CREA, GLU, PHOS, CA in the last 72 hours.   No results for input(s): SGOT, GPT, AP, TBIL, TP, ALB, GLOB, GGT, AML, LPSE in the last 72 hours.      No lab exists for component: AMYP, HLPSE   No results for input(s): INR, PTP, APTT in the last 72 hours.      No lab exists for component: INREXT            Assessment:     ??    Constipation   ??  Abnormal celiac antibody panel          Patient Active Problem List        Diagnosis  Code         ?  Coronary atherosclerosis of native coronary artery  I25.10     ?  Other chest pain  R07.89     ?  Other dyspnea and respiratory abnormality  R06.09, R09.89     ?  Mixed hyperlipidemia  E78.2     ?  Type II diabetes mellitus, uncontrolled ([place])  E11.65     ?  Dyspnea  R06.00     ?  Goiter  E04.9     ?  Neuropathy  G62.9     ?  Long term (current) use of insulin (HCC)  Z79.4     ?  Hypertension complicating diabetes ([place])  E11.59, I10     ?  CAD (coronary artery disease)  I25.10     ?  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  Hypertensive urgency  I16.0     ?  Anemia  D64.9     ?  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  Type 2 diabetes mellitus with diabetic neuropathy (HCC)  E11.40     ?  Atherosclerosis of aorta (HCC)  I70.0     ?  Severe obesity with body mass index (BMI) of 35.0 to 39.9 with serious comorbidity (HCC)  E66.01     ?  Aortic mural thrombus (HCC)  I74.10     ?  Hip pain  M25.559         ?  Chronic idiopathic constipation  K59.04          Plan:     ??    Endoscopic procedure with MAC           Signed By:  Birdie Hopes. Elisha Ponder, MD        07/22/2018  10:55 AM

## 2018-07-22 NOTE — Procedures (Signed)
[facility] [place]  [place], [place]        Colonoscopy Operative Report    Kelsey [name]  [id number]  03/03/1951      Procedure Type:   Colonoscopy --diagnostic     Indications: constipation        Pre-operative Diagnosis: see indication above    Post-operative Diagnosis:  See findings below    Operator:  Andria Head P. Elisha Ponder, MD      Referring Provider: Genene Churn, MD      Sedation:  MAC anesthesia Propofol      Procedure Details:  After informed consent was obtained with all risks and benefits of procedure explained and preoperative exam completed, the patient was taken to the endoscopy suite and placed in the left lateral decubitus position.  Upon sequential sedation as per above, a digital rectal exam was performed demonstrating internal hemorrhoids.  The Olympus pediatric videocolonoscope  was inserted in the rectum and carefully advanced to the proximal ascending colon. The quality of preparation was not adequate to rule out lesions > 5 mm.  The colonoscope was slowly withdrawn with careful evaluation between folds. Retroflexion in the rectum was completed .     Findings:   Rectum: normal  Sigmoid: normal  Descending Colon: normal  Transverse Colon: normal  Ascending Colon: normal  Cecum: not intubated  Terminal Ileum: not intubated    The colon was long and redundant. The preparation was not adequate to rule out lesions > 5 mm.      Specimen Removed:  none    Complications: None.     EBL:  None.    Impression:     As above    Recommendations:  Repeat colonoscopy in one year  Office visit in 3-4 weeks to discuss medical management of constipation    Signed By: Birdie Hopes. Elisha Ponder, MD     07/22/2018  11:40 AM

## 2018-07-22 NOTE — Anesthesia Post-Procedure Evaluation (Signed)
Procedure(s):  COLONOSCOPY  ESOPHAGOGASTRODUODENOSCOPY (EGD)  ESOPHAGOGASTRODUODENAL (EGD) BIOPSY.    MAC    Anesthesia Post Evaluation      Multimodal analgesia: multimodal analgesia not used between 6 hours prior to anesthesia start to PACU discharge  Patient location during evaluation: PACU  Patient participation: complete - patient participated  Level of consciousness: awake  Pain score: 0  Pain management: adequate  Airway patency: patent  Anesthetic complications: no  Cardiovascular status: acceptable  Respiratory status: acceptable  Hydration status: acceptable  Comments: I have evaluated the patient and meets criteria for discharge from PACU. Donae Kueker A Frederick Jr., MD        Vitals Value Taken Time   BP 162/67 07/22/2018 12:02 PM   Temp 36.6 ??C (97.8 ??F) 07/22/2018 11:41 AM   Pulse 66 07/22/2018 12:03 PM   Resp 0 07/22/2018 12:04 PM   SpO2 0 % 07/22/2018 12:04 PM   Vitals shown include unvalidated device data.

## 2018-07-22 NOTE — H&P (Signed)
[facility]  [place], [place]      History and Physical       NAME:  Kelsey [name]   DOB:   03-Dec-1951   MRN:   [id number]             History of Present Illness:  Patient is a 67 y.o. who is seen for constipation and abnormal celiac antibody panel.     PMH:  Past Medical History:   Diagnosis Date   ??? Acute MI ([place])     x3   ??? Arthritis    ??? CAD (coronary artery disease)    ??? Cervical spondylolysis    ??? Chronic pain    ??? COPD    ??? Diabetes ([place])    ??? GERD (gastroesophageal reflux disease)    ??? Goiter 04/20/2012   ??? Hypertension    ??? Neuropathy 04/06/2013       PSH:  Past Surgical History:   Procedure Laterality Date   ??? 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 CARDIAC SURG PROCEDURE UNLIST      PTCA   ??? HIP ARTHROSCOPY, DX  2018    hip   ??? HX KNEE ARTHROSCOPY Right     X2   ??? HX OTHER SURGICAL  1970    GANGLION CYST   ??? HX TONSILLECTOMY  1963       Allergies:  Allergies   Allergen Reactions   ??? Morphine Anaphylaxis     Tolerates oxycodone without problem   ??? Avalide [Irbesartan-Hydrochlorothiazide] Myalgia   ??? Betadine [Povidone-Iodine] Rash   ??? Demerol [Meperidine] Other (comments)     Makes her feel crazy   ??? Pcn [Penicillins] Swelling   ??? Prinivil [Lisinopril] Cough   ??? Statins-Hmg-Coa Reductase Inhibitors Myalgia   ??? Sulfa (Sulfonamide Antibiotics) Unknown (comments)   ??? Tricor [Fenofibrate Micronized] Myalgia       Home Medications:  Prior to Admission Medications   Prescriptions Last Dose Informant Patient Reported? Taking?   CHANTIX CONTINUING MONTH BOX 1 mg tablet 07/21/2018 at Unknown time  No Yes   Sig: TAKE 1 TABLET BY MOUTH TWICE A DAY   DULoxetine (CYMBALTA) 60 mg capsule 07/21/2018 at Unknown time  No Yes   Sig: TAKE 1 CAPSULE BY MOUTH DAILY   LEVEMIR FLEXTOUCH U-100 INSULN 100 unit/mL (3 mL) inpn 07/20/2018 at Unknown time  No Yes   Sig: INJECT 34 UNITS UNDER THE SKIN NIGHTLY AT BEDTIME   VESICARE 10 mg tablet 07/21/2018 at Unknown time  No Yes    Sig: TAKE 1 TABLET BY MOUTH DAILY.   VITAMIN D3 2,000 unit cap capsule 07/21/2018 at Unknown time  No Yes   Sig: TAKE ONE CAPSULE BY MOUTH DAILY   acetaminophen (ACETAMINOPHEN EXTRA STRENGTH) 500 mg tablet 07/14/2018 at Unknown time  Yes Yes   Sig: Take 1,000 mg by mouth every six (6) hours. With tramadol   aspirin delayed-release 81 mg tablet 07/21/2018 at Unknown time  No Yes   Sig: TAKE 1 TAB BY MOUTH DAILY.   bumetanide (BUMEX) 2 mg tablet 07/21/2018 at Unknown time  Yes Yes   Sig: Take 2 mg by mouth daily.   carvedilol (COREG) 25 mg tablet 07/21/2018 at Unknown time  Yes Yes   Sig: Take 25 mg by mouth two (2) times daily (with meals).   clopidogrel (PLAVIX) 75 mg tab 07/19/2018 at Unknown time  No Yes  Sig: TAKE 1 TABLET BY MOUTH DAILY   ezetimibe (ZETIA) 10 mg tablet 07/20/2018 at Unknown time  No Yes   Sig: TAKE 1 TAB BY MOUTH NIGHTLY.   gabapentin (NEURONTIN) 300 mg capsule 07/21/2018 at Unknown time  Yes Yes   Sig: Take 1,200 mg by mouth daily.   insulin lispro (HUMALOG U-100 INSULIN) 100 unit/mL injection Not Taking at Unknown time  Yes No   Sig: by SubCUTAneous route. 0-8 units three times a day as needed   lidocaine (LIDODERM) 5 % 06/20/2018 at Unknown time  No Yes   Sig: Apply patch to the affected area for 12 hours a day and remove for 12 hours a day.   linaclotide (LINZESS PO) 07/20/2018 at Unknown time  Yes Yes   Sig: Take  by mouth.   losartan (COZAAR) 25 mg tablet 07/21/2018 at Unknown time  No Yes   Sig: TAKE 1 TAB BY MOUTH DAILY.   magnesium oxide (MAG-OX) 400 mg tablet 07/21/2018 at Unknown time  No Yes   Sig: Take 1 Tab by mouth daily.   metFORMIN ER (GLUCOPHAGE XR) 500 mg tablet 07/21/2018 at Unknown time  No Yes   Sig: TAKE 2 TABLETS BY MOUTH IN THE MORNING WITH BREAKFAST AND 3 TABLETS BY MOUTH AT BEDTIME   nabumetone (RELAFEN) 500 mg tablet 07/21/2018 at Unknown time  Yes Yes   Sig: Take 500 mg by mouth two (2) times daily as needed for Pain.    nitroglycerin (NITROSTAT) 0.4 mg SL tablet Unknown at Unknown time  No No   Sig: 1 Tab by SubLINGual route every five (5) minutes as needed for Chest Pain for up to 3 doses.   pantoprazole (PROTONIX) 40 mg tablet 07/21/2018 at Unknown time  No Yes   Sig: TAKE 1 TABLET BY MOUTH DAILY      Facility-Administered Medications: None       Hospital Medications:  Current Facility-Administered Medications   Medication Dose Route Frequency   ??? 0.9% sodium chloride infusion  50 mL/hr IntraVENous CONTINUOUS   ??? sodium chloride (NS) flush 5-40 mL  5-40 mL IntraVENous Q8H   ??? sodium chloride (NS) flush 5-40 mL  5-40 mL IntraVENous PRN   ??? fentaNYL citrate (PF) injection 25-200 mcg  25-200 mcg IntraVENous Multiple   ??? naloxone (NARCAN) injection 0.4 mg  0.4 mg IntraVENous Multiple   ??? flumazenil (ROMAZICON) 0.1 mg/mL injection 0.2 mg  0.2 mg IntraVENous Multiple   ??? simethicone (MYLICON) 40mg /0.906mL oral drops 80 mg  1.2 mL Oral Multiple   ??? atropine injection 0.5 mg  0.5 mg IntraVENous ONCE PRN   ??? EPINEPHrine (ADRENALIN) 0.1 mg/mL syringe 1 mg  1 mg Endoscopically ONCE PRN       Social History:  Social History     Tobacco Use   ??? Smoking status: Former Smoker     Packs/day: 0.30     Years: 45.00     Pack years: 13.50     Types: Cigarettes   ??? Smokeless tobacco: Never Used   ??? Tobacco comment: quit around the first of November   Substance Use Topics   ??? Alcohol use: Yes     Frequency: Monthly or less     Drinks per session: 1 or 2     Binge frequency: Never     Comment: may be 3 times a year if that       Family History:  Family History   Problem Relation Age of Onset   ???  Lung Disease Mother 16   ??? Stroke Father    ??? Lung Disease Brother 9        COPD   ??? Hypertension Brother    ??? Cancer Brother         PROSTATE   ??? Heart Disease Brother    ??? Diabetes Brother    ??? No Known Problems Paternal Grandmother    ??? Anesth Problems Neg Hx              Review of Systems:       Constitutional: negative fever, negative chills, negative weight loss  Eyes:   negative visual changes  ENT:   negative sore throat, tongue or lip swelling  Respiratory:  negative cough, negative dyspnea  Cards:  negative for chest pain, palpitations, lower extremity edema  GI:   See HPI  GU:  negative for frequency, dysuria  Integument:  negative for rash and pruritus  Heme:  negative for easy bruising and gum/nose bleeding  Musculoskel: negative for myalgias,  back pain and muscle weakness  Neuro: negative for headaches, dizziness, vertigo  Psych:  negative for feelings of anxiety, depression       Objective:     Patient Vitals for the past 8 hrs:   BP Pulse Resp SpO2 Height Weight   07/22/18 0956 (!) 122/100 68 20 100 % 5' 4"  (1.626 m) 83.9 kg (185 lb)     No intake/output data recorded.  No intake/output data recorded.    EXAM:     NEURO-a&o   HEENT-wnl   LUNGS-clear    COR-regular rate and rhythym     ABD-soft , no tenderness, no rebound, good bs     EXT-no edema     Data Review     No results for input(s): WBC, HGB, HCT, PLT, HGBEXT, HCTEXT, PLTEXT in the last 72 hours.  No results for input(s): NA, K, CL, CO2, BUN, CREA, GLU, PHOS, CA in the last 72 hours.  No results for input(s): SGOT, GPT, AP, TBIL, TP, ALB, GLOB, GGT, AML, LPSE in the last 72 hours.    No lab exists for component: AMYP, HLPSE  No results for input(s): INR, PTP, APTT in the last 72 hours.    No lab exists for component: INREXT       Assessment:   ?? Constipation  ?? Abnormal celiac antibody panel     Patient Active Problem List   Diagnosis Code   ??? Coronary atherosclerosis of native coronary artery I25.10   ??? Other chest pain R07.89   ??? Other dyspnea and respiratory abnormality R06.09, R09.89   ??? Mixed hyperlipidemia E78.2   ??? Type II diabetes mellitus, uncontrolled (HCC) E11.65   ??? Dyspnea R06.00   ??? Goiter E04.9   ??? Neuropathy G62.9   ??? Long term (current) use of insulin (HCC) Z79.4    ??? Hypertension complicating diabetes ([place]) E11.59, I10   ??? CAD (coronary artery disease) I25.10   ??? 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 Hypertensive urgency I16.0   ??? Anemia D64.9   ??? 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 Type 2 diabetes mellitus with diabetic neuropathy (HCC) E11.40   ??? Atherosclerosis of aorta (HCC) I70.0   ??? Severe obesity with body mass index (BMI) of 35.0 to 39.9 with serious comorbidity ([place]) E66.01   ??? 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 Hip pain M25.559   ??? Chronic idiopathic constipation K59.04  Plan:   ?? Endoscopic procedure with MAC     Signed By: Birdie Hopes. Elisha Ponder, MD     07/22/2018  10:55 AM

## 2018-07-22 NOTE — Procedures (Signed)
[name]  [place], [place]          Colp (EGD) Procedure Note    Kelsey [name]  12-12-1951  [id number]      Procedure: Endoscopic Gastroduodenoscopy with biopsy    Indication: abnormal celiac antibody panel     Pre-operative Diagnosis: see indication above    Post-operative Diagnosis: see findings below    Operator: Kadey Mihalic P. Elisha Ponder, MD    Referring Provider:  Genene Churn, MD      Anesthesia/Sedation:  MAC anesthesia        Procedure Details     After informed consent was obtained for the procedure, with all risks and benefits of procedure explained the patient was taken to the endoscopy suite and placed in the left lateral decubitus position.  Following sequential administration of sedation as per above, the endoscope was inserted into the mouth and advanced under direct vision to second portion of the duodenum.  A careful inspection was made as the gastroscope was withdrawn, including a retroflexed view of the proximal stomach; findings and interventions are described below.      Findings:   Esophagus:normal  Stomach: normal   Duodenum: normal to second portion - cold biopsies taken      Therapies:  none    Specimens: 1. duodenum         EBL: None      Complications:   None; patient tolerated the procedure well.           Impression:    As above    Recommendations:  Follow up surgical pathology  Proceed to colonoscopy    Signed By: Delrae Alfred P. Elisha Ponder, MD     07/22/2018  11:44 AM

## 2018-07-22 NOTE — Anesthesia Pre-Procedure Evaluation (Signed)
Relevant Problems   No relevant active problems       Anesthetic History   No history of anesthetic complications            Review of Systems / Medical History  Patient summary reviewed, nursing notes reviewed and pertinent labs reviewed    Pulmonary  Within defined limits  COPD               Neuro/Psych   Within defined limits           Cardiovascular  Within defined limits  Hypertension          CAD and CABG         GI/Hepatic/Renal  Within defined limits   GERD           Endo/Other  Within defined limits  Diabetes  Hypothyroidism  Obesity and arthritis     Other Findings              Physical Exam    Airway  Mallampati: II  TM Distance: > 6 cm  Neck ROM: normal range of motion   Mouth opening: Normal     Cardiovascular  Regular rate and rhythm,  S1 and S2 normal,  no murmur, click, rub, or gallop             Dental  No notable dental hx       Pulmonary  Breath sounds clear to auscultation               Abdominal  GI exam deferred       Other Findings            Anesthetic Plan    ASA: 3  Anesthesia type: MAC            Anesthetic plan and risks discussed with: Patient

## 2018-07-22 NOTE — Other (Signed)
Kelsey HeadsDiana H [name]  [birth year]  [id number]    Situation:  Verbal report received from: Kelsey BouchardGeri [name]  Procedure: Procedure(s):  COLONOSCOPY  ESOPHAGOGASTRODUODENOSCOPY (EGD)  ESOPHAGOGASTRODUODENAL (EGD) BIOPSY    Background:    Preoperative diagnosis: CONSTIPATION  Postoperative diagnosis: Normal egd  Poor prep    Operator:  Dr. Tally Joealreja  Assistant(s): Endoscopy Technician-1: Kelsey [name], Kelsey [name]  Endoscopy RN-1: Kelsey [name], Kelsey [name] R, RN    Specimens:   ID Type Source Tests Collected by Time Destination   1 : duodenum Preservative Duodenum  Kelsey [name], Kelsey P., MD 07/22/2018 1106 Pathology     H. Pylori  no    Assessment:  Intra-procedure medications   Anesthesia gave intra-procedure sedation and medications, see anesthesia flow sheet yes    Intravenous fluids: NS@ KVO     Vital signs stable     Abdominal assessment: round and soft    Recommendation:  Discharge patient per MD order.  Family or Friend Spouse Kelsey [name]  Permission to share finding with family or friend yes

## 2018-07-22 NOTE — Other (Signed)
Initial RN admission and assessment performed and documented in Endoscopy navigator.     Patient evaluated by anesthesia in pre-procedure holding.     All procedural vital signs, airway assessment, and level of consciousness information monitored and recorded by anesthesia staff on the anesthesia record.     Report received from CRNA post procedure.  Patient transported to recovery area by RN.    Endoscope was pre-cleaned at bedside immediately following procedure by larmar

## 2018-07-23 ENCOUNTER — Encounter

## 2018-07-23 MED ORDER — FAMOTIDINE 40 MG TAB
40 mg | ORAL_TABLET | Freq: Two times a day (BID) | ORAL | 1 refills | Status: DC
Start: 2018-07-23 — End: 2018-09-16

## 2018-07-23 MED ORDER — PANTOPRAZOLE 40 MG TAB, DELAYED RELEASE
40 mg | ORAL_TABLET | ORAL | 0 refills | Status: DC
Start: 2018-07-23 — End: 2018-10-27

## 2018-08-03 ENCOUNTER — Encounter: Attending: Internal Medicine | Primary: Internal Medicine

## 2018-08-06 NOTE — Telephone Encounter (Signed)
[place] and [place] run out of her Bumetanide 2 mg and her feet are swollen, she needs a refill on that please    Ph# [phone]  [facility] Ph# [phone]

## 2018-08-06 NOTE — Telephone Encounter (Signed)
pls call in one daily #10-1

## 2018-08-19 MED ORDER — CHANTIX CONTINUING MONTH BOX 1 MG TABLET
1 mg | ORAL_TABLET | ORAL | 3 refills | Status: DC
Start: 2018-08-19 — End: 2018-10-04

## 2018-08-24 ENCOUNTER — Encounter

## 2018-08-24 MED ORDER — LOSARTAN 25 MG TAB
25 mg | ORAL_TABLET | ORAL | 3 refills | Status: DC
Start: 2018-08-24 — End: 2019-01-23

## 2018-09-15 ENCOUNTER — Encounter

## 2018-09-16 ENCOUNTER — Encounter

## 2018-09-16 MED ORDER — FAMOTIDINE 40 MG TAB
40 mg | ORAL_TABLET | ORAL | 1 refills | Status: DC
Start: 2018-09-16 — End: 2018-12-01

## 2018-09-17 MED ORDER — CARVEDILOL 12.5 MG TAB
12.5 mg | ORAL_TABLET | ORAL | 0 refills | Status: DC
Start: 2018-09-17 — End: 2018-12-10

## 2018-09-17 NOTE — Progress Notes (Signed)
Called patient and she verified she is taking Coreg 12.5 mg BID. She also made a f/u appointment.    Future Appointments   Date Time Provider Department Center   10/07/2018  1:40 PM Diefenderfer, Mitzi DavenportKatherine G, NP [facility]   12/01/2018 10:00 AM Pahle, Davonna BellingNancy J, [facility]     Will send in a refill for medication.    2 pt identifiers used

## 2018-09-17 NOTE — Progress Notes (Signed)
Please see next encounter.

## 2018-09-17 NOTE — Progress Notes (Signed)
Sorry, I wrote that wrong.  Med list in chart says she takes coreg 25mg  BID.  Refill is for 12.5mg  BID.  Our last note says 12.5mg  BID but she hasn't been seen here since 11/18.

## 2018-09-17 NOTE — Progress Notes (Signed)
Please call to verify what dose of coreg she is taking.  Refill in my box for it but note says she is taking 12.5mg  BID and refill is for 25mg  BID.  Also, she does not have a follow up appointment scheduled.  Last office visit was in 11/18.

## 2018-09-17 NOTE — Telephone Encounter (Signed)
Requested Prescriptions     Signed Prescriptions Disp Refills   ??? carvedilol (COREG) 12.5 mg tablet 180 Tab 0     Sig: TAKE 1 TABLET BY MOUTH TWICE A DAY     Authorizing Provider: Renita PapaIEFENDERFER, KATHERINE G     Ordering User: Cori RazorAILEY, Daisee Centner M     Per verbal orders

## 2018-09-17 NOTE — Progress Notes (Signed)
Please call to verify what dose of coreg she is taking.  Refill in my box for it but note says she is taking 12.5mg BID and refill is for 25mg BID.  Also, she does not have a follow up appointment scheduled.  Last office visit was in 11/18.

## 2018-09-17 NOTE — Progress Notes (Signed)
Called patient and she verified she is taking Coreg 12.5 mg BID. She also made a f/u appointment.    Future Appointments   Date Time Provider Department Center   10/07/2018  1:40 PM Diefenderfer, Katherine G, NP [facility]   12/01/2018 10:00 AM Pahle, Nancy J, MD [facility]     Will send in a refill for medication.    2 pt identifiers used

## 2018-09-17 NOTE — Progress Notes (Signed)
Sorry, I wrote that wrong.  Med list in chart says she takes coreg 25mg BID.  Refill is for 12.5mg BID.  Our last note says 12.5mg BID but she hasn't been seen here since 11/18.

## 2018-09-23 MED ORDER — CHOLECALCIFEROL (VITAMIN D3) 2,000 UNIT CAPSULE
ORAL_CAPSULE | ORAL | 3 refills | Status: AC
Start: 2018-09-23 — End: ?

## 2018-10-01 ENCOUNTER — Ambulatory Visit: Admit: 2018-10-01 | Discharge: 2018-10-01 | Payer: MEDICARE | Primary: Internal Medicine

## 2018-10-01 ENCOUNTER — Encounter

## 2018-10-01 ENCOUNTER — Ambulatory Visit: Admit: 2018-10-01 | Payer: MEDICARE | Attending: Internal Medicine | Primary: Internal Medicine

## 2018-10-01 ENCOUNTER — Ambulatory Visit: Attending: Internal Medicine | Primary: Internal Medicine

## 2018-10-01 ENCOUNTER — Ambulatory Visit

## 2018-10-01 DIAGNOSIS — E1159 Type 2 diabetes mellitus with other circulatory complications: Secondary | ICD-10-CM

## 2018-10-01 DIAGNOSIS — I152 Hypertension secondary to endocrine disorders: Secondary | ICD-10-CM

## 2018-10-01 DIAGNOSIS — Z952 Presence of prosthetic heart valve: Secondary | ICD-10-CM

## 2018-10-01 LAB — TRANSTHORACIC ECHOCARDIOGRAM (TTE) COMPLETE (CONTRAST/BUBBLE/3D PRN)
AV Area by Peak Velocity: 1.3 cm2
AV Area by VTI: 1.3 cm2
AV Mean Gradient: 15.3 mmHg
AV Peak Gradient: 26.8 mmHg
AV Peak Velocity: 258.67 cm/s
AV VTI: 46.1 cm
AVA/BSA Peak Velocity: 0.7 cm2/m2
AVA/BSA VTI: 0.7 cm2/m2
Aortic Root: 2.71 cm
Ascending Aorta: 3.13 cm
E/E' Lateral: 10.46
E/E' Ratio (Averaged): 12.37
E/E' Septal: 14.28
EF BP: 56.8 % (ref 55–100)
Est. RA Pressure: 3 mmHg
IVSd: 1.78 cm — AB (ref 0.6–0.9)
LA Area 4C: 15.3 cm2
LA Major Axis: 4.37 cm
LA Volume 2C: 39.9 mL (ref 22–52)
LA Volume 4C: 40.95 mL (ref 22–52)
LA Volume BP: 42.64 mL (ref 22–52)
LA Volume Index 2C: 21.23 ml/m2 (ref 16–28)
LA Volume Index 4C: 21.79 ml/m2 (ref 16–28)
LA Volume Index BP: 22.69 ml/m2 (ref 16–28)
LA/AO Root Ratio: 1.61
LV E' Lateral Velocity: 9.01 cm/s
LV E' Septal Velocity: 6.6 cm/s
LV EDV A2C: 77.8 mL
LV EDV A4C: 129 mL
LV EDV BP: 105 ml — AB (ref 56–104)
LV EDV Index A2C: 41.4 mL/m2
LV EDV Index A4C: 68.6 mL/m2
LV EDV Index BP: 55.9 mL/m2
LV ESV A2C: 37.1 mL
LV ESV A4C: 50.2 mL
LV ESV BP: 45.3 mL (ref 19–49)
LV ESV Index A2C: 19.7 mL/m2
LV ESV Index A4C: 26.7 mL/m2
LV ESV Index BP: 24.1 mL/m2
LV Ejection Fraction A2C: 52 %
LV Ejection Fraction A4C: 61 %
LV Mass 2D Index: 212.8 g/m2 — AB (ref 43–95)
LV Mass 2D: 399.9 g — AB (ref 67–162)
LVIDd: 4.48 cm (ref 3.9–5.3)
LVIDs: 3.02 cm
LVOT Cardiac Output: 4.6 L/min
LVOT Diameter: 1.96 cm
LVOT Peak Gradient: 4.7 mmHg
LVOT Peak Velocity: 108.54 cm/s
LVOT SV: 60.1 ml
LVOT VTI: 19.97 cm
LVPWd: 1.6 cm — AB (ref 0.6–0.9)
Left Ventricular Ejection Fraction: 57
MV A Velocity: 129.47 cm/s
MV Area by PHT: 3.4 cm2
MV E Velocity: 94.25 cm/s
MV E Wave Deceleration Time: 223.6 ms
MV E/A: 0.73
MV PHT: 64.9 ms
RA Major Axis: 3.61 cm
RVIDd: 3.41 cm
TAPSE: 1.57 cm (ref 1.5–2)

## 2018-10-01 LAB — ECHO ADULT COMPLETE
AV Area by Peak Velocity: 1.3 cm2
AV Area by VTI: 1.3 cm2
AV Mean Gradient: 15.3 mmHg
AV Peak Gradient: 26.8 mmHg
AV Peak Velocity: 258.67 cm/s
AV VTI: 46.1 cm
AVA/BSA Peak Velocity: 0.7 cm2/m2
AVA/BSA VTI: 0.7 cm2/m2
Aortic Root: 2.71 cm
Ascending Aorta: 3.13 cm
E/E' Lateral: 10.46
E/E' Ratio (Averaged): 12.37
E/E' Septal: 14.28
EF BP: 56.8 % (ref 55–100)
Est. RA Pressure: 3 mmHg
IVSd: 1.78 cm — AB (ref 0.6–0.9)
LA Area 4C: 15.3 cm2
LA Major Axis: 4.37 cm
LA Volume 2C: 39.9 mL (ref 22–52)
LA Volume 4C: 40.95 mL (ref 22–52)
LA Volume BP: 42.64 mL (ref 22–52)
LA Volume Index 2C: 21.23 ml/m2 (ref 16–28)
LA Volume Index 4C: 21.79 ml/m2 (ref 16–28)
LA Volume Index BP: 22.69 ml/m2 (ref 16–28)
LA/AO Root Ratio: 1.61
LV E' Lateral Velocity: 9.01 cm/s
LV E' Septal Velocity: 6.6 cm/s
LV EDV A2C: 77.8 mL
LV EDV A4C: 129 mL
LV EDV BP: 105 ml — AB (ref 56–104)
LV EDV Index A2C: 41.4 mL/m2
LV EDV Index A4C: 68.6 mL/m2
LV EDV Index BP: 55.9 mL/m2
LV ESV A2C: 37.1 mL
LV ESV A4C: 50.2 mL
LV ESV BP: 45.3 mL (ref 19–49)
LV ESV Index A2C: 19.7 mL/m2
LV ESV Index A4C: 26.7 mL/m2
LV ESV Index BP: 24.1 mL/m2
LV Ejection Fraction A2C: 52 %
LV Ejection Fraction A4C: 61 %
LV Mass 2D Index: 212.8 g/m2 — AB (ref 43–95)
LV Mass 2D: 399.9 g — AB (ref 67–162)
LVIDd: 4.48 cm (ref 3.9–5.3)
LVIDs: 3.02 cm
LVOT Cardiac Output: 4.6 L/min
LVOT Diameter: 1.96 cm
LVOT Peak Gradient: 4.7 mmHg
LVOT Peak Velocity: 108.54 cm/s
LVOT SV: 60.1 ml
LVOT VTI: 19.97 cm
LVPWd: 1.6 cm — AB (ref 0.6–0.9)
MV A Velocity: 129.47 cm/s
MV Area by PHT: 3.4 cm2
MV E Velocity: 94.25 cm/s
MV E Wave Deceleration Time: 223.6 ms
MV E/A: 0.73
MV PHT: 64.9 ms
RA Major Axis: 3.61 cm
RVIDd: 3.41 cm
TAPSE: 1.57 cm (ref 1.5–2.0)

## 2018-10-01 NOTE — Progress Notes (Signed)
[facility]  [phone] [id number]    HPI: Kelsey [name], a 67 y.o. year-old who presents for follow up regarding her CAD.    She is feeling ok from the cardiac standpoint. She has had pain from her twisted rib and shortness of breath from that. But otherwise ok. She hurt it about 2 days ago, Couldn't get a breath with the twisting.   Having dizziness related to neck issues.   Has an upcoming left elbow calcified nodule that is hurting and infected, needs surgery on it.   Has fallen 3x in the last few months.   Had had bad constipation. When it breaks loose sometimes it takes a lot out of her and she will get weak, dizzy, fall, pass out. But is it better on the new medication.   Surgery is Dr. Herschel [place].   Living here a little longer.     DOE stable, exercise limited by hip an ortho pain.    She is weaker than she used to be. She does have a 100% RCA which may contribute but it is difficult to know how much is coronary.   Discussed her extensive plaque in the arteries of the body. .    Stable to proceed with upcoming surgery on 12/10. No progressive cad sx to warrant pci of cto RCA at this time. But potential future consideration. Needs elbow surgery more urgently.     HX claudication, ongoing bilateral calves, sees Dr. [name] now, angiogram by someone else resulted in an embolus to the right little toe.     Last visit worse dizziness and dyspnea. Dyspnea has stabilized,, dizziness better on lower coreg. Also has vertebral artery stenosis by MRA.   Not checking her glucose but a1C looking good.     Mild edema. Less active since her hip replacement earlier this year.   Not using CPAP, due to reassess with home sleep study.  Discussed the importance of treatment of her OSA   Dr. Fanny Skates handles pain management     She has complex vascular disease and multiple issues we reviewed today.   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 Dr. [name]  Progressive DOE, concerning for CAD progression, with complex CAD and CTO, last cath 8/17 no targets for intervention, sx have stabilized now  Progressive dizziness somewhat better after cutting coreg but still there.  Has had progression of carotid plaque, will as Vascular to review. CTA neck fu per Dr. [name]      Historic  She was [facility] at [facility] on 10/15/16 when she presented to ER with elevated BP, jaw/throat/facial tightness and dyspnea  She had just been started losartan on 10/14/16 for elevated BP  Her troponin bumped to 0.71 and she underwent another cardiac cath which did not show any new CAD, no PCI performed  She was discharged but then came back to ER with right groin hematoma and acute anemia with Hgb 6.9, hypotension and dyspnea  She received 2 units of PRBCs, groin [place] was negative, right leg venous duplex was negative and she was discharged on 10/21/16 and her Hgb was 9.2 at the time  Her angina equivalent was jaw pain in the past  Has balance issues, on gabapentin for neuropathy, walks with a cane  Has had short term memory issues since her CABG/AVR  Difficulty with word finding, cognitive abilities are more limited  Has  a hard time remembering people's names  Advised to follow back up with neurology previously     Good friend of [name] who passed away, former patient here  Her wife is here with her today     Assessment/Plan:  1.  CAD - hx of MI and PCI x 3, had 3 vessel CABG 06/08/2012 by Dr. Myles Gip, s/p PCI/DES to diagonal 08/29/16, no progression of CAD on cardiac cath 10/17  -continue ASA, Plavix, Coreg and statin  -has Rx for NTG SL tabs PRN chest pain  2.  Severe AS - s/p bioprosthetic bovine AVR 06/08/2012, stable by TTE 10/18   3.  Carotid stenosis - bilateral, progressive, on ASA and statin, get CTA  4.  PAD s/p stent in distal right SFA - concern for progression, more claudication, needs vascular eval and CTA with runoff  5.   Dyslipidemia - at goal in 4/18, on zetia and pravastatin 62m daily, had myalgias on fenofibrate  -NMR at goal  6.  DM Type 2 - Hgb A1c 6.o0, on insulin doing well  7.  HTN - hypotensive, last visit, cut down on coreg 12.54mBID and now bp is better but still has dyspnea, fatigue, claudication and dizziness  8.  Chronic pain/arthritis - s/p hip surgery Dr. Kump/orthopedics  9.  Vitamin D deficiency - on Vitamin D 1000 units daily   10.  RBBB - chronic  11.  Hyponatremia - chronic, stable  12.  Anemia - stable  13.  Enlarged aorta on exam -+ atherosclerosis per Dr. LeElon Spanner14.  Dyspnea/edema - repeat cath for sx progression    Echo 10/18 gr1dd, EF57% mild AS AVR, mild mr, mac  Aortic duplex 2/18 - aorta normal in size, no aneurysm  ABI 2/18 - right 0.6, left 0.7    Cardiac Cath 10/17 -LCA:  LAD: 30% proximal after large D1, with competitive inflow from LIMA to mid LAD, D1 recently stented widely patent, LCX: 80% after small MOM, before large PLOM which has competitive SVG inflow, RCA: occluded mid, LIMA --> LAD widely patent unobstructed anasomosis, SVG --> PLOM widely patent, SVG --> LVBr of distal RCA widely patent, moderate disease proximal to SVG insertion, yielding ischemic potential in small PDA (similar to cath 6 weeks ago, not fixable and VERY unlikely to be problematic).  IMP: CAD post PCI, CABG with very limited ischemic potential  Cardiac Cath 9/17 - Patent LIMA to LAD, SVG's to RCA and LCX.  Very large diagonal has eccentric 90% stenosis, treated with 2.75 x 12 Xience DES good result.  Bioprosthetic AV not crossed given recent echo.  Echo 8/17 - LVEF 55-60%, no WMA, wall thickness was increased, dilated LA, atrial septum thickened, mildly dilated RA, mild MR  Carotid Duplex 8/17 -  50-79% R ICA stenosis, 10-49% stenosis of the L ICA, >50% stenosis of the left ECA  ABI 12/16 - right 0.82 and left 0.88  Echo 6/16 - LVEF 55%, no WMA, bovine AVR with normal function, no AI, mild AS, no pericardial effusion,  grd 1 dd, MAC with calcified chords noted with mild sclerosis, mild MR, trace TR  Carotid duplex 6/16 - right 50-69% prox ICA stenosis with moderate hemodynamic compromise, left 1-49% prox ICA stenosis with mild hemodynamic compromise, no change compared to 2014  ABI 12/15 - right 0.70 and left 0.88  Peripheral angiogram 5/15 - stent in distal right SFA, stent in left common iliac artery  ABI 5/15 - right 0.68 and left 0.88  Abd CTA 5/15 - significant PVD, 80% proximal stenosis in celiac plexus, abdominal aorta with diffuse calcification and mural thrombus, mild disease in right and left renal arteries, moderate right iliac disease with CFA stenosis, SFA with moderate disease, left common iliac with ulcerated plaque proximally, moderate to severe disease in iliac system, SFA with moderate to severe diffuse disease  Echo 09/2012 - LVEF 45-50%, AVR ok, mild MR, mild TR  06/08/12 -CABG x 3 (LIMA to LAD, SVG to OM1, SVG to PLB) and 21 mm [facility] Ease pericardial prosthesis at [facility]  Cardiac Cath 06/04/2012 - mod AS (60m gradient), mod AI, diffuse 3 vessel CAD  Cardiac Cath 05/2011 - LVEF 30%, occluded mid RCA, high grade stenosis distal LCx, mild AS s/p PCI/DES to LCx by Dr. CCurrie Paris Cardiac Cath 02/2009 - patent stents in RCA and LCx  PCI to LCx 2001  MI/PCI to RCA 2000    Soc Hx: smokes 1/4 ppd, drinks 2 glasses of wine/month, no drug use  Fam Hx: father had CVA at age 68 had a couple of MIs in his 554sand had arteries replaced with teflon, mother passed at age 1131from aortic aneurysm, brother passed age 6270from sepsis and had bad PVD with multiple bypass surgeries    She  has a past medical history of [place], Arthritis, CAD (coronary artery disease), Cervical spondylolysis, Chronic pain, COPD, Diabetes ([place], GERD (gastroesophageal reflux disease), Goiter (04/20/2012), Hypertension, and Neuropathy (04/06/2013).    Cardiovascular ROS: no chest pain, positive for dyspnea, edema, dizziness and fatigue    Respiratory ROS: no cough or wheezing  Neurological ROS: no TIA or stroke symptoms  All other systems negative except as above.     PE  Vitals:    10/01/18 1209   BP: 122/80   Pulse: 76   Resp: 16   SpO2: 97%   Weight: 182 lb 9.6 oz (82.8 kg)   Height: _0  (1.626 m)    Body mass index is 31.34 kg/m??.   General appearance - alert, well appearing, and in no distress  Mental status - affect appropriate to mood  Eyes - sclera anicteric, moist mucous membranes  Neck - supple, bilateral carotid bruits   Lymphatics - not assessed  Chest - clear to auscultation, no wheezes, rales or rhonchi  Heart - normal rate, regular rhythm, normal S1, S2, 3/6 SEM    Abdomen - soft, nontender, nondistended, aorta feels enlarged   Back exam - full range of motion, no tenderness  Neurological - cranial nerves II through XII grossly intact, no focal deficit  Musculoskeletal - no muscular tenderness noted, normal strength  Extremities - peripheral pulses diminished, 1+ LE edema (left > right)    Skin - normal coloration  no rashes    12 lead ECG: NSR with RBBB, first degree AV block, non-specific ST-T wave changes     Recent Labs:  Lab Results   Component Value Date/Time    Cholesterol, total 90 10/16/2016 03:36 AM    Cholesterol, Total 122 09/23/2017 12:13 PM    HDL Cholesterol 46 10/16/2016 03:36 AM    LDL, calculated 18.2 10/16/2016 03:36 AM    Triglyceride 129 10/16/2016 03:36 AM    CHOL/HDL Ratio 2.0 10/16/2016 03:36 AM     Lab Results   Component Value Date/Time    Creatinine 1.05 (H) 04/21/2018 11:07 AM     Lab Results   Component Value Date/Time    BUN 18 04/21/2018 11:07 AM  Lab Results   Component Value Date/Time    Potassium 4.6 04/21/2018 11:07 AM     No results found for: HBA1C, HGBE8, HBA1CEXT, HBA1CEXT  Lab Results   Component Value Date/Time    HGB 12.9 02/22/2018 11:28 AM     Lab Results   Component Value Date/Time    PLATELET 249 02/22/2018 11:28 AM       Reviewed:  Past Medical History:   Diagnosis Date   ??? Acute MI  ([place])     x3   ??? Arthritis    ??? CAD (coronary artery disease)    ??? Cervical spondylolysis    ??? Chronic pain    ??? COPD    ??? Diabetes ([place])    ??? GERD (gastroesophageal reflux disease)    ??? Goiter 04/20/2012   ??? Hypertension    ??? Neuropathy 04/06/2013     Social History     Tobacco Use   Smoking Status Former Smoker   ??? Packs/day: 0.30   ??? Years: 45.00   ??? Pack years: 13.50   ??? Types: Cigarettes   Smokeless Tobacco Never Used   Tobacco Comment    quit around the first of November     Social History     Substance and Sexual Activity   Alcohol Use Yes   ??? Frequency: Monthly or less   ??? Drinks per session: 1 or 2   ??? Binge frequency: Never    Comment: may be 3 times a year if that     Allergies   Allergen Reactions   ??? Morphine Anaphylaxis     Tolerates oxycodone without problem   ??? Soap Rash   ??? Avalide [Irbesartan-Hydrochlorothiazide] Myalgia   ??? Betadine [Povidone-Iodine] Rash   ??? Demerol [Meperidine] Other (comments)     Makes her feel crazy   ??? Pcn [Penicillins] Swelling   ??? Prinivil [Lisinopril] Cough   ??? Statins-Hmg-Coa Reductase Inhibitors Myalgia   ??? Sulfa (Sulfonamide Antibiotics) Unknown (comments)   ??? Tricor [Fenofibrate Micronized] Myalgia       Current Outpatient Medications   Medication Sig   ??? HYDROcodone-acetaminophen (NORCO) 7.5-325 mg per tablet Take 1 Tab by mouth every eight (8) hours as needed for Pain.   ??? doxycycline (ADOXA) 100 mg tablet Take 100 mg by mouth two (2) times a day.   ??? lubiPROStone (AMITIZA) 24 mcg capsule Take 24 mcg by mouth two (2) times daily (with meals).   ??? cholecalciferol (VITAMIN D3) 2,000 unit cap capsule TAKE 1 CAPSULE BY MOUTH EVERY DAY   ??? carvedilol (COREG) 12.5 mg tablet TAKE 1 TABLET BY MOUTH TWICE A DAY (Patient taking differently: 25 mg.)   ??? famotidine (PEPCID) 40 mg tablet TAKE 1 TABLET BY MOUTH TWICE A DAY   ??? losartan (COZAAR) 25 mg tablet TAKE 1 TAB BY MOUTH DAILY.   ??? CHANTIX CONTINUING MONTH BOX 1 mg tablet TAKE 1 TABLET BY MOUTH TWICE A DAY   ??? pantoprazole  (PROTONIX) 40 mg tablet TAKE 1 TABLET BY MOUTH DAILY   ??? aspirin delayed-release 81 mg tablet TAKE 1 TAB BY MOUTH DAILY.   ??? DULoxetine (CYMBALTA) 60 mg capsule TAKE 1 CAPSULE BY MOUTH DAILY   ??? clopidogrel (PLAVIX) 75 mg tab TAKE 1 TABLET BY MOUTH DAILY   ??? metFORMIN ER (GLUCOPHAGE XR) 500 mg tablet TAKE 2 TABLETS BY MOUTH IN THE MORNING WITH BREAKFAST AND 3 TABLETS BY MOUTH AT BEDTIME (Patient taking differently: Reports 2 tabs am & pm)   ???  ezetimibe (ZETIA) 10 mg tablet TAKE 1 TAB BY MOUTH NIGHTLY.   ??? LEVEMIR FLEXTOUCH U-100 INSULN 100 unit/mL (3 mL) inpn INJECT 34 UNITS UNDER THE SKIN NIGHTLY AT BEDTIME   ??? magnesium oxide (MAG-OX) 400 mg tablet Take 1 Tab by mouth daily.   ??? insulin lispro (HUMALOG U-100 INSULIN) 100 unit/mL injection by SubCUTAneous route. 0-8 units three times a day as needed   ??? VESICARE 10 mg tablet TAKE 1 TABLET BY MOUTH DAILY.   ??? gabapentin (NEURONTIN) 300 mg capsule Take 1,200 mg by mouth daily.   ??? acetaminophen (ACETAMINOPHEN EXTRA STRENGTH) 500 mg tablet Take 1,000 mg by mouth every six (6) hours. With tramadol   ??? nitroglycerin (NITROSTAT) 0.4 mg SL tablet 1 Tab by SubLINGual route every five (5) minutes as needed for Chest Pain for up to 3 doses.   ??? bumetanide (BUMEX) 2 mg tablet Take 2 mg by mouth daily.   ??? nabumetone (RELAFEN) 500 mg tablet TAKE 1 TAB BY MOUTH TWO (2) TIMES A DAY.   ??? linaclotide (LINZESS PO) Take  by mouth.   ??? lidocaine (LIDODERM) 5 % Apply patch to the affected area for 12 hours a day and remove for 12 hours a day.     No current facility-administered medications for this visit.        Telford Nab, MD  [facility] [place], [address]  [place], [place]  [phone]

## 2018-10-01 NOTE — Progress Notes (Signed)
[facility]  [phone]    HPI: Kelsey [name], a 67 y.o. year-old who presents for follow up regarding her CAD.    She is feeling ok from the cardiac standpoint. She has had pain from her twisted rib and shortness of breath from that. But otherwise ok. She hurt it about 2 days ago, Couldn't get a breath with the twisting.   Having dizziness related to neck issues.   Has an upcoming left elbow calcified nodule that is hurting and infected, needs surgery on it.   Has fallen 3x in the last few months.   Had had bad constipation. When it breaks loose sometimes it takes a lot out of her and she will get weak, dizzy, fall, pass out. But is it better on the new medication.   Surgery is Dr. Herschel [place].   Living here a little longer.     DOE stable, exercise limited by hip an ortho pain.    She is weaker than she used to be. She does have a 100% RCA which may contribute but it is difficult to know how much is coronary.   Discussed her extensive plaque in the arteries of the body. .    Stable to proceed with upcoming surgery on 12/10. No progressive cad sx to warrant pci of cto RCA at this time. But potential future consideration. Needs elbow surgery more urgently.     HX claudication, ongoing bilateral calves, sees Dr. [name] now, angiogram by someone else resulted in an embolus to the right little toe.     Last visit worse dizziness and dyspnea. Dyspnea has stabilized,, dizziness better on lower coreg. Also has vertebral artery stenosis by MRA.   Not checking her glucose but a1C looking good.     Mild edema. Less active since her hip replacement earlier this year.   Not using CPAP, due to reassess with home sleep study.  Discussed the importance of treatment of her OSA   Dr. Fanny Skates handles pain management     She has complex vascular disease and multiple issues we reviewed today.   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 Dr. [name]  Progressive DOE, concerning for CAD progression, with complex CAD and CTO, last cath 8/17 no targets for intervention, sx have stabilized now  Progressive dizziness somewhat better after cutting coreg but still there.  Has had progression of carotid plaque, will as Vascular to review. CTA neck fu per Dr. [name]      Historic  She was hospitalized at [facility] on 10/15/16 when she presented to ER with elevated BP, jaw/throat/facial tightness and dyspnea  She had just been started losartan on 10/14/16 for elevated BP  Her troponin bumped to 0.71 and she underwent another cardiac cath which did not show any new CAD, no PCI performed  She was discharged but then came back to ER with right groin hematoma and acute anemia with Hgb 6.9, hypotension and dyspnea  She received 2 units of PRBCs, groin [place] was negative, right leg venous duplex was negative and she was discharged on 10/21/16 and her Hgb was 9.2 at the time  Her angina equivalent was jaw pain in the past  Has balance issues, on gabapentin for neuropathy, walks with a cane  Has had short term memory issues since her CABG/AVR  Difficulty with word finding, cognitive abilities are more limited  Has  a hard time remembering people's names  Advised to follow back up with neurology previously     Good friend of [name] who passed away, former patient here  Her wife is here with her today     Assessment/Plan:  1.  CAD - hx of MI and PCI x 3, had 3 vessel CABG 06/08/2012 by Dr. Myles Gip, s/p PCI/DES to diagonal 08/29/16, no progression of CAD on cardiac cath 10/17  -continue ASA, Plavix, Coreg and statin  -has Rx for NTG SL tabs PRN chest pain  2.  Severe AS - s/p bioprosthetic bovine AVR 06/08/2012, stable by TTE 10/18   3.  Carotid stenosis - bilateral, progressive, on ASA and statin, get CTA  4.  PAD s/p stent in distal right SFA - concern for progression, more  claudication, needs vascular eval and CTA with runoff  5.  Dyslipidemia - at goal in 4/18, on zetia and pravastatin 12m daily, had myalgias on fenofibrate  -NMR at goal  6.  DM Type 2 - Hgb A1c 6.o0, on insulin doing well  7.  HTN - hypotensive, last visit, cut down on coreg 12.536mBID and now bp is better but still has dyspnea, fatigue, claudication and dizziness  8.  Chronic pain/arthritis - s/p hip surgery Dr. Kump/orthopedics  9.  Vitamin D deficiency - on Vitamin D 1000 units daily   10.  RBBB - chronic  11.  Hyponatremia - chronic, stable  12.  Anemia - stable  13.  Enlarged aorta on exam -+ atherosclerosis per Dr. LeElon Spanner14.  Dyspnea/edema - repeat cath for sx progression    Echo 10/18 gr1dd, EF57% mild AS AVR, mild mr, mac  Aortic duplex 2/18 - aorta normal in size, no aneurysm  ABI 2/18 - right 0.6, left 0.7    Cardiac Cath 10/17 -LCA:  LAD: 30% proximal after large D1, with competitive inflow from LIMA to mid LAD, D1 recently stented widely patent, LCX: 80% after small MOM, before large PLOM which has competitive SVG inflow, RCA: occluded mid, LIMA --> LAD widely patent unobstructed anasomosis, SVG --> PLOM widely patent, SVG --> LVBr of distal RCA widely patent, moderate disease proximal to SVG insertion, yielding ischemic potential in small PDA (similar to cath 6 weeks ago, not fixable and VERY unlikely to be problematic).  IMP: CAD post PCI, CABG with very limited ischemic potential  Cardiac Cath 9/17 - Patent LIMA to LAD, SVG's to RCA and LCX.  Very large diagonal has eccentric 90% stenosis, treated with 2.75 x 12 Xience DES good result.  Bioprosthetic AV not crossed given recent echo.  Echo 8/17 - LVEF 55-60%, no WMA, wall thickness was increased, dilated LA, atrial septum thickened, mildly dilated RA, mild MR  Carotid Duplex 8/17 -  50-79% R ICA stenosis, 10-49% stenosis of the L ICA, >50% stenosis of the left ECA  ABI 12/16 - right 0.82 and left 0.88   Echo 6/16 - LVEF 55%, no WMA, bovine AVR with normal function, no AI, mild AS, no pericardial effusion, grd 1 dd, MAC with calcified chords noted with mild sclerosis, mild MR, trace TR  Carotid duplex 6/16 - right 50-69% prox ICA stenosis with moderate hemodynamic compromise, left 1-49% prox ICA stenosis with mild hemodynamic compromise, no change compared to 2014  ABI 12/15 - right 0.70 and left 0.88  Peripheral angiogram 5/15 - stent in distal right SFA, stent in left common iliac artery  ABI 5/15 - right 0.68 and left 0.88  Abd CTA 5/15 - significant PVD, 80% proximal stenosis in celiac plexus, abdominal aorta with diffuse calcification and mural thrombus, mild disease in right and left renal arteries, moderate right iliac disease with CFA stenosis, SFA with moderate disease, left common iliac with ulcerated plaque proximally, moderate to severe disease in iliac system, SFA with moderate to severe diffuse disease  Echo 09/2012 - LVEF 45-50%, AVR ok, mild MR, mild TR  06/08/12 -CABG x 3 (LIMA to LAD, SVG to OM1, SVG to PLB) and 21 mm [facility] Ease pericardial prosthesis at [facility]  Cardiac Cath 06/04/2012 - mod AS (24m gradient), mod AI, diffuse 3 vessel CAD  Cardiac Cath 05/2011 - LVEF 30%, occluded mid RCA, high grade stenosis distal LCx, mild AS s/p PCI/DES to LCx by Dr. CCurrie Paris Cardiac Cath 02/2009 - patent stents in RCA and LCx  PCI to LCx 2001  MI/PCI to RCA 2000    Soc Hx: smokes 1/4 ppd, drinks 2 glasses of wine/month, no drug use  Fam Hx: father had CVA at age 56276 had a couple of MIs in his 57sand had arteries replaced with teflon, mother passed at age 5639from aortic aneurysm, brother passed age 7865from sepsis and had bad PVD with multiple bypass surgeries    She  has a past medical history of [place], Arthritis, CAD (coronary artery disease), Cervical spondylolysis, Chronic pain, COPD, [place], GERD (gastroesophageal reflux disease), Goiter  (04/20/2012), Hypertension, and Neuropathy (04/06/2013).    Cardiovascular ROS: no chest pain, positive for dyspnea, edema, dizziness and fatigue   Respiratory ROS: no cough or wheezing  Neurological ROS: no TIA or stroke symptoms  All other systems negative except as above.     PE  Vitals:    10/01/18 1209   BP: 122/80   Pulse: 76   Resp: 16   SpO2: 97%   Weight: 182 lb 9.6 oz (82.8 kg)   Height: 5' 4"  (1.626 m)    Body mass index is 31.34 kg/m??.   General appearance - alert, well appearing, and in no distress  Mental status - affect appropriate to mood  Eyes - sclera anicteric, moist mucous membranes  Neck - supple, bilateral carotid bruits   Lymphatics - not assessed  Chest - clear to auscultation, no wheezes, rales or rhonchi  Heart - normal rate, regular rhythm, normal S1, S2, 3/6 SEM    Abdomen - soft, nontender, nondistended, aorta feels enlarged   Back exam - full range of motion, no tenderness  Neurological - cranial nerves II through XII grossly intact, no focal deficit  Musculoskeletal - no muscular tenderness noted, normal strength  Extremities - peripheral pulses diminished, 1+ LE edema (left > right)    Skin - normal coloration  no rashes    12 lead ECG: NSR with RBBB, first degree AV block, non-specific ST-T wave changes     Recent Labs:  Lab Results   Component Value Date/Time    Cholesterol, total 90 10/16/2016 03:36 AM    Cholesterol, Total 122 09/23/2017 12:13 PM    HDL Cholesterol 46 10/16/2016 03:36 AM    LDL, calculated 18.2 10/16/2016 03:36 AM    Triglyceride 129 10/16/2016 03:36 AM    CHOL/HDL Ratio 2.0 10/16/2016 03:36 AM     Lab Results   Component Value Date/Time    Creatinine 1.05 (H) 04/21/2018 11:07 AM     Lab Results   Component Value Date/Time    BUN 18 04/21/2018 11:07 AM  Lab Results   Component Value Date/Time    Potassium 4.6 04/21/2018 11:07 AM     No results found for: HBA1C, HGBE8, HBA1CEXT, HBA1CEXT  Lab Results   Component Value Date/Time    HGB 12.9 02/22/2018 11:28 AM      Lab Results   Component Value Date/Time    PLATELET 249 02/22/2018 11:28 AM       Reviewed:  Past Medical History:   Diagnosis Date   ??? Acute MI (HCC)     x3   ??? Arthritis    ??? CAD (coronary artery disease)    ??? Cervical spondylolysis    ??? Chronic pain    ??? COPD    ??? Diabetes (HCC)    ??? GERD (gastroesophageal reflux disease)    ??? Goiter 04/20/2012   ??? Hypertension    ??? Neuropathy 04/06/2013     Social History     Tobacco Use   Smoking Status Former Smoker   ??? Packs/day: 0.30   ??? Years: 45.00   ??? Pack years: 13.50   ??? Types: Cigarettes   Smokeless Tobacco Never Used   Tobacco Comment    quit around the first of November     Social History     Substance and Sexual Activity   Alcohol Use Yes   ??? Frequency: Monthly or less   ??? Drinks per session: 1 or 2   ??? Binge frequency: Never    Comment: may be 3 times a year if that     Allergies   Allergen Reactions   ??? Morphine Anaphylaxis     Tolerates oxycodone without problem   ??? Soap Rash   ??? Avalide [Irbesartan-Hydrochlorothiazide] Myalgia   ??? Betadine [Povidone-Iodine] Rash   ??? Demerol [Meperidine] Other (comments)     Makes her feel crazy   ??? Pcn [Penicillins] Swelling   ??? Prinivil [Lisinopril] Cough   ??? Statins-Hmg-Coa Reductase Inhibitors Myalgia   ??? Sulfa (Sulfonamide Antibiotics) Unknown (comments)   ??? Tricor [Fenofibrate Micronized] Myalgia       Current Outpatient Medications   Medication Sig   ??? HYDROcodone-acetaminophen (NORCO) 7.5-325 mg per tablet Take 1 Tab by mouth every eight (8) hours as needed for Pain.   ??? doxycycline (ADOXA) 100 mg tablet Take 100 mg by mouth two (2) times a day.   ??? lubiPROStone (AMITIZA) 24 mcg capsule Take 24 mcg by mouth two (2) times daily (with meals).   ??? cholecalciferol (VITAMIN D3) 2,000 unit cap capsule TAKE 1 CAPSULE BY MOUTH EVERY DAY   ??? carvedilol (COREG) 12.5 mg tablet TAKE 1 TABLET BY MOUTH TWICE A DAY (Patient taking differently: 25 mg.)   ??? famotidine (PEPCID) 40 mg tablet TAKE 1 TABLET BY MOUTH TWICE A DAY    ??? losartan (COZAAR) 25 mg tablet TAKE 1 TAB BY MOUTH DAILY.   ??? CHANTIX CONTINUING MONTH BOX 1 mg tablet TAKE 1 TABLET BY MOUTH TWICE A DAY   ??? pantoprazole (PROTONIX) 40 mg tablet TAKE 1 TABLET BY MOUTH DAILY   ??? aspirin delayed-release 81 mg tablet TAKE 1 TAB BY MOUTH DAILY.   ??? DULoxetine (CYMBALTA) 60 mg capsule TAKE 1 CAPSULE BY MOUTH DAILY   ??? clopidogrel (PLAVIX) 75 mg tab TAKE 1 TABLET BY MOUTH DAILY   ??? metFORMIN ER (GLUCOPHAGE XR) 500 mg tablet TAKE 2 TABLETS BY MOUTH IN THE MORNING WITH BREAKFAST AND 3 TABLETS BY MOUTH AT BEDTIME (Patient taking differently: Reports 2 tabs am & pm)   ???  ezetimibe (ZETIA) 10 mg tablet TAKE 1 TAB BY MOUTH NIGHTLY.   ??? LEVEMIR FLEXTOUCH U-100 INSULN 100 unit/mL (3 mL) inpn INJECT 34 UNITS UNDER THE SKIN NIGHTLY AT BEDTIME   ??? magnesium oxide (MAG-OX) 400 mg tablet Take 1 Tab by mouth daily.   ??? insulin lispro (HUMALOG U-100 INSULIN) 100 unit/mL injection by SubCUTAneous route. 0-8 units three times a day as needed   ??? VESICARE 10 mg tablet TAKE 1 TABLET BY MOUTH DAILY.   ??? gabapentin (NEURONTIN) 300 mg capsule Take 1,200 mg by mouth daily.   ??? acetaminophen (ACETAMINOPHEN EXTRA STRENGTH) 500 mg tablet Take 1,000 mg by mouth every six (6) hours. With tramadol   ??? nitroglycerin (NITROSTAT) 0.4 mg SL tablet 1 Tab by SubLINGual route every five (5) minutes as needed for Chest Pain for up to 3 doses.   ??? bumetanide (BUMEX) 2 mg tablet Take 2 mg by mouth daily.   ??? nabumetone (RELAFEN) 500 mg tablet TAKE 1 TAB BY MOUTH TWO (2) TIMES A DAY.   ??? linaclotide (LINZESS PO) Take  by mouth.   ??? lidocaine (LIDODERM) 5 % Apply patch to the affected area for 12 hours a day and remove for 12 hours a day.     No current facility-administered medications for this visit.        Telford Nab, MD  [facility], [address]  [place], [place]  [phone]

## 2018-10-04 ENCOUNTER — Inpatient Hospital Stay: Admit: 2018-10-04 | Payer: MEDICARE | Primary: Internal Medicine

## 2018-10-04 DIAGNOSIS — Z01818 Encounter for other preprocedural examination: Secondary | ICD-10-CM

## 2018-10-04 LAB — CBC WITH AUTO DIFFERENTIAL
Basophils %: 1 % (ref 0–1)
Basophils Absolute: 0.1 10*3/uL (ref 0.0–0.1)
Eosinophils %: 2 % (ref 0–7)
Eosinophils Absolute: 0.2 10*3/uL (ref 0.0–0.4)
Granulocyte Absolute Count: 0 10*3/uL (ref 0.00–0.04)
Hematocrit: 36.4 % (ref 35.0–47.0)
Hemoglobin: 11.3 g/dL — ABNORMAL LOW (ref 11.5–16.0)
Immature Granulocytes: 0 % (ref 0.0–0.5)
Lymphocytes %: 22 % (ref 12–49)
Lymphocytes Absolute: 2.3 10*3/uL (ref 0.8–3.5)
MCH: 27.8 PG (ref 26.0–34.0)
MCHC: 31 g/dL (ref 30.0–36.5)
MCV: 89.4 FL (ref 80.0–99.0)
MPV: 9.8 FL (ref 8.9–12.9)
Monocytes %: 6 % (ref 5–13)
Monocytes Absolute: 0.6 10*3/uL (ref 0.0–1.0)
NRBC Absolute: 0 10*3/uL (ref 0.00–0.01)
Neutrophils %: 69 % (ref 32–75)
Neutrophils Absolute: 7.3 10*3/uL (ref 1.8–8.0)
Nucleated RBCs: 0 PER 100 WBC
Platelets: 387 10*3/uL (ref 150–400)
RBC: 4.07 M/uL (ref 3.80–5.20)
RDW: 13.9 % (ref 11.5–14.5)
WBC: 10.5 10*3/uL (ref 3.6–11.0)

## 2018-10-04 LAB — BASIC METABOLIC PANEL
Anion Gap: 9 mmol/L (ref 5–15)
BUN: 26 MG/DL — ABNORMAL HIGH (ref 6–20)
Bun/Cre Ratio: 28 — ABNORMAL HIGH (ref 12–20)
CO2: 27 mmol/L (ref 21–32)
Calcium: 9.6 MG/DL (ref 8.5–10.1)
Chloride: 100 mmol/L (ref 97–108)
Creatinine: 0.92 MG/DL (ref 0.55–1.02)
EGFR IF NonAfrican American: >60 mL/min/{1.73_m2} (ref >60)
GFR African American: >60 mL/min/{1.73_m2} (ref >60)
Glucose: 112 mg/dL — ABNORMAL HIGH (ref 65–100)
Potassium: 4.1 mmol/L (ref 3.5–5.1)
Sodium: 136 mmol/L (ref 136–145)

## 2018-10-04 LAB — HEMOGLOBIN A1C W/EAG
Hemoglobin A1C: 6.4 % — ABNORMAL HIGH (ref 4.2–6.3)
eAG: 137 mg/dL

## 2018-10-04 LAB — HEMOGLOBIN A1C WITH EAG
Est. average glucose: 137 mg/dL
Hemoglobin A1c: 6.4 % — ABNORMAL HIGH (ref 4.2–6.3)

## 2018-10-04 LAB — CBC WITH AUTOMATED DIFF
ABS. BASOPHILS: 0.1 10*3/uL (ref 0.0–0.1)
ABS. EOSINOPHILS: 0.2 10*3/uL (ref 0.0–0.4)
ABS. IMM. GRANS.: 0 10*3/uL (ref 0.00–0.04)
ABS. LYMPHOCYTES: 2.3 10*3/uL (ref 0.8–3.5)
ABS. MONOCYTES: 0.6 10*3/uL (ref 0.0–1.0)
ABS. NEUTROPHILS: 7.3 10*3/uL (ref 1.8–8.0)
ABSOLUTE NRBC: 0 10*3/uL (ref 0.00–0.01)
BASOPHILS: 1 % (ref 0–1)
EOSINOPHILS: 2 % (ref 0–7)
HCT: 36.4 % (ref 35.0–47.0)
HGB: 11.3 g/dL — ABNORMAL LOW (ref 11.5–16.0)
IMMATURE GRANULOCYTES: 0 % (ref 0.0–0.5)
LYMPHOCYTES: 22 % (ref 12–49)
MCH: 27.8 PG (ref 26.0–34.0)
MCHC: 31 g/dL (ref 30.0–36.5)
MCV: 89.4 FL (ref 80.0–99.0)
MONOCYTES: 6 % (ref 5–13)
MPV: 9.8 FL (ref 8.9–12.9)
NEUTROPHILS: 69 % (ref 32–75)
NRBC: 0 PER 100 WBC
PLATELET: 387 10*3/uL (ref 150–400)
RBC: 4.07 M/uL (ref 3.80–5.20)
RDW: 13.9 % (ref 11.5–14.5)
WBC: 10.5 10*3/uL (ref 3.6–11.0)

## 2018-10-04 LAB — METABOLIC PANEL, BASIC
Anion gap: 9 mmol/L (ref 5–15)
BUN/Creatinine ratio: 28 — ABNORMAL HIGH (ref 12–20)
BUN: 26 MG/DL — ABNORMAL HIGH (ref 6–20)
CO2: 27 mmol/L (ref 21–32)
Calcium: 9.6 MG/DL (ref 8.5–10.1)
Chloride: 100 mmol/L (ref 97–108)
Creatinine: 0.92 MG/DL (ref 0.55–1.02)
GFR est AA: >60 mL/min/{1.73_m2} (ref >60)
GFR est non-AA: >60 mL/min/{1.73_m2} (ref >60)
Glucose: 112 mg/dL — ABNORMAL HIGH (ref 65–100)
Potassium: 4.1 mmol/L (ref 3.5–5.1)
Sodium: 136 mmol/L (ref 136–145)

## 2018-10-04 NOTE — Interval H&P Note (Signed)
Patient given surgical site infection information FAQs handout and hand hygiene tips sheet.Pre-operative instructions reviewed and patient verbalizes understanding of instructions. Patient has been given the opportunity to ask additional questions.PATIENT GIVEN 2 BOTTLES OF CHG SOAP TO USE DAY BEFORE SURGERY AND DOS. INSTRUCTIONS PROVIDED.PT ADVISED TO NOT EAT SOLID FOODS AFTER MIDNIGHT THE NIGHT BEFORE SURGERY INCLUDING CANDY,MINTS OR GUM. DO NOT DRINK ALCOHOL 24 HOURS BEFORE SURGERY. PT MAY DRINK CLEAR LIQUIDS FROM 12 MIDNIGHT UNTIL 1 HOUR PRIOR TO ARRIVAL.

## 2018-10-04 NOTE — H&P (Signed)
CARE TEAM:  Patient Care Team:  Kelsey Fearing, MD as PCP - General (Internal Medicine)  ??  ASSESSMENT:  1. Septic olecranon bursitis of left elbow    2. Open wound of left elbow, initial encounter       ??      Patient Active Problem List   Diagnosis   ??? Atherosclerosis of aorta   ??? CAD (coronary artery disease)   ??? Coronary atherosclerosis of native coronary artery   ??? Hypertension complicating diabetes   ??? Mixed hyperlipidemia   ??? Type 2 diabetes mellitus with nephropathy   ??? Primary osteoarthritis of left hip   ??? Primary localized osteoarthrosis of the knee, right   ??? H/O total hip arthroplasty, left   ??  ??  PLAN:  Treatment Plan:  Believe she has septic olecranon bursitis.  We will put her back on p.o. antibiotics.  we will schedule her for a bursectomy.  She will need to be off of her anticoagulation preoperatively.  ??      Orders Placed This Encounter   ??? X-ray elbow left 3+ views (19147)   ??? BMI >=25 PATIENT INSTRUCTIONS & EDUCATION   ??? doxycycline (VIBRAMYCIN) 100 MG capsule      Follow-up: Return for first postoperative visit.   ??  HISTORY OF PRESENT ILLNESS:  Chief Complaint: Pain of the Left Elbow     Age: 67 y.o.  Sex: female   History of present illness: Kelsey [name] ??is a pleasant 67 y.o. female who presents today for evaluation of left elbow pain and swelling.  She was seen by dermatologist about 3 weeks ago with a small painful nodule on the dorsal aspect of the proximal forearm.  She had this biopsied by her dermatologist.  Several days later she noticed that the biopsy site had opened.  Her sutures had come out.  She had some drainage.  She had Steri-Strips applied.  She was then diagnosed with the infection several days later we.  She had a culture which revealed sensitive Staph.  She was put on doxycycline.  This provided some relief.  She continues to have drainage from the wound.  She now has developed worsening swelling and pain more proximally around the elbow posteriorly.  Denies history of  gout.  She does have a history of diabetes.  She has a history of multiple MIs with stents.  She is on Plavix.  She has been told in the past she can come off of her Plavix for surgical management.  ??  Past medical history, past surgical history, medications, allergies, social history, and review of systems have been reviewed by me.    No current facility-administered medications on file prior to encounter.      Current Outpatient Medications on File Prior to Encounter   Medication Sig Dispense Refill   ??? cholecalciferol (VITAMIN D3) 2,000 unit cap capsule TAKE 1 CAPSULE BY MOUTH EVERY DAY 90 Cap 3   ??? carvedilol (COREG) 12.5 mg tablet TAKE 1 TABLET BY MOUTH TWICE A DAY (Patient taking differently: 25 mg.) 180 Tab 0   ??? famotidine (PEPCID) 40 mg tablet TAKE 1 TABLET BY MOUTH TWICE A DAY 60 Tab 1   ??? losartan (COZAAR) 25 mg tablet TAKE 1 TAB BY MOUTH DAILY. 30 Tab 3   ??? pantoprazole (PROTONIX) 40 mg tablet TAKE 1 TABLET BY MOUTH DAILY 90 Tab 0   ??? nabumetone (RELAFEN) 500 mg tablet TAKE 1 TAB BY MOUTH TWO (2) TIMES A  DAY. 60 Tab 0   ??? aspirin delayed-release 81 mg tablet TAKE 1 TAB BY MOUTH DAILY. 90 Tab 3   ??? DULoxetine (CYMBALTA) 60 mg capsule TAKE 1 CAPSULE BY MOUTH DAILY 90 Cap 2   ??? clopidogrel (PLAVIX) 75 mg tab TAKE 1 TABLET BY MOUTH DAILY 30 Tab 5   ??? metFORMIN ER (GLUCOPHAGE XR) 500 mg tablet TAKE 2 TABLETS BY MOUTH IN THE MORNING WITH BREAKFAST AND 3 TABLETS BY MOUTH AT BEDTIME (Patient taking differently: Reports 2 tabs am & pm) 450 Tab 3   ??? ezetimibe (ZETIA) 10 mg tablet TAKE 1 TAB BY MOUTH NIGHTLY. 90 Tab 3   ??? LEVEMIR FLEXTOUCH U-100 INSULN 100 unit/mL (3 mL) inpn INJECT 34 UNITS UNDER THE SKIN NIGHTLY AT BEDTIME 45 Adjustable Dose Pre-filled Pen Syringe 1   ??? lidocaine (LIDODERM) 5 % Apply patch to the affected area for 12 hours a day and remove for 12 hours a day. (Patient taking differently: 1 Patch by TransDERmal route daily as needed. Apply patch to the affected area for 12 hours a day and remove  for 12 hours a day.) 10 Each 2   ??? magnesium oxide (MAG-OX) 400 mg tablet Take 1 Tab by mouth daily. 90 Tab 3   ??? VESICARE 10 mg tablet TAKE 1 TABLET BY MOUTH DAILY. 90 Tab 4   ??? gabapentin (NEURONTIN) 300 mg capsule Take 1,200 mg by mouth nightly.     ??? acetaminophen (ACETAMINOPHEN EXTRA STRENGTH) 500 mg tablet Take 1,000 mg by mouth every six (6) hours. With tramadol     ??? nitroglycerin (NITROSTAT) 0.4 mg SL tablet 1 Tab by SubLINGual route every five (5) minutes as needed for Chest Pain for up to 3 doses. 1 Bottle 1   ??? bumetanide (BUMEX) 2 mg tablet Take 2 mg by mouth daily.         Past Medical History:   Diagnosis Date   ??? Acute MI (HCC)     x3   ??? 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 Arthritis    ??? CAD (coronary artery disease)    ??? Cervical spondylolysis    ??? Chronic pain    ??? COPD    ??? Diabetes (HCC)    ??? GERD (gastroesophageal reflux disease)    ??? Goiter 04/20/2012   ??? Hypertension    ??? Neuropathy 04/06/2013       Allergies   Allergen Reactions   ??? Morphine Anaphylaxis     Tolerates oxycodone without problem   ??? Soap Rash   ??? Avalide [Irbesartan-Hydrochlorothiazide] Myalgia   ??? Betadine [Povidone-Iodine] Rash   ??? Demerol [Meperidine] Other (comments)     Makes her feel crazy   ??? Pcn [Penicillins] Swelling   ??? Prinivil [Lisinopril] Cough   ??? Statins-Hmg-Coa Reductase Inhibitors Myalgia   ??? Sulfa (Sulfonamide Antibiotics) Unknown (comments)   ??? Tricor [Fenofibrate Micronized] Myalgia       Social History     Socioeconomic History   ??? Marital status: MARRIED     Spouse name: Not on file   ??? Number of children: Not on file   ??? Years of education: Not on file   ??? Highest education level: Not on file   Occupational History   ??? Not on file   Social Needs   ??? Financial resource strain: Not on file   ??? Food insecurity:     Worry: Not  on file     Inability: Not on file   ??? Transportation needs:     Medical: Not on file     Non-medical: Not on file   Tobacco Use   ???  Smoking status: Former Smoker     Packs/day: 0.30     Years: 45.00     Pack years: 13.50     Types: Cigarettes   ??? Smokeless tobacco: Never Used   ??? Tobacco comment: quit around the first of November   Substance and Sexual Activity   ??? Alcohol use: Yes     Frequency: Monthly or less     Drinks per session: 1 or 2     Binge frequency: Never     Comment: may be 3 times a year if that   ??? Drug use: No   ??? Sexual activity: Not on file   Lifestyle   ??? Physical activity:     Days per week: Not on file     Minutes per session: Not on file   ??? Stress: Not on file   Relationships   ??? Social connections:     Talks on phone: Not on file     Gets together: Not on file     Attends religious service: Not on file     Active member of club or organization: Not on file     Attends meetings of clubs or organizations: Not on file     Relationship status: Not on file   ??? Intimate partner violence:     Fear of current or ex partner: Not on file     Emotionally abused: Not on file     Physically abused: Not on file     Forced sexual activity: Not on file   Other Topics Concern   ??? Not on file   Social History Narrative   ??? Not on file       ??  Review of Systems   09/30/2018  ??  Constitutional: Unexplained: Positive  Genitourinary: Frequent Urination: Negative  HEENT: Vision Loss: Negative  Neurological: Memory Loss: Negative  Integumentary: Rash: Negative  Cardiovascular: Palpatations: Negative  Hematologic: Bruises/Bleeds Easily: Negative   Immunological: Seasonal Allergies: Negative  Musculoskeletal: Joint Pain: Positive  ??  ??  OBJECTIVE:  Constitutional: ??No acute distress. Pleasant and cooperative with exam.  HEENT: Normocephalic. Atraumatic.??Eyes are clear  Hearing intact to spoken word   Respiratory: ??Breathing unlabored..  Cardiovascular: ??No marked edema.  Psychiatric: Alert. ??Affect appropriate.  Gait: Normal.  Musculoskeletal    She has a small wound on the dorsal aspect of the forearm just distal to the elbow.  This is open.   There is some granulation tissue at the base.  Some fibrinous material.  No active drainage today.  She has a significantly swollen olecranon bursa.  She has erythema the bursa.  No palpable effusion.  She has some palpable masses in the olecranon bursa.  ??  IMAGING / STUDIES:   Order: XR ELBOW 3+ VW LEFT - Indication: Septic olecranon bursitis of   left elbow        X-ray Elbow Left 3+ Views ([id number])  ??  Result Date: 09/30/2018  AP, Oblique, Lat.   ??  Impression: I ordered and interpreted three views of the elbow.  These demonstrate calcifications within the olecranon bursa.

## 2018-10-04 NOTE — Other (Signed)
Patient given surgical site infection information FAQs handout and hand hygiene tips sheet.Pre-operative instructions reviewed and patient verbalizes understanding of instructions. Patient has been given the opportunity to ask additional questions.PATIENT GIVEN 2 BOTTLES OF CHG SOAP TO USE DAY BEFORE SURGERY AND DOS. INSTRUCTIONS PROVIDED.PT ADVISED TO NOT EAT SOLID FOODS AFTER MIDNIGHT THE NIGHT BEFORE SURGERY INCLUDING CANDY,MINTS OR GUM. DO NOT DRINK ALCOHOL 24 HOURS BEFORE SURGERY. PT MAY DRINK CLEAR LIQUIDS FROM 12 MIDNIGHT UNTIL 1 HOUR PRIOR TO ARRIVAL.

## 2018-10-04 NOTE — H&P (Signed)
CARE TEAM:  Patient Care Team:  [name], MD as PCP - General (Internal Medicine)  ??  ASSESSMENT:  1. Septic olecranon bursitis of left elbow    2. Open wound of left elbow, initial encounter       ??      Patient Active Problem List   Diagnosis   ??? Atherosclerosis of aorta   ??? CAD (coronary artery disease)   ??? Coronary atherosclerosis of native coronary artery   ??? Hypertension complicating diabetes   ??? Mixed hyperlipidemia   ??? Type 2 diabetes mellitus with nephropathy   ??? Primary osteoarthritis of left hip   ??? Primary localized osteoarthrosis of the knee, right   ??? H/O total hip arthroplasty, left   ??  ??  PLAN:  Treatment Plan:  Believe she has septic olecranon bursitis.  We will put her back on p.o. antibiotics.  we will schedule her for a bursectomy.  She will need to be off of her anticoagulation preoperatively.  ??      Orders Placed This Encounter   ??? X-ray elbow left 3+ views (16109)   ??? BMI >=25 PATIENT INSTRUCTIONS & EDUCATION   ??? doxycycline (VIBRAMYCIN) 100 MG capsule      Follow-up: Return for first postoperative visit.   ??  HISTORY OF PRESENT ILLNESS:  Chief Complaint: Pain of the Left Elbow     Age: 67 y.o.  Sex: female   History of present illness: Kelsey [name] ??is a pleasant 67 y.o. female who presents today for evaluation of left elbow pain and swelling.  She was seen by dermatologist about 3 weeks ago with a small painful nodule on the dorsal aspect of the proximal forearm.  She had this biopsied by her dermatologist.  Several days later she noticed that the biopsy site had opened.  Her sutures had come out.  She had some drainage.  She had Steri-Strips applied.  She was then diagnosed with the infection several days later we.  She had a culture which revealed sensitive Staph.  She was put on doxycycline.  This provided some relief.  She continues to have drainage from the wound.  She now has developed worsening swelling and pain more proximally around the elbow posteriorly.  Denies history of  gout.  She does have a history of diabetes.  She has a history of multiple MIs with stents.  She is on Plavix.  She has been told in the past she can come off of her Plavix for surgical management.  ??  Past medical history, past surgical history, medications, allergies, social history, and review of systems have been reviewed by me.    No current facility-administered medications on file prior to encounter.      Current Outpatient Medications on File Prior to Encounter   Medication Sig Dispense Refill   ??? cholecalciferol (VITAMIN D3) 2,000 unit cap capsule TAKE 1 CAPSULE BY MOUTH EVERY DAY 90 Cap 3   ??? carvedilol (COREG) 12.5 mg tablet TAKE 1 TABLET BY MOUTH TWICE A DAY (Patient taking differently: 25 mg.) 180 Tab 0   ??? famotidine (PEPCID) 40 mg tablet TAKE 1 TABLET BY MOUTH TWICE A DAY 60 Tab 1   ??? losartan (COZAAR) 25 mg tablet TAKE 1 TAB BY MOUTH DAILY. 30 Tab 3   ??? pantoprazole (PROTONIX) 40 mg tablet TAKE 1 TABLET BY MOUTH DAILY 90 Tab 0   ??? nabumetone (RELAFEN) 500 mg tablet TAKE 1 TAB BY MOUTH TWO (2) TIMES A  DAY. 60 Tab 0   ??? aspirin delayed-release 81 mg tablet TAKE 1 TAB BY MOUTH DAILY. 90 Tab 3   ??? DULoxetine (CYMBALTA) 60 mg capsule TAKE 1 CAPSULE BY MOUTH DAILY 90 Cap 2   ??? clopidogrel (PLAVIX) 75 mg tab TAKE 1 TABLET BY MOUTH DAILY 30 Tab 5   ??? metFORMIN ER (GLUCOPHAGE XR) 500 mg tablet TAKE 2 TABLETS BY MOUTH IN THE MORNING WITH BREAKFAST AND 3 TABLETS BY MOUTH AT BEDTIME (Patient taking differently: Reports 2 tabs am & pm) 450 Tab 3   ??? ezetimibe (ZETIA) 10 mg tablet TAKE 1 TAB BY MOUTH NIGHTLY. 90 Tab 3   ??? LEVEMIR FLEXTOUCH U-100 INSULN 100 unit/mL (3 mL) inpn INJECT 34 UNITS UNDER THE SKIN NIGHTLY AT BEDTIME 45 Adjustable Dose Pre-filled Pen Syringe 1   ??? lidocaine (LIDODERM) 5 % Apply patch to the affected area for 12 hours a day and remove for 12 hours a day. (Patient taking differently: 1 Patch by TransDERmal route daily as needed. Apply patch to the affected area for 12  hours a day and remove for 12 hours a day.) 10 Each 2   ??? magnesium oxide (MAG-OX) 400 mg tablet Take 1 Tab by mouth daily. 90 Tab 3   ??? VESICARE 10 mg tablet TAKE 1 TABLET BY MOUTH DAILY. 90 Tab 4   ??? gabapentin (NEURONTIN) 300 mg capsule Take 1,200 mg by mouth nightly.     ??? acetaminophen (ACETAMINOPHEN EXTRA STRENGTH) 500 mg tablet Take 1,000 mg by mouth every six (6) hours. With tramadol     ??? nitroglycerin (NITROSTAT) 0.4 mg SL tablet 1 Tab by SubLINGual route every five (5) minutes as needed for Chest Pain for up to 3 doses. 1 Bottle 1   ??? bumetanide (BUMEX) 2 mg tablet Take 2 mg by mouth daily.         Past Medical History:   Diagnosis Date   ??? Acute MI (HCC)     x3   ??? 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 Arthritis    ??? CAD (coronary artery disease)    ??? Cervical spondylolysis    ??? Chronic pain    ??? COPD    ??? Diabetes (HCC)    ??? GERD (gastroesophageal reflux disease)    ??? Goiter 04/20/2012   ??? Hypertension    ??? Neuropathy 04/06/2013       Allergies   Allergen Reactions   ??? Morphine Anaphylaxis     Tolerates oxycodone without problem   ??? Soap Rash   ??? Avalide [Irbesartan-Hydrochlorothiazide] Myalgia   ??? Betadine [Povidone-Iodine] Rash   ??? Demerol [Meperidine] Other (comments)     Makes her feel crazy   ??? Pcn [Penicillins] Swelling   ??? Prinivil [Lisinopril] Cough   ??? Statins-Hmg-Coa Reductase Inhibitors Myalgia   ??? Sulfa (Sulfonamide Antibiotics) Unknown (comments)   ??? Tricor [Fenofibrate Micronized] Myalgia       Social History     Socioeconomic History   ??? Marital status: MARRIED     Spouse name: Not on file   ??? Number of children: Not on file   ??? Years of education: Not on file   ??? Highest education level: Not on file   Occupational History   ??? Not on file   Social Needs   ??? Financial resource strain: Not on file   ??? Food insecurity:     Worry: Not  on file     Inability: Not on file   ??? Transportation needs:     Medical: Not on file     Non-medical: Not on file    Tobacco Use   ??? Smoking status: Former Smoker     Packs/day: 0.30     Years: 45.00     Pack years: 13.50     Types: Cigarettes   ??? Smokeless tobacco: Never Used   ??? Tobacco comment: quit around the first of November   Substance and Sexual Activity   ??? Alcohol use: Yes     Frequency: Monthly or less     Drinks per session: 1 or 2     Binge frequency: Never     Comment: may be 3 times a year if that   ??? Drug use: No   ??? Sexual activity: Not on file   Lifestyle   ??? Physical activity:     Days per week: Not on file     Minutes per session: Not on file   ??? Stress: Not on file   Relationships   ??? Social connections:     Talks on phone: Not on file     Gets together: Not on file     Attends religious service: Not on file     Active member of club or organization: Not on file     Attends meetings of clubs or organizations: Not on file     Relationship status: Not on file   ??? Intimate partner violence:     Fear of current or ex partner: Not on file     Emotionally abused: Not on file     Physically abused: Not on file     Forced sexual activity: Not on file   Other Topics Concern   ??? Not on file   Social History Narrative   ??? Not on file       ??  Review of Systems   09/30/2018  ??  Constitutional: Unexplained: Positive  Genitourinary: Frequent Urination: Negative  HEENT: Vision Loss: Negative  Neurological: Memory Loss: Negative  Integumentary: Rash: Negative  Cardiovascular: Palpatations: Negative  Hematologic: Bruises/Bleeds Easily: Negative   Immunological: Seasonal Allergies: Negative  Musculoskeletal: Joint Pain: Positive  ??  ??  OBJECTIVE:  Constitutional: ??No acute distress. Pleasant and cooperative with exam.  HEENT: Normocephalic. Atraumatic.??Eyes are clear  Hearing intact to spoken word   Respiratory: ??Breathing unlabored..  Cardiovascular: ??No marked edema.  Psychiatric: Alert. ??Affect appropriate.  Gait: Normal.  Musculoskeletal    She has a small wound on the dorsal aspect of the forearm just distal to  the elbow.  This is open.  There is some granulation tissue at the base.  Some fibrinous material.  No active drainage today.  She has a significantly swollen olecranon bursa.  She has erythema the bursa.  No palpable effusion.  She has some palpable masses in the olecranon bursa.  ??  IMAGING / STUDIES:   Order: XR ELBOW 3+ VW LEFT - Indication: Septic olecranon bursitis of   left elbow        X-ray Elbow Left 3+ Views ([id number])  ??  Result Date: 09/30/2018  AP, Oblique, Lat.   ??  Impression: I ordered and interpreted three views of the elbow.  These demonstrate calcifications within the olecranon bursa.

## 2018-10-05 ENCOUNTER — Inpatient Hospital Stay: Payer: MEDICARE

## 2018-10-05 LAB — POCT GLUCOSE
POC Glucose: 88 mg/dL (ref 65–100)
POC Glucose: 93 mg/dL (ref 65–100)

## 2018-10-05 LAB — GLUCOSE, POC
Glucose (POC): 88 mg/dL (ref 65–100)
Glucose (POC): 93 mg/dL (ref 65–100)

## 2018-10-05 MED ORDER — SODIUM CHLORIDE 0.9 % IV
INTRAVENOUS | Status: DC
Start: 2018-10-05 — End: 2018-10-05

## 2018-10-05 MED ORDER — HYDROMORPHONE (PF) 1 MG/ML IJ SOLN
1 mg/mL | INTRAMUSCULAR | Status: DC | PRN
Start: 2018-10-05 — End: 2018-10-05

## 2018-10-05 MED ORDER — ROPIVACAINE (PF) 5 MG/ML (0.5 %) INJECTION
5 mg/mL (0. %) | INTRAMUSCULAR | Status: AC
Start: 2018-10-05 — End: ?

## 2018-10-05 MED ORDER — DIPHENHYDRAMINE HCL 50 MG/ML IJ SOLN
50 mg/mL | INTRAMUSCULAR | Status: DC | PRN
Start: 2018-10-05 — End: 2018-10-05

## 2018-10-05 MED ORDER — MIDAZOLAM 1 MG/ML IJ SOLN
1 mg/mL | INTRAMUSCULAR | Status: DC | PRN
Start: 2018-10-05 — End: 2018-10-05

## 2018-10-05 MED ORDER — FENTANYL CITRATE (PF) 50 MCG/ML IJ SOLN
50 mcg/mL | INTRAMUSCULAR | Status: DC | PRN
Start: 2018-10-05 — End: 2018-10-05

## 2018-10-05 MED ORDER — DOXYCYCLINE 100 MG TAB
100 mg | ORAL_TABLET | Freq: Two times a day (BID) | ORAL | 0 refills | Status: AC
Start: 2018-10-05 — End: ?

## 2018-10-05 MED ORDER — VANCOMYCIN 1,000 MG IV SOLR
1000 mg | INTRAVENOUS | Status: AC
Start: 2018-10-05 — End: ?

## 2018-10-05 MED ORDER — ACETAMINOPHEN 325 MG TABLET
325 mg | Freq: Once | ORAL | Status: DC
Start: 2018-10-05 — End: 2018-10-05

## 2018-10-05 MED ORDER — GLYCOPYRROLATE 0.2 MG/ML IJ SOLN
0.2 mg/mL | Freq: Once | INTRAMUSCULAR | Status: DC | PRN
Start: 2018-10-05 — End: 2018-10-05

## 2018-10-05 MED ORDER — ADV ADDAPTOR
Status: AC
Start: 2018-10-05 — End: ?

## 2018-10-05 MED ORDER — VANCOMYCIN IN 0.9% SODIUM CHLORIDE 1.5 G/500 ML IV
1.5 g/500 mL | Freq: Once | INTRAVENOUS | Status: DC
Start: 2018-10-05 — End: 2018-10-05

## 2018-10-05 MED ORDER — MEPIVACAINE (PF) 15 MG/ML (1.5 %) INJECTION
15 mg/mL (1.5 %) | INTRAMUSCULAR | Status: AC
Start: 2018-10-05 — End: 2018-10-05
  Administered 2018-10-05: 14:00:00 via PERINEURAL

## 2018-10-05 MED ORDER — SODIUM CHLORIDE 0.9 % IV PIGGY BACK
INTRAVENOUS | Status: AC
Start: 2018-10-05 — End: ?

## 2018-10-05 MED ORDER — ALBUTEROL SULFATE 0.083 % (0.83 MG/ML) SOLN FOR INHALATION
2.5 mg /3 mL (0.083 %) | RESPIRATORY_TRACT | Status: DC | PRN
Start: 2018-10-05 — End: 2018-10-05

## 2018-10-05 MED ORDER — ACETAMINOPHEN 325 MG TABLET
325 mg | ORAL | Status: AC
Start: 2018-10-05 — End: ?

## 2018-10-05 MED ORDER — LACTATED RINGERS IV
INTRAVENOUS | Status: DC | PRN
Start: 2018-10-05 — End: 2018-10-05
  Administered 2018-10-05: 14:00:00 via INTRAVENOUS

## 2018-10-05 MED ORDER — MIDAZOLAM 1 MG/ML IJ SOLN
1 mg/mL | INTRAMUSCULAR | Status: AC
Start: 2018-10-05 — End: ?

## 2018-10-05 MED ORDER — LIDOCAINE (PF) 10 MG/ML (1 %) IJ SOLN
10 mg/mL (1 %) | INTRAMUSCULAR | Status: DC | PRN
Start: 2018-10-05 — End: 2018-10-05

## 2018-10-05 MED ORDER — ROPIVACAINE (PF) 5 MG/ML (0.5 %) INJECTION
5 mg/mL (0. %) | INTRAMUSCULAR | Status: AC
Start: 2018-10-05 — End: 2018-10-05
  Administered 2018-10-05: 14:00:00 via PERINEURAL

## 2018-10-05 MED ORDER — MEPIVACAINE (PF) 10 MG/ML (1 %) INJECTION
10 mg/mL (1 %) | INTRAMUSCULAR | Status: AC
Start: 2018-10-05 — End: ?

## 2018-10-05 MED ORDER — VANCOMYCIN 1,000 MG IV SOLR
1000 mg | INTRAVENOUS | Status: DC | PRN
Start: 2018-10-05 — End: 2018-10-05
  Administered 2018-10-05: 14:00:00 via INTRAVENOUS

## 2018-10-05 MED ORDER — ONDANSETRON (PF) 4 MG/2 ML INJECTION
4 mg/2 mL | INTRAMUSCULAR | Status: DC | PRN
Start: 2018-10-05 — End: 2018-10-05

## 2018-10-05 MED ORDER — MEPIVACAINE (PF) 20 MG/ML (2 %) INJECTION
20 mg/mL (2 %) | INTRAMUSCULAR | Status: AC
Start: 2018-10-05 — End: ?

## 2018-10-05 MED ORDER — PROPOFOL 10 MG/ML IV EMUL
10 mg/mL | INTRAVENOUS | Status: DC | PRN
Start: 2018-10-05 — End: 2018-10-05
  Administered 2018-10-05 (×2): via INTRAVENOUS

## 2018-10-05 MED ORDER — HYDROCODONE-ACETAMINOPHEN 5 MG-325 MG TAB
5-325 mg | ORAL | Status: DC | PRN
Start: 2018-10-05 — End: 2018-10-05

## 2018-10-05 MED ORDER — MIDAZOLAM 1 MG/ML IJ SOLN
1 mg/mL | INTRAMUSCULAR | Status: DC | PRN
Start: 2018-10-05 — End: 2018-10-05
  Administered 2018-10-05: 14:00:00 via INTRAVENOUS

## 2018-10-05 MED ORDER — LACTATED RINGERS IV
INTRAVENOUS | Status: DC
Start: 2018-10-05 — End: 2018-10-05
  Administered 2018-10-05: 14:00:00 via INTRAVENOUS

## 2018-10-05 MED ORDER — LACTATED RINGERS IV
INTRAVENOUS | Status: DC
Start: 2018-10-05 — End: 2018-10-05

## 2018-10-05 MED ORDER — OXYCODONE-ACETAMINOPHEN 5 MG-325 MG TAB
5-325 mg | ORAL_TABLET | ORAL | 0 refills | Status: AC | PRN
Start: 2018-10-05 — End: 2018-10-08

## 2018-10-05 MED ORDER — ROPIVACAINE (PF) 5 MG/ML (0.5 %) INJECTION
5 mg/mL (0. %) | INTRAMUSCULAR | Status: DC | PRN
Start: 2018-10-05 — End: 2018-10-05

## 2018-10-05 MED FILL — MIDAZOLAM 1 MG/ML IJ SOLN: 1 mg/mL | INTRAMUSCULAR | Qty: 2

## 2018-10-05 MED FILL — POLOCAINE-MPF 20 MG/ML (2 %) INJECTION SOLUTION: 20 mg/mL (2 %) | INTRAMUSCULAR | Qty: 20

## 2018-10-05 MED FILL — LACTATED RINGERS IV: INTRAVENOUS | Qty: 1000

## 2018-10-05 MED FILL — ADV ADDAPTOR: Qty: 1

## 2018-10-05 MED FILL — ROPIVACAINE (PF) 5 MG/ML (0.5 %) INJECTION: 5 mg/mL (0. %) | INTRAMUSCULAR | Qty: 30

## 2018-10-05 MED FILL — ACETAMINOPHEN 325 MG TABLET: 325 mg | ORAL | Qty: 2

## 2018-10-05 MED FILL — SODIUM CHLORIDE 0.9 % IV: INTRAVENOUS | Qty: 1000

## 2018-10-05 MED FILL — VANCOMYCIN 1,000 MG IV SOLR: 1000 mg | INTRAVENOUS | Qty: 1000

## 2018-10-05 MED FILL — CARBOCAINE (PF) 10 MG/ML (1 %) INJECTION SOLUTION: 10 mg/mL (1 %) | INTRAMUSCULAR | Qty: 30

## 2018-10-05 MED FILL — SODIUM CHLORIDE 0.9 % IV PIGGY BACK: INTRAVENOUS | Qty: 250

## 2018-10-05 NOTE — Anesthesia Pre-Procedure Evaluation (Signed)
Anesthetic History   No history of anesthetic complications            Review of Systems / Medical History  Patient summary reviewed, nursing notes reviewed and pertinent labs reviewed    Pulmonary    COPD      Smoker  Asthma        Neuro/Psych         Psychiatric history     Cardiovascular    Hypertension  Valvular problems/murmurs      Dysrhythmias   CABG/stent    Exercise tolerance: >4 METS     GI/Hepatic/Renal     GERD: well controlled           Endo/Other    Diabetes    Obesity and arthritis     Other Findings              Physical Exam    Airway  Mallampati: II  TM Distance: > 6 cm  Neck ROM: normal range of motion   Mouth opening: Normal     Cardiovascular  Regular rate and rhythm,  S1 and S2 normal,  no murmur, click, rub, or gallop             Dental    Dentition: Full upper dentures     Pulmonary                 Abdominal  GI exam deferred       Other Findings            Anesthetic Plan    ASA: 3  Anesthesia type: regional - supraclavicular block          Induction: Intravenous  Anesthetic plan and risks discussed with: Patient

## 2018-10-05 NOTE — Op Note (Signed)
BRIEF OPERATIVE NOTE    Date of Procedure: 10/05/2018   Preoperative Diagnosis: LEFT SEPTIC OLECRANON BURSITIS  Postoperative Diagnosis: LEFT SEPTIC OLECRANON BURSITIS    Procedure(s):  LEFT OLECRANON BURSECTOMY  Surgeon(s) and Role:     * Muriah Harsha, Halina Andreas, MD - Primary         Surgical Assistant: none    Surgical Staff:  Circ-1: Cherene Altes, RN  Registered Nurse Assistant: Malachy Chamber, RN  Scrub RN-1: Sharlyne Pacas, RN  Event Time In Time Out   Incision Start 1012    Incision Close 1034      Anesthesia: Regional   Estimated Blood Loss: min  Specimens:   ID Type Source Tests Collected by Time Destination   1 : LEFT ELBOW OLECRANON BURSA Fresh Elbow  Russ Halo, MD 10/05/2018 1023 Pathology   1 : LEFT ELBOW BURSA Wound Elbow CULTURE, ANAEROBIC Russ Halo, MD 10/05/2018 1018 Microbiology      Findings: large inflamed olecranon bursa   Complications: none  Implants: * No implants in log *

## 2018-10-05 NOTE — Anesthesia Post-Procedure Evaluation (Signed)
Post-Anesthesia Evaluation and Assessment    Patient: Kelsey [name] MRN: [id number]  SSN: [id number]    Date of Birth: Jan 21, 1951  Age: 67 y.o.  Sex: female      I have evaluated the patient and they are stable and ready for discharge from the PACU.     Cardiovascular Function/Vital Signs  Visit Vitals  BP 129/64   Pulse 63   Temp 36.4 ??C (97.5 ??F)   Resp 11   SpO2 98%       Patient is status post Regional anesthesia for Procedure(s):  LEFT OLECRANON BURSECTOMY.    Nausea/Vomiting: None    Postoperative hydration reviewed and adequate.    Pain:  Pain Scale 1: Numeric (0 - 10) (10/05/18 1046)  Pain Intensity 1: 0 (10/05/18 1046)   Managed    Neurological Status:   Neuro (WDL): Exceptions to WDL (10/05/18 1046)  Neuro  Neurologic State: Eyes open to voice (10/05/18 1046)  LUE Motor Response: Other(comment)(s/p nerve block) (10/05/18 1046)   At baseline    Mental Status, Level of Consciousness: Alert and  oriented to person, place, and time    Pulmonary Status:   O2 Device: Nasal cannula (10/05/18 1046)   Adequate oxygenation and airway patent    Complications related to anesthesia: None    Post-anesthesia assessment completed. No concerns    Signed By: Bjorn PippinJoshua R Javonda Suh, MD     October 05, 2018              Procedure(s):  LEFT OLECRANON BURSECTOMY.    regional    <BSHSIANPOST>    Vitals Value Taken Time   BP 123/81 10/05/2018 11:18 AM   Temp 36.4 ??C (97.5 ??F) 10/05/2018 10:46 AM   Pulse 66 10/05/2018 11:19 AM   Resp 12 10/05/2018 11:19 AM   SpO2 98 % 10/05/2018 11:19 AM   Vitals shown include unvalidated device data.

## 2018-10-05 NOTE — Progress Notes (Signed)
1210: Dr. Janann AugustWeiss notified of elevated BP. No new orders received at this time. Will continue to monitor. Report given to Linford ArnoldBarbara Clarke, RN.

## 2018-10-05 NOTE — Op Note (Signed)
[facility]  OPERATIVE REPORT    Name:  Kelsey [name], Kelsey [name]  MR#:  [id number]  DOB:  05/09/1951  ACCOUNT #:  [id number]  DATE OF SERVICE:  10/05/2018      PREOPERATIVE DIAGNOSIS:  Septic left olecranon bursitis.    POSTOPERATIVE DIAGNOSIS:  Septic left olecranon bursitis.    PROCEDURE PERFORMED:  Left olecranon bursectomy.    SURGEON:  Russ Halolifford T Trentan Trippe, MD    ASSISTANT:  None    ANESTHESIA:  Regional.    COMPLICATIONS:  None.    SPECIMENS REMOVED:  Cultures and pathology of left olecranon bursa.    IMPLANTS:  None.    ESTIMATED BLOOD LOSS:  None.    INDICATIONS FOR THE PROCEDURE:  This is a 67 year old female who has an open wound overlying the left olecranon bursa with evidence of significant inflammation of the bursa.  She is indicated for a bursectomy.  We have discussed risks, benefits, potential complications and alternatives of surgery and she has given informed consent to proceed.    DESCRIPTION OF THE PROCEDURE:  The patient was identified in the preoperative holding area.  Informed consent was obtained.  The operative site was marked.  Regional anesthesia was then established.  She was then transported to the OR and placed supine on the operating table.  All bony prominences were well-padded.  The tourniquet was applied to the upper brachium.  After induction of deep sedation, the left upper extremity was sterilely prepped and draped in the usual fashion.  Surgical time-out was held.  The operative site was confirmed.  Preoperative antibiotics were used.  After gravity exsanguination, the tourniquet was elevated to 250 mmHg.  She was found to have a massively inflamed olecranon bursa with a small wound over the distal aspect of the bursa.  There was mild erythema.  There was minimal drainage from the wound.  A long longitudinal incision was made on the posterior aspect of the elbow incorporating the small wound.  The wound was then ellipsed out.  Large skin flaps were raised.  A  significantly inflamed and fibrinous olecranon bursa was identified and was excised completely with a 15-blade.  She was found to have several calcifications within the bursa.  There was minimal fluid within the bursa which was cultured.  The bursa was sent to pathology.  The wound was then debrided with a rongeur and a curette.  Bleeding was easily controlled.  The wound was thoroughly irrigated with a liter of normal saline with bacitracin.  The skin edges were then reapproximated with 3-0 nylon sutures.  Sterile dressings were applied.  A long-arm splint was applied.  The tourniquet was let down and brisk cap refill returned to the digits.  She was transferred to the PACU in stable condition without complication.        Russ HaloLIFFORD T Deroy Noah, MD      [id number]  D:  10/05/2018 10:40  T:  10/05/2018 11:33  JOB #:  [id number]

## 2018-10-05 NOTE — Anesthesia Procedure Notes (Signed)
Peripheral Block    Start time: 10/05/2018 9:41 AM  End time: 10/05/2018 9:47 AM  Performed by: Bjorn Pippin, MD  Authorized by: Bjorn Pippin, MD       Pre-procedure:   Indications: primary anesthetic    Preanesthetic Checklist: patient identified, risks and benefits discussed, site marked, timeout performed, anesthesia consent given and patient being monitored    Timeout Time: 09:41          Block Type:   Block Type:  Supraclavicular  Laterality:  Left  Monitoring:  Standard ASA monitoring, continuous pulse ox, frequent vital sign checks, heart rate, oxygen and responsive to questions  Injection Technique:  Single shot  Procedures: nerve stimulator    Patient Position: supine  Prep: chlorhexidine    Location:  Supraclavicular  Needle Type:  Stimuplex  Needle Gauge:  22 G  Motor Response: minimal motor response >0.4 mA      Assessment:  Number of attempts:  1  Injection Assessment:  Incremental injection every 5 mL, negative aspiration for CSF, no paresthesia, no intravascular symptoms and negative aspiration for blood  Patient tolerance:  Patient tolerated the procedure well with no immediate complications

## 2018-10-05 NOTE — Brief Op Note (Signed)
BRIEF OPERATIVE NOTE    Date of Procedure: 10/05/2018   Preoperative Diagnosis: LEFT SEPTIC OLECRANON BURSITIS  Postoperative Diagnosis: LEFT SEPTIC OLECRANON BURSITIS    Procedure(s):  LEFT OLECRANON BURSECTOMY  Surgeon(s) and Role:     * Sheccid Lahmann T, MD - Primary         Surgical Assistant: none    Surgical Staff:  Circ-1: Briones, Louisa C, RN  Registered Nurse Assistant: Clemens, Meg M, RN  Scrub RN-1: Carroll, Denise E, RN  Event Time In Time Out   Incision Start 1012    Incision Close 1034      Anesthesia: Regional   Estimated Blood Loss: min  Specimens:   ID Type Source Tests Collected by Time Destination   1 : LEFT ELBOW OLECRANON BURSA Fresh Elbow  Jari Dipasquale T, MD 10/05/2018 1023 Pathology   1 : LEFT ELBOW BURSA Wound Elbow CULTURE, ANAEROBIC Lesleigh Hughson T, MD 10/05/2018 1018 Microbiology      Findings: large inflamed olecranon bursa   Complications: none  Implants: * No implants in log *

## 2018-10-05 NOTE — Anesthesia Post-Procedure Evaluation (Signed)
Post-Anesthesia Evaluation and Assessment    Patient: Kelsey [name] MRN: [id number]  SSN: [id number]    Date of Birth: 12/15/1951  Age: 66 y.o.  Sex: female      I have evaluated the patient and they are stable and ready for discharge from the PACU.     Cardiovascular Function/Vital Signs  Visit Vitals  BP 129/64   Pulse 63   Temp 36.4 ??C (97.5 ??F)   Resp 11   SpO2 98%       Patient is status post Regional anesthesia for Procedure(s):  LEFT OLECRANON BURSECTOMY.    Nausea/Vomiting: None    Postoperative hydration reviewed and adequate.    Pain:  Pain Scale 1: Numeric (0 - 10) (10/05/18 1046)  Pain Intensity 1: 0 (10/05/18 1046)   Managed    Neurological Status:   Neuro (WDL): Exceptions to WDL (10/05/18 1046)  Neuro  Neurologic State: Eyes open to voice (10/05/18 1046)  LUE Motor Response: Other(comment)(s/p nerve block) (10/05/18 1046)   At baseline    Mental Status, Level of Consciousness: Alert and  oriented to person, place, and time    Pulmonary Status:   O2 Device: Nasal cannula (10/05/18 1046)   Adequate oxygenation and airway patent    Complications related to anesthesia: None    Post-anesthesia assessment completed. No concerns    Signed By: Atina Feeley R Kenlei Safi, MD     October 05, 2018              Procedure(s):  LEFT OLECRANON BURSECTOMY.    regional    <BSHSIANPOST>    Vitals Value Taken Time   BP 123/81 10/05/2018 11:18 AM   Temp 36.4 ??C (97.5 ??F) 10/05/2018 10:46 AM   Pulse 66 10/05/2018 11:19 AM   Resp 12 10/05/2018 11:19 AM   SpO2 98 % 10/05/2018 11:19 AM   Vitals shown include unvalidated device data.

## 2018-10-05 NOTE — Anesthesia Pre-Procedure Evaluation (Addendum)
Anesthetic History   No history of anesthetic complications            Review of Systems / Medical History  Patient summary reviewed, nursing notes reviewed and pertinent labs reviewed    Pulmonary    COPD      Smoker  Asthma        Neuro/Psych         Psychiatric history     Cardiovascular    Hypertension  Valvular problems/murmurs      Dysrhythmias   CABG/stent    Exercise tolerance: >4 METS     GI/Hepatic/Renal     GERD: well controlled           Endo/Other    Diabetes    Obesity and arthritis     Other Findings              Physical Exam    Airway  Mallampati: II  TM Distance: > 6 cm  Neck ROM: normal range of motion   Mouth opening: Normal     Cardiovascular  Regular rate and rhythm,  S1 and S2 normal,  no murmur, click, rub, or gallop             Dental    Dentition: Full upper dentures     Pulmonary                 Abdominal  GI exam deferred       Other Findings            Anesthetic Plan    ASA: 3  Anesthesia type: regional - supraclavicular block          Induction: Intravenous  Anesthetic plan and risks discussed with: Patient

## 2018-10-05 NOTE — Progress Notes (Signed)
1210: Dr. Weiss notified of elevated BP. No new orders received at this time. Will continue to monitor. Report given to Barbara Clarke, RN.

## 2018-10-05 NOTE — Anesthesia Procedure Notes (Signed)
Peripheral Block    Start time: 10/05/2018 9:41 AM  End time: 10/05/2018 9:47 AM  Performed by: Yarelin Reichardt R, MD  Authorized by: Inetta Dicke R, MD       Pre-procedure:   Indications: primary anesthetic    Preanesthetic Checklist: patient identified, risks and benefits discussed, site marked, timeout performed, anesthesia consent given and patient being monitored    Timeout Time: 09:41          Block Type:   Block Type:  Supraclavicular  Laterality:  Left  Monitoring:  Standard ASA monitoring, continuous pulse ox, frequent vital sign checks, heart rate, oxygen and responsive to questions  Injection Technique:  Single shot  Procedures: nerve stimulator    Patient Position: supine  Prep: chlorhexidine    Location:  Supraclavicular  Needle Type:  Stimuplex  Needle Gauge:  22 G  Motor Response: minimal motor response >0.4 mA      Assessment:  Number of attempts:  1  Injection Assessment:  Incremental injection every 5 mL, negative aspiration for CSF, no paresthesia, no intravascular symptoms and negative aspiration for blood  Patient tolerance:  Patient tolerated the procedure well with no immediate complications

## 2018-10-05 NOTE — Op Note (Signed)
[facility]  OPERATIVE REPORT    Name:  Levengood, Anselma  MR#:  [id number]  DOB:  02/21/1951  ACCOUNT #:  [id number]  DATE OF SERVICE:  10/05/2018      PREOPERATIVE DIAGNOSIS:  Septic left olecranon bursitis.    POSTOPERATIVE DIAGNOSIS:  Septic left olecranon bursitis.    PROCEDURE PERFORMED:  Left olecranon bursectomy.    SURGEON:  Keashia Haskins T Riannon Mukherjee, MD    ASSISTANT:  None    ANESTHESIA:  Regional.    COMPLICATIONS:  None.    SPECIMENS REMOVED:  Cultures and pathology of left olecranon bursa.    IMPLANTS:  None.    ESTIMATED BLOOD LOSS:  None.    INDICATIONS FOR THE PROCEDURE:  This is a 66-year-old female who has an open wound overlying the left olecranon bursa with evidence of significant inflammation of the bursa.  She is indicated for a bursectomy.  We have discussed risks, benefits, potential complications and alternatives of surgery and she has given informed consent to proceed.    DESCRIPTION OF THE PROCEDURE:  The patient was identified in the preoperative holding area.  Informed consent was obtained.  The operative site was marked.  Regional anesthesia was then established.  She was then transported to the OR and placed supine on the operating table.  All bony prominences were well-padded.  The tourniquet was applied to the upper brachium.  After induction of deep sedation, the left upper extremity was sterilely prepped and draped in the usual fashion.  Surgical time-out was held.  The operative site was confirmed.  Preoperative antibiotics were used.  After gravity exsanguination, the tourniquet was elevated to 250 mmHg.  She was found to have a massively inflamed olecranon bursa with a small wound over the distal aspect of the bursa.  There was mild erythema.  There was minimal drainage from the wound.  A long longitudinal incision was made on the posterior aspect of the elbow incorporating the small wound.  The wound was then ellipsed out.  Large skin flaps were raised.  A  significantly inflamed and fibrinous olecranon bursa was identified and was excised completely with a 15-blade.  She was found to have several calcifications within the bursa.  There was minimal fluid within the bursa which was cultured.  The bursa was sent to pathology.  The wound was then debrided with a rongeur and a curette.  Bleeding was easily controlled.  The wound was thoroughly irrigated with a liter of normal saline with bacitracin.  The skin edges were then reapproximated with 3-0 nylon sutures.  Sterile dressings were applied.  A long-arm splint was applied.  The tourniquet was let down and brisk cap refill returned to the digits.  She was transferred to the PACU in stable condition without complication.        Kole Hilyard T Prisha Hiley, MD      [id number]  D:  10/05/2018 10:40  T:  10/05/2018 11:33  JOB #:  [id number]

## 2018-10-05 NOTE — Other (Signed)
Patient: Kelsey [name] MRN: [id number]  SSN: [id number]   Date of Birth: [birth year]  Age: 67 y.o.  Sex: female     Patient is status post Procedure(s):  LEFT OLECRANON BURSECTOMY.    Surgeon(s) and Role:     * Hepper, Halina Andreas, MD - Primary    Local/Dose/Irrigation:                   Peripheral IV 10/05/18 Right Hand (Active)                           Dressing/Packing:  Wound Elbow Left-Dressing Type: (XEROFORM, 4X4, ABD, CAST PADDING) (10/05/18 0900)    Splint/Cast: Wound Elbow Left-Splint Type/Material: (PLASTER SPLINT AND ACE BANDAGE)]    Other:

## 2018-10-05 NOTE — Interval H&P Note (Signed)
H&P Update:  Kelsey [name] was seen and examined.  History and physical has been reviewed. The patient has been examined. There have been no significant clinical changes since the completion of the originally dated History and Physical.

## 2018-10-07 ENCOUNTER — Encounter: Attending: Nurse Practitioner | Primary: Internal Medicine

## 2018-10-09 LAB — CULTURE, WOUND W GRAM STAIN
Culture result:: NO GROWTH
Culture: NO GROWTH
GRAM STAIN: NONE SEEN
Gram Stain Result: NONE SEEN

## 2018-10-09 LAB — CULTURE, ANAEROBIC
Culture result:: NO GROWTH
Culture: NO GROWTH

## 2018-10-10 MED ORDER — PRAVASTATIN 40 MG TAB
40 mg | ORAL_TABLET | ORAL | 3 refills | Status: AC
Start: 2018-10-10 — End: ?

## 2018-10-25 NOTE — Telephone Encounter (Signed)
Patient needs to come in to get PA for neurontin

## 2018-10-26 ENCOUNTER — Encounter: Attending: Internal Medicine | Primary: Internal Medicine

## 2018-10-27 ENCOUNTER — Encounter: Attending: Physician Assistant | Primary: Internal Medicine

## 2018-10-27 ENCOUNTER — Ambulatory Visit: Admit: 2018-10-27 | Attending: Internal Medicine | Primary: Internal Medicine

## 2018-10-27 ENCOUNTER — Ambulatory Visit: Attending: Internal Medicine | Primary: Internal Medicine

## 2018-10-27 DIAGNOSIS — G629 Polyneuropathy, unspecified: Secondary | ICD-10-CM

## 2018-10-27 MED ORDER — GABAPENTIN 300 MG CAP
300 mg | ORAL_CAPSULE | Freq: Three times a day (TID) | ORAL | 1 refills | Status: DC
Start: 2018-10-27 — End: 2019-03-31

## 2018-10-27 MED ORDER — PANTOPRAZOLE 40 MG TAB, DELAYED RELEASE
40 mg | ORAL_TABLET | ORAL | 0 refills | Status: DC
Start: 2018-10-27 — End: 2019-03-31

## 2018-10-27 MED ORDER — NABUMETONE 500 MG TAB
500 mg | ORAL_TABLET | ORAL | 0 refills | Status: DC
Start: 2018-10-27 — End: 2018-11-23

## 2018-10-27 NOTE — Progress Notes (Signed)
Flu shot given in right deltoid

## 2018-10-27 NOTE — Progress Notes (Signed)
Kelsey [name] is a 67 y.o. female and presents with   Chief Complaint   Patient presents with   ??? Other   .  Patient has had elbow surgery= still not healing well.  Needs refill and prior auth on neurontin. Also would like refill on relafen and pantoprazole      Current Outpatient Medications   Medication Sig Dispense Refill   ??? nabumetone (RELAFEN) 500 mg tablet TAKE 1 TAB BY MOUTH TWO (2) TIMES A DAY. 60 Tab 0   ??? gabapentin (NEURONTIN) 300 mg capsule Take 3 Caps by mouth three (3) times daily. Max Daily Amount: 2,700 mg. 270 Cap 1   ??? pantoprazole (PROTONIX) 40 mg tablet TAKE 1 TABLET BY MOUTH DAILY 90 Tab 0   ??? pravastatin (PRAVACHOL) 40 mg tablet TAKE 1 TAB BY MOUTH NIGHTLY. 90 Tab 3   ??? doxycycline (ADOXA) 100 mg tablet Take 1 Tab by mouth two (2) times a day. 20 Tab 0   ??? lubiPROStone (AMITIZA) 24 mcg capsule Take 24 mcg by mouth two (2) times daily (with meals).     ??? cholecalciferol (VITAMIN D3) 2,000 unit cap capsule TAKE 1 CAPSULE BY MOUTH EVERY DAY 90 Cap 3   ??? carvedilol (COREG) 12.5 mg tablet TAKE 1 TABLET BY MOUTH TWICE A DAY (Patient taking differently: 25 mg.) 180 Tab 0   ??? famotidine (PEPCID) 40 mg tablet TAKE 1 TABLET BY MOUTH TWICE A DAY 60 Tab 1   ??? losartan (COZAAR) 25 mg tablet TAKE 1 TAB BY MOUTH DAILY. 30 Tab 3   ??? aspirin delayed-release 81 mg tablet TAKE 1 TAB BY MOUTH DAILY. 90 Tab 3   ??? DULoxetine (CYMBALTA) 60 mg capsule TAKE 1 CAPSULE BY MOUTH DAILY 90 Cap 2   ??? clopidogrel (PLAVIX) 75 mg tab TAKE 1 TABLET BY MOUTH DAILY 30 Tab 5   ??? metFORMIN ER (GLUCOPHAGE XR) 500 mg tablet TAKE 2 TABLETS BY MOUTH IN THE MORNING WITH BREAKFAST AND 3 TABLETS BY MOUTH AT BEDTIME (Patient taking differently: Reports 2 tabs am & pm) 450 Tab 3   ??? ezetimibe (ZETIA) 10 mg tablet TAKE 1 TAB BY MOUTH NIGHTLY. 90 Tab 3   ??? LEVEMIR FLEXTOUCH U-100 INSULN 100 unit/mL (3 mL) inpn INJECT 34 UNITS UNDER THE SKIN NIGHTLY AT BEDTIME 45 Adjustable Dose Pre-filled Pen Syringe 1   ??? lidocaine (LIDODERM) 5 % Apply  patch to the affected area for 12 hours a day and remove for 12 hours a day. (Patient taking differently: 1 Patch by TransDERmal route daily as needed. Apply patch to the affected area for 12 hours a day and remove for 12 hours a day.) 10 Each 2   ??? magnesium oxide (MAG-OX) 400 mg tablet Take 1 Tab by mouth daily. 90 Tab 3   ??? VESICARE 10 mg tablet TAKE 1 TABLET BY MOUTH DAILY. 90 Tab 4   ??? nitroglycerin (NITROSTAT) 0.4 mg SL tablet 1 Tab by SubLINGual route every five (5) minutes as needed for Chest Pain for up to 3 doses. 1 Bottle 1   ??? bumetanide (BUMEX) 2 mg tablet Take 2 mg by mouth daily.       Allergies   Allergen Reactions   ??? Morphine Anaphylaxis     Tolerates oxycodone without problem   ??? Soap Rash   ??? Avalide [Irbesartan-Hydrochlorothiazide] Myalgia   ??? Betadine [Povidone-Iodine] Rash   ??? Demerol [Meperidine] Other (comments)     Makes her feel crazy   ???  Pcn [Penicillins] Swelling   ??? Prinivil [Lisinopril] Cough   ??? Statins-Hmg-Coa Reductase Inhibitors Myalgia   ??? Sulfa (Sulfonamide Antibiotics) Unknown (comments)   ??? Tricor [Fenofibrate Micronized] Myalgia     Past Medical History:   Diagnosis Date   ??? Acute MI (HCC)     x3   ??? 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 Arthritis    ??? CAD (coronary artery disease)    ??? Cervical spondylolysis    ??? Chronic pain    ??? COPD    ??? Diabetes (HCC)    ??? GERD (gastroesophageal reflux disease)    ??? Goiter 04/20/2012   ??? Hypertension    ??? Neuropathy 04/06/2013     Past Surgical History:   Procedure Laterality Date   ??? 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 CARDIAC SURG PROCEDURE UNLIST      PTCA   ??? CARDIAC SURG PROCEDURE UNLIST  1999    3 STENTS PLACED   ??? CARDIAC SURG PROCEDURE UNLIST      2 ILIAC STENTS   ??? COLONOSCOPY N/A 07/22/2018    COLONOSCOPY performed by Erling Conte., [facility] at [facility] ENDOSCOPY   ??? HX KNEE ARTHROSCOPY Right     X2   ??? HX ORTHOPAEDIC  2018    LTH   ??? HX ORTHOPAEDIC Right     ROTATOR CUFF REPAIR   ??? HX  OTHER SURGICAL Right 1970    GANGLION CYST WRIST   ??? HX TONSILLECTOMY  1963     Family History   Problem Relation Age of Onset   ??? Lung Disease Mother 33   ??? Other Mother         ANEURYSM   ??? Stroke Father    ??? Heart Disease Father    ??? Lung Disease Brother 61        COPD   ??? Hypertension Brother    ??? Cancer Brother         PROSTATE   ??? Heart Disease Brother    ??? Diabetes Brother    ??? Other Brother         SEPSIS   ??? No Known Problems Paternal Grandmother    ??? Anesth Problems Neg Hx      Social History     Tobacco Use   ??? Smoking status: Former Smoker     Packs/day: 0.30     Years: 45.00     Pack years: 13.50     Types: Cigarettes   ??? Smokeless tobacco: Never Used   ??? Tobacco comment: quit around the first of November   Substance Use Topics   ??? Alcohol use: Yes     Frequency: Monthly or less     Drinks per session: 1 or 2     Binge frequency: Never     Comment: may be 3 times a year if that      The patient has a history of falls. A plan of care for falls was documented..  Depression screen positive, medication was prescribed, drug therapy education given.      Objective:  Visit Vitals  BP 130/60   Ht 5\' 4"  (1.626 m)   Wt 181 lb (82.1 kg)   BMI 31.07 kg/m??     wdwn 67 yo wf  In NAD. A&O.  HEENT -- Pupils round.  O/P Clear.  Neck -- Supple. No  JVD.  Heart -- RRR. No R/M/G.  Lungs -- Coarse breath sounds  Abdomen -- Soft. Non-tender. Non-distended. No masses. Bowel sounds present.  Extremities -- No edema.  Left arm in cast.        Assessment/Plan:    ICD-10-CM ICD-9-CM    1. Neuropathy G62.9 355.9 nabumetone (RELAFEN) 500 mg tablet      gabapentin (NEURONTIN) 300 mg capsule   2. Chronic low back pain M54.5 724.2     G89.29 338.29    3. Gastroesophageal reflux disease without esophagitis K21.9 530.81 pantoprazole (PROTONIX) 40 mg tablet   4. Need for influenza vaccination Z23 V04.81 INFLUENZA VIRUS VACCINE, PRESERVATIVE FREE SYRINGE, 3 YRS AND OLDER   5. Hypertension complicating diabetes (HCC) E11.59 250.80     I10  401.9    6. Atherosclerosis of aorta (HCC) I70.0 440.0    7. 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 Uncontrolled type 2 diabetes mellitus with hyperglycemia (HCC) E11.65 250.02    9. 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 Type 2 diabetes mellitus with diabetic neuropathy, with long-term current use of insulin (HCC) E11.40 250.60     Z79.4 357.2      V58.67    11. Long term (current) use of insulin (HCC) Z79.4 V58.67      Spent 15 min obtaining PA- spoke to Dundee L.  Was told to proceed.- was standard override.  Follow up one month      Author:  Lake Bells, MD 10/27/2018 7:11 PM

## 2018-10-27 NOTE — Progress Notes (Signed)
Kelsey [name] is a 67 y.o. female and presents with   Chief Complaint   Patient presents with   ??? Other   .  Patient has had elbow surgery= still not healing well.  Needs refill and prior auth on neurontin. Also would like refill on relafen and pantoprazole      Current Outpatient Medications   Medication Sig Dispense Refill   ??? nabumetone (RELAFEN) 500 mg tablet TAKE 1 TAB BY MOUTH TWO (2) TIMES A DAY. 60 Tab 0   ??? gabapentin (NEURONTIN) 300 mg capsule Take 3 Caps by mouth three (3) times daily. Max Daily Amount: 2,700 mg. 270 Cap 1   ??? pantoprazole (PROTONIX) 40 mg tablet TAKE 1 TABLET BY MOUTH DAILY 90 Tab 0   ??? pravastatin (PRAVACHOL) 40 mg tablet TAKE 1 TAB BY MOUTH NIGHTLY. 90 Tab 3   ??? doxycycline (ADOXA) 100 mg tablet Take 1 Tab by mouth two (2) times a day. 20 Tab 0   ??? lubiPROStone (AMITIZA) 24 mcg capsule Take 24 mcg by mouth two (2) times daily (with meals).     ??? cholecalciferol (VITAMIN D3) 2,000 unit cap capsule TAKE 1 CAPSULE BY MOUTH EVERY DAY 90 Cap 3   ??? carvedilol (COREG) 12.5 mg tablet TAKE 1 TABLET BY MOUTH TWICE A DAY (Patient taking differently: 25 mg.) 180 Tab 0   ??? famotidine (PEPCID) 40 mg tablet TAKE 1 TABLET BY MOUTH TWICE A DAY 60 Tab 1   ??? losartan (COZAAR) 25 mg tablet TAKE 1 TAB BY MOUTH DAILY. 30 Tab 3   ??? aspirin delayed-release 81 mg tablet TAKE 1 TAB BY MOUTH DAILY. 90 Tab 3   ??? DULoxetine (CYMBALTA) 60 mg capsule TAKE 1 CAPSULE BY MOUTH DAILY 90 Cap 2   ??? clopidogrel (PLAVIX) 75 mg tab TAKE 1 TABLET BY MOUTH DAILY 30 Tab 5   ??? metFORMIN ER (GLUCOPHAGE XR) 500 mg tablet TAKE 2 TABLETS BY MOUTH IN THE MORNING WITH BREAKFAST AND 3 TABLETS BY MOUTH AT BEDTIME (Patient taking differently: Reports 2 tabs am & pm) 450 Tab 3   ??? ezetimibe (ZETIA) 10 mg tablet TAKE 1 TAB BY MOUTH NIGHTLY. 90 Tab 3   ??? LEVEMIR FLEXTOUCH U-100 INSULN 100 unit/mL (3 mL) inpn INJECT 34 UNITS UNDER THE SKIN NIGHTLY AT BEDTIME 45 Adjustable Dose Pre-filled Pen Syringe 1    ??? lidocaine (LIDODERM) 5 % Apply patch to the affected area for 12 hours a day and remove for 12 hours a day. (Patient taking differently: 1 Patch by TransDERmal route daily as needed. Apply patch to the affected area for 12 hours a day and remove for 12 hours a day.) 10 Each 2   ??? magnesium oxide (MAG-OX) 400 mg tablet Take 1 Tab by mouth daily. 90 Tab 3   ??? VESICARE 10 mg tablet TAKE 1 TABLET BY MOUTH DAILY. 90 Tab 4   ??? nitroglycerin (NITROSTAT) 0.4 mg SL tablet 1 Tab by SubLINGual route every five (5) minutes as needed for Chest Pain for up to 3 doses. 1 Bottle 1   ??? bumetanide (BUMEX) 2 mg tablet Take 2 mg by mouth daily.       Allergies   Allergen Reactions   ??? Morphine Anaphylaxis     Tolerates oxycodone without problem   ??? Soap Rash   ??? Avalide [Irbesartan-Hydrochlorothiazide] Myalgia   ??? Betadine [Povidone-Iodine] Rash   ??? Demerol [Meperidine] Other (comments)     Makes her feel crazy   ???  Pcn [Penicillins] Swelling   ??? Prinivil [Lisinopril] Cough   ??? Statins-Hmg-Coa Reductase Inhibitors Myalgia   ??? Sulfa (Sulfonamide Antibiotics) Unknown (comments)   ??? Tricor [Fenofibrate Micronized] Myalgia     Past Medical History:   Diagnosis Date   ??? Acute MI (HCC)     x3   ??? 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 Arthritis    ??? CAD (coronary artery disease)    ??? Cervical spondylolysis    ??? Chronic pain    ??? COPD    ??? Diabetes (HCC)    ??? GERD (gastroesophageal reflux disease)    ??? Goiter 04/20/2012   ??? Hypertension    ??? Neuropathy 04/06/2013     Past Surgical History:   Procedure Laterality Date   ??? 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 CARDIAC SURG PROCEDURE UNLIST      PTCA   ??? CARDIAC SURG PROCEDURE UNLIST  1999    3 STENTS PLACED   ??? CARDIAC SURG PROCEDURE UNLIST      2 ILIAC STENTS   ??? COLONOSCOPY N/A 07/22/2018    COLONOSCOPY performed by Erling Conte., MD at [facility] ENDOSCOPY   ??? HX KNEE ARTHROSCOPY Right     X2   ??? HX ORTHOPAEDIC  2018    LTH    ??? HX ORTHOPAEDIC Right     ROTATOR CUFF REPAIR   ??? HX OTHER SURGICAL Right 1970    GANGLION CYST WRIST   ??? HX TONSILLECTOMY  1963     Family History   Problem Relation Age of Onset   ??? Lung Disease Mother 4   ??? Other Mother         ANEURYSM   ??? Stroke Father    ??? Heart Disease Father    ??? Lung Disease Brother 69        COPD   ??? Hypertension Brother    ??? Cancer Brother         PROSTATE   ??? Heart Disease Brother    ??? Diabetes Brother    ??? Other Brother         SEPSIS   ??? No Known Problems Paternal Grandmother    ??? Anesth Problems Neg Hx      Social History     Tobacco Use   ??? Smoking status: Former Smoker     Packs/day: 0.30     Years: 45.00     Pack years: 13.50     Types: Cigarettes   ??? Smokeless tobacco: Never Used   ??? Tobacco comment: quit around the first of November   Substance Use Topics   ??? Alcohol use: Yes     Frequency: Monthly or less     Drinks per session: 1 or 2     Binge frequency: Never     Comment: may be 3 times a year if that      The patient has a history of falls. A plan of care for falls was documented..  Depression screen positive, medication was prescribed, drug therapy education given.      Objective:  Visit Vitals  BP 130/60   Ht 5\' 4"  (1.626 m)   Wt 181 lb (82.1 kg)   BMI 31.07 kg/m??     wdwn 67 yo wf  In NAD. A&O.  HEENT -- Pupils round.  O/P Clear.  Neck -- Supple. No  JVD.  Heart -- RRR. No R/M/G.  Lungs -- Coarse breath sounds  Abdomen -- Soft. Non-tender. Non-distended. No masses. Bowel sounds present.  Extremities -- No edema.  Left arm in cast.        Assessment/Plan:    ICD-10-CM ICD-9-CM    1. Neuropathy G62.9 355.9 nabumetone (RELAFEN) 500 mg tablet      gabapentin (NEURONTIN) 300 mg capsule   2. Chronic low back pain M54.5 724.2     G89.29 338.29    3. Gastroesophageal reflux disease without esophagitis K21.9 530.81 pantoprazole (PROTONIX) 40 mg tablet   4. Need for influenza vaccination Z23 V04.81 INFLUENZA VIRUS VACCINE, PRESERVATIVE FREE SYRINGE, 3 YRS AND OLDER    5. Hypertension complicating diabetes (HCC) E11.59 250.80     I10 401.9    6. Atherosclerosis of aorta (HCC) I70.0 440.0    7. 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 Uncontrolled type 2 diabetes mellitus with hyperglycemia (HCC) E11.65 250.02    9. 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 Type 2 diabetes mellitus with diabetic neuropathy, with long-term current use of insulin (HCC) E11.40 250.60     Z79.4 357.2      V58.67    11. Long term (current) use of insulin (HCC) Z79.4 V58.67      Spent 15 min obtaining PA- spoke to Hilbert L.  Was told to proceed.- was standard override.  Follow up one month      Author:  Lake Bells, MD 10/27/2018 7:11 PM

## 2018-11-10 ENCOUNTER — Ambulatory Visit: Admit: 2018-11-10 | Attending: Internal Medicine | Primary: Internal Medicine

## 2018-11-10 ENCOUNTER — Ambulatory Visit: Attending: Internal Medicine | Primary: Internal Medicine

## 2018-11-10 DIAGNOSIS — E1159 Type 2 diabetes mellitus with other circulatory complications: Secondary | ICD-10-CM

## 2018-11-10 NOTE — Progress Notes (Signed)
Kelsey [name] is a 67 y.o. female and presents with   Chief Complaint   Patient presents with   ??? Follow-up   .  Having cataract surgery in December. Says she does not need any paperwork /physical preop.    Left arm slowly healing.  Moving to [place] soon.  Not checking blood sugars. Does not have meds with her.      Current Outpatient Medications   Medication Sig Dispense Refill   ??? nabumetone (RELAFEN) 500 mg tablet TAKE 1 TAB BY MOUTH TWO (2) TIMES A DAY. 60 Tab 0   ??? gabapentin (NEURONTIN) 300 mg capsule Take 3 Caps by mouth three (3) times daily. Max Daily Amount: 2,700 mg. 270 Cap 1   ??? pantoprazole (PROTONIX) 40 mg tablet TAKE 1 TABLET BY MOUTH DAILY 90 Tab 0   ??? pravastatin (PRAVACHOL) 40 mg tablet TAKE 1 TAB BY MOUTH NIGHTLY. 90 Tab 3   ??? doxycycline (ADOXA) 100 mg tablet Take 1 Tab by mouth two (2) times a day. 20 Tab 0   ??? lubiPROStone (AMITIZA) 24 mcg capsule Take 24 mcg by mouth two (2) times daily (with meals).     ??? cholecalciferol (VITAMIN D3) 2,000 unit cap capsule TAKE 1 CAPSULE BY MOUTH EVERY DAY 90 Cap 3   ??? carvedilol (COREG) 12.5 mg tablet TAKE 1 TABLET BY MOUTH TWICE A DAY (Patient taking differently: 25 mg.) 180 Tab 0   ??? famotidine (PEPCID) 40 mg tablet TAKE 1 TABLET BY MOUTH TWICE A DAY 60 Tab 1   ??? losartan (COZAAR) 25 mg tablet TAKE 1 TAB BY MOUTH DAILY. 30 Tab 3   ??? aspirin delayed-release 81 mg tablet TAKE 1 TAB BY MOUTH DAILY. 90 Tab 3   ??? DULoxetine (CYMBALTA) 60 mg capsule TAKE 1 CAPSULE BY MOUTH DAILY 90 Cap 2   ??? clopidogrel (PLAVIX) 75 mg tab TAKE 1 TABLET BY MOUTH DAILY 30 Tab 5   ??? metFORMIN ER (GLUCOPHAGE XR) 500 mg tablet TAKE 2 TABLETS BY MOUTH IN THE MORNING WITH BREAKFAST AND 3 TABLETS BY MOUTH AT BEDTIME (Patient taking differently: Reports 2 tabs am & pm) 450 Tab 3   ??? ezetimibe (ZETIA) 10 mg tablet TAKE 1 TAB BY MOUTH NIGHTLY. 90 Tab 3   ??? LEVEMIR FLEXTOUCH U-100 INSULN 100 unit/mL (3 mL) inpn INJECT 34 UNITS UNDER THE SKIN NIGHTLY AT BEDTIME 45 Adjustable Dose  Pre-filled Pen Syringe 1   ??? lidocaine (LIDODERM) 5 % Apply patch to the affected area for 12 hours a day and remove for 12 hours a day. (Patient taking differently: 1 Patch by TransDERmal route daily as needed. Apply patch to the affected area for 12 hours a day and remove for 12 hours a day.) 10 Each 2   ??? magnesium oxide (MAG-OX) 400 mg tablet Take 1 Tab by mouth daily. 90 Tab 3   ??? VESICARE 10 mg tablet TAKE 1 TABLET BY MOUTH DAILY. 90 Tab 4   ??? nitroglycerin (NITROSTAT) 0.4 mg SL tablet 1 Tab by SubLINGual route every five (5) minutes as needed for Chest Pain for up to 3 doses. 1 Bottle 1   ??? bumetanide (BUMEX) 2 mg tablet Take 2 mg by mouth daily.       Allergies   Allergen Reactions   ??? Morphine Anaphylaxis     Tolerates oxycodone without problem   ??? Soap Rash   ??? Avalide [Irbesartan-Hydrochlorothiazide] Myalgia   ??? Betadine [Povidone-Iodine] Rash   ??? Demerol [Meperidine] Other (  comments)     Makes her feel crazy   ??? Pcn [Penicillins] Swelling   ??? Prinivil [Lisinopril] Cough   ??? Statins-Hmg-Coa Reductase Inhibitors Myalgia   ??? Sulfa (Sulfonamide Antibiotics) Unknown (comments)   ??? Tricor [Fenofibrate Micronized] Myalgia     Past Medical History:   Diagnosis Date   ??? Acute MI (HCC)     x3   ??? 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 Arthritis    ??? CAD (coronary artery disease)    ??? Cervical spondylolysis    ??? Chronic pain    ??? COPD    ??? Diabetes (HCC)    ??? GERD (gastroesophageal reflux disease)    ??? Goiter 04/20/2012   ??? Hypertension    ??? Neuropathy 04/06/2013     Past Surgical History:   Procedure Laterality Date   ??? 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 CARDIAC SURG PROCEDURE UNLIST      PTCA   ??? CARDIAC SURG PROCEDURE UNLIST  1999    3 STENTS PLACED   ??? CARDIAC SURG PROCEDURE UNLIST      2 ILIAC STENTS   ??? COLONOSCOPY N/A 07/22/2018    COLONOSCOPY [facility] by Erling Contealreja, Jayant P[facility]   ??? HX KNEE ARTHROSCOPY Right     X2   ??? HX ORTHOPAEDIC  2018     LTH   ??? HX ORTHOPAEDIC Right     ROTATOR CUFF REPAIR   ??? HX OTHER SURGICAL Right 1970    GANGLION CYST WRIST   ??? HX TONSILLECTOMY  1963     Family History   Problem Relation Age of Onset   ??? Lung Disease Mother 4684   ??? Other Mother         ANEURYSM   ??? Stroke Father    ??? Heart Disease Father    ??? Lung Disease Brother 5776        COPD   ??? Hypertension Brother    ??? Cancer Brother         PROSTATE   ??? Heart Disease Brother    ??? Diabetes Brother    ??? Other Brother         SEPSIS   ??? No Known Problems Paternal Grandmother    ??? Anesth Problems Neg Hx      Social History     Tobacco Use   ??? Smoking status: Former Smoker     Packs/day: 0.30     Years: 45.00     Pack years: 13.50     Types: Cigarettes   ??? Smokeless tobacco: Never Used   ??? Tobacco comment: quit around the first of November   Substance Use Topics   ??? Alcohol use: Yes     Frequency: Monthly or less     Drinks per session: 1 or 2     Binge frequency: Never     Comment: may be 3 times a year if that      The patient has a history of falls. A plan of care for falls was documented..  Depression screen positive, medication was prescribed, drug therapy education given.      Objective:  Visit Vitals  BP 120/80   Ht 5\' 4"  (1.626 m)   Wt 187 lb (84.8 kg)   BMI 32.10 kg/m??     wdwn 67 yo wf  In NAD. A&O.  HEENT -- Pupils round.  O/P Clear.  Neck -- Supple. No JVD.  Heart -- RRR. No R/M/G.  Lungs -- CTA.  Abdomen -- Soft. Non-tender. Non-distended. No masses. Bowel sounds present.  Extremities -- No edema. Poor pulses,  No lesions        Assessment/Plan:    ICD-10-CM ICD-9-CM    1. Hypertension complicating diabetes (HCC) E11.59 250.80     I10 401.9    2. Atherosclerosis of aorta (HCC) I70.0 440.0    3. 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 Uncontrolled type 2 diabetes mellitus with hyperglycemia (HCC) E11.65 250.02    5. Type 2 diabetes mellitus with diabetic neuropathy, with long-term current use of insulin (HCC) E11.40 250.60      Z79.4 357.2      V58.67    6. Long term (current) use of insulin (HCC) Z79.4 V58.67    7. Severe obesity with body mass index (BMI) of 35.0 to 39.9 with serious comorbidity (HCC) E66.01 278.01    follow up 3 months.  shingrix        Author:  Lake Bells, MD 11/10/2018 6:40 PM

## 2018-11-10 NOTE — Progress Notes (Signed)
Kelsey [name] is a 67 y.o. female and presents with   Chief Complaint   Patient presents with   ??? Follow-up   .  Having cataract surgery in December. Says she does not need any paperwork /physical preop.    Left arm slowly healing.  Moving to [place] soon.  Not checking blood sugars. Does not have meds with her.      Current Outpatient Medications   Medication Sig Dispense Refill   ??? nabumetone (RELAFEN) 500 mg tablet TAKE 1 TAB BY MOUTH TWO (2) TIMES A DAY. 60 Tab 0   ??? gabapentin (NEURONTIN) 300 mg capsule Take 3 Caps by mouth three (3) times daily. Max Daily Amount: 2,700 mg. 270 Cap 1   ??? pantoprazole (PROTONIX) 40 mg tablet TAKE 1 TABLET BY MOUTH DAILY 90 Tab 0   ??? pravastatin (PRAVACHOL) 40 mg tablet TAKE 1 TAB BY MOUTH NIGHTLY. 90 Tab 3   ??? doxycycline (ADOXA) 100 mg tablet Take 1 Tab by mouth two (2) times a day. 20 Tab 0   ??? lubiPROStone (AMITIZA) 24 mcg capsule Take 24 mcg by mouth two (2) times daily (with meals).     ??? cholecalciferol (VITAMIN D3) 2,000 unit cap capsule TAKE 1 CAPSULE BY MOUTH EVERY DAY 90 Cap 3   ??? carvedilol (COREG) 12.5 mg tablet TAKE 1 TABLET BY MOUTH TWICE A DAY (Patient taking differently: 25 mg.) 180 Tab 0   ??? famotidine (PEPCID) 40 mg tablet TAKE 1 TABLET BY MOUTH TWICE A DAY 60 Tab 1   ??? losartan (COZAAR) 25 mg tablet TAKE 1 TAB BY MOUTH DAILY. 30 Tab 3   ??? aspirin delayed-release 81 mg tablet TAKE 1 TAB BY MOUTH DAILY. 90 Tab 3   ??? DULoxetine (CYMBALTA) 60 mg capsule TAKE 1 CAPSULE BY MOUTH DAILY 90 Cap 2   ??? clopidogrel (PLAVIX) 75 mg tab TAKE 1 TABLET BY MOUTH DAILY 30 Tab 5   ??? metFORMIN ER (GLUCOPHAGE XR) 500 mg tablet TAKE 2 TABLETS BY MOUTH IN THE MORNING WITH BREAKFAST AND 3 TABLETS BY MOUTH AT BEDTIME (Patient taking differently: Reports 2 tabs am & pm) 450 Tab 3   ??? ezetimibe (ZETIA) 10 mg tablet TAKE 1 TAB BY MOUTH NIGHTLY. 90 Tab 3   ??? LEVEMIR FLEXTOUCH U-100 INSULN 100 unit/mL (3 mL) inpn INJECT 34 UNITS  UNDER THE SKIN NIGHTLY AT BEDTIME 45 Adjustable Dose Pre-filled Pen Syringe 1   ??? lidocaine (LIDODERM) 5 % Apply patch to the affected area for 12 hours a day and remove for 12 hours a day. (Patient taking differently: 1 Patch by TransDERmal route daily as needed. Apply patch to the affected area for 12 hours a day and remove for 12 hours a day.) 10 Each 2   ??? magnesium oxide (MAG-OX) 400 mg tablet Take 1 Tab by mouth daily. 90 Tab 3   ??? VESICARE 10 mg tablet TAKE 1 TABLET BY MOUTH DAILY. 90 Tab 4   ??? nitroglycerin (NITROSTAT) 0.4 mg SL tablet 1 Tab by SubLINGual route every five (5) minutes as needed for Chest Pain for up to 3 doses. 1 Bottle 1   ??? bumetanide (BUMEX) 2 mg tablet Take 2 mg by mouth daily.       Allergies   Allergen Reactions   ??? Morphine Anaphylaxis     Tolerates oxycodone without problem   ??? Soap Rash   ??? Avalide [Irbesartan-Hydrochlorothiazide] Myalgia   ??? Betadine [Povidone-Iodine] Rash   ??? Demerol [Meperidine] Other (  comments)     Makes her feel crazy   ??? Pcn [Penicillins] Swelling   ??? Prinivil [Lisinopril] Cough   ??? Statins-Hmg-Coa Reductase Inhibitors Myalgia   ??? Sulfa (Sulfonamide Antibiotics) Unknown (comments)   ??? Tricor [Fenofibrate Micronized] Myalgia     Past Medical History:   Diagnosis Date   ??? Acute MI (HCC)     x3   ??? 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 Arthritis    ??? CAD (coronary artery disease)    ??? Cervical spondylolysis    ??? Chronic pain    ??? COPD    ??? Diabetes (HCC)    ??? GERD (gastroesophageal reflux disease)    ??? Goiter 04/20/2012   ??? Hypertension    ??? Neuropathy 04/06/2013     Past Surgical History:   Procedure Laterality Date   ??? 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 CARDIAC SURG PROCEDURE UNLIST      PTCA   ??? CARDIAC SURG PROCEDURE UNLIST  1999    3 STENTS PLACED   ??? CARDIAC SURG PROCEDURE UNLIST      2 ILIAC STENTS   ??? COLONOSCOPY N/A 07/22/2018    COLON[facility] Erling Contealreja, [facility] P[facility]    ??? HX KNEE ARTHROSCOPY Right     X2   ??? HX ORTHOPAEDIC  2018    [facility]   ??? HX ORTHOPAEDIC Right     ROTATOR CUFF REPAIR   ??? HX OTHER SURGICAL Right 1970    GANGLION CYST WRIST   ??? HX TONSILLECTOMY  1963     Family History   Problem Relation Age of Onset   ??? Lung Disease Mother 7084   ??? Other Mother         ANEURYSM   ??? Stroke Father    ??? Heart Disease Father    ??? Lung Disease Brother 376        COPD   ??? Hypertension Brother    ??? Cancer Brother         PROSTATE   ??? Heart Disease Brother    ??? Diabetes Brother    ??? Other Brother         SEPSIS   ??? No Known Problems Paternal Grandmother    ??? Anesth Problems Neg Hx      Social History     Tobacco Use   ??? Smoking status: Former Smoker     Packs/day: 0.30     Years: 45.00     Pack years: 13.50     Types: Cigarettes   ??? Smokeless tobacco: Never Used   ??? Tobacco comment: quit around the first of November   Substance Use Topics   ??? Alcohol use: Yes     Frequency: Monthly or less     Drinks per session: 1 or 2     Binge frequency: Never     Comment: may be 3 times a year if that      The patient has a history of falls. A plan of care for falls was documented..  Depression screen positive, medication was prescribed, drug therapy education given.      Objective:  Visit Vitals  BP 120/80   Ht 5\' 4"  (1.626 m)   Wt 187 lb (84.8 kg)   BMI 32.10 kg/m??     wdwn [age] yo wf  In NAD. A&O.  HEENT -- Pupils round.  O/P Clear.  Neck -- Supple. No JVD.  Heart -- RRR. No R/M/G.  Lungs -- CTA.  Abdomen -- Soft. Non-tender. Non-distended. No masses. Bowel sounds present.  Extremities -- No edema. Poor pulses,  No lesions        Assessment/Plan:    ICD-10-CM ICD-9-CM    1. Hypertension complicating diabetes (HCC) E11.59 250.80     I10 401.9    2. Atherosclerosis of aorta (HCC) I70.0 440.0    3. 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 Uncontrolled type 2 diabetes mellitus with hyperglycemia (HCC) E11.65 250.02     5. Type 2 diabetes mellitus with diabetic neuropathy, with long-term current use of insulin (HCC) E11.40 250.60     Z79.4 357.2      V58.67    6. Long term (current) use of insulin (HCC) Z79.4 V58.67    7. Severe obesity with body mass index (BMI) of 35.0 to 39.9 with serious comorbidity (HCC) E66.01 278.01    follow up 3 months.  shingrix        Author:  [place] Bells, MD 11/10/2018 6:40 PM

## 2018-11-22 MED ORDER — SOLIFENACIN 10 MG TAB
10 mg | ORAL_TABLET | ORAL | 4 refills | Status: DC
Start: 2018-11-22 — End: 2019-03-31

## 2018-11-22 MED ORDER — CLOPIDOGREL 75 MG TAB
75 mg | ORAL_TABLET | ORAL | 11 refills | Status: DC
Start: 2018-11-22 — End: 2019-03-31

## 2018-11-23 ENCOUNTER — Encounter

## 2018-11-23 MED ORDER — NABUMETONE 500 MG TAB
500 mg | ORAL_TABLET | ORAL | 0 refills | Status: DC
Start: 2018-11-23 — End: 2018-12-29

## 2018-12-01 ENCOUNTER — Ambulatory Visit: Admit: 2018-12-01 | Attending: Internal Medicine | Primary: Internal Medicine

## 2018-12-01 ENCOUNTER — Ambulatory Visit: Attending: Internal Medicine | Primary: Internal Medicine

## 2018-12-01 DIAGNOSIS — E1159 Type 2 diabetes mellitus with other circulatory complications: Secondary | ICD-10-CM

## 2018-12-01 LAB — AMB POC URINALYSIS DIP STICK AUTO W/ MICRO
Bilirubin (UA POC): NEGATIVE
Bilirubin, Urine, POC: NEGATIVE
Blood (UA POC): NEGATIVE
Blood (UA POC): NEGATIVE
Glucose (UA POC): NEGATIVE
Glucose, Urine, POC: NEGATIVE
Ketones (UA POC): NEGATIVE
Ketones, Urine, POC: NEGATIVE
Nitrite, Urine, POC: NEGATIVE
Nitrites (UA POC): NEGATIVE
Protein (UA POC): NEGATIVE
Protein, Urine, POC: NEGATIVE
Specific Gravity, Urine, POC: 1.01 NA (ref 1.001–1.035)
Specific gravity (UA POC): 1.01 (ref 1.001–1.035)
Urobilinogen (UA POC): 0.2 (ref 0.2–1)
Urobilinogen, POC: 0.2 (ref 0.2–1)
pH (UA POC): 5.5 (ref 4.6–8.0)
pH, Urine, POC: 5.5 NA (ref 4.6–8.0)

## 2018-12-01 LAB — AMB POC COMPLETE CBC,AUTOMATED ENTER
ABS. GRANS (POC): 7.6 10*3/uL — AB (ref 1.4–6.5)
ABS. LYMPHS (POC): 2.1 10*3/uL (ref 1.2–3.4)
ABS. MONOS (POC): 0.4 10*3/uL (ref 0.1–0.6)
GRANULOCYTES (POC): 75 % (ref 42.2–75.2)
Granulocytes %, POC: 75 % (ref 42.2–75.2)
Granulocytes Abs: 7.6 10*3/uL — AB (ref 1.4–6.5)
HCT (POC): 33.5 % — AB (ref 35.0–60.0)
HGB (POC): 10.9 g/dL — AB (ref 11–18)
Hematocrit, POC: 33.5 % — AB (ref 35.0–60.0)
Hemoglobin, POC: 10.9 g/dL — AB (ref 11–18)
LYMPHOCYTES (POC): 20.6 % (ref 20.5–51.1)
Lymphocyte %: 20.6 % (ref 20.5–51.1)
Lymphs Abs: 2.1 10*3/uL (ref 1.2–3.4)
MCH (POC): 26.9 pg — AB (ref 27.0–31.0)
MCH: 26.9 pg — AB (ref 27.0–31.0)
MCHC (POC): 32.5 g/dL — AB (ref 33.0–37.0)
MCHC: 32.5 g/dL — AB (ref 33.0–37.0)
MCV (POC): 83 fL (ref 80.0–99.9)
MCV: 83 fL (ref 80.0–99.9)
MONOCYTES (POC): 4.4 % (ref 1.7–9.3)
MPV (POC): 7.2 fL — AB (ref 7.8–11)
MPV POC: 7.2 fL — AB (ref 7.8–11)
Monocyte %: 4.4 % (ref 1.7–9.3)
Monocyte Absolute, POC: 0.4 10*3/uL (ref 0.1–0.6)
PLATELET (POC): 302 10*3/uL (ref 150–450)
Platelet Count, POC: 302 10*3/uL (ref 150–450)
RBC (POC): 4.04 M/uL (ref 4.00–6.00)
RBC, POC: 4.04 M/uL (ref 4.00–6.00)
RDW (POC): 15.5 % — AB (ref 11.6–13.7)
RDW, POC: 15.5 % — AB (ref 11.6–13.7)
WBC (POC): 10.2 10*3/uL (ref 4.5–10.5)
WBC, POC: 10.2 10*3/uL (ref 4.5–10.5)

## 2018-12-01 LAB — AMB POC LIPID PROFILE
Cholesterol (POC): 125 mg/dL (ref 100–199)
Cholesterol, POC: 125 mg/dL (ref 100–199)
HDL Cholesterol (POC): 56 mg/dL (ref 35–150)
HDL Cholesterol, POC: 56 mg/dL (ref 35–150)
LDL Cholesterol (POC): 33 mg/dL (ref 0–129)
LDL Cholesterol, POC: 33 mg/dL (ref 0–129)
Non-HDL Goal (POC): 69
Non-HDL Goal, POC: 69 NA
TChol/HDL Ratio (POC): 2.2 CALC (ref 0.0–5.0)
TChol/HDL Ratio (POC): 2.2 CALC (ref 0.0–5.0)
Triglycerides (POC): 181 mg/dL — AB (ref 0–150)
Triglycerides, POC: 181 mg/dL — AB (ref 0–150)

## 2018-12-01 NOTE — Progress Notes (Signed)
History and Physical    Kelsey [name] is a 67 y.o. female presents for physical exam.  Feeling ok.    Past Medical History:   Diagnosis Date   ??? Acute MI ([place] [facility])     x3   ??? 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 Arthritis    ??? CAD (coronary artery disease)    ??? Cervical spondylolysis    ??? Chronic pain    ??? COPD    ??? Diabetes ([place])    ??? GERD (gastroesophageal reflux disease)    ??? Goiter 04/20/2012   ??? Hypertension    ??? Neuropathy 04/06/2013     Past Surgical History:   Procedure Laterality Date   ??? 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 CARDIAC SURG PROCEDURE UNLIST      PTCA   ??? CARDIAC SURG PROCEDURE UNLIST  1999    3 STENTS PLACED   ??? CARDIAC SURG PROCEDURE UNLIST      2 ILIAC STENTS   ??? COLONOSCOPY N/A 07/22/2018    COLONOSCOPY performed by Marlise Eves., MD at [facility] ENDOSCOPY   ??? HX KNEE ARTHROSCOPY Right     X2   ??? HX ORTHOPAEDIC  2018    [place]   ??? HX ORTHOPAEDIC Right     ROTATOR CUFF REPAIR   ??? HX OTHER SURGICAL Right 1970    GANGLION CYST WRIST   ??? HX TONSILLECTOMY  1963     Current Outpatient Medications   Medication Sig Dispense Refill   ??? nabumetone (RELAFEN) 500 mg tablet TAKE 1 TABLET BY MOUTH TWICE A DAY 60 Tab 0   ??? clopidogrel (PLAVIX) 75 mg tab TAKE 1 TABLET BY MOUTH EVERY DAY 30 Tab 11   ??? solifenacin (VESICARE) 10 mg tablet TAKE 1 TABLET BY MOUTH DAILY. 90 Tab 4   ??? gabapentin (NEURONTIN) 300 mg capsule Take 3 Caps by mouth three (3) times daily. Max Daily Amount: 2,700 mg. 270 Cap 1   ??? pantoprazole (PROTONIX) 40 mg tablet TAKE 1 TABLET BY MOUTH DAILY 90 Tab 0   ??? pravastatin (PRAVACHOL) 40 mg tablet TAKE 1 TAB BY MOUTH NIGHTLY. 90 Tab 3   ??? doxycycline (ADOXA) 100 mg tablet Take 1 Tab by mouth two (2) times a day. 20 Tab 0   ??? lubiPROStone (AMITIZA) 24 mcg capsule Take 24 mcg by mouth two (2) times daily (with meals).     ??? cholecalciferol (VITAMIN D3) 2,000 unit cap capsule TAKE 1 CAPSULE BY MOUTH EVERY DAY 90 Cap 3   ??? carvedilol (COREG) 12.5 mg  tablet TAKE 1 TABLET BY MOUTH TWICE A DAY (Patient taking differently: 25 mg.) 180 Tab 0   ??? losartan (COZAAR) 25 mg tablet TAKE 1 TAB BY MOUTH DAILY. 30 Tab 3   ??? aspirin delayed-release 81 mg tablet TAKE 1 TAB BY MOUTH DAILY. 90 Tab 3   ??? DULoxetine (CYMBALTA) 60 mg capsule TAKE 1 CAPSULE BY MOUTH DAILY 90 Cap 2   ??? metFORMIN ER (GLUCOPHAGE XR) 500 mg tablet TAKE 2 TABLETS BY MOUTH IN THE MORNING WITH BREAKFAST AND 3 TABLETS BY MOUTH AT BEDTIME (Patient taking differently: Reports 2 tabs am & pm) 450 Tab 3   ??? ezetimibe (ZETIA) 10 mg tablet TAKE 1 TAB BY MOUTH NIGHTLY. 90 Tab 3   ??? LEVEMIR FLEXTOUCH U-100 INSULN 100 unit/mL (3 mL) inpn INJECT 34 UNITS UNDER  THE SKIN NIGHTLY AT BEDTIME 45 Adjustable Dose Pre-filled Pen Syringe 1   ??? lidocaine (LIDODERM) 5 % Apply patch to the affected area for 12 hours a day and remove for 12 hours a day. (Patient taking differently: 1 Patch by TransDERmal route daily as needed. Apply patch to the affected area for 12 hours a day and remove for 12 hours a day.) 10 Each 2   ??? magnesium oxide (MAG-OX) 400 mg tablet Take 1 Tab by mouth daily. 90 Tab 3   ??? nitroglycerin (NITROSTAT) 0.4 mg SL tablet 1 Tab by SubLINGual route every five (5) minutes as needed for Chest Pain for up to 3 doses. 1 Bottle 1   ??? bumetanide (BUMEX) 2 mg tablet Take 2 mg by mouth daily.       Allergies   Allergen Reactions   ??? Morphine Anaphylaxis     Tolerates oxycodone without problem   ??? Soap Rash   ??? Avalide [Irbesartan-Hydrochlorothiazide] Myalgia   ??? Betadine [Povidone-Iodine] Rash   ??? Demerol [Meperidine] Other (comments)     Makes her feel crazy   ??? Pcn [Penicillins] Swelling   ??? Prinivil [Lisinopril] Cough   ??? Statins-Hmg-Coa Reductase Inhibitors Myalgia   ??? Sulfa (Sulfonamide Antibiotics) Unknown (comments)   ??? Tricor [Fenofibrate Micronized] Myalgia     Social History     Socioeconomic History   ??? Marital status: MARRIED     Spouse name: Not on file   ??? Number of children: Not on file   ??? Years of  education: Not on file   ??? Highest education level: Not on file   Tobacco Use   ??? Smoking status: Former Smoker     Packs/day: 0.30     Years: 45.00     Pack years: 13.50     Types: Cigarettes   ??? Smokeless tobacco: Never Used   ??? Tobacco comment: quit around the first of November   Substance and Sexual Activity   ??? Alcohol use: Yes     Frequency: Monthly or less     Drinks per session: 1 or 2     Binge frequency: Never     Comment: may be 3 times a year if that   ??? Drug use: No     Family History   Problem Relation Age of Onset   ??? Lung Disease Mother 63   ??? Other Mother         ANEURYSM   ??? Stroke Father    ??? Heart Disease Father    ??? Lung Disease Brother 72        COPD   ??? Hypertension Brother    ??? Cancer Brother         PROSTATE   ??? Heart Disease Brother    ??? Diabetes Brother    ??? Other Brother         SEPSIS   ??? No Known Problems Paternal Grandmother    ??? Anesth Problems Neg Hx      The patient does not have a history of falls. A plan of care for falls was documented..  Depression screen positive, medication was prescribed, drug therapy education given.    Review of Systems:  Denies dysphagia, chest pain, has chronic SOB.- worse bending over  No nausea or vomiting, or diarrhea or constipation.  No fever, chills, night sweats, or cough.  No dysuria, frequency or abdominal pain.  No headaches.   Carotids followed by Dr. [name]  Mammogram- due  Bone density- normal in 2015  Colon 07/2018- neg but inadequate prep so follow up one year.    Yearly eye exam:   yes  Yearly dental exam: yes    Objective  Visit Vitals  BP 130/80   Ht '5\' 4"'  (1.626 m)   Wt 188 lb (85.3 kg)   BMI 32.27 kg/m??     General appearance - well developed, well nourished 67 y.o. wf  Eyes - PERRL  Tm- intact  Pharynx- no lesions  Neck --Supple, no anterior cervical nodes, no thyromegaly.? Bruits  Lungs --CTA  Heart - Regular rate and rhythm 1/6 asm  Abdomen - soft, no tenderness or distention  Breasts - fibrous areas bilat, no axillary nodes  Pelvic -  {not indicated  Extremities - no clubbing, cyanosis or edema  +1 pulses, no lesions  Adequate sensation    Results for orders placed or performed in visit on 12/01/18   AMB POC LIPID PROFILE   Result Value Ref Range    Cholesterol (POC) 125 100 - 199 mg/dL    Triglycerides (POC) 181 (A) 0 - 150 mg/dL    HDL Cholesterol (POC) 56 35 - 150 mg/dL    LDL Cholesterol (POC) 33 0 - 129 mg/dL    Non-HDL Goal (POC) 69     TChol/HDL Ratio (POC) 2.2 0.0 - 5.0 CALC   AMB POC COMPLETE CBC,AUTOMATED ENTER   Result Value Ref Range    WBC (POC) 10.2 4.5 - 10.5 K/uL    LYMPHOCYTES (POC) 20.6 20.5 - 51.1 %    MONOCYTES (POC) 4.4 1.7 - 9.3 %    GRANULOCYTES (POC) 75.0 42.2 - 75.2 %    ABS. LYMPHS (POC) 2.1 1.2 - 3.4 K/uL    ABS. MONOS (POC) 0.4 0.1 - 0.6 10^3/ul    ABS. GRANS (POC) 7.6 (A) 1.4 - 6.5 10^3/ul    RBC (POC) 4.04 4.00 - 6.00 M/uL    HGB (POC) 10.9 (A) 11 - 18 g/dL    HCT (POC) 33.5 (A) 35.0 - 60.0 %    MCV (POC) 83.0 80.0 - 99.9 fL    MCH (POC) 26.9 (A) 27.0 - 31.0 pg    MCHC (POC) 32.5 (A) 33.0 - 37.0 g/dL    RDW (POC) 15.5 (A) 11.6 - 13.7 %    PLATELET (POC) 302 150 - 450 K/uL    MPV (POC) 7.2 (A) 7.8 - 11 fL   AMB POC URINALYSIS DIP STICK AUTO W/ MICRO   Result Value Ref Range    Color (UA POC) Yellow     Clarity (UA POC) Clear     Glucose (UA POC) Negative Negative    Bilirubin (UA POC) Negative Negative    Ketones (UA POC) Negative Negative    Specific gravity (UA POC) 1.010 1.001 - 1.035    Blood (UA POC) Negative Negative    pH (UA POC) 5.5 4.6 - 8.0    Protein (UA POC) Negative Negative    Urobilinogen (UA POC) 0.2 mg/dL 0.2 - 1    Nitrites (UA POC) Negative Negative    Leukocyte esterase (UA POC) 1+ Negative         Assessment/Plan      ICD-10-CM ICD-9-CM    1. Hypertension complicating diabetes ([place]) E11.59 250.80 COLLECTION VENOUS BLOOD,VENIPUNCTURE    I10 401.9 CANCELED: AMB POC URINALYSIS DIP STICK MANUAL W/O MICRO   2. Atherosclerosis of aorta (HCC) I70.0 440.0    3. 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 Uncontrolled type 2 diabetes mellitus with hyperglycemia (HCC) E11.65 250.02    5. Type 2 diabetes mellitus with diabetic neuropathy, with long-term current use of insulin (HCC) E11.40 250.60 AMB POC COMPLETE CBC,AUTOMATED ENTER    Z79.4 357.2      V58.67    6. 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 Severe obesity with body mass index (BMI) of 35.0 to 39.9 with serious comorbidity (HCC) E66.01 278.01    8. Long term (current) use of insulin (HCC) Z79.4 V58.67    9. Mixed hyperlipidemia E78.2 272.2 AMB POC LIPID PROFILE      TSH 3RD GENERATION      METABOLIC PANEL, COMPREHENSIVE      AMB POC URINALYSIS DIP STICK AUTO W/ MICRO   10. Severe tobacco dependence F17.200 305.1 CT LOW DOSE LUNG CANCER SCREENING   11. Personal history of nicotine dependence  Z87.891 V15.82 CT LOW DOSE LUNG CANCER SCREENING   follow up Dr. [name]  Ct lung  Follow up Dr. Marlon Pel  Mammogram  Mail rx shingrix      AUTHOR:  Genene Churn, MD 12/01/2018 9:15 AM

## 2018-12-01 NOTE — Progress Notes (Signed)
History and Physical    Kelsey [name] is a 67 y.o. female presents for physical exam.  Feeling ok.    Past Medical History:   Diagnosis Date   ??? Acute MI ([place])     x3   ??? 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 Arthritis    ??? CAD (coronary artery disease)    ??? Cervical spondylolysis    ??? Chronic pain    ??? COPD    ??? Diabetes ([place])    ??? GERD (gastroesophageal reflux disease)    ??? Goiter 04/20/2012   ??? Hypertension    ??? Neuropathy 04/06/2013     Past Surgical History:   Procedure Laterality Date   ??? 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 CARDIAC SURG PROCEDURE UNLIST      PTCA   ??? CARDIAC SURG PROCEDURE UNLIST  1999    3 STENTS PLACED   ??? CARDIAC SURG PROCEDURE UNLIST      2 ILIAC STENTS   ??? COLONOSCOPY N/A 07/22/2018    COLONOSCOPY performed by Marlise Eves., MD at [facility] ENDOSCOPY   ??? HX KNEE ARTHROSCOPY Right     X2   ??? HX ORTHOPAEDIC  2018    [place]   ??? HX ORTHOPAEDIC Right     ROTATOR CUFF REPAIR   ??? HX OTHER SURGICAL Right 1970    GANGLION CYST WRIST   ??? HX TONSILLECTOMY  1963     Current Outpatient Medications   Medication Sig Dispense Refill   ??? nabumetone (RELAFEN) 500 mg tablet TAKE 1 TABLET BY MOUTH TWICE A DAY 60 Tab 0   ??? clopidogrel (PLAVIX) 75 mg tab TAKE 1 TABLET BY MOUTH EVERY DAY 30 Tab 11   ??? solifenacin (VESICARE) 10 mg tablet TAKE 1 TABLET BY MOUTH DAILY. 90 Tab 4   ??? gabapentin (NEURONTIN) 300 mg capsule Take 3 Caps by mouth three (3) times daily. Max Daily Amount: 2,700 mg. 270 Cap 1   ??? pantoprazole (PROTONIX) 40 mg tablet TAKE 1 TABLET BY MOUTH DAILY 90 Tab 0   ??? pravastatin (PRAVACHOL) 40 mg tablet TAKE 1 TAB BY MOUTH NIGHTLY. 90 Tab 3   ??? doxycycline (ADOXA) 100 mg tablet Take 1 Tab by mouth two (2) times a day. 20 Tab 0   ??? lubiPROStone (AMITIZA) 24 mcg capsule Take 24 mcg by mouth two (2) times daily (with meals).     ??? cholecalciferol (VITAMIN D3) 2,000 unit cap capsule TAKE 1 CAPSULE BY MOUTH EVERY DAY 90 Cap 3    ??? carvedilol (COREG) 12.5 mg tablet TAKE 1 TABLET BY MOUTH TWICE A DAY (Patient taking differently: 25 mg.) 180 Tab 0   ??? losartan (COZAAR) 25 mg tablet TAKE 1 TAB BY MOUTH DAILY. 30 Tab 3   ??? aspirin delayed-release 81 mg tablet TAKE 1 TAB BY MOUTH DAILY. 90 Tab 3   ??? DULoxetine (CYMBALTA) 60 mg capsule TAKE 1 CAPSULE BY MOUTH DAILY 90 Cap 2   ??? metFORMIN ER (GLUCOPHAGE XR) 500 mg tablet TAKE 2 TABLETS BY MOUTH IN THE MORNING WITH BREAKFAST AND 3 TABLETS BY MOUTH AT BEDTIME (Patient taking differently: Reports 2 tabs am & pm) 450 Tab 3   ??? ezetimibe (ZETIA) 10 mg tablet TAKE 1 TAB BY MOUTH NIGHTLY. 90 Tab 3   ??? LEVEMIR FLEXTOUCH U-100 INSULN 100 unit/mL (3 mL) inpn INJECT 34 UNITS UNDER  THE SKIN NIGHTLY AT BEDTIME 45 Adjustable Dose Pre-filled Pen Syringe 1   ??? lidocaine (LIDODERM) 5 % Apply patch to the affected area for 12 hours a day and remove for 12 hours a day. (Patient taking differently: 1 Patch by TransDERmal route daily as needed. Apply patch to the affected area for 12 hours a day and remove for 12 hours a day.) 10 Each 2   ??? magnesium oxide (MAG-OX) 400 mg tablet Take 1 Tab by mouth daily. 90 Tab 3   ??? nitroglycerin (NITROSTAT) 0.4 mg SL tablet 1 Tab by SubLINGual route every five (5) minutes as needed for Chest Pain for up to 3 doses. 1 Bottle 1   ??? bumetanide (BUMEX) 2 mg tablet Take 2 mg by mouth daily.       Allergies   Allergen Reactions   ??? Morphine Anaphylaxis     Tolerates oxycodone without problem   ??? Soap Rash   ??? Avalide [Irbesartan-Hydrochlorothiazide] Myalgia   ??? Betadine [Povidone-Iodine] Rash   ??? Demerol [Meperidine] Other (comments)     Makes her feel crazy   ??? Pcn [Penicillins] Swelling   ??? Prinivil [Lisinopril] Cough   ??? Statins-Hmg-Coa Reductase Inhibitors Myalgia   ??? Sulfa (Sulfonamide Antibiotics) Unknown (comments)   ??? Tricor [Fenofibrate Micronized] Myalgia     Social History     Socioeconomic History   ??? Marital status: MARRIED     Spouse name: Not on file    ??? Number of children: Not on file   ??? Years of education: Not on file   ??? Highest education level: Not on file   Tobacco Use   ??? Smoking status: Former Smoker     Packs/day: 0.30     Years: 45.00     Pack years: 13.50     Types: Cigarettes   ??? Smokeless tobacco: Never Used   ??? Tobacco comment: quit around the first of November   Substance and Sexual Activity   ??? Alcohol use: Yes     Frequency: Monthly or less     Drinks per session: 1 or 2     Binge frequency: Never     Comment: may be 3 times a year if that   ??? Drug use: No     Family History   Problem Relation Age of Onset   ??? Lung Disease Mother 80   ??? Other Mother         ANEURYSM   ??? Stroke Father    ??? Heart Disease Father    ??? Lung Disease Brother 75        COPD   ??? Hypertension Brother    ??? Cancer Brother         PROSTATE   ??? Heart Disease Brother    ??? Diabetes Brother    ??? Other Brother         SEPSIS   ??? No Known Problems Paternal Grandmother    ??? Anesth Problems Neg Hx      The patient does not have a history of falls. A plan of care for falls was documented..  Depression screen positive, medication was prescribed, drug therapy education given.    Review of Systems:  Denies dysphagia, chest pain, has chronic SOB.- worse bending over  No nausea or vomiting, or diarrhea or constipation.  No fever, chills, night sweats, or cough.  No dysuria, frequency or abdominal pain.  No headaches.   Carotids followed by Dr. [name]  Mammogram- due  Bone density- normal in 2015  Colon 07/2018- neg but inadequate prep so follow up one year.    Yearly eye exam:   yes  Yearly dental exam: yes    Objective  Visit Vitals  BP 130/80   Ht '5\' 4"'  (1.626 m)   Wt 188 lb (85.3 kg)   BMI 32.27 kg/m??     General appearance - well developed, well nourished 67 y.o. wf  Eyes - PERRL  Tm- intact  Pharynx- no lesions  Neck --Supple, no anterior cervical nodes, no thyromegaly.? Bruits  Lungs --CTA  Heart - Regular rate and rhythm 1/6 asm  Abdomen - soft, no tenderness or distention   Breasts - fibrous areas bilat, no axillary nodes  Pelvic - {not indicated  Extremities - no clubbing, cyanosis or edema  +1 pulses, no lesions  Adequate sensation    Results for orders placed or performed in visit on 12/01/18   AMB POC LIPID PROFILE   Result Value Ref Range    Cholesterol (POC) 125 100 - 199 mg/dL    Triglycerides (POC) 181 (A) 0 - 150 mg/dL    HDL Cholesterol (POC) 56 35 - 150 mg/dL    LDL Cholesterol (POC) 33 0 - 129 mg/dL    Non-HDL Goal (POC) 69     TChol/HDL Ratio (POC) 2.2 0.0 - 5.0 CALC   AMB POC COMPLETE CBC,AUTOMATED ENTER   Result Value Ref Range    WBC (POC) 10.2 4.5 - 10.5 K/uL    LYMPHOCYTES (POC) 20.6 20.5 - 51.1 %    MONOCYTES (POC) 4.4 1.7 - 9.3 %    GRANULOCYTES (POC) 75.0 42.2 - 75.2 %    ABS. LYMPHS (POC) 2.1 1.2 - 3.4 K/uL    ABS. MONOS (POC) 0.4 0.1 - 0.6 10^3/ul    ABS. GRANS (POC) 7.6 (A) 1.4 - 6.5 10^3/ul    RBC (POC) 4.04 4.00 - 6.00 M/uL    HGB (POC) 10.9 (A) 11 - 18 g/dL    HCT (POC) 33.5 (A) 35.0 - 60.0 %    MCV (POC) 83.0 80.0 - 99.9 fL    MCH (POC) 26.9 (A) 27.0 - 31.0 pg    MCHC (POC) 32.5 (A) 33.0 - 37.0 g/dL    RDW (POC) 15.5 (A) 11.6 - 13.7 %    PLATELET (POC) 302 150 - 450 K/uL    MPV (POC) 7.2 (A) 7.8 - 11 fL   AMB POC URINALYSIS DIP STICK AUTO W/ MICRO   Result Value Ref Range    Color (UA POC) Yellow     Clarity (UA POC) Clear     Glucose (UA POC) Negative Negative    Bilirubin (UA POC) Negative Negative    Ketones (UA POC) Negative Negative    Specific gravity (UA POC) 1.010 1.001 - 1.035    Blood (UA POC) Negative Negative    pH (UA POC) 5.5 4.6 - 8.0    Protein (UA POC) Negative Negative    Urobilinogen (UA POC) 0.2 mg/dL 0.2 - 1    Nitrites (UA POC) Negative Negative    Leukocyte esterase (UA POC) 1+ Negative         Assessment/Plan      ICD-10-CM ICD-9-CM    1. Hypertension complicating diabetes ([place]) E11.59 250.80 COLLECTION VENOUS BLOOD,VENIPUNCTURE    I10 401.9 CANCELED: AMB POC URINALYSIS DIP STICK MANUAL W/O MICRO    2. Atherosclerosis of aorta (HCC) I70.0 440.0    3. 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 Uncontrolled type 2 diabetes mellitus with hyperglycemia (HCC) E11.65 250.02    5. Type 2 diabetes mellitus with diabetic neuropathy, with long-term current use of insulin (HCC) E11.40 250.60 AMB POC COMPLETE CBC,AUTOMATED ENTER    Z79.4 357.2      V58.67    6. 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 Severe obesity with body mass index (BMI) of 35.0 to 39.9 with serious comorbidity (HCC) E66.01 278.01    8. Long term (current) use of insulin (HCC) Z79.4 V58.67    9. Mixed hyperlipidemia E78.2 272.2 AMB POC LIPID PROFILE      TSH 3RD GENERATION      METABOLIC PANEL, COMPREHENSIVE      AMB POC URINALYSIS DIP STICK AUTO W/ MICRO   10. Severe tobacco dependence F17.200 305.1 CT LOW DOSE LUNG CANCER SCREENING   11. Personal history of nicotine dependence  Z87.891 V15.82 CT LOW DOSE LUNG CANCER SCREENING   follow up Dr. [name]  Ct lung  Follow up Dr. Marlon Pel  Mammogram  Mail rx shingrix      AUTHOR:  Genene Churn, MD 12/01/2018 9:15 AM

## 2018-12-01 NOTE — Patient Instructions (Signed)
Results for orders placed or performed in visit on 12/01/18   AMB POC LIPID PROFILE   Result Value Ref Range    Cholesterol (POC) 125 100 - 199 mg/dL    Triglycerides (POC) 181 (A) 0 - 150 mg/dL    HDL Cholesterol (POC) 56 35 - 150 mg/dL    LDL Cholesterol (POC) 33 0 - 129 mg/dL    Non-HDL Goal (POC) 69     TChol/HDL Ratio (POC) 2.2 0.0 - 5.0 CALC   AMB POC COMPLETE CBC,AUTOMATED ENTER   Result Value Ref Range    WBC (POC) 10.2 4.5 - 10.5 K/uL    LYMPHOCYTES (POC) 20.6 20.5 - 51.1 %    MONOCYTES (POC) 4.4 1.7 - 9.3 %    GRANULOCYTES (POC) 75.0 42.2 - 75.2 %    ABS. LYMPHS (POC) 2.1 1.2 - 3.4 K/uL    ABS. MONOS (POC) 0.4 0.1 - 0.6 10^3/ul    ABS. GRANS (POC) 7.6 (A) 1.4 - 6.5 10^3/ul    RBC (POC) 4.04 4.00 - 6.00 M/uL    HGB (POC) 10.9 (A) 11 - 18 g/dL    HCT (POC) 16.133.5 (A) 09.635.0 - 60.0 %    MCV (POC) 83.0 80.0 - 99.9 fL    MCH (POC) 26.9 (A) 27.0 - 31.0 pg    MCHC (POC) 32.5 (A) 33.0 - 37.0 g/dL    RDW (POC) 04.515.5 (A) 40.911.6 - 13.7 %    PLATELET (POC) 302 150 - 450 K/uL    MPV (POC) 7.2 (A) 7.8 - 11 fL   AMB POC URINALYSIS DIP STICK AUTO W/ MICRO   Result Value Ref Range    Color (UA POC) Yellow     Clarity (UA POC) Clear     Glucose (UA POC) Negative Negative    Bilirubin (UA POC) Negative Negative    Ketones (UA POC) Negative Negative    Specific gravity (UA POC) 1.010 1.001 - 1.035    Blood (UA POC) Negative Negative    pH (UA POC) 5.5 4.6 - 8.0    Protein (UA POC) Negative Negative    Urobilinogen (UA POC) 0.2 mg/dL 0.2 - 1    Nitrites (UA POC) Negative Negative    Leukocyte esterase (UA POC) 1+ Negative

## 2018-12-02 ENCOUNTER — Encounter: Attending: Internal Medicine | Primary: Internal Medicine

## 2018-12-02 LAB — TSH 3RD GENERATION
TSH: 2.03 u[IU]/mL (ref 0.450–4.500)
TSH: 2.03 u[IU]/mL (ref 0.450–4.500)

## 2018-12-02 LAB — COMPREHENSIVE METABOLIC PANEL
ALT: 9 IU/L (ref 0–32)
AST: 13 IU/L (ref 0–40)
Albumin/Globulin Ratio: 1.7 NA (ref 1.2–2.2)
Albumin: 3.9 g/dL (ref 3.6–4.8)
Alkaline Phosphatase: 66 IU/L (ref 39–117)
BUN: 28 mg/dL — ABNORMAL HIGH (ref 8–27)
Bun/Cre Ratio: 28 NA (ref 12–28)
CO2: 23 mmol/L (ref 20–29)
Calcium: 9.2 mg/dL (ref 8.7–10.3)
Chloride: 97 mmol/L (ref 96–106)
Creatinine: 1.01 mg/dL — ABNORMAL HIGH (ref 0.57–1.00)
EGFR IF NonAfrican American: 58 mL/min/{1.73_m2} — ABNORMAL LOW (ref >59)
GFR African American: 67 mL/min/{1.73_m2} (ref >59)
Globulin, Total: 2.3 g/dL (ref 1.5–4.5)
Glucose: 129 mg/dL — ABNORMAL HIGH (ref 65–99)
Potassium: 4.5 mmol/L (ref 3.5–5.2)
Sodium: 137 mmol/L (ref 134–144)
Total Bilirubin: 0.2 mg/dL (ref 0.0–1.2)
Total Protein: 6.2 g/dL (ref 6.0–8.5)

## 2018-12-02 LAB — METABOLIC PANEL, COMPREHENSIVE
A-G Ratio: 1.7 (ref 1.2–2.2)
ALT (SGPT): 9 IU/L (ref 0–32)
AST (SGOT): 13 IU/L (ref 0–40)
Albumin: 3.9 g/dL (ref 3.6–4.8)
Alk. phosphatase: 66 IU/L (ref 39–117)
BUN/Creatinine ratio: 28 (ref 12–28)
BUN: 28 mg/dL — ABNORMAL HIGH (ref 8–27)
Bilirubin, total: 0.2 mg/dL (ref 0.0–1.2)
CO2: 23 mmol/L (ref 20–29)
Calcium: 9.2 mg/dL (ref 8.7–10.3)
Chloride: 97 mmol/L (ref 96–106)
Creatinine: 1.01 mg/dL — ABNORMAL HIGH (ref 0.57–1.00)
GFR est AA: 67 mL/min/{1.73_m2} (ref >59)
GFR est non-AA: 58 mL/min/{1.73_m2} — ABNORMAL LOW (ref >59)
GLOBULIN, TOTAL: 2.3 g/dL (ref 1.5–4.5)
Glucose: 129 mg/dL — ABNORMAL HIGH (ref 65–99)
Potassium: 4.5 mmol/L (ref 3.5–5.2)
Protein, total: 6.2 g/dL (ref 6.0–8.5)
Sodium: 137 mmol/L (ref 134–144)

## 2018-12-05 MED ORDER — LEVEMIR FLEXTOUCH U-100 INSULIN 100 UNIT/ML (3 ML) SUBCUTANEOUS PEN
100 unit/mL (3 mL) | SUBCUTANEOUS | 2 refills | Status: DC
Start: 2018-12-05 — End: 2019-03-10

## 2018-12-06 MED ORDER — CHANTIX CONTINUING MONTH BOX 1 MG TABLET
1 mg | ORAL_TABLET | ORAL | 3 refills | Status: DC
Start: 2018-12-06 — End: 2019-04-12

## 2018-12-09 ENCOUNTER — Encounter

## 2018-12-10 ENCOUNTER — Inpatient Hospital Stay: Payer: MEDICARE | Attending: Internal Medicine | Primary: Internal Medicine

## 2018-12-10 MED ORDER — CARVEDILOL 12.5 MG TAB
12.5 mg | ORAL_TABLET | ORAL | 3 refills | Status: DC
Start: 2018-12-10 — End: 2019-03-31

## 2018-12-17 ENCOUNTER — Inpatient Hospital Stay: Admit: 2018-12-17 | Payer: MEDICARE | Attending: Internal Medicine | Primary: Internal Medicine

## 2018-12-17 DIAGNOSIS — Z122 Encounter for screening for malignant neoplasm of respiratory organs: Secondary | ICD-10-CM

## 2018-12-17 NOTE — Progress Notes (Signed)
pls call- lung ct essentially stable small lymph node slightly bigger on right. Make sure mammogram is scheduled.

## 2018-12-17 NOTE — Progress Notes (Signed)
Called and left message.

## 2018-12-17 NOTE — Progress Notes (Signed)
Called and left message.

## 2018-12-17 NOTE — Progress Notes (Signed)
pls call- lung ct essentially stable small lymph node slightly bigger on right. Make sure mammogram is scheduled.

## 2018-12-29 ENCOUNTER — Encounter

## 2018-12-29 MED ORDER — NABUMETONE 500 MG TAB
500 mg | ORAL_TABLET | ORAL | 0 refills | Status: DC
Start: 2018-12-29 — End: 2019-03-31

## 2019-01-23 ENCOUNTER — Encounter

## 2019-01-23 MED ORDER — LOSARTAN 25 MG TAB
25 mg | ORAL_TABLET | ORAL | 3 refills | Status: DC
Start: 2019-01-23 — End: 2019-03-31

## 2019-01-24 ENCOUNTER — Ambulatory Visit: Admit: 2019-01-24 | Attending: Internal Medicine | Primary: Internal Medicine

## 2019-01-24 ENCOUNTER — Ambulatory Visit: Attending: Internal Medicine | Primary: Internal Medicine

## 2019-01-24 DIAGNOSIS — E1159 Type 2 diabetes mellitus with other circulatory complications: Secondary | ICD-10-CM

## 2019-01-24 NOTE — Progress Notes (Signed)
Kelsey [name] is a 68 y.o. female and presents with   Chief Complaint   Patient presents with   ??? Pre-op Exam     catarat   .  Here for pre op cataract.  Forgot paperwork  Thinks bs are high due to steroids  Denies chest pain or sob      Current Outpatient Medications   Medication Sig Dispense Refill   ??? losartan (COZAAR) 25 mg tablet TAKE 1 TAB BY MOUTH DAILY. 30 Tab 3   ??? nabumetone (RELAFEN) 500 mg tablet TAKE 1 TABLET BY MOUTH TWICE A DAY 60 Tab 0   ??? carvedilol (COREG) 12.5 mg tablet TAKE 1 TABLET BY MOUTH TWICE A DAY 180 Tab 3   ??? LEVEMIR FLEXTOUCH U-100 INSULN 100 unit/mL (3 mL) inpn INJECT 34 UNITS UNDER THE SKIN NIGHTLY AT BEDTIME 5 Adjustable Dose Pre-filled Pen Syringe 2   ??? CHANTIX CONTINUING MONTH BOX 1 mg tablet TAKE 1 TABLET BY MOUTH TWICE A DAY 56 Tab 3   ??? clopidogrel (PLAVIX) 75 mg tab TAKE 1 TABLET BY MOUTH EVERY DAY 30 Tab 11   ??? solifenacin (VESICARE) 10 mg tablet TAKE 1 TABLET BY MOUTH DAILY. 90 Tab 4   ??? gabapentin (NEURONTIN) 300 mg capsule Take 3 Caps by mouth three (3) times daily. Max Daily Amount: 2,700 mg. 270 Cap 1   ??? pantoprazole (PROTONIX) 40 mg tablet TAKE 1 TABLET BY MOUTH DAILY 90 Tab 0   ??? pravastatin (PRAVACHOL) 40 mg tablet TAKE 1 TAB BY MOUTH NIGHTLY. 90 Tab 3   ??? doxycycline (ADOXA) 100 mg tablet Take 1 Tab by mouth two (2) times a day. 20 Tab 0   ??? lubiPROStone (AMITIZA) 24 mcg capsule Take 24 mcg by mouth two (2) times daily (with meals).     ??? cholecalciferol (VITAMIN D3) 2,000 unit cap capsule TAKE 1 CAPSULE BY MOUTH EVERY DAY 90 Cap 3   ??? aspirin delayed-release 81 mg tablet TAKE 1 TAB BY MOUTH DAILY. 90 Tab 3   ??? DULoxetine (CYMBALTA) 60 mg capsule TAKE 1 CAPSULE BY MOUTH DAILY 90 Cap 2   ??? metFORMIN ER (GLUCOPHAGE XR) 500 mg tablet TAKE 2 TABLETS BY MOUTH IN THE MORNING WITH BREAKFAST AND 3 TABLETS BY MOUTH AT BEDTIME (Patient taking differently: Reports 2 tabs am & pm) 450 Tab 3   ??? ezetimibe (ZETIA) 10 mg tablet TAKE 1 TAB BY MOUTH NIGHTLY. 90 Tab 3   ??? lidocaine  (LIDODERM) 5 % Apply patch to the affected area for 12 hours a day and remove for 12 hours a day. (Patient taking differently: 1 Patch by TransDERmal route daily as needed. Apply patch to the affected area for 12 hours a day and remove for 12 hours a day.) 10 Each 2   ??? magnesium oxide (MAG-OX) 400 mg tablet Take 1 Tab by mouth daily. 90 Tab 3   ??? nitroglycerin (NITROSTAT) 0.4 mg SL tablet 1 Tab by SubLINGual route every five (5) minutes as needed for Chest Pain for up to 3 doses. 1 Bottle 1   ??? bumetanide (BUMEX) 2 mg tablet Take 2 mg by mouth daily.       Allergies   Allergen Reactions   ??? Morphine Anaphylaxis     Tolerates oxycodone without problem   ??? Soap Rash   ??? Avalide [Irbesartan-Hydrochlorothiazide] Myalgia   ??? Betadine [Povidone-Iodine] Rash   ??? Demerol [Meperidine] Other (comments)     Makes her feel crazy   ???  Pcn [Penicillins] Swelling   ??? Prinivil [Lisinopril] Cough   ??? Statins-Hmg-Coa Reductase Inhibitors Myalgia   ??? Sulfa (Sulfonamide Antibiotics) Unknown (comments)   ??? Tricor [Fenofibrate Micronized] Myalgia     Past Medical History:   Diagnosis Date   ??? Acute MI ([place])     x3   ??? 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 Arthritis    ??? CAD (coronary artery disease)    ??? Cervical spondylolysis    ??? Chronic pain    ??? COPD    ??? Diabetes ([place])    ??? GERD (gastroesophageal reflux disease)    ??? Goiter 04/20/2012   ??? Hypertension    ??? Neuropathy 04/06/2013     Past Surgical History:   Procedure Laterality Date   ??? 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 CARDIAC SURG PROCEDURE UNLIST      PTCA   ??? CARDIAC SURG PROCEDURE UNLIST  1999    3 STENTS PLACED   ??? CARDIAC SURG PROCEDURE UNLIST      2 ILIAC STENTS   ??? COLONOSCOPY N/A 07/22/2018    COLONOSCOPY performed by Marlise Eves., MD at [facility] ENDOSCOPY   ??? HX KNEE ARTHROSCOPY Right     X2   ??? HX ORTHOPAEDIC  2018    [place]   ??? HX ORTHOPAEDIC Right     ROTATOR CUFF REPAIR   ??? HX OTHER SURGICAL Right 1970    GANGLION CYST  WRIST   ??? HX TONSILLECTOMY  1963     Family History   Problem Relation Age of Onset   ??? Lung Disease Mother 63   ??? Other Mother         ANEURYSM   ??? Stroke Father    ??? Heart Disease Father    ??? Lung Disease Brother 31        COPD   ??? Hypertension Brother    ??? Cancer Brother         PROSTATE   ??? Heart Disease Brother    ??? Diabetes Brother    ??? Other Brother         SEPSIS   ??? No Known Problems Paternal Grandmother    ??? Anesth Problems Neg Hx      Social History     Tobacco Use   ??? Smoking status: Former Smoker     Packs/day: 0.30     Years: 45.00     Pack years: 13.50     Types: Cigarettes   ??? Smokeless tobacco: Never Used   ??? Tobacco comment: quit around the first of November   Substance Use Topics   ??? Alcohol use: Yes     Frequency: Monthly or less     Drinks per session: 1 or 2     Binge frequency: Never     Comment: may be 3 times a year if that      The patient has a history of falls. A plan of care for falls was documented..  Depression screen positive, patient instructed to schedule follow-up visit at this practice.      Objective:  Visit Vitals  BP 120/60   Ht 5' 4" (1.626 m)   Wt 186 lb (84.4 kg)   BMI 31.93 kg/m??     wdwn 68 yo wf  In NAD. A&O.  HEENT -- Pupils round.  O/P Clear.  Neck --  Supple. No JVD.  Heart -- RRR. 1/6 asm  Lungs -- Coarse breath sounds  Abdomen -- Soft. Non-tender. Non-distended. No masses. Bowel sounds present.  Extremities -- No edema. Poor pulses        Assessment/Plan:    ICD-10-CM ICD-9-CM    1. Hypertension complicating diabetes ([place]) E11.59 250.80     I10 401.9    2. Atherosclerosis of aorta (HCC) I70.0 440.0    3. Uncontrolled type 2 diabetes mellitus with hyperglycemia (HCC) E11.65 250.02    4. 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 Type 2 diabetes mellitus with diabetic neuropathy, with long-term current use of insulin (HCC) E11.40 250.60     Z79.4 357.2      V58.67    6. Severe obesity with body mass index (BMI) of 35.0 to 39.9 with  serious comorbidity (HCC) E66.01 278.01    7. Long term (current) use of insulin ([place]) Z79.4 V58.67        Fax pre op paperwork once we receive it.    Author:  Genene Churn, MD 01/24/2019 8:09 AM

## 2019-01-24 NOTE — Progress Notes (Signed)
Kelsey [name] is a 68 y.o. female and presents with   Chief Complaint   Patient presents with   ??? Pre-op Exam     catarat   .  Here for pre op cataract.  Forgot paperwork  Thinks bs are high due to steroids  Denies chest pain or sob      Current Outpatient Medications   Medication Sig Dispense Refill   ??? losartan (COZAAR) 25 mg tablet TAKE 1 TAB BY MOUTH DAILY. 30 Tab 3   ??? nabumetone (RELAFEN) 500 mg tablet TAKE 1 TABLET BY MOUTH TWICE A DAY 60 Tab 0   ??? carvedilol (COREG) 12.5 mg tablet TAKE 1 TABLET BY MOUTH TWICE A DAY 180 Tab 3   ??? LEVEMIR FLEXTOUCH U-100 INSULN 100 unit/mL (3 mL) inpn INJECT 34 UNITS UNDER THE SKIN NIGHTLY AT BEDTIME 5 Adjustable Dose Pre-filled Pen Syringe 2   ??? CHANTIX CONTINUING MONTH BOX 1 mg tablet TAKE 1 TABLET BY MOUTH TWICE A DAY 56 Tab 3   ??? clopidogrel (PLAVIX) 75 mg tab TAKE 1 TABLET BY MOUTH EVERY DAY 30 Tab 11   ??? solifenacin (VESICARE) 10 mg tablet TAKE 1 TABLET BY MOUTH DAILY. 90 Tab 4   ??? gabapentin (NEURONTIN) 300 mg capsule Take 3 Caps by mouth three (3) times daily. Max Daily Amount: 2,700 mg. 270 Cap 1   ??? pantoprazole (PROTONIX) 40 mg tablet TAKE 1 TABLET BY MOUTH DAILY 90 Tab 0   ??? pravastatin (PRAVACHOL) 40 mg tablet TAKE 1 TAB BY MOUTH NIGHTLY. 90 Tab 3   ??? doxycycline (ADOXA) 100 mg tablet Take 1 Tab by mouth two (2) times a day. 20 Tab 0   ??? lubiPROStone (AMITIZA) 24 mcg capsule Take 24 mcg by mouth two (2) times daily (with meals).     ??? cholecalciferol (VITAMIN D3) 2,000 unit cap capsule TAKE 1 CAPSULE BY MOUTH EVERY DAY 90 Cap 3   ??? aspirin delayed-release 81 mg tablet TAKE 1 TAB BY MOUTH DAILY. 90 Tab 3   ??? DULoxetine (CYMBALTA) 60 mg capsule TAKE 1 CAPSULE BY MOUTH DAILY 90 Cap 2   ??? metFORMIN ER (GLUCOPHAGE XR) 500 mg tablet TAKE 2 TABLETS BY MOUTH IN THE MORNING WITH BREAKFAST AND 3 TABLETS BY MOUTH AT BEDTIME (Patient taking differently: Reports 2 tabs am & pm) 450 Tab 3   ??? ezetimibe (ZETIA) 10 mg tablet TAKE 1 TAB BY MOUTH NIGHTLY. 90 Tab 3    ??? lidocaine (LIDODERM) 5 % Apply patch to the affected area for 12 hours a day and remove for 12 hours a day. (Patient taking differently: 1 Patch by TransDERmal route daily as needed. Apply patch to the affected area for 12 hours a day and remove for 12 hours a day.) 10 Each 2   ??? magnesium oxide (MAG-OX) 400 mg tablet Take 1 Tab by mouth daily. 90 Tab 3   ??? nitroglycerin (NITROSTAT) 0.4 mg SL tablet 1 Tab by SubLINGual route every five (5) minutes as needed for Chest Pain for up to 3 doses. 1 Bottle 1   ??? bumetanide (BUMEX) 2 mg tablet Take 2 mg by mouth daily.       Allergies   Allergen Reactions   ??? Morphine Anaphylaxis     Tolerates oxycodone without problem   ??? Soap Rash   ??? Avalide [Irbesartan-Hydrochlorothiazide] Myalgia   ??? Betadine [Povidone-Iodine] Rash   ??? Demerol [Meperidine] Other (comments)     Makes her feel crazy   ???  Pcn [Penicillins] Swelling   ??? Prinivil [Lisinopril] Cough   ??? Statins-Hmg-Coa Reductase Inhibitors Myalgia   ??? Sulfa (Sulfonamide Antibiotics) Unknown (comments)   ??? Tricor [Fenofibrate Micronized] Myalgia     Past Medical History:   Diagnosis Date   ??? Acute MI ([place])     x3   ??? 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 Arthritis    ??? CAD (coronary artery disease)    ??? Cervical spondylolysis    ??? Chronic pain    ??? COPD    ??? Diabetes ([place])    ??? GERD (gastroesophageal reflux disease)    ??? Goiter 04/20/2012   ??? Hypertension    ??? Neuropathy 04/06/2013     Past Surgical History:   Procedure Laterality Date   ??? 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 CARDIAC SURG PROCEDURE UNLIST      PTCA   ??? CARDIAC SURG PROCEDURE UNLIST  1999    3 STENTS PLACED   ??? CARDIAC SURG PROCEDURE UNLIST      2 ILIAC STENTS   ??? COLONOSCOPY N/A 07/22/2018    COLONOSCOPY performed by [facility]ise Eves., MD at [facility] ENDOSCOPY   ??? HX KNEE ARTHROSCOPY Right     X2   ??? HX ORTHOPAEDIC  2018    [place]   ??? HX ORTHOPAEDIC Right     ROTATOR CUFF REPAIR   ??? HX OTHER SURGICAL Right 1970     GANGLION CYST WRIST   ??? HX TONSILLECTOMY  1963     Family History   Problem Relation Age of Onset   ??? Lung Disease Mother 32   ??? Other Mother         ANEURYSM   ??? Stroke Father    ??? Heart Disease Father    ??? Lung Disease Brother 34        COPD   ??? Hypertension Brother    ??? Cancer Brother         PROSTATE   ??? Heart Disease Brother    ??? Diabetes Brother    ??? Other Brother         SEPSIS   ??? No Known Problems Paternal Grandmother    ??? Anesth Problems Neg Hx      Social History     Tobacco Use   ??? Smoking status: Former Smoker     Packs/day: 0.30     Years: 45.00     Pack years: 13.50     Types: Cigarettes   ??? Smokeless tobacco: Never Used   ??? Tobacco comment: quit around the first of November   Substance Use Topics   ??? Alcohol use: Yes     Frequency: Monthly or less     Drinks per session: 1 or 2     Binge frequency: Never     Comment: may be 3 times a year if that      The patient has a history of falls. A plan of care for falls was documented..  Depression screen positive, patient instructed to schedule follow-up visit at this practice.      Objective:  Visit Vitals  BP 120/60   Ht '5\' 4"'  (1.626 m)   Wt 186 lb (84.4 kg)   BMI 31.93 kg/m??     wdwn 68 yo wf  In NAD. A&O.  HEENT -- Pupils round.  O/P Clear.  Neck --  Supple. No JVD.  Heart -- RRR. 1/6 asm  Lungs -- Coarse breath sounds  Abdomen -- Soft. Non-tender. Non-distended. No masses. Bowel sounds present.  Extremities -- No edema. Poor pulses        Assessment/Plan:    ICD-10-CM ICD-9-CM    1. Hypertension complicating diabetes ([place]) E11.59 250.80     I10 401.9    2. Atherosclerosis of aorta (HCC) I70.0 440.0    3. Uncontrolled type 2 diabetes mellitus with hyperglycemia (HCC) E11.65 250.02    4. 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 Type 2 diabetes mellitus with diabetic neuropathy, with long-term current use of insulin (HCC) E11.40 250.60     Z79.4 357.2      V58.67     6. Severe obesity with body mass index (BMI) of 35.0 to 39.9 with serious comorbidity (HCC) E66.01 278.01    7. Long term (current) use of insulin ([place]) Z79.4 V58.67        Fax pre op paperwork once we receive it.    Author:  Genene Churn, MD 01/24/2019 8:09 AM

## 2019-03-10 MED ORDER — LEVEMIR FLEXTOUCH U-100 INSULIN 100 UNIT/ML (3 ML) SUBCUTANEOUS PEN
100 unit/mL (3 mL) | SUBCUTANEOUS | 1 refills | Status: DC
Start: 2019-03-10 — End: 2019-03-31

## 2019-03-14 NOTE — Telephone Encounter (Signed)
You can call in doxycycline 100 mg twice daily for 7 days

## 2019-03-24 MED ORDER — DULOXETINE 60 MG CAP, DELAYED RELEASE
60 mg | ORAL_CAPSULE | ORAL | 1 refills | Status: DC
Start: 2019-03-24 — End: 2019-03-31

## 2019-03-31 ENCOUNTER — Telehealth: Attending: Internal Medicine | Primary: Internal Medicine

## 2019-03-31 ENCOUNTER — Encounter

## 2019-03-31 DIAGNOSIS — Z0000 Encounter for general adult medical examination without abnormal findings: Secondary | ICD-10-CM

## 2019-03-31 MED ORDER — CARVEDILOL 12.5 MG TAB
12.5 mg | ORAL_TABLET | ORAL | 0 refills | Status: AC
Start: 2019-03-31 — End: ?

## 2019-03-31 MED ORDER — BUMETANIDE 2 MG TAB
2 mg | ORAL_TABLET | Freq: Every day | ORAL | 0 refills | Status: AC
Start: 2019-03-31 — End: ?

## 2019-03-31 MED ORDER — NITROGLYCERIN 0.4 MG SUBLINGUAL TAB
0.4 mg | SUBLINGUAL | 1 refills | Status: AC | PRN
Start: 2019-03-31 — End: ?

## 2019-03-31 MED ORDER — LOSARTAN 25 MG TAB
25 mg | ORAL_TABLET | ORAL | 0 refills | Status: DC
Start: 2019-03-31 — End: 2019-05-31

## 2019-03-31 MED ORDER — CLOPIDOGREL 75 MG TAB
75 mg | ORAL_TABLET | ORAL | 0 refills | Status: AC
Start: 2019-03-31 — End: ?

## 2019-03-31 MED ORDER — SOLIFENACIN 10 MG TAB
10 mg | ORAL_TABLET | ORAL | 0 refills | Status: AC
Start: 2019-03-31 — End: ?

## 2019-03-31 MED ORDER — MAGNESIUM OXIDE 400 MG TAB
400 mg | ORAL_TABLET | Freq: Every day | ORAL | 0 refills | Status: AC
Start: 2019-03-31 — End: ?

## 2019-03-31 MED ORDER — EZETIMIBE 10 MG TAB
10 mg | ORAL_TABLET | ORAL | 0 refills | Status: DC
Start: 2019-03-31 — End: 2019-07-08

## 2019-03-31 MED ORDER — PANTOPRAZOLE 40 MG TAB, DELAYED RELEASE
40 mg | ORAL_TABLET | ORAL | 0 refills | Status: DC
Start: 2019-03-31 — End: 2019-05-22

## 2019-03-31 MED ORDER — DULOXETINE 60 MG CAP, DELAYED RELEASE
60 mg | ORAL_CAPSULE | ORAL | 0 refills | Status: AC
Start: 2019-03-31 — End: ?

## 2019-03-31 MED ORDER — NABUMETONE 500 MG TAB
500 mg | ORAL_TABLET | ORAL | 0 refills | Status: DC
Start: 2019-03-31 — End: 2019-06-07

## 2019-03-31 MED ORDER — ASPIRIN 81 MG TAB, DELAYED RELEASE
81 mg | ORAL_TABLET | ORAL | 0 refills | Status: AC
Start: 2019-03-31 — End: ?

## 2019-03-31 MED ORDER — GABAPENTIN 300 MG CAP
300 mg | ORAL_CAPSULE | Freq: Three times a day (TID) | ORAL | 1 refills | Status: DC
Start: 2019-03-31 — End: 2019-05-09

## 2019-03-31 MED ORDER — LEVEMIR FLEXTOUCH U-100 INSULIN 100 UNIT/ML (3 ML) SUBCUTANEOUS PEN
100 unit/mL (3 mL) | SUBCUTANEOUS | 0 refills | Status: AC
Start: 2019-03-31 — End: ?

## 2019-03-31 MED ORDER — METFORMIN SR 500 MG 24 HR TABLET
500 mg | ORAL_TABLET | ORAL | 0 refills | Status: AC
Start: 2019-03-31 — End: ?

## 2019-03-31 NOTE — Progress Notes (Signed)
This is an Initial Medicare Annual Wellness Exam (AWV) (Performed 12 months after IPPE or effective date of Medicare Part B enrollment, Once in a lifetime)    I have reviewed the patient's medical history in detail and updated the computerized patient record.     History     Patient Active Problem List   Diagnosis Code   ??? Coronary atherosclerosis of native coronary artery I25.10   ??? Other chest pain R07.89   ??? Other dyspnea and respiratory abnormality R06.09, R09.89   ??? Mixed hyperlipidemia E78.2   ??? Type II diabetes mellitus, uncontrolled (HCC) E11.65   ??? Dyspnea R06.00   ??? Goiter E04.9   ??? Neuropathy G62.9   ??? Long term (current) use of insulin (HCC) Z79.4   ??? Hypertension complicating diabetes (HCC) E11.59, I10   ??? CAD (coronary artery disease) I25.10   ??? 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 Hypertensive urgency I16.0   ??? Anemia D64.9   ??? 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 Type 2 diabetes mellitus with diabetic neuropathy (HCC) E11.40   ??? Atherosclerosis of aorta (HCC) I70.0   ??? Severe obesity with body mass index (BMI) of 35.0 to 39.9 with serious comorbidity (HCC) E66.01   ??? 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 Hip pain M25.559   ??? Chronic idiopathic constipation K59.04     Past Medical History:   Diagnosis Date   ??? Acute MI (HCC)     x3   ??? 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 Arthritis    ??? CAD (coronary artery disease)    ??? Cervical spondylolysis    ??? Chronic pain    ??? COPD    ??? Diabetes (HCC)    ??? GERD (gastroesophageal reflux disease)    ??? Goiter 04/20/2012   ??? Hypertension    ??? Neuropathy 04/06/2013      Past Surgical History:   Procedure Laterality Date   ??? 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 CARDIAC SURG PROCEDURE UNLIST      PTCA   ??? CARDIAC SURG PROCEDURE UNLIST  1999    3 STENTS PLACED   ??? CARDIAC SURG PROCEDURE UNLIST      2 ILIAC STENTS   ??? COLONOSCOPY N/A 07/22/2018    COLONOSCOPY performed by Erling Conte., MD at [facility]  ENDOSCOPY   ??? HX KNEE ARTHROSCOPY Right     X2   ??? HX ORTHOPAEDIC  2018    LTH   ??? HX ORTHOPAEDIC Right     ROTATOR CUFF REPAIR   ??? HX OTHER SURGICAL Right 1970    GANGLION CYST WRIST   ??? HX TONSILLECTOMY  1963     Current Outpatient Medications   Medication Sig Dispense Refill   ??? DULoxetine (CYMBALTA) 60 mg capsule TAKE 1 CAPSULE BY MOUTH DAILY 90 Cap 1   ??? Levemir FlexTouch U-100 Insuln 100 unit/mL (3 mL) inpn INJECT 34 UNITS UNDER THE SKIN NIGHTLY AT BEDTIME 12 Adjustable Dose Pre-filled Pen Syringe 1   ??? losartan (COZAAR) 25 mg tablet TAKE 1 TAB BY MOUTH DAILY. 30 Tab 3   ??? nabumetone (RELAFEN) 500 mg tablet TAKE 1 TABLET BY MOUTH TWICE A DAY 60 Tab 0   ??? carvedilol (COREG) 12.5 mg tablet TAKE 1 TABLET BY MOUTH TWICE A DAY 180 Tab 3   ??? CHANTIX CONTINUING MONTH  BOX 1 mg tablet TAKE 1 TABLET BY MOUTH TWICE A DAY 56 Tab 3   ??? clopidogrel (PLAVIX) 75 mg tab TAKE 1 TABLET BY MOUTH EVERY DAY 30 Tab 11   ??? solifenacin (VESICARE) 10 mg tablet TAKE 1 TABLET BY MOUTH DAILY. 90 Tab 4   ??? gabapentin (NEURONTIN) 300 mg capsule Take 3 Caps by mouth three (3) times daily. Max Daily Amount: 2,700 mg. 270 Cap 1   ??? pantoprazole (PROTONIX) 40 mg tablet TAKE 1 TABLET BY MOUTH DAILY 90 Tab 0   ??? pravastatin (PRAVACHOL) 40 mg tablet TAKE 1 TAB BY MOUTH NIGHTLY. 90 Tab 3   ??? doxycycline (ADOXA) 100 mg tablet Take 1 Tab by mouth two (2) times a day. 20 Tab 0   ??? lubiPROStone (AMITIZA) 24 mcg capsule Take 24 mcg by mouth two (2) times daily (with meals).     ??? cholecalciferol (VITAMIN D3) 2,000 unit cap capsule TAKE 1 CAPSULE BY MOUTH EVERY DAY 90 Cap 3   ??? aspirin delayed-release 81 mg tablet TAKE 1 TAB BY MOUTH DAILY. 90 Tab 3   ??? metFORMIN ER (GLUCOPHAGE XR) 500 mg tablet TAKE 2 TABLETS BY MOUTH IN THE MORNING WITH BREAKFAST AND 3 TABLETS BY MOUTH AT BEDTIME (Patient taking differently: Reports 2 tabs am & pm) 450 Tab 3   ??? ezetimibe (ZETIA) 10 mg tablet TAKE 1 TAB BY MOUTH NIGHTLY. 90 Tab 3   ??? lidocaine (LIDODERM) 5 % Apply  patch to the affected area for 12 hours a day and remove for 12 hours a day. (Patient taking differently: 1 Patch by TransDERmal route daily as needed. Apply patch to the affected area for 12 hours a day and remove for 12 hours a day.) 10 Each 2   ??? magnesium oxide (MAG-OX) 400 mg tablet Take 1 Tab by mouth daily. 90 Tab 3   ??? nitroglycerin (NITROSTAT) 0.4 mg SL tablet 1 Tab by SubLINGual route every five (5) minutes as needed for Chest Pain for up to 3 doses. 1 Bottle 1   ??? bumetanide (BUMEX) 2 mg tablet Take 2 mg by mouth daily.       Allergies   Allergen Reactions   ??? Morphine Anaphylaxis     Tolerates oxycodone without problem   ??? Soap Rash   ??? Avalide [Irbesartan-Hydrochlorothiazide] Myalgia   ??? Betadine [Povidone-Iodine] Rash   ??? Demerol [Meperidine] Other (comments)     Makes her feel crazy   ??? Pcn [Penicillins] Swelling   ??? Prinivil [Lisinopril] Cough   ??? Statins-Hmg-Coa Reductase Inhibitors Myalgia   ??? Sulfa (Sulfonamide Antibiotics) Unknown (comments)   ??? Tricor [Fenofibrate Micronized] Myalgia       Family History   Problem Relation Age of Onset   ??? Lung Disease Mother 81   ??? Other Mother         ANEURYSM   ??? Stroke Father    ??? Heart Disease Father    ??? Lung Disease Brother 42        COPD   ??? Hypertension Brother    ??? Cancer Brother         PROSTATE   ??? Heart Disease Brother    ??? Diabetes Brother    ??? Other Brother         SEPSIS   ??? No Known Problems Paternal Grandmother    ??? Anesth Problems Neg Hx      Social History     Tobacco Use   ??? Smoking status:  Former Smoker     Packs/day: 0.30     Years: 45.00     Pack years: 13.50     Types: Cigarettes   ??? Smokeless tobacco: Never Used   ??? Tobacco comment: quit around the first of November   Substance Use Topics   ??? Alcohol use: Yes     Frequency: Monthly or less     Drinks per session: 1 or 2     Binge frequency: Never     Comment: may be 3 times a year if that       Depression Risk Factor Screening:     3 most recent PHQ Screens 01/24/2019   PHQ Not Done  Active Diagnosis of Depression or Bipolar Disorder   Little interest or pleasure in doing things -   Feeling down, depressed, irritable, or hopeless -   Total Score PHQ 2 -       Alcohol Risk Factor Screening:   Do you average 1 drink per night or more than 7 drinks a week:  No    On any one occasion in the past three months have you have had more than 3 drinks containing alcohol:  No      Functional Ability and Level of Safety:   Hearing: The patient needs further evaluation.    Activities of Daily Living:  The home contains: will be installing  Patient does total self care     Ambulation: with mild difficulty    Fall Risk:  Fall Risk Assessment, last 12 mths 01/24/2019   Able to walk? Yes   Fall in past 12 months? Yes   Fall with injury? No   Number of falls in past 12 months 1   Fall Risk Score 1       Abuse Screen:  Patient is not abused    Cognitive Screening   Has your family/caregiver stated any concerns about your memory: no  Cognitive Screening: Normal - Mini Cog Test    Patient Care Team   Patient Care Team:  Lake BellsPahle, Keshawn Sundberg J, MD as PCP - General (Internal Medicine)  Marcellus ScottBrowning, Christine M, MD (Cardiology)  Diefenderfer, Mitzi DavenportKatherine G, NP (Nurse Practitioner)    Assessment/Plan   Education and counseling provided:  Are appropriate based on today's review and evaluation  End-of-Life planning (with patient's consent)    Diagnoses and all orders for this visit:    1. Hypertension complicating diabetes (HCC)    2. Atherosclerosis of aorta (HCC)    3. Type 2 diabetes mellitus with nephropathy [facility](HCC)         Health Maintenance Due   Topic Date Due   ??? Shingrix Vaccine Age 69> (1 of 2) 12/09/2000   ??? Pneumococcal 65+ years (2 of 2 - PPSV23) 03/04/2017   ??? GLAUCOMA SCREENING Q2Y  07/30/2017   ??? Medicare Yearly Exam  02/03/2019   ??? Breast Cancer Screen Mammogram  02/03/2019   ??? Foot Exam Q1  04/22/2019   ??? MICROALBUMIN Q1  04/22/2019     ACP- patient has a medical directive. Does not wish for any life prolonging measures  if terminal " let me die"  [address]

## 2019-03-31 NOTE — Progress Notes (Signed)
This is an Initial Medicare Annual Wellness Exam (AWV) (Performed 12 months after IPPE or effective date of Medicare Part B enrollment, Once in a lifetime)    I have reviewed the patient's medical history in detail and updated the computerized patient record.     History     Patient Active Problem List   Diagnosis Code   ??? Coronary atherosclerosis of native coronary artery I25.10   ??? Other chest pain R07.89   ??? Other dyspnea and respiratory abnormality R06.09, R09.89   ??? Mixed hyperlipidemia E78.2   ??? Type II diabetes mellitus, uncontrolled (HCC) E11.65   ??? Dyspnea R06.00   ??? Goiter E04.9   ??? Neuropathy G62.9   ??? Long term (current) use of insulin (HCC) Z79.4   ??? Hypertension complicating diabetes (HCC) E11.59, I10   ??? CAD (coronary artery disease) I25.10   ??? 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 Hypertensive urgency I16.0   ??? Anemia D64.9   ??? 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 Type 2 diabetes mellitus with diabetic neuropathy (HCC) E11.40   ??? Atherosclerosis of aorta (HCC) I70.0   ??? Severe obesity with body mass index (BMI) of 35.0 to 39.9 with serious comorbidity (HCC) E66.01   ??? 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 Hip pain M25.559   ??? Chronic idiopathic constipation K59.04     Past Medical History:   Diagnosis Date   ??? Acute MI (HCC)     x3   ??? 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 Arthritis    ??? CAD (coronary artery disease)    ??? Cervical spondylolysis    ??? Chronic pain    ??? COPD    ??? Diabetes (HCC)    ??? GERD (gastroesophageal reflux disease)    ??? Goiter 04/20/2012   ??? Hypertension    ??? Neuropathy 04/06/2013      Past Surgical History:   Procedure Laterality Date   ??? 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 CARDIAC SURG PROCEDURE UNLIST      PTCA   ??? CARDIAC SURG PROCEDURE UNLIST  1999    3 STENTS PLACED   ??? CARDIAC SURG PROCEDURE UNLIST      2 ILIAC STENTS   ??? COLONOSCOPY N/A 07/22/2018     [facility]alreja[facility]   ??? HX KNEE ARTHROSCOPY Right     X2   ??? HX ORTHOPAEDIC  2018    LTH   ??? HX ORTHOPAEDIC Right     ROTATOR CUFF REPAIR   ??? HX OTHER SURGICAL Right 1970    GANGLION CYST WRIST   ??? HX TONSILLECTOMY  1963     Current Outpatient Medications   Medication Sig Dispense Refill   ??? DULoxetine (CYMBALTA) 60 mg capsule TAKE 1 CAPSULE BY MOUTH DAILY 90 Cap 1   ??? Levemir FlexTouch U-100 Insuln 100 unit/mL (3 mL) inpn INJECT 34 UNITS UNDER THE SKIN NIGHTLY AT BEDTIME 12 Adjustable Dose Pre-filled Pen Syringe 1   ??? losartan (COZAAR) 25 mg tablet TAKE 1 TAB BY MOUTH DAILY. 30 Tab 3   ??? nabumetone (RELAFEN) 500 mg tablet TAKE 1 TABLET BY MOUTH TWICE A DAY 60 Tab 0   ??? carvedilol (COREG) 12.5 mg tablet TAKE 1 TABLET BY MOUTH TWICE A DAY 180 Tab 3   ??? CHANTIX CONTINUING MONTH  BOX 1 mg tablet TAKE 1 TABLET BY MOUTH TWICE A DAY 56 Tab 3   ??? clopidogrel (PLAVIX) 75 mg tab TAKE 1 TABLET BY MOUTH EVERY DAY 30 Tab 11   ??? solifenacin (VESICARE) 10 mg tablet TAKE 1 TABLET BY MOUTH DAILY. 90 Tab 4   ??? gabapentin (NEURONTIN) 300 mg capsule Take 3 Caps by mouth three (3) times daily. Max Daily Amount: 2,700 mg. 270 Cap 1   ??? pantoprazole (PROTONIX) 40 mg tablet TAKE 1 TABLET BY MOUTH DAILY 90 Tab 0   ??? pravastatin (PRAVACHOL) 40 mg tablet TAKE 1 TAB BY MOUTH NIGHTLY. 90 Tab 3   ??? doxycycline (ADOXA) 100 mg tablet Take 1 Tab by mouth two (2) times a day. 20 Tab 0   ??? lubiPROStone (AMITIZA) 24 mcg capsule Take 24 mcg by mouth two (2) times daily (with meals).     ??? cholecalciferol (VITAMIN D3) 2,000 unit cap capsule TAKE 1 CAPSULE BY MOUTH EVERY DAY 90 Cap 3   ??? aspirin delayed-release 81 mg tablet TAKE 1 TAB BY MOUTH DAILY. 90 Tab 3   ??? metFORMIN ER (GLUCOPHAGE XR) 500 mg tablet TAKE 2 TABLETS BY MOUTH IN THE MORNING WITH BREAKFAST AND 3 TABLETS BY MOUTH AT BEDTIME (Patient taking differently: Reports 2 tabs am & pm) 450 Tab 3    ??? ezetimibe (ZETIA) 10 mg tablet TAKE 1 TAB BY MOUTH NIGHTLY. 90 Tab 3   ??? lidocaine (LIDODERM) 5 % Apply patch to the affected area for 12 hours a day and remove for 12 hours a day. (Patient taking differently: 1 Patch by TransDERmal route daily as needed. Apply patch to the affected area for 12 hours a day and remove for 12 hours a day.) 10 Each 2   ??? magnesium oxide (MAG-OX) 400 mg tablet Take 1 Tab by mouth daily. 90 Tab 3   ??? nitroglycerin (NITROSTAT) 0.4 mg SL tablet 1 Tab by SubLINGual route every five (5) minutes as needed for Chest Pain for up to 3 doses. 1 Bottle 1   ??? bumetanide (BUMEX) 2 mg tablet Take 2 mg by mouth daily.       Allergies   Allergen Reactions   ??? Morphine Anaphylaxis     Tolerates oxycodone without problem   ??? Soap Rash   ??? Avalide [Irbesartan-Hydrochlorothiazide] Myalgia   ??? Betadine [Povidone-Iodine] Rash   ??? Demerol [Meperidine] Other (comments)     Makes her feel crazy   ??? Pcn [Penicillins] Swelling   ??? Prinivil [Lisinopril] Cough   ??? Statins-Hmg-Coa Reductase Inhibitors Myalgia   ??? Sulfa (Sulfonamide Antibiotics) Unknown (comments)   ??? Tricor [Fenofibrate Micronized] Myalgia       Family History   Problem Relation Age of Onset   ??? Lung Disease Mother 28   ??? Other Mother         ANEURYSM   ??? Stroke Father    ??? Heart Disease Father    ??? Lung Disease Brother 68        COPD   ??? Hypertension Brother    ??? Cancer Brother         PROSTATE   ??? Heart Disease Brother    ??? Diabetes Brother    ??? Other Brother         SEPSIS   ??? No Known Problems Paternal Grandmother    ??? Anesth Problems Neg Hx      Social History     Tobacco Use   ??? Smoking status:  Former Smoker     Packs/day: 0.30     Years: 45.00     Pack years: 13.50     Types: Cigarettes   ??? Smokeless tobacco: Never Used   ??? Tobacco comment: quit around the first of November   Substance Use Topics   ??? Alcohol use: Yes     Frequency: Monthly or less     Drinks per session: 1 or 2     Binge frequency: Never      Comment: may be 3 times a year if that       Depression Risk Factor Screening:     3 most recent PHQ Screens 01/24/2019   PHQ Not Done Active Diagnosis of Depression or Bipolar Disorder   Little interest or pleasure in doing things -   Feeling down, depressed, irritable, or hopeless -   Total Score PHQ 2 -       Alcohol Risk Factor Screening:   Do you average 1 drink per night or more than 7 drinks a week:  No    On any one occasion in the past three months have you have had more than 3 drinks containing alcohol:  No      Functional Ability and Level of Safety:   Hearing: The patient needs further evaluation.    Activities of Daily Living:  The home contains: will be installing  Patient does total self care     Ambulation: with mild difficulty    Fall Risk:  Fall Risk Assessment, last 12 mths 01/24/2019   Able to walk? Yes   Fall in past 12 months? Yes   Fall with injury? No   Number of falls in past 12 months 1   Fall Risk Score 1       Abuse Screen:  Patient is not abused    Cognitive Screening   Has your family/caregiver stated any concerns about your memory: no  Cognitive Screening: Normal - Mini Cog Test    Patient Care Team   Patient Care Team:  Lake Bells, MD as PCP - General (Internal Medicine)  Marcellus Scott, MD (Cardiology)  Diefenderfer, Mitzi Davenport, NP (Nurse Practitioner)    Assessment/Plan   Education and counseling provided:  Are appropriate based on today's review and evaluation  End-of-Life planning (with patient's consent)    Diagnoses and all orders for this visit:    1. Hypertension complicating diabetes (HCC)    2. Atherosclerosis of aorta (HCC)    3. Type 2 diabetes mellitus with nephropathy [facility])         Health Maintenance Due   Topic Date Due   ??? Shingrix Vaccine Age 106> (1 of 2) 12/09/2000   ??? Pneumococcal 65+ years (2 of 2 - PPSV23) 03/04/2017   ??? GLAUCOMA SCREENING Q2Y  07/30/2017   ??? Medicare Yearly Exam  02/03/2019   ??? Breast Cancer Screen Mammogram  02/03/2019    ??? Foot Exam Q1  04/22/2019   ??? MICROALBUMIN Q1  04/22/2019     ACP- patient has a medical directive. Does not wish for any life prolonging measures if terminal " let me die"  Medical POA is [address]

## 2019-04-05 ENCOUNTER — Encounter: Attending: Internal Medicine | Primary: Internal Medicine

## 2019-04-08 ENCOUNTER — Telehealth: Admit: 2019-04-08 | Payer: MEDICARE | Attending: Internal Medicine | Primary: Internal Medicine

## 2019-04-08 ENCOUNTER — Telehealth: Attending: Internal Medicine | Primary: Internal Medicine

## 2019-04-08 DIAGNOSIS — E1159 Type 2 diabetes mellitus with other circulatory complications: Secondary | ICD-10-CM

## 2019-04-08 NOTE — Progress Notes (Signed)
Pt states she get SOB no matter what she is doing, needs to stop and catch her breath, swelling in ankles and feet left side is worse than right.  x2-3 months

## 2019-04-08 NOTE — Progress Notes (Signed)
Progress Notes by Telford Nab, MD at 04/08/19 1400                Author: Telford Nab, MD  Service: --  Author Type: Physician       Filed: 04/08/19 1557  Encounter Date: 04/08/2019  Status: Signed          Editor: Telford Nab, MD (Physician)               [facility]   [phone]      HPI: Kelsey [name], a  68 y.o. year-old who presents for follow up regarding her CAD.      Having worsening dyspnea and swelling L>R   She has always been out of breath but she feels like she is just short of breath all the time now. Takes her less than a minute to catch her breath but she is really out of breath and unjable to breathe, it is severe. No chest pain. NO recent change,  stable form a month ago but worse than 6 months ago. She thinks she has picked up some fluid weight. Thinks that she has lost weight. Without trying. Maybe 35 pounds. Not eating as much but eating ok, just not as hungry.    Last colonoscopy last year, few years since mammo.        She is weaker than she used to be. She does have a 100% RCA which may contribute but it is difficult to know how much is coronary.    Discussed her extensive plaque in the arteries of the body. .     Stable to proceed with upcoming surgery on 12/10. No progressive cad sx to warrant pci of cto RCA at this time. But potential future consideration. Needs elbow surgery more urgently.       HX claudication, ongoing bilateral calves,was seeing Dr. [name] now, angiogram by someone else resulted in an embolus to the right little toe.    Last visit worse dizziness and dyspnea. Dyspnea has stabilized,, dizziness better on lower coreg. Also has vertebral artery stenosis by MRA.    Not checking her glucose but a1C looking good.       Mild edema. Less active since her hip replacement earlier this year.    Not using CPAP, due to reassess with home sleep study.   Discussed the importance of treatment of her OSA    Dr. Fanny Skates  handles pain management       She has complex vascular disease and multiple issues we reviewed today.    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 Dr. [name]   Progressive DOE, concerning for CAD progression, with complex CAD and CTO, last cath 8/17 no targets for intervention, sx have stabilized now   Progressive dizziness somewhat better after cutting coreg but still there.  Has had progression of carotid plaque, will as Vascular to review. CTA neck fu per Dr. [name]         Historic   She was hospitalized at [facility] on 10/15/16 when she presented to ER with elevated BP, jaw/throat/facial tightness and dyspnea   She had just been started losartan on 10/14/16 for elevated BP   Her troponin bumped to 0.71 and she underwent another cardiac cath which did not show any new CAD, no PCI performed   She was discharged but then came back  to ER with right groin hematoma and acute anemia with Hgb 6.9, hypotension and dyspnea   She received 2 units of PRBCs, groin [place] was negative, right leg venous duplex was negative and she was discharged on 10/21/16 and her Hgb was 9.2 at the time   Her angina equivalent was jaw pain in the past   Has balance issues, on gabapentin for neuropathy, walks with a cane   Has had short term memory issues since her CABG/AVR   Difficulty with word finding, cognitive abilities are more limited   Has a hard time remembering people's names   Advised to follow back up with neurology previously       Good friend of [name] who passed away, former patient here   Her wife is here with her today      She has moved to [facility].    So at this time she needs to get established with a PCP there.    Recommend she consider Pulmonary and cardiology consultation there. And may need HiRes CT of the chest to evaluate the findings further. Needs to get her Mammo with the weight loss.    May need echo and  nuclear stress to look for CAD progression.    She will establish care in [facility] as does not plan to return to [place] at this time.        Assessment/Plan:   1.  CAD - hx of MI and PCI x 3, had 3 vessel CABG 06/08/2012 by Dr. Myles Gip, s/p PCI/DES to diagonal 08/29/16, no progression of CAD on cardiac cath 10/17   -continue ASA, Plavix, Coreg and statin   -has Rx for NTG SL tabs PRN chest pain   2.  Severe AS - s/p bioprosthetic bovine AVR 06/08/2012, stable by TTE 10/18    3.  Carotid stenosis - bilateral, progressive, on ASA and statin, get CTA   4.  PAD s/p stent in distal right SFA - concern for progression, more claudication, needs vascular eval and CTA with runoff   5.  Dyslipidemia - at goal in 4/18, on zetia and pravastatin 59m daily, had myalgias on fenofibrate   -NMR at goal   6.  DM Type 2 - Hgb A1c 6.o0, on insulin doing well   7.  HTN - hypotensive, last visit, cut down on coreg 12.557mBID and now bp is better but still has dyspnea, fatigue, claudication and dizziness   8.  Chronic pain/arthritis - s/p hip surgery Dr. Kump/orthopedics   9.  Vitamin D deficiency - on Vitamin D 1000 units daily    10.  RBBB - chronic   11.  Hyponatremia - chronic, stable   12.  Anemia - stable   13.  Enlarged aorta on exam -+ atherosclerosis per Dr. LeElon Spanner 14.  Dyspnea/edema - repeat cath for sx progression      Echo 10/18 gr1dd, EF57% mild AS AVR, mild mr, mac   Aortic duplex 2/18 - aorta normal in size, no aneurysm   ABI 2/18 - right 0.6, left 0.7     Cardiac Cath 10/17 -LCA:  LAD: 30% proximal after large D1, with competitive inflow from LIMA to mid LAD, D1 recently stented widely patent, LCX: 80% after small MOM, before large PLOM which has competitive SVG inflow, RCA: occluded mid, LIMA --> LAD  widely patent unobstructed anasomosis, SVG --> PLOM widely patent, SVG --> LVBr of distal RCA widely patent, moderate disease proximal to SVG insertion, yielding ischemic potential  in small PDA (similar to cath 6 weeks ago, not  fixable and VERY unlikely  to be problematic).   IMP: CAD post PCI, CABG with very limited ischemic potential   Cardiac Cath 9/17 - Patent LIMA to LAD, SVG's to RCA and LCX.  Very large diagonal has eccentric 90% stenosis, treated with 2.75 x 12 Xience DES good result.  Bioprosthetic AV not crossed given recent echo.   Echo 8/17 - LVEF 55-60%, no WMA, wall thickness was increased, dilated LA, atrial septum thickened, mildly dilated RA, mild MR  Carotid Duplex 8/17 -  50-79% R ICA stenosis, 10-49% stenosis of  the L ICA, >50% stenosis of the left ECA   ABI 12/16 - right 0.82 and left 0.88   Echo 6/16 - LVEF 55%, no WMA, bovine AVR with normal function, no AI, mild AS, no pericardial effusion, grd 1 dd, MAC with calcified chords noted with mild sclerosis, mild MR, trace TR   Carotid duplex 6/16 - right 50-69% prox ICA stenosis with moderate hemodynamic compromise, left 1-49% prox ICA stenosis with mild hemodynamic compromise, no change compared to 2014   ABI 12/15 - right 0.70 and left 0.88   Peripheral angiogram 5/15 - stent in distal right SFA, stent in left common iliac artery   ABI 5/15 - right 0.68 and left 0.88   Abd CTA 5/15 - significant PVD, 80% proximal stenosis in celiac plexus, abdominal aorta with diffuse calcification and mural thrombus, mild disease in right and left renal arteries, moderate right iliac disease with CFA stenosis, SFA with moderate disease,  left common iliac with ulcerated plaque proximally, moderate to severe disease in iliac system, SFA with moderate to severe diffuse disease   Echo 09/2012 - LVEF 45-50%, AVR ok, mild MR, mild TR   06/08/12 -CABG x 3 (LIMA to LAD, SVG to OM1, SVG to PLB) and 21 mm [facility] Ease pericardial prosthesis at [facility]   Cardiac Cath 06/04/2012 - mod AS (63m gradient), mod AI, diffuse 3 vessel CAD   Cardiac Cath 05/2011 - LVEF 30%, occluded mid RCA, high grade stenosis distal LCx, mild AS s/p PCI/DES to LCx by Dr. CCurrie Paris  Cardiac Cath 02/2009 - patent stents  in RCA and LCx   PCI to LCx 2001   MI/PCI to RCA 2000      Soc Hx: smokes 1/4 ppd, drinks 2 glasses of wine/month, no drug use   Fam Hx: father had CVA at age 69 had a couple of MIs in his 559sand had arteries replaced with teflon, mother passed at age 167from aortic aneurysm, brother passed age 8567from sepsis and had bad PVD with multiple bypass surgeries      She  has a past medical history of Acute MI ([place], Adverse effect  of anesthesia, Arthritis, CAD (coronary artery disease), Cervical spondylolysis, Chronic pain, COPD, Diabetes ([place], GERD (gastroesophageal reflux disease), Goiter (04/20/2012), Hypertension, and Neuropathy (04/06/2013).      Cardiovascular ROS: no chest pain, positive for dyspnea, edema, dizziness and fatigue    Respiratory ROS: no cough or wheezing   Neurological ROS: no TIA or stroke symptoms   All other systems negative except as above.       PE     Vitals:          04/08/19 1104        Weight:  175 lb (79.4 kg)        Height:  _0  (1.626 m)  Body mass index is 30.04 kg/m??.    General appearance - alert, well appearing, and in no distress   Mental status - affect appropriate to mood   Eyes - sclera anicteric, moist mucous membranes   MSK moving all extrem   Lymphatics - not assessed   Extremities - , 1+ LE edema (left > right)     Skin - normal coloration  no rashes      12 lead ECG: NSR with RBBB, first degree AV block, non-specific ST-T wave changes       Recent Labs:     Lab Results         Component  Value  Date/Time            Cholesterol, total  90  10/16/2016 03:36 AM       Cholesterol, Total  122  09/23/2017 12:13 PM       HDL Cholesterol  46  10/16/2016 03:36 AM       LDL, calculated  18.2  10/16/2016 03:36 AM       Triglyceride  129  10/16/2016 03:36 AM            CHOL/HDL Ratio  2.0  10/16/2016 03:36 AM          Lab Results         Component  Value  Date/Time            Creatinine  1.01 (H)  12/01/2018 10:59 AM          Lab Results         Component  Value  Date/Time             BUN  28 (H)  12/01/2018 10:59 AM          Lab Results         Component  Value  Date/Time            Potassium  4.5  12/01/2018 10:59 AM          Lab Results         Component  Value  Date/Time            Hemoglobin A1c  6.4 (H)  10/04/2018 10:43 AM          Lab Results         Component  Value  Date/Time            HGB  11.3 (L)  10/04/2018 10:43 AM          Lab Results         Component  Value  Date/Time            PLATELET  387  10/04/2018 10:43 AM           Reviewed:     Past Medical History:        Diagnosis  Date         ?  Acute MI ([place])            x3         ?  Adverse effect of anesthesia            pt states she lost memory after cabg in j2013         ?  Arthritis       ?  CAD (coronary artery disease)       ?  Cervical spondylolysis       ?  Chronic pain       ?  COPD       ?  Diabetes ([place])       ?  GERD (gastroesophageal reflux disease)       ?  Goiter  04/20/2012     ?  Hypertension           ?  Neuropathy  04/06/2013          Social History          Tobacco Use        Smoking Status  Former Smoker         ?  Packs/day:  0.30     ?  Years:  45.00     ?  Pack years:  13.50     ?  Types:  Cigarettes        Smokeless Tobacco  Never Used       Tobacco Comment          quit around the first of November          Social History          Substance and Sexual Activity        Alcohol Use  Yes         ?  Frequency:  Monthly or less     ?  Drinks per session:  1 or 2     ?  Binge frequency:  Never          Comment: may be 3 times a year if that          Allergies        Allergen  Reactions         ?  Morphine  Anaphylaxis             Tolerates oxycodone without problem         ?  Soap  Rash     ?  Avalide [Irbesartan-Hydrochlorothiazide]  Myalgia     ?  Betadine [Povidone-Iodine]  Rash     ?  Demerol [Meperidine]  Other (comments)             Makes her feel crazy         ?  Pcn [Penicillins]  Swelling     ?  Prinivil [Lisinopril]  Cough     ?  Statins-Hmg-Coa Reductase Inhibitors  Myalgia     ?  Sulfa  (Sulfonamide Antibiotics)  Unknown (comments)         ?  Tricor [Fenofibrate Micronized]  Myalgia             Current Outpatient Medications        Medication  Sig         ?  carvediloL (COREG) 12.5 mg tablet  TAKE 1 TABLET BY MOUTH TWICE A DAY     ?  gabapentin (NEURONTIN) 300 mg capsule  Take 3 Caps by mouth three (3) times daily. Max Daily Amount: 2,700 mg.     ?  metFORMIN ER (GLUCOPHAGE XR) 500 mg tablet  Reports 2 tabs am & pm     ?  losartan (COZAAR) 25 mg tablet  TAKE 1 TAB BY MOUTH DAILY.     ?  insulin detemir U-100 (Levemir FlexTouch U-100 Insuln) 100 unit/mL (3 mL) inpn  INJECT 34 UNITS UNDER THE SKIN NIGHTLY AT BEDTIME     ?  ezetimibe (ZETIA) 10 mg tablet  TAKE 1 TAB BY MOUTH NIGHTLY.     ?  bumetanide (BUMEX) 2 mg tablet  Take 1 Tab by mouth daily.     ?  magnesium oxide (MAG-OX) 400 mg tablet  Take 1 Tab by mouth daily.     ?  nabumetone (RELAFEN) 500 mg tablet  One daily prn pain     ?  clopidogreL (PLAVIX) 75 mg tab  TAKE 1 TABLET BY MOUTH EVERY DAY     ?  aspirin delayed-release 81 mg tablet  TAKE 1 TAB BY MOUTH DAILY.     ?  pantoprazole (PROTONIX) 40 mg tablet  TAKE 1 TABLET BY MOUTH DAILY     ?  DULoxetine (CYMBALTA) 60 mg capsule  TAKE 1 CAPSULE BY MOUTH DAILY     ?  solifenacin (VESICARE) 10 mg tablet  TAKE 1 TABLET BY MOUTH DAILY.     ?  nitroglycerin (NITROSTAT) 0.4 mg SL tablet  1 Tab by SubLINGual route every five (5) minutes as needed for Chest Pain for up to 3 doses.     ?  CHANTIX CONTINUING MONTH BOX 1 mg tablet  TAKE 1 TABLET BY MOUTH TWICE A DAY     ?  pravastatin (PRAVACHOL) 40 mg tablet  TAKE 1 TAB BY MOUTH NIGHTLY.     ?  cholecalciferol (VITAMIN D3) 2,000 unit cap capsule  TAKE 1 CAPSULE BY MOUTH EVERY DAY         ?  lidocaine (LIDODERM) 5 %  Apply patch to the affected area for 12 hours a day and remove for 12 hours a day. (Patient taking differently: 1 Patch by TransDERmal route daily  as needed. Apply patch to the affected area for 12 hours a day and remove for 12 hours a  day.)         ?  doxycycline (ADOXA) 100 mg tablet  Take 1 Tab by mouth two (2) times a day.         ?  lubiPROStone (AMITIZA) 24 mcg capsule  Take 24 mcg by mouth two (2) times daily (with meals).          No current facility-administered medications for this visit.            Telford Nab, MD   [facility] [place], [place] [address]   [place], [employer]   [phone]                   VIRTUAL VISIT DOCUMENTATION        Pursuant to the emergency declaration under the [place], 1135 waiver authority and the [employer] Act, this Virtual  Visit was conducted, with patient's consent,  to reduce the patient's risk of exposure to COVID-19 and provide continuity of care for an established patient.       Services were provided through a video synchronous discussion virtually to substitute for in-person clinic visit.        CHIEF COMPLAINT         Kelsey [name] is a 68 y.o.  female who was seen by synchronous (real-time) audio-video technology on 04/08/2019.  Patient  is being seen today for          ASSESSMENT                  PLAN           We discussed the expected course, resolution and complications of the diagnosis(es) in detail.  Medication risks, benefits, costs, interactions,  and alternatives were discussed as indicated.  I  advised her to contact the office if her condition worsens, changes or fails to improve as anticipated. She expressed understanding with the diagnosis(es) and plan        HISTORY OF PRESENTING ILLNESS         Kelsey [name] is a 68 y.o.  female             ACTIVE PROBLEM LIST          Patient Active Problem List           Diagnosis  Date Noted         ?  Chronic idiopathic constipation  05/26/2018     ?  Hip pain  02/22/2018     ?  Aortic mural thrombus ([place])  02/15/2018     ?  Severe obesity with body mass index (BMI) of 35.0 to 39.9 with serious comorbidity  ([place])  06/30/2017     ?  Type 2 diabetes mellitus with nephropathy ([place])  01/08/2017     ?  Type 2 diabetes mellitus with diabetic neuropathy ([place])  01/08/2017     ?  Atherosclerosis of aorta ([place])  01/08/2017     ?  Anemia  10/20/2016     ?  CAD (coronary artery disease)  10/16/2016     ?  Jaw claudication  10/16/2016     ?  Hypertensive urgency  10/16/2016     ?  Long term (current) use of insulin ([place])  03/04/2016     ?  Hypertension complicating diabetes ([place])  03/04/2016     ?  Neuropathy  04/06/2013     ?  Goiter  04/20/2012     ?  Dyspnea  03/13/2011     ?  Coronary atherosclerosis of native coronary artery  12/24/2009     ?  Other chest pain  12/24/2009     ?  Other dyspnea and respiratory abnormality  12/24/2009     ?  Mixed hyperlipidemia  12/24/2009         ?  Type II diabetes mellitus, uncontrolled ([place])  12/24/2009                  PAST MEDICAL HISTORY          Past Medical History:        Diagnosis  Date         ?  Acute MI ([place])            x3         ?  Adverse effect of anesthesia            pt states she lost memory after cabg in j2013         ?  Arthritis       ?  CAD (coronary artery disease)       ?  Cervical spondylolysis       ?  Chronic pain       ?  COPD       ?  Diabetes ([place])       ?  GERD (gastroesophageal reflux disease)       ?  Goiter  04/20/2012     ?  Hypertension           ?  Neuropathy  04/06/2013                  PAST SURGICAL HISTORY  Past Surgical History:         Procedure  Laterality  Date          ?  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  CARDIAC SURG PROCEDURE UNLIST              PTCA          ?  CARDIAC SURG PROCEDURE UNLIST    1999          3 STENTS PLACED          ?  CARDIAC SURG PROCEDURE UNLIST              2 ILIAC STENTS          ?  COLONOSCOPY  N/A  07/22/2018          COLONOSCOPY performed by Marlise Eves., MD at [facility] ENDOSCOPY          ?  HX KNEE ARTHROSCOPY  Right            X2          ?  HX ORTHOPAEDIC    2018          [place]          ?   HX ORTHOPAEDIC  Right            ROTATOR CUFF REPAIR          ?  HX OTHER SURGICAL  Right  1970          GANGLION CYST WRIST          ?  HX TONSILLECTOMY    1963                 ALLERGIES          Allergies        Allergen  Reactions         ?  Morphine  Anaphylaxis             Tolerates oxycodone without problem         ?  Soap  Rash     ?  Avalide [Irbesartan-Hydrochlorothiazide]  Myalgia     ?  Betadine [Povidone-Iodine]  Rash     ?  Demerol [Meperidine]  Other (comments)             Makes her feel crazy         ?  Pcn [Penicillins]  Swelling     ?  Prinivil [Lisinopril]  Cough     ?  Statins-Hmg-Coa Reductase Inhibitors  Myalgia     ?  Sulfa (Sulfonamide Antibiotics)  Unknown (comments)         ?  Tricor [Fenofibrate Micronized]  Myalgia                 FAMILY HISTORY          Family History         Problem  Relation  Age of Onset          ?  Lung Disease  Mother  9     ?  Other  Mother                ANEURYSM          ?  Stroke  Father       ?  Heart Disease  Father       ?  Lung Disease  Brother  42              COPD          ?  Hypertension  Brother       ?  Cancer  Brother                PROSTATE          ?  Heart Disease  Brother       ?  Diabetes  Brother       ?  Other  Brother                SEPSIS          ?  No Known Problems  Paternal Grandmother            ?  Anesth Problems  Neg Hx        negative for cardiac disease            SOCIAL HISTORY          Social History          Socioeconomic History         ?  Marital status:  MARRIED              Spouse name:  Not on file         ?  Number of children:  Not on file     ?  Years of education:  Not on file     ?  Highest education level:  Not on file       Tobacco Use         ?  Smoking status:  Former Smoker              Packs/day:  0.30         Years:  45.00         Pack years:  13.50         Types:  Cigarettes         ?  Smokeless tobacco:  Never Used        ?  Tobacco comment: quit around the first of November       Substance and Sexual Activity          ?  Alcohol use:  Yes              Frequency:  Monthly or less         Drinks per session:  1 or 2         Binge frequency:  Never             Comment: may be 3 times a year if that         ?  Drug use:  No                MEDICATIONS          Current Outpatient Medications        Medication  Sig         ?  carvediloL (COREG) 12.5 mg tablet  TAKE 1 TABLET BY MOUTH TWICE A DAY     ?  gabapentin (NEURONTIN) 300 mg capsule  Take 3 Caps by mouth three (3) times daily. Max Daily Amount: 2,700 mg.     ?  metFORMIN ER (GLUCOPHAGE XR) 500 mg tablet  Reports 2 tabs am & pm     ?  losartan (COZAAR) 25 mg tablet  TAKE 1 TAB BY MOUTH DAILY.     ?  insulin detemir U-100 (Levemir FlexTouch U-100 Insuln) 100 unit/mL (3 mL) inpn  INJECT 34 UNITS UNDER THE SKIN NIGHTLY AT BEDTIME     ?  ezetimibe (ZETIA) 10 mg tablet  TAKE 1 TAB BY MOUTH NIGHTLY.     ?  bumetanide (BUMEX) 2 mg tablet  Take 1 Tab by mouth daily.     ?  magnesium oxide (MAG-OX) 400 mg tablet  Take 1 Tab by mouth daily.     ?  nabumetone (RELAFEN) 500 mg tablet  One daily prn pain     ?  clopidogreL (PLAVIX) 75 mg tab  TAKE 1 TABLET BY MOUTH EVERY DAY     ?  aspirin delayed-release 81 mg tablet  TAKE 1 TAB BY MOUTH DAILY.     ?  pantoprazole (PROTONIX) 40 mg tablet  TAKE 1 TABLET BY MOUTH DAILY         ?  DULoxetine (CYMBALTA) 60 mg capsule  TAKE 1 CAPSULE BY MOUTH DAILY         ?  solifenacin (VESICARE) 10 mg tablet  TAKE 1 TABLET BY MOUTH DAILY.     ?  nitroglycerin (NITROSTAT) 0.4 mg SL tablet  1 Tab by SubLINGual route every five (5) minutes as needed for Chest Pain for up to 3 doses.     ?  CHANTIX CONTINUING MONTH BOX 1 mg tablet  TAKE 1 TABLET BY MOUTH TWICE A DAY     ?  pravastatin (PRAVACHOL) 40 mg tablet  TAKE 1 TAB BY MOUTH NIGHTLY.     ?  cholecalciferol (VITAMIN D3) 2,000 unit cap capsule  TAKE 1 CAPSULE BY MOUTH EVERY DAY     ?  lidocaine (LIDODERM) 5 %  Apply patch to the affected area for 12 hours a day and remove for 12 hours a day. (Patient  taking differently: 1 Patch by TransDERmal route daily  as needed. Apply patch to the affected area for 12 hours a day and remove for 12 hours a day.)     ?  doxycycline (ADOXA) 100 mg tablet  Take 1 Tab by mouth two (2) times a day.         ?  lubiPROStone (AMITIZA) 24 mcg capsule  Take 24 mcg by mouth two (2) times daily (with meals).          No current facility-administered medications for this visit.            I have reviewed the nurses notes, vitals, problem list, allergy list, medical history, family, social history and medications.            REVIEW OF SYMPTOMS        Constitutional: Negative for fever, chills, malaise/fatigue and diaphoresis.    Respiratory: Negative for cough, hemoptysis, sputum production, shortness of breath and wheezing.    Cardiovascular: Negative for chest pain, palpitations, orthopnea, claudication, leg swelling and PND.   Gastrointestinal: Negative for heartburn, nausea, vomiting, blood in stool and melena.   Genitourinary: Negative for dysuria and flank pain.   Musculoskeletal: Negative for joint pain and back pain.   Skin: Negative for rash.   Neurological: Negative for focal weakness, seizures, loss of consciousness, weakness and headaches.   Endo/Heme/Allergies: Negative for abnormal bleeding.     Psychiatric/Behavioral: Negative for memory loss.          PHYSICAL EXAMINATION  Due to this being a TeleHealth evaluation, many elements of the physical examination are unable to be assessed.       General: Well developed, in no acute distress, cooperative and alert   HEENT: Pupils equal/round. No marked JVD visible on video.   Respiratory: No audible wheezing, no signs of respiratory distress, lips non cyanotic   Extremities:  No edema   Neuro: A&Ox3, speech clear, no facial droop, answering questions appropriately   Skin: Skin color is normal. No rashes or lesions. Non diaphoretic on visible skin during exam            DIAGNOSTIC DATA         No specialty comments  available.            LABORATORY DATA           Lab Results         Component  Value  Date/Time            WBC  10.5  10/04/2018 10:43 AM       HGB (POC)  10.9 (A)  12/01/2018 11:20 AM       HGB  11.3 (L)  10/04/2018 10:43 AM       HCT (POC)  33.5 (A)  12/01/2018 11:20 AM       HCT  36.4  10/04/2018 10:43 AM       PLATELET  387  10/04/2018 10:43 AM            MCV  89.4  10/04/2018 10:43 AM           Lab Results         Component  Value  Date/Time            Sodium  137  12/01/2018 10:59 AM       Potassium  4.5  12/01/2018 10:59 AM       Chloride  97  12/01/2018 10:59 AM       CO2  23  12/01/2018 10:59 AM       Anion gap  9  10/04/2018 10:43 AM       Glucose  129 (H)  12/01/2018 10:59 AM       BUN  28 (H)  12/01/2018 10:59 AM       Creatinine  1.01 (H)  12/01/2018 10:59 AM       BUN/Creatinine ratio  28  12/01/2018 10:59 AM       GFR est AA  67  12/01/2018 10:59 AM       GFR est non-AA  58 (L)  12/01/2018 10:59 AM       Calcium  9.2  12/01/2018 10:59 AM       Bilirubin, total  0.2  12/01/2018 10:59 AM       AST (SGOT)  13  12/01/2018 10:59 AM       Alk. phosphatase  66  12/01/2018 10:59 AM       Protein, total  6.2  12/01/2018 10:59 AM       Albumin  3.9  12/01/2018 10:59 AM       Globulin  3.0  02/23/2018 02:26 AM       A-G Ratio  1.7  12/01/2018 10:59 AM            ALT (SGPT)  9  12/01/2018 10:59 AM                     FOLLOW-UP  Patient was made aware and verbalized understanding that an appointment will be scheduled for them for a virtual visit and/or office visit within the above time frame. Patient understanding his/her responsibility to call and change time/date if he/she  so chooses.      Thank you, Pahle, Candiss Norse, MD for allowing me to participate in the care of Kelsey [name]. Please do not hesitate to contact me for further questions/concerns.       Greater than 20 minutes was spent in direct video patient care, planning and chart review.   This visit was conducted using [email or web address]  telemedicine services.          Telford Nab, MD      [facility]         [address] [place], [place]   [place], [place]         [phone] / [phone] Fax         [facility]   [place]   [place], [place]   [phone] / [phone] Fax

## 2019-04-08 NOTE — Progress Notes (Signed)
Pt states she get SOB no matter what she is doing, needs to stop and catch her breath, swelling in ankles and feet left side is worse than right.  x2-3 months

## 2019-04-08 NOTE — Progress Notes (Signed)
[facility]  [phone]    HPI: Kelsey [name], a 68 y.o. year-old who presents for follow up regarding her CAD.    Having worsening dyspnea and swelling L>R  She has always been out of breath but she feels like she is just short of breath all the time now. Takes her less than a minute to catch her breath but she is really out of breath and unjable to breathe, it is severe. No chest pain. NO recent change, stable form a month ago but worse than 6 months ago. She thinks she has picked up some fluid weight. Thinks that she has lost weight. Without trying. Maybe 35 pounds. Not eating as much but eating ok, just not as hungry.   Last colonoscopy last year, few years since mammo.      She is weaker than she used to be. She does have a 100% RCA which may contribute but it is difficult to know how much is coronary.   Discussed her extensive plaque in the arteries of the body. .    Stable to proceed with upcoming surgery on 12/10. No progressive cad sx to warrant pci of cto RCA at this time. But potential future consideration. Needs elbow surgery more urgently.     HX claudication, ongoing bilateral calves,was seeing Dr. [name] now, angiogram by someone else resulted in an embolus to the right little toe.   Last visit worse dizziness and dyspnea. Dyspnea has stabilized,, dizziness better on lower coreg. Also has vertebral artery stenosis by MRA.   Not checking her glucose but a1C looking good.     Mild edema. Less active since her hip replacement earlier this year.   Not using CPAP, due to reassess with home sleep study.  Discussed the importance of treatment of her OSA   Dr. Fanny Skates handles pain management     She has complex vascular disease and multiple issues we reviewed today.   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 Dr. [name]   Progressive DOE, concerning for CAD progression, with complex CAD and CTO, last cath 8/17 no targets for intervention, sx have stabilized now  Progressive dizziness somewhat better after cutting coreg but still there.  Has had progression of carotid plaque, will as Vascular to review. CTA neck fu per Dr. [name]      Historic  She was hospitalized at [facility] on 10/15/16 when she presented to ER with elevated BP, jaw/throat/facial tightness and dyspnea  She had just been started losartan on 10/14/16 for elevated BP  Her troponin bumped to 0.71 and she underwent another cardiac cath which did not show any new CAD, no PCI performed  She was discharged but then came back to ER with right groin hematoma and acute anemia with Hgb 6.9, hypotension and dyspnea  She received 2 units of PRBCs, groin [place] was negative, right leg venous duplex was negative and she was discharged on 10/21/16 and her Hgb was 9.2 at the time  Her angina equivalent was jaw pain in the past  Has balance issues, on gabapentin for neuropathy, walks with a cane  Has had short term memory issues since her CABG/AVR  Difficulty with word finding, cognitive abilities are more limited  Has a hard time remembering people's names  Advised to follow back up with neurology previously     Good friend of Kelsey [name] who passed  away, former patient here  Her wife is here with her today    She has moved to [facility].   So at this time she needs to get established with a PCP there.   Recommend she consider Pulmonary and cardiology consultation there. And may need HiRes CT of the chest to evaluate the findings further. Needs to get her Mammo with the weight loss.   May need echo and nuclear stress to look for CAD progression.   She will establish care in [facility] as does not plan to return to [place] at this time.      Assessment/Plan:  1.  CAD - hx of MI and PCI x 3, had 3 vessel CABG 06/08/2012 by Dr. Myles Gip, s/p PCI/DES to diagonal 08/29/16, no progression of CAD on cardiac cath 10/17   -continue ASA, Plavix, Coreg and statin  -has Rx for NTG SL tabs PRN chest pain  2.  Severe AS - s/p bioprosthetic bovine AVR 06/08/2012, stable by TTE 10/18   3.  Carotid stenosis - bilateral, progressive, on ASA and statin, get CTA  4.  PAD s/p stent in distal right SFA - concern for progression, more claudication, needs vascular eval and CTA with runoff  5.  Dyslipidemia - at goal in 4/18, on zetia and pravastatin 8m daily, had myalgias on fenofibrate  -NMR at goal  6.  DM Type 2 - Hgb A1c 6.o0, on insulin doing well  7.  HTN - hypotensive, last visit, cut down on coreg 12.580mBID and now bp is better but still has dyspnea, fatigue, claudication and dizziness  8.  Chronic pain/arthritis - s/p hip surgery Dr. Kump/orthopedics  9.  Vitamin D deficiency - on Vitamin D 1000 units daily   10.  RBBB - chronic  11.  Hyponatremia - chronic, stable  12.  Anemia - stable  13.  Enlarged aorta on exam -+ atherosclerosis per Dr. LeElon Spanner14.  Dyspnea/edema - repeat cath for sx progression    Echo 10/18 gr1dd, EF57% mild AS AVR, mild mr, mac  Aortic duplex 2/18 - aorta normal in size, no aneurysm  ABI 2/18 - right 0.6, left 0.7    Cardiac Cath 10/17 -LCA:  LAD: 30% proximal after large D1, with competitive inflow from LIMA to mid LAD, D1 recently stented widely patent, LCX: 80% after small MOM, before large PLOM which has competitive SVG inflow, RCA: occluded mid, LIMA --> LAD widely patent unobstructed anasomosis, SVG --> PLOM widely patent, SVG --> LVBr of distal RCA widely patent, moderate disease proximal to SVG insertion, yielding ischemic potential in small PDA (similar to cath 6 weeks ago, not fixable and VERY unlikely to be problematic).  IMP: CAD post PCI, CABG with very limited ischemic potential  Cardiac Cath 9/17 - Patent LIMA to LAD, SVG's to RCA and LCX.  Very large diagonal has eccentric 90% stenosis, treated with 2.75 x 12 Xience DES good result.  Bioprosthetic AV not crossed given recent echo.   Echo 8/17 - LVEF 55-60%, no WMA, wall thickness was increased, dilated LA, atrial septum thickened, mildly dilated RA, mild MR  Carotid Duplex 8/17 -  50-79% R ICA stenosis, 10-49% stenosis of the L ICA, >50% stenosis of the left ECA  ABI 12/16 - right 0.82 and left 0.88  Echo 6/16 - LVEF 55%, no WMA, bovine AVR with normal function, no AI, mild AS, no pericardial effusion, grd 1 dd, MAC with calcified chords noted with mild sclerosis, mild MR, trace TR  Carotid duplex 6/16 - right 50-69% prox ICA stenosis with moderate hemodynamic compromise, left 1-49% prox ICA stenosis with mild hemodynamic compromise, no change compared to 2014  ABI 12/15 - right 0.70 and left 0.88  Peripheral angiogram 5/15 - stent in distal right SFA, stent in left common iliac artery  ABI 5/15 - right 0.68 and left 0.88  Abd CTA 5/15 - significant PVD, 80% proximal stenosis in celiac plexus, abdominal aorta with diffuse calcification and mural thrombus, mild disease in right and left renal arteries, moderate right iliac disease with CFA stenosis, SFA with moderate disease, left common iliac with ulcerated plaque proximally, moderate to severe disease in iliac system, SFA with moderate to severe diffuse disease  Echo 09/2012 - LVEF 45-50%, AVR ok, mild MR, mild TR  06/08/12 -CABG x 3 (LIMA to LAD, SVG to OM1, SVG to PLB) and 21 mm [facility] Ease pericardial prosthesis at [facility]  Cardiac Cath 06/04/2012 - mod AS (32m gradient), mod AI, diffuse 3 vessel CAD  Cardiac Cath 05/2011 - LVEF 30%, occluded mid RCA, high grade stenosis distal LCx, mild AS s/p PCI/DES to LCx by Dr. CCurrie Paris Cardiac Cath 02/2009 - patent stents in RCA and LCx  PCI to LCx 2001  MI/PCI to RCA 2000    Soc Hx: smokes 1/4 ppd, drinks 2 glasses of wine/month, no drug use  Fam Hx: father had CVA at age 665 had a couple of MIs in his 55sand had arteries replaced with teflon, mother passed at age 643from aortic  aneurysm, brother passed age 6642from sepsis and had bad PVD with multiple bypass surgeries    She  has a past medical history of Acute MI ([place], Adverse effect of anesthesia, Arthritis, CAD (coronary artery disease), Cervical spondylolysis, Chronic pain, COPD, Diabetes ([place], GERD (gastroesophageal reflux disease), Goiter (04/20/2012), Hypertension, and Neuropathy (04/06/2013).    Cardiovascular ROS: no chest pain, positive for dyspnea, edema, dizziness and fatigue   Respiratory ROS: no cough or wheezing  Neurological ROS: no TIA or stroke symptoms  All other systems negative except as above.     PE  Vitals:    04/08/19 1104   Weight: 175 lb (79.4 kg)   Height: _0  (1.626 m)    Body mass index is 30.04 kg/m??.   General appearance - alert, well appearing, and in no distress  Mental status - affect appropriate to mood  Eyes - sclera anicteric, moist mucous membranes  MSK moving all extrem  Lymphatics - not assessed  Extremities - , 1+ LE edema (left > right)    Skin - normal coloration  no rashes    12 lead ECG: NSR with RBBB, first degree AV block, non-specific ST-T wave changes     Recent Labs:  Lab Results   Component Value Date/Time    Cholesterol, total 90 10/16/2016 03:36 AM    Cholesterol, Total 122 09/23/2017 12:13 PM    HDL Cholesterol 46 10/16/2016 03:36 AM    LDL, calculated 18.2 10/16/2016 03:36 AM    Triglyceride 129 10/16/2016 03:36 AM    CHOL/HDL Ratio 2.0 10/16/2016 03:36 AM     Lab Results   Component Value Date/Time    Creatinine 1.01 (H) 12/01/2018 10:59 AM     Lab Results   Component Value Date/Time    BUN 28 (H) 12/01/2018 10:59 AM     Lab Results   Component Value Date/Time    Potassium 4.5 12/01/2018 10:59 AM     Lab  Results   Component Value Date/Time    Hemoglobin A1c 6.4 (H) 10/04/2018 10:43 AM     Lab Results   Component Value Date/Time    HGB 11.3 (L) 10/04/2018 10:43 AM     Lab Results   Component Value Date/Time    PLATELET 387 10/04/2018 10:43 AM       Reviewed:  Past Medical History:    Diagnosis Date   ??? Acute MI ([place])     x3   ??? 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 Arthritis    ??? CAD (coronary artery disease)    ??? Cervical spondylolysis    ??? Chronic pain    ??? COPD    ??? Diabetes ([place])    ??? GERD (gastroesophageal reflux disease)    ??? Goiter 04/20/2012   ??? Hypertension    ??? Neuropathy 04/06/2013     Social History     Tobacco Use   Smoking Status Former Smoker   ??? Packs/day: 0.30   ??? Years: 45.00   ??? Pack years: 13.50   ??? Types: Cigarettes   Smokeless Tobacco Never Used   Tobacco Comment    quit around the first of November     Social History     Substance and Sexual Activity   Alcohol Use Yes   ??? Frequency: Monthly or less   ??? Drinks per session: 1 or 2   ??? Binge frequency: Never    Comment: may be 3 times a year if that     Allergies   Allergen Reactions   ??? Morphine Anaphylaxis     Tolerates oxycodone without problem   ??? Soap Rash   ??? Avalide [Irbesartan-Hydrochlorothiazide] Myalgia   ??? Betadine [Povidone-Iodine] Rash   ??? Demerol [Meperidine] Other (comments)     Makes her feel crazy   ??? Pcn [Penicillins] Swelling   ??? Prinivil [Lisinopril] Cough   ??? Statins-Hmg-Coa Reductase Inhibitors Myalgia   ??? Sulfa (Sulfonamide Antibiotics) Unknown (comments)   ??? Tricor [Fenofibrate Micronized] Myalgia       Current Outpatient Medications   Medication Sig   ??? carvediloL (COREG) 12.5 mg tablet TAKE 1 TABLET BY MOUTH TWICE A DAY   ??? gabapentin (NEURONTIN) 300 mg capsule Take 3 Caps by mouth three (3) times daily. Max Daily Amount: 2,700 mg.   ??? metFORMIN ER (GLUCOPHAGE XR) 500 mg tablet Reports 2 tabs am & pm   ??? losartan (COZAAR) 25 mg tablet TAKE 1 TAB BY MOUTH DAILY.   ??? insulin detemir U-100 (Levemir FlexTouch U-100 Insuln) 100 unit/mL (3 mL) inpn INJECT 34 UNITS UNDER THE SKIN NIGHTLY AT BEDTIME   ??? ezetimibe (ZETIA) 10 mg tablet TAKE 1 TAB BY MOUTH NIGHTLY.   ??? bumetanide (BUMEX) 2 mg tablet Take 1 Tab by mouth daily.    ??? magnesium oxide (MAG-OX) 400 mg tablet Take 1 Tab by mouth daily.   ??? nabumetone (RELAFEN) 500 mg tablet One daily prn pain   ??? clopidogreL (PLAVIX) 75 mg tab TAKE 1 TABLET BY MOUTH EVERY DAY   ??? aspirin delayed-release 81 mg tablet TAKE 1 TAB BY MOUTH DAILY.   ??? pantoprazole (PROTONIX) 40 mg tablet TAKE 1 TABLET BY MOUTH DAILY   ??? DULoxetine (CYMBALTA) 60 mg capsule TAKE 1 CAPSULE BY MOUTH DAILY   ??? solifenacin (VESICARE) 10 mg tablet TAKE 1 TABLET BY MOUTH DAILY.   ??? nitroglycerin (NITROSTAT) 0.4 mg SL  tablet 1 Tab by SubLINGual route every five (5) minutes as needed for Chest Pain for up to 3 doses.   ??? CHANTIX CONTINUING MONTH BOX 1 mg tablet TAKE 1 TABLET BY MOUTH TWICE A DAY   ??? pravastatin (PRAVACHOL) 40 mg tablet TAKE 1 TAB BY MOUTH NIGHTLY.   ??? cholecalciferol (VITAMIN D3) 2,000 unit cap capsule TAKE 1 CAPSULE BY MOUTH EVERY DAY   ??? lidocaine (LIDODERM) 5 % Apply patch to the affected area for 12 hours a day and remove for 12 hours a day. (Patient taking differently: 1 Patch by TransDERmal route daily as needed. Apply patch to the affected area for 12 hours a day and remove for 12 hours a day.)   ??? doxycycline (ADOXA) 100 mg tablet Take 1 Tab by mouth two (2) times a day.   ??? lubiPROStone (AMITIZA) 24 mcg capsule Take 24 mcg by mouth two (2) times daily (with meals).     No current facility-administered medications for this visit.        Telford Nab, MD  [facility], [address]  [place], [place]  [phone]          VIRTUAL VISIT DOCUMENTATION     Pursuant to the emergency declaration under the [place], 1135 waiver authority and the [employer] Act, this Virtual  Visit was conducted, with patient's consent, to reduce the patient's risk of exposure to COVID-19 and provide continuity of care for an established patient.      Services were provided through a video synchronous discussion virtually to substitute for in-person clinic visit.    CHIEF COMPLAINT      Kelsey [name] is a 68 y.o. female who was seen by synchronous (real-time) audio-video technology on 04/08/2019.  Patient is being seen today for      ASSESSMENT           PLAN       We discussed the expected course, resolution and complications of the diagnosis(es) in detail.  Medication risks, benefits, costs, interactions, and alternatives were discussed as indicated.  I advised her to contact the office if her condition worsens, changes or fails to improve as anticipated. She expressed understanding with the diagnosis(es) and plan    HISTORY OF PRESENTING ILLNESS      Kelsey [name] is a 68 y.o. female        ACTIVE PROBLEM LIST     Patient Active Problem List    Diagnosis Date Noted   ??? Chronic idiopathic constipation 05/26/2018   ??? Hip pain 02/22/2018   ??? Aortic mural thrombus (H[facility]) 02/15/2018   ??? Severe obesity with body mass index (BMI) of 35.0 to 39.9 with serious comorbidity ([place]) 06/30/2017   ??? Type 2 diabetes mellitus with nephropathy ([place]) 01/08/2017   ??? Type 2 diabetes mellitus with diabetic neuropathy ([place]) 01/08/2017   ??? Atherosclerosis of aorta ([place]) 01/08/2017   ??? Anemia 10/20/2016   ??? CAD (coronary artery disease) 10/16/2016   ??? 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 Hypertensive urgency 10/16/2016   ??? Long term (current) use of insulin ([place]) 03/04/2016   ??? Hypertension complicating diabetes ([place]) 03/04/2016   ??? Neuropathy 04/06/2013   ??? Goiter 04/20/2012   ??? Dyspnea 03/13/2011   ??? Coronary atherosclerosis of native coronary artery 12/24/2009   ??? Other chest pain 12/24/2009   ??? Other dyspnea and respiratory abnormality 12/24/2009   ???  Mixed hyperlipidemia 12/24/2009   ??? Type II diabetes mellitus, uncontrolled ([place]) 12/24/2009           PAST MEDICAL HISTORY     Past Medical History:   Diagnosis Date   ??? Acute MI ([place])     x3   ??? Adverse effect of anesthesia      pt states she lost memory after cabg in j2013   ??? Arthritis    ??? CAD (coronary artery disease)    ??? Cervical spondylolysis    ??? Chronic pain    ??? COPD    ??? Diabetes ([place])    ??? GERD (gastroesophageal reflux disease)    ??? Goiter 04/20/2012   ??? Hypertension    ??? Neuropathy 04/06/2013           PAST SURGICAL HISTORY     Past Surgical History:   Procedure Laterality Date   ??? 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 CARDIAC SURG PROCEDURE UNLIST      PTCA   ??? CARDIAC SURG PROCEDURE UNLIST  1999    3 STENTS PLACED   ??? CARDIAC SURG PROCEDURE UNLIST      2 ILIAC STENTS   ??? COLONOSCOPY N/A 07/22/2018    COLONOSCOPY performed by Marlise Eves., MD at [facility] ENDOSCOPY   ??? HX KNEE ARTHROSCOPY Right     X2   ??? HX ORTHOPAEDIC  2018    [employer]   ??? HX ORTHOPAEDIC Right     ROTATOR CUFF REPAIR   ??? HX OTHER SURGICAL Right 1970    GANGLION CYST WRIST   ??? HX TONSILLECTOMY  1963          ALLERGIES     Allergies   Allergen Reactions   ??? Morphine Anaphylaxis     Tolerates oxycodone without problem   ??? Soap Rash   ??? Avalide [Irbesartan-Hydrochlorothiazide] Myalgia   ??? Betadine [Povidone-Iodine] Rash   ??? Demerol [Meperidine] Other (comments)     Makes her feel crazy   ??? Pcn [Penicillins] Swelling   ??? Prinivil [Lisinopril] Cough   ??? Statins-Hmg-Coa Reductase Inhibitors Myalgia   ??? Sulfa (Sulfonamide Antibiotics) Unknown (comments)   ??? Tricor [Fenofibrate Micronized] Myalgia          FAMILY HISTORY     Family History   Problem Relation Age of Onset   ??? Lung Disease Mother 45   ??? Other Mother         ANEURYSM   ??? Stroke Father    ??? Heart Disease Father    ??? Lung Disease Brother 48        COPD   ??? Hypertension Brother    ??? Cancer Brother         PROSTATE   ??? Heart Disease Brother    ??? Diabetes Brother    ??? Other Brother         SEPSIS   ??? No Known Problems Paternal Grandmother    ??? Anesth Problems Neg Hx     negative for cardiac disease       SOCIAL HISTORY     Social History     Socioeconomic History   ??? Marital status: MARRIED      Spouse name: Not on file   ??? Number of children: Not on file   ??? Years of education: Not on file   ??? Highest education level: Not on file   Tobacco Use   ??? Smoking status: Former Smoker  Packs/day: 0.30     Years: 45.00     Pack years: 13.50     Types: Cigarettes   ??? Smokeless tobacco: Never Used   ??? Tobacco comment: quit around the first of November   Substance and Sexual Activity   ??? Alcohol use: Yes     Frequency: Monthly or less     Drinks per session: 1 or 2     Binge frequency: Never     Comment: may be 3 times a year if that   ??? Drug use: No         MEDICATIONS     Current Outpatient Medications   Medication Sig   ??? carvediloL (COREG) 12.5 mg tablet TAKE 1 TABLET BY MOUTH TWICE A DAY   ??? gabapentin (NEURONTIN) 300 mg capsule Take 3 Caps by mouth three (3) times daily. Max Daily Amount: 2,700 mg.   ??? metFORMIN ER (GLUCOPHAGE XR) 500 mg tablet Reports 2 tabs am & pm   ??? losartan (COZAAR) 25 mg tablet TAKE 1 TAB BY MOUTH DAILY.   ??? insulin detemir U-100 (Levemir FlexTouch U-100 Insuln) 100 unit/mL (3 mL) inpn INJECT 34 UNITS UNDER THE SKIN NIGHTLY AT BEDTIME   ??? ezetimibe (ZETIA) 10 mg tablet TAKE 1 TAB BY MOUTH NIGHTLY.   ??? bumetanide (BUMEX) 2 mg tablet Take 1 Tab by mouth daily.   ??? magnesium oxide (MAG-OX) 400 mg tablet Take 1 Tab by mouth daily.   ??? nabumetone (RELAFEN) 500 mg tablet One daily prn pain   ??? clopidogreL (PLAVIX) 75 mg tab TAKE 1 TABLET BY MOUTH EVERY DAY   ??? aspirin delayed-release 81 mg tablet TAKE 1 TAB BY MOUTH DAILY.   ??? pantoprazole (PROTONIX) 40 mg tablet TAKE 1 TABLET BY MOUTH DAILY   ??? DULoxetine (CYMBALTA) 60 mg capsule TAKE 1 CAPSULE BY MOUTH DAILY   ??? solifenacin (VESICARE) 10 mg tablet TAKE 1 TABLET BY MOUTH DAILY.   ??? nitroglycerin (NITROSTAT) 0.4 mg SL tablet 1 Tab by SubLINGual route every five (5) minutes as needed for Chest Pain for up to 3 doses.   ??? CHANTIX CONTINUING MONTH BOX 1 mg tablet TAKE 1 TABLET BY MOUTH TWICE A DAY    ??? pravastatin (PRAVACHOL) 40 mg tablet TAKE 1 TAB BY MOUTH NIGHTLY.   ??? cholecalciferol (VITAMIN D3) 2,000 unit cap capsule TAKE 1 CAPSULE BY MOUTH EVERY DAY   ??? lidocaine (LIDODERM) 5 % Apply patch to the affected area for 12 hours a day and remove for 12 hours a day. (Patient taking differently: 1 Patch by TransDERmal route daily as needed. Apply patch to the affected area for 12 hours a day and remove for 12 hours a day.)   ??? doxycycline (ADOXA) 100 mg tablet Take 1 Tab by mouth two (2) times a day.   ??? lubiPROStone (AMITIZA) 24 mcg capsule Take 24 mcg by mouth two (2) times daily (with meals).     No current facility-administered medications for this visit.        I have reviewed the nurses notes, vitals, problem list, allergy list, medical history, family, social history and medications.       REVIEW OF SYMPTOMS     Constitutional: Negative for fever, chills, malaise/fatigue and diaphoresis.   Respiratory: Negative for cough, hemoptysis, sputum production, shortness of breath and wheezing.   Cardiovascular: Negative for chest pain, palpitations, orthopnea, claudication, leg swelling and PND.  Gastrointestinal: Negative for heartburn, nausea, vomiting, blood in stool and melena.  Genitourinary: Negative for dysuria and flank pain.  Musculoskeletal: Negative for joint pain and back pain.  Skin: Negative for rash.  Neurological: Negative for focal weakness, seizures, loss of consciousness, weakness and headaches.  Endo/Heme/Allergies: Negative for abnormal bleeding.    Psychiatric/Behavioral: Negative for memory loss.      PHYSICAL EXAMINATION      Due to this being a TeleHealth evaluation, many elements of the physical examination are unable to be assessed.     General: Well developed, in no acute distress, cooperative and alert  HEENT: Pupils equal/round. No marked JVD visible on video.  Respiratory: No audible wheezing, no signs of respiratory distress, lips non cyanotic  Extremities:  No edema   Neuro: A&Ox3, speech clear, no facial droop, answering questions appropriately  Skin: Skin color is normal. No rashes or lesions. Non diaphoretic on visible skin during exam       DIAGNOSTIC DATA      No specialty comments available.       LABORATORY DATA      Lab Results   Component Value Date/Time    WBC 10.5 10/04/2018 10:43 AM    HGB (POC) 10.9 (A) 12/01/2018 11:20 AM    HGB 11.3 (L) 10/04/2018 10:43 AM    HCT (POC) 33.5 (A) 12/01/2018 11:20 AM    HCT 36.4 10/04/2018 10:43 AM    PLATELET 387 10/04/2018 10:43 AM    MCV 89.4 10/04/2018 10:43 AM      Lab Results   Component Value Date/Time    Sodium 137 12/01/2018 10:59 AM    Potassium 4.5 12/01/2018 10:59 AM    Chloride 97 12/01/2018 10:59 AM    CO2 23 12/01/2018 10:59 AM    Anion gap 9 10/04/2018 10:43 AM    Glucose 129 (H) 12/01/2018 10:59 AM    BUN 28 (H) 12/01/2018 10:59 AM    Creatinine 1.01 (H) 12/01/2018 10:59 AM    BUN/Creatinine ratio 28 12/01/2018 10:59 AM    GFR est AA 67 12/01/2018 10:59 AM    GFR est non-AA 58 (L) 12/01/2018 10:59 AM    Calcium 9.2 12/01/2018 10:59 AM    Bilirubin, total 0.2 12/01/2018 10:59 AM    AST (SGOT) 13 12/01/2018 10:59 AM    Alk. phosphatase 66 12/01/2018 10:59 AM    Protein, total 6.2 12/01/2018 10:59 AM    Albumin 3.9 12/01/2018 10:59 AM    Globulin 3.0 02/23/2018 02:26 AM    A-G Ratio 1.7 12/01/2018 10:59 AM    ALT (SGPT) 9 12/01/2018 10:59 AM             FOLLOW-UP            Patient was made aware and verbalized understanding that an appointment will be scheduled for them for a virtual visit and/or office visit within the above time frame. Patient understanding his/her responsibility to call and change time/date if he/she so chooses.    Thank you, [place], Candiss Norse, MD for allowing me to participate in the care of KARRIS DEANGELO. Please do not hesitate to contact me for further questions/concerns.     Greater than 20 minutes was spent in direct video patient care, planning and chart review.   This visit was conducted using [employer] telemedicine services.       Telford Nab, MD    [facility]        [address], [address]  [place], [place]  [address]        [phone] / [phone] Fax       [facility]  [address]  [place], [place]  [phone] / [phone] Fax

## 2019-04-12 MED ORDER — VARENICLINE 1 MG TAB
1 mg | ORAL_TABLET | ORAL | 3 refills | Status: DC
Start: 2019-04-12 — End: 2019-08-20

## 2019-04-19 ENCOUNTER — Encounter

## 2019-04-19 NOTE — Telephone Encounter (Signed)
pls call and find out if she wants this.  I mailed her rx for [place]

## 2019-05-09 ENCOUNTER — Encounter

## 2019-05-09 MED ORDER — GABAPENTIN 300 MG CAP
300 mg | ORAL_CAPSULE | Freq: Three times a day (TID) | ORAL | 1 refills | Status: AC
Start: 2019-05-09 — End: ?

## 2019-05-11 MED ORDER — FREESTYLE LIBRE 14 DAY SENSOR KIT
PACK | 1 refills | Status: AC
Start: 2019-05-11 — End: ?

## 2019-05-22 ENCOUNTER — Encounter

## 2019-05-22 MED ORDER — PANTOPRAZOLE 40 MG TAB, DELAYED RELEASE
40 mg | ORAL_TABLET | ORAL | 0 refills | Status: AC
Start: 2019-05-22 — End: ?

## 2019-05-31 ENCOUNTER — Encounter

## 2019-05-31 MED ORDER — LOSARTAN 25 MG TAB
25 mg | ORAL_TABLET | ORAL | 1 refills | Status: DC
Start: 2019-05-31 — End: 2019-07-25

## 2019-06-07 ENCOUNTER — Encounter

## 2019-06-07 IMAGING — CT CTA NECK
2 of 5 series · 13 of 46 positions shown, 15 images · IV contrast (ISOVUE 300)
Comparison: Comparison was made to the prior exam(s) within the last 12 months 
dated  Carotid ultrasound April 26, 2019

CTA NECK, 06/07/2019 [DATE]: 
CLINICAL INDICATION: Evaluate carotid stenosis 
A search for DICOM formatted images was conducted for prior CT imaging studies 
completed at a non-affiliated media free facility.
TECHNIQUE: The neck was scanned from the maxillary sinuses through AP window 
with 100 cc's of Isovue 370 injected intravenously on a high resolution low dose 
CT scanner using dose reduction techniques.  Routine MPR and MIP 3D renderings 
were reconstructed on an independent workstation with concurrent physician 
supervision.

[Series 5: (person_name) 1.5 b26s · axial · 0.35mm/px · z∈[-325,-76]mm · 10 of 192 slices shown, 12 images]
[im 13/192  soft-tissue]
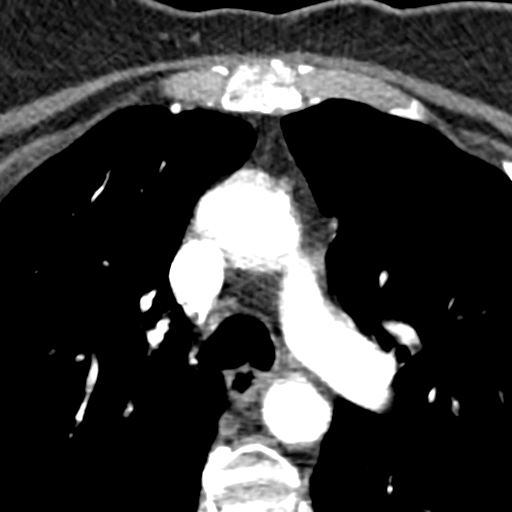
[im 13/192  bone]
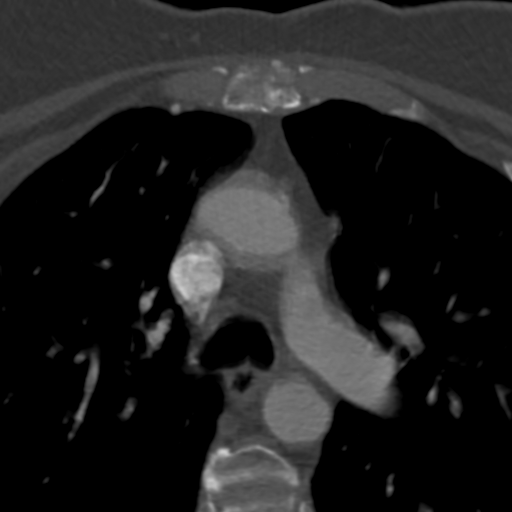
[im 31/192  soft-tissue]
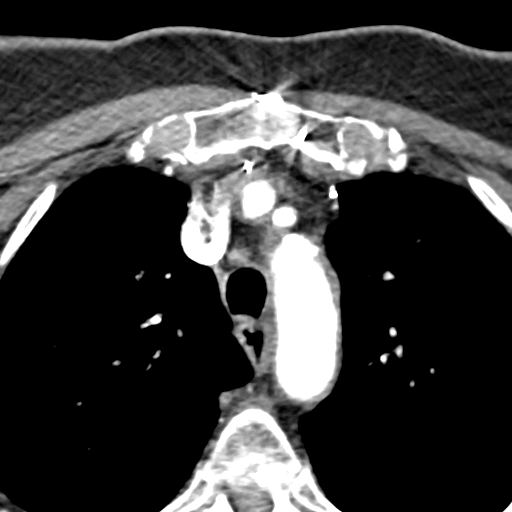
[im 50/192  soft-tissue]
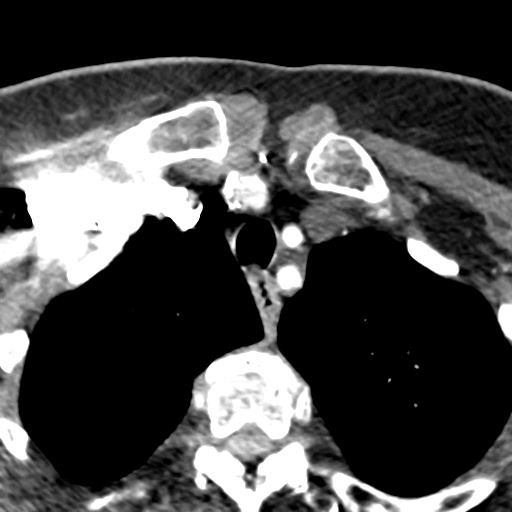
[im 68/192  soft-tissue]
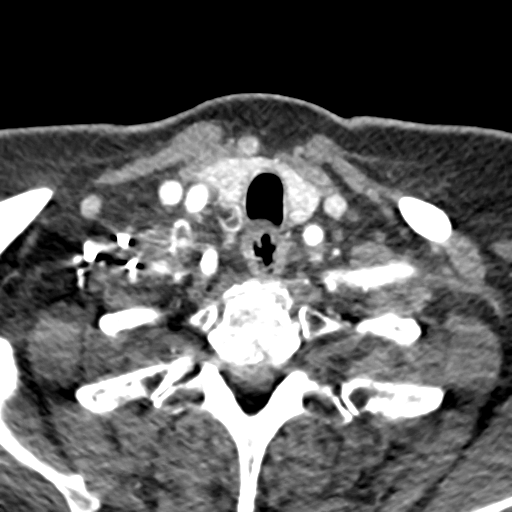
[im 87/192  soft-tissue]
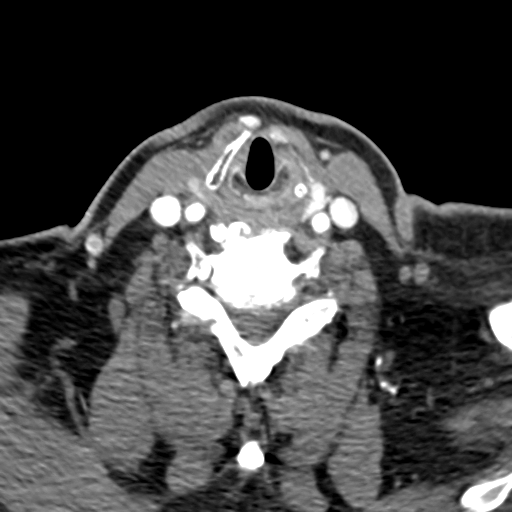
[im 105/192  soft-tissue]
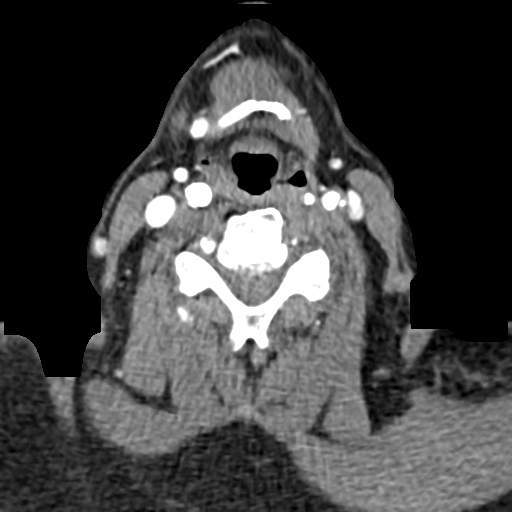
[im 124/192  soft-tissue]
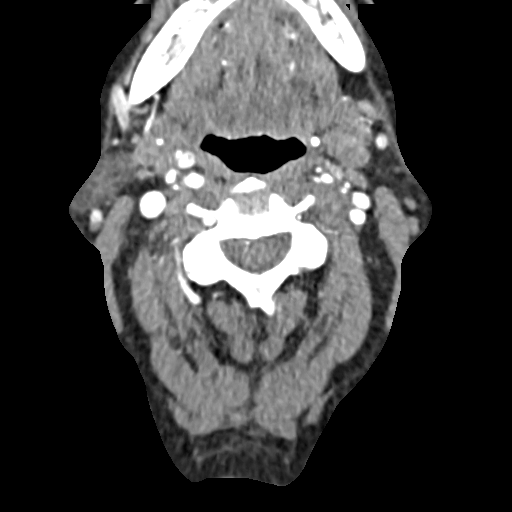
[im 142/192  soft-tissue]
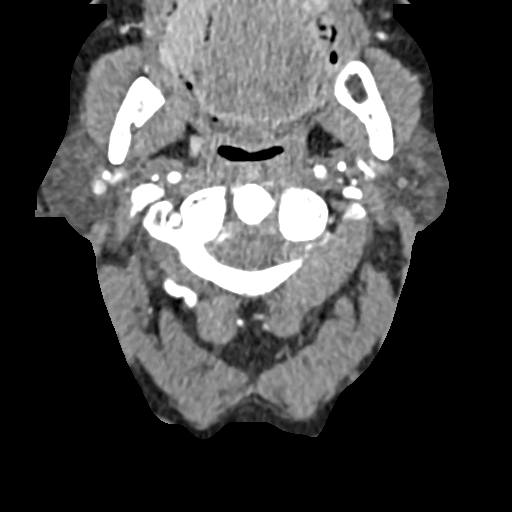
[im 161/192  soft-tissue]
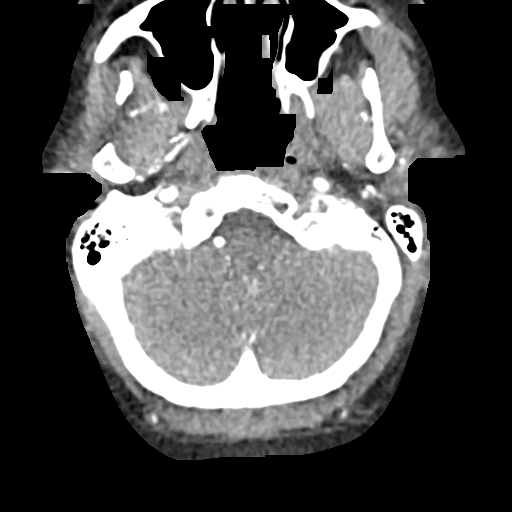
[im 161/192  bone]
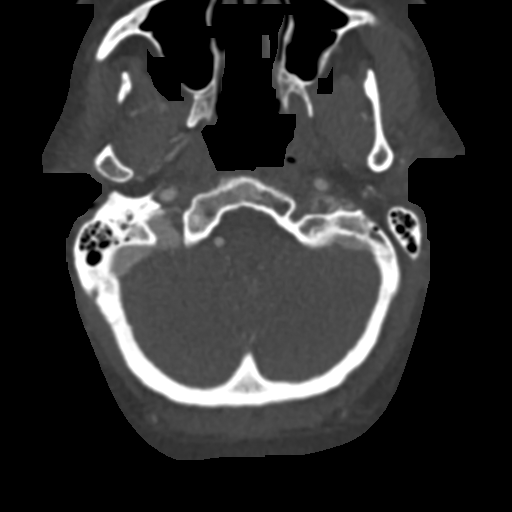
[im 179/192  soft-tissue]
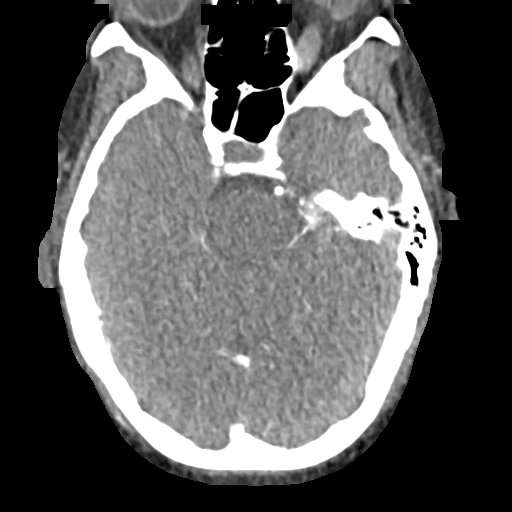

[Series 7: coronal · coronal · 0.34mm/px · 3 of 83 slices shown]
[im 21/83  soft-tissue]
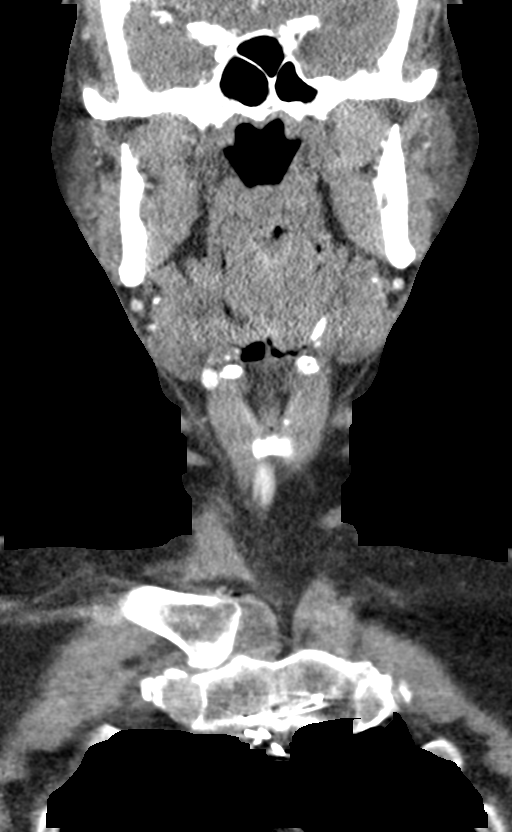
[im 42/83  soft-tissue]
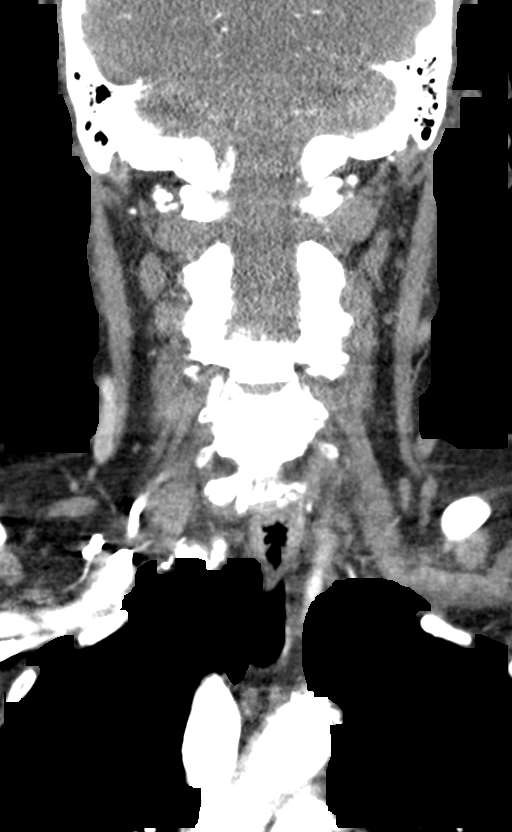
[im 62/83  soft-tissue]
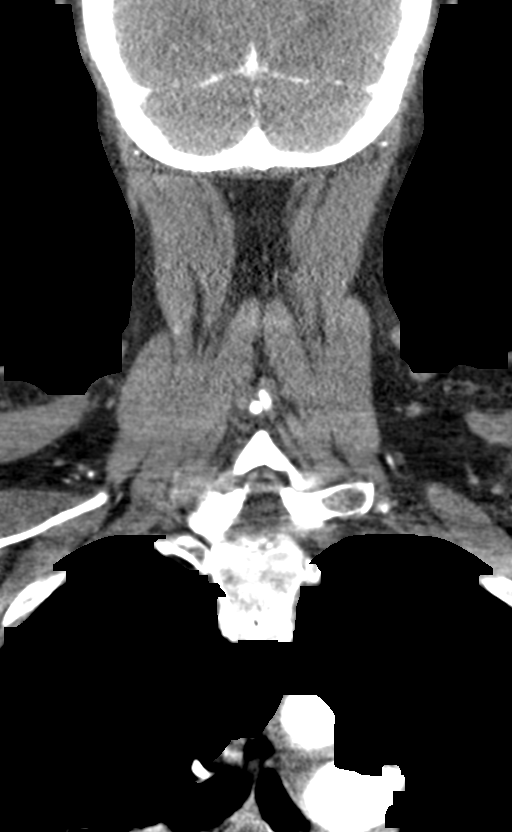

[13 of 46 positions shown; findings below may reference images not displayed]

FINDINGS: The recent carotid ultrasound showed 60-79% stenosis of the left 
internal carotid artery, 40-50% stenosis of the right internal carotid artery. 
Today's CTA shows mild plaque at the origin of the left common carotid artery 
producing approximately 30% stenosis as seen on axial image 164. There is mild 
to moderate atheromatous plaque in the distal left common carotid artery with 
approximately 30% stenosis, image 90. At the left carotid bulb there is 
extensive atheromatous plaque with peripheral calcification. Lumen diameter is 
reduced to 2.5 mm, axial image 77. Stenosis is estimated at 80% using modified 
NASCET criteria, best appreciated on sagittal reconstructed images 19/20. There 
appears to be ulceration of the plaque best seen on sagittal reconstructed image 
20. Above the bulb there is mild reduction in diameter of the left internal 
carotid artery, measuring 3 mm. There is antegrade flow to the skull base. There 
is moderate plaque in the left cavernous and supraclinoid segments, with 
stenosis in the segments estimated at no greater than 40%. The left anterior and 
middle cerebral arteries are open. There is relative hypoplasia of the left A1 
segment. 
The anterior communicating artery is open. 
The brachiocephalic and right common carotid arteries are open. There is mild 
multifocal plaque in the mid to distal right common carotid artery. There 
appears to be a stent spanning the right internal carotid bulb. There is 
narrowing of the stent lumen best seen on axial image 82, with diameter 3 mm. 
Using modified NASCET criteria stenosis in this segment is approximately 50%. 
Above the stent the right internal carotid artery is open with antegrade flow to 
the skull base. There is moderate plaque in the right cavernous segment without 
significant stenosis. The right anterior and middle cerebral arteries are open. 
There is extensive calcific plaque involving the proximal right vertebral 
artery, with stenosis at least 80 % seen on axial images 123-127 and coronal 
reconstructed image 44. There is moderate stenosis of the mid right vertebral 
artery with multifocal plaque. The basilar artery is open. The left vertebral 
artery is hypoplastic throughout its length, providing only minimal supply to 
the basilar artery. There is a small patent right posterior communicating 
artery. The left posterior cerebral artery arises from the basilar tip. There 
appears to be stenosis of the right P1 segment best seen on axial image 6. 
There is no cervical adenopathy or mass. Upper lungs are clear. There has been 
sternotomy and aortocoronary bypass. There is cervical spondylosis, as 
pronounced from C5 through C7.. 
-IMPRESSION: 
Hemodynamically significant stenosis of the left internal carotid bulb with 
partly calcified atheromatous plaque involving the bulb from the origin to 
superior margin. Stenosis estimated at 80%. There also appears to be ulceration 
of the plaque which is a potential source of platelet aggregation. 
Stent crossing the right internal carotid bulb shows narrowing, with 50% luminal 
reduction. 
Severe arteriosclerotic plaque in the proximal right vertebral artery, with 
stenosis at least 80%. The right vertebral artery is dominant and the principal 
supply to the basilar artery. The left vertebral artery is hypoplastic with only 
minimal contribution to the basilar artery. The basilar artery and posterior 
cerebral arteries are open. However there appears to be at least moderate 
stenosis of the right P1 segment. 
Cervical spondylosis. 
Measurements performed using modified NASCET criteria. 
RADIATION DOSE REDUCTION: All CT scans are performed using radiation dose 
reduction techniques, when applicable.  Technical factors are evaluated and 
adjusted to ensure appropriate moderation of exposure.  Automated dose 
management technology is applied to adjust the radiation doses to minimize 
exposure while achieving diagnostic quality images.

## 2019-06-07 MED ORDER — NABUMETONE 500 MG TAB
500 mg | ORAL_TABLET | ORAL | 0 refills | Status: DC
Start: 2019-06-07 — End: 2019-08-06

## 2019-07-08 ENCOUNTER — Encounter

## 2019-07-08 IMAGING — MG MAMMOGRAPHY DIAGNOSTIC BILATERAL 3D TOMOSYNTHESIS WITH CAD
8 series · 8 of 24 positions shown · non-contrast
Comparison: No priors were available for comparison at the time of 
interpretation. 
BREAST DENSITY: (Level B) There are scattered areas of fibroglandular density.

MAMMOGRAPHY DIAGNOSTIC BILATERAL 3D TOMOSYNTHESIS WITH CAD, BREAST ULTRASOUND 
LIMITED UNILATERAL, 07/08/2019 [DATE]: 
CLINICAL INDICATION: Palpable abnormality in the right breast.
TECHNIQUE: Digital bilateral mammograms and 3-D Tomosynthesis were obtained. 
These were interpreted both primarily and with the aid of computer-aided 
detection system.

[R MLO]
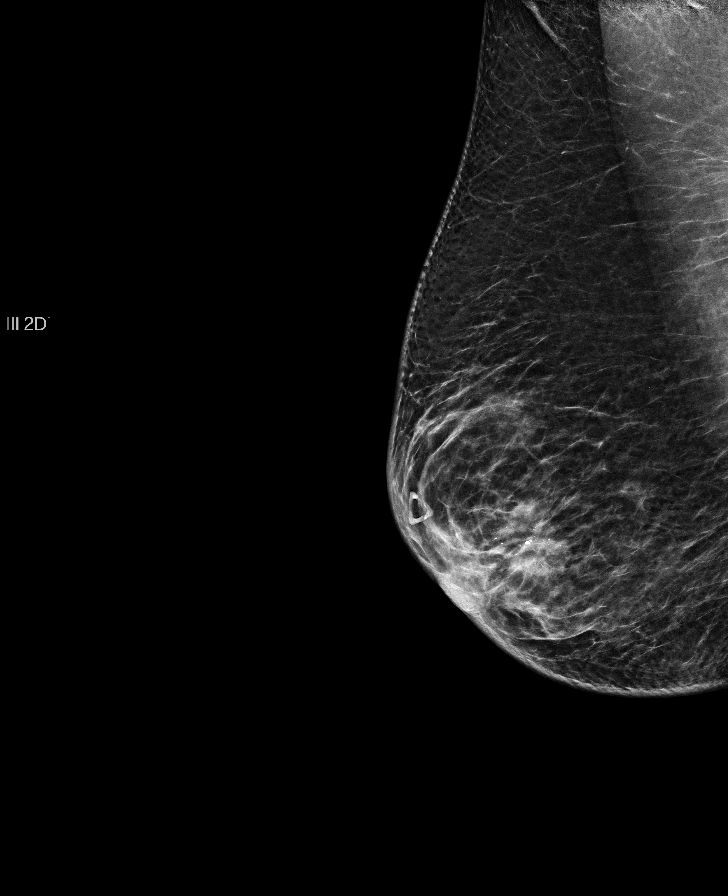

[L MLO]
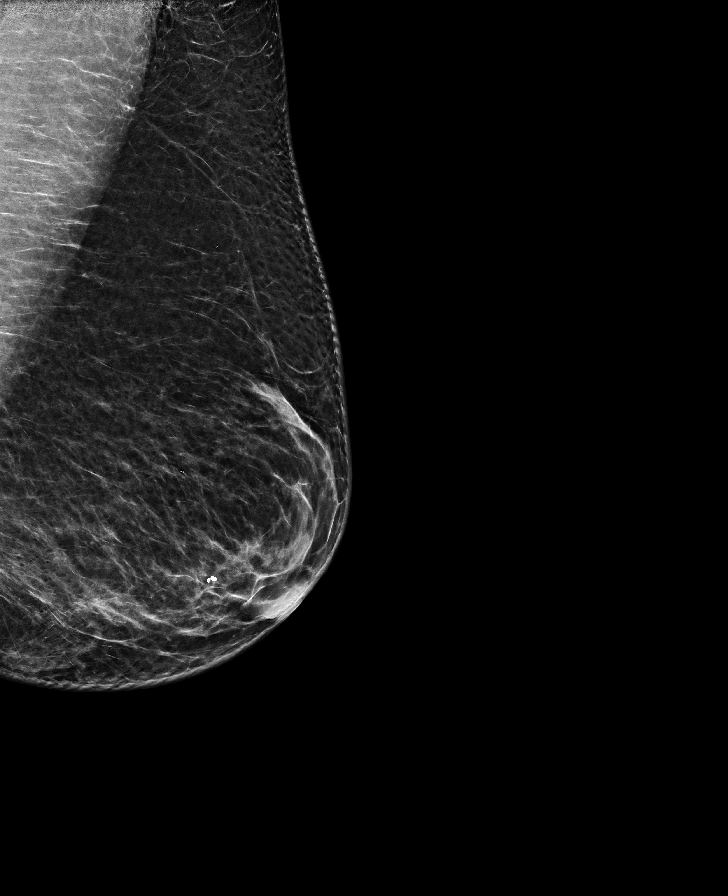

[L CC]
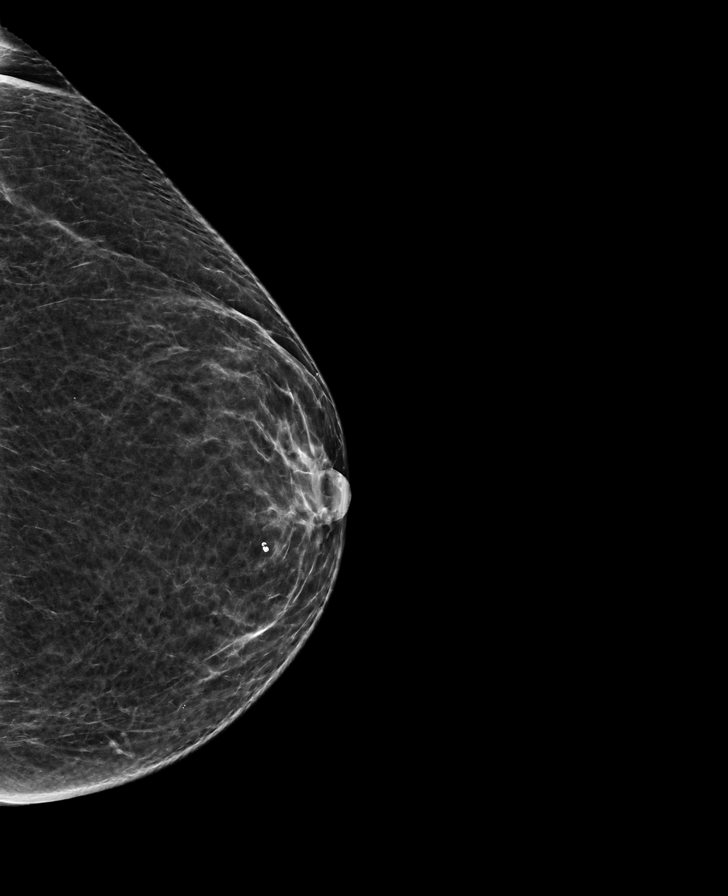

[R CC]
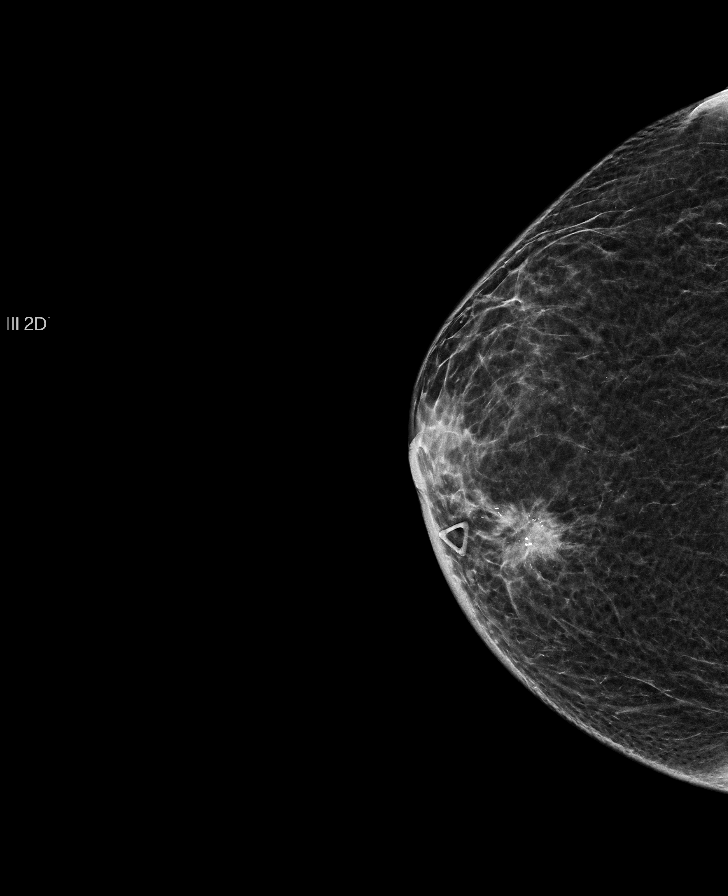

[R MLO tomo · tomo slice 29/58.0]
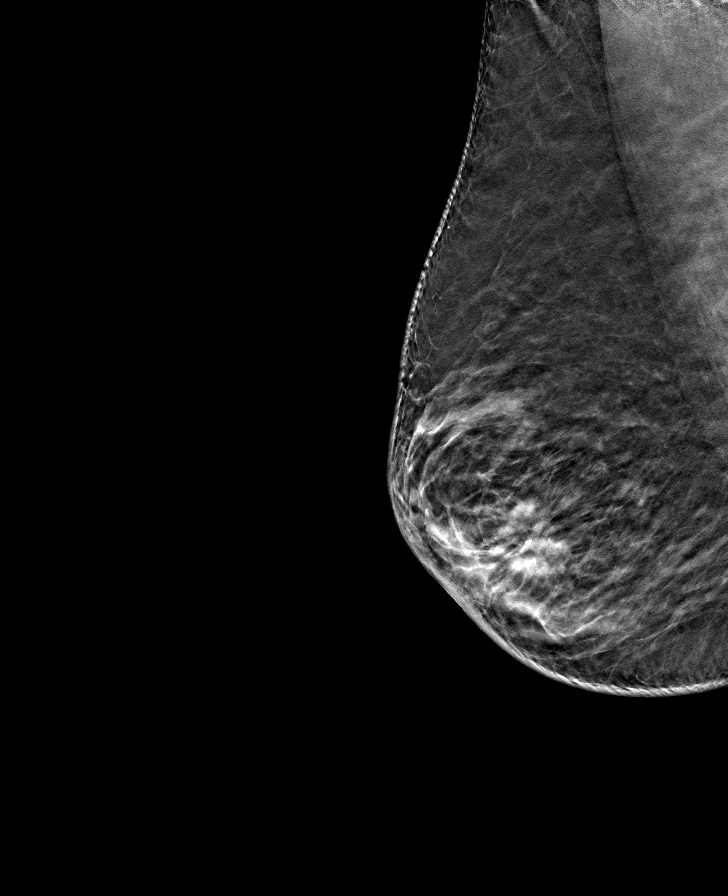

[L CC tomo · tomo slice 29/56.0]
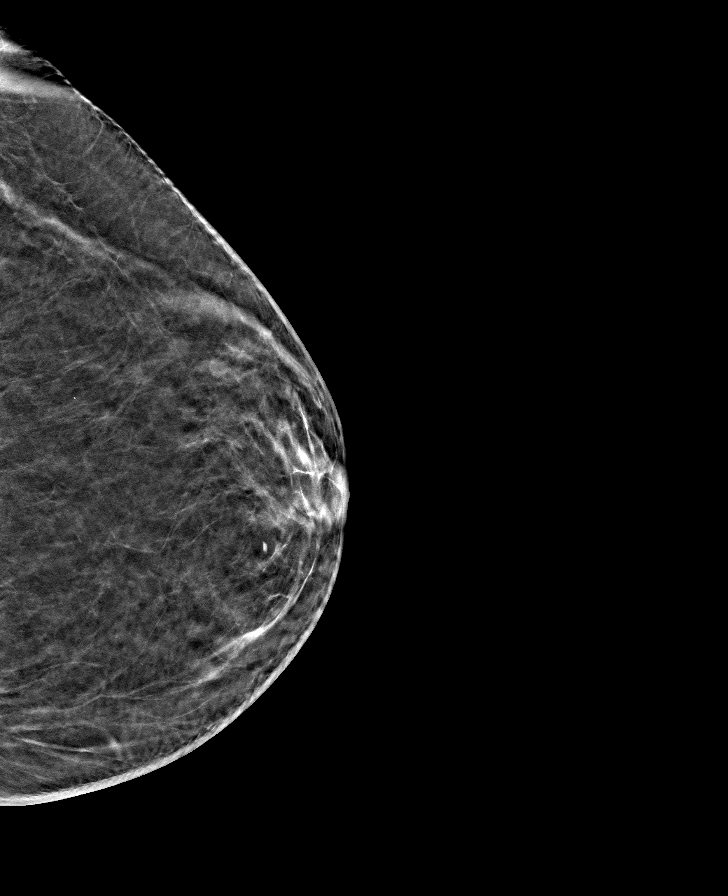

[L MLO tomo · tomo slice 31/62.0]
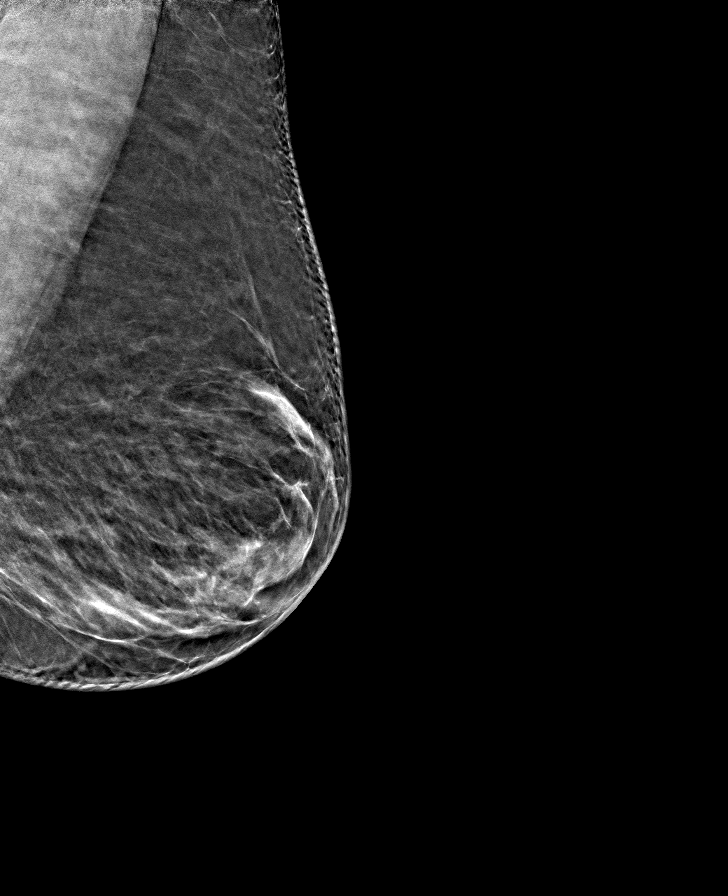

[R CC tomo · tomo slice 25/50.0]
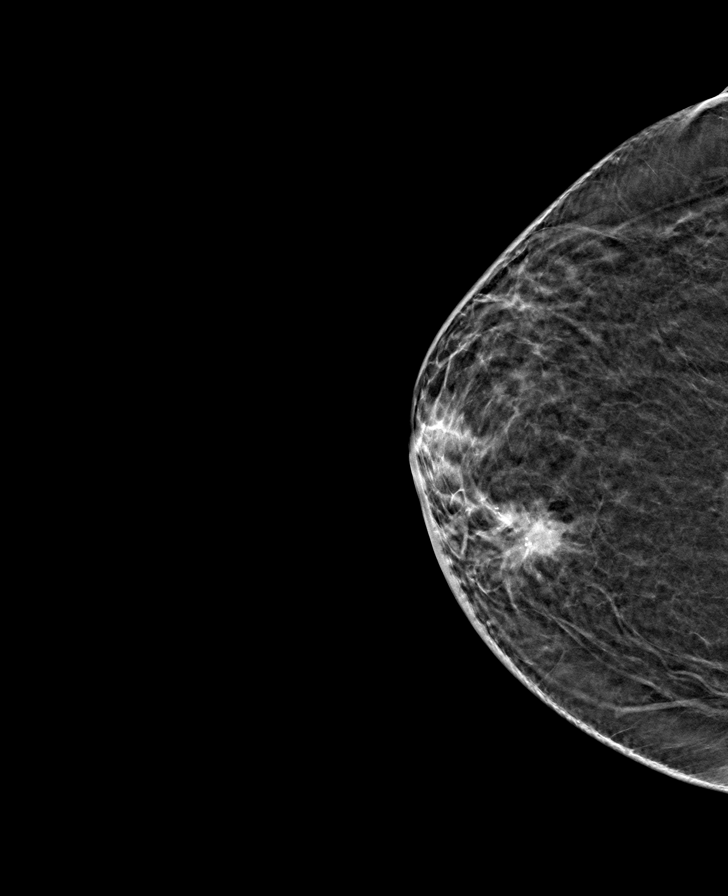

[8 of 24 positions shown; findings below may reference images not displayed]

FINDINGS: Spiculated mass present in the right breast projecting in the 2 o'clock axis 2 
cm from the nipple. There are associated calcifications centrally within the 
mass. Mammographically the mass measures 2 x 1.7 x 2.1 cm (AP by TV by cc). 
There is skin thickening involving the right breast medially as well as some 
trabecular thickening suspicious for spread of neoplasm to the dermal 
lymphatics. 
No suspicious mass, calcifications, or area of architectural distortion are 
identified in the left breast. 
Ultrasound the right breast demonstrates an irregular hypoechoic lesion 
measuring 2 x 1.8 x 1.7 cm. There is a rounded lymph node in the right arm. 
Measuring 11 mm.
IMPRESSION: 1.  Irregular hypoechoic lesion in the right breast at 2 o'clock 2 cm from the 
nipple measuring up to 2 cm is highly suspicious for malignancy. 
2.  Rounded right axillary lymph node measuring 11 mm is suspicious. Biopsy is 
recommended. 
3.  Right breast skin and trabecular thickening which can be seen in the setting 
of neoplastic invasion of the dermal lymphatics. Recommend consideration for 
dermal punch biopsy. 
4.  No mammographic evidence of malignancy in the left breast. 
(BI-RADS 5) Highly suggestive of malignancy. Biopsy is recommended. 
Findings and biopsy recommendation were discussed with the patient time of 
imaging by myself in person. Findings and biopsy recommendation will be called 
to the referring physician's office at the time of diagnostic interpretation.

## 2019-07-08 MED ORDER — EZETIMIBE 10 MG TAB
10 mg | ORAL_TABLET | ORAL | 0 refills | Status: AC
Start: 2019-07-08 — End: ?

## 2019-07-18 IMAGING — US ULTRASOUND GUIDED BREAST BIOPSY
1 series · 7 of 7 positions shown · non-contrast
Comparison: none

ULTRASOUND GUIDED BREAST BIOPSY, 07/18/2019 [DATE]: 
CLINICAL INDICATION: The patient presents for a right breast biopsy located at 
the [DATE] position 2 cm from the nipple. The patient also presents for a right 
axillary lymph node biopsy. 
The entire procedure was explained to the patient in detail. Potential 
complications such as infection, hemorrhage, and the potential need for surgical 
intervention were all discussed. All questions were answered and the patient 
signed her consent form. 
15 cc of 2% lidocaine solution was used for local anesthetic with 5 cc wasted. 
The patient's right breast was prepped and draped in a sterile fashion. 2% 
lidocaine solution was used for local anesthetic. An 18-gauge Rip Krohn biopsy 
gun was inserted under ultrasound guidance into the lesion located at the [DATE] 
position 2 cm from the nipple and an 18-gauge core biopsy was obtained. This was 
repeated 2 more times. A stereotactic clip was then placed in the middle of the 
lesion using ultrasound guidance. 
Attention to the right axilla was then directed. The prominent lymph node was 
identified. 2% lidocaine solution was used for local anesthetic. A new 18-gauge 
Rip Krohn biopsy gun was inserted under ultrasound guidance into the lesion and 
an 18-gauge core biopsy was obtained. This was repeated one more time. The 
pathologist verified the adequacy of all of the samples. There were no acute 
complications.

[Series 14: small part · 7 of 7 slices shown]
[im 1/7]
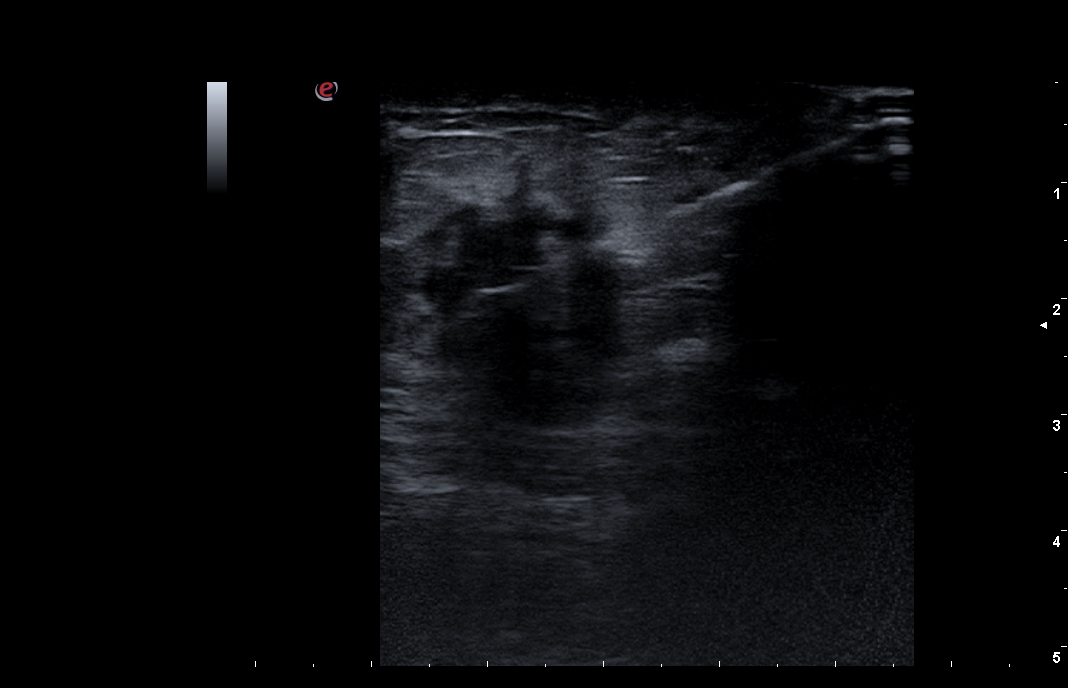
[im 2/7]
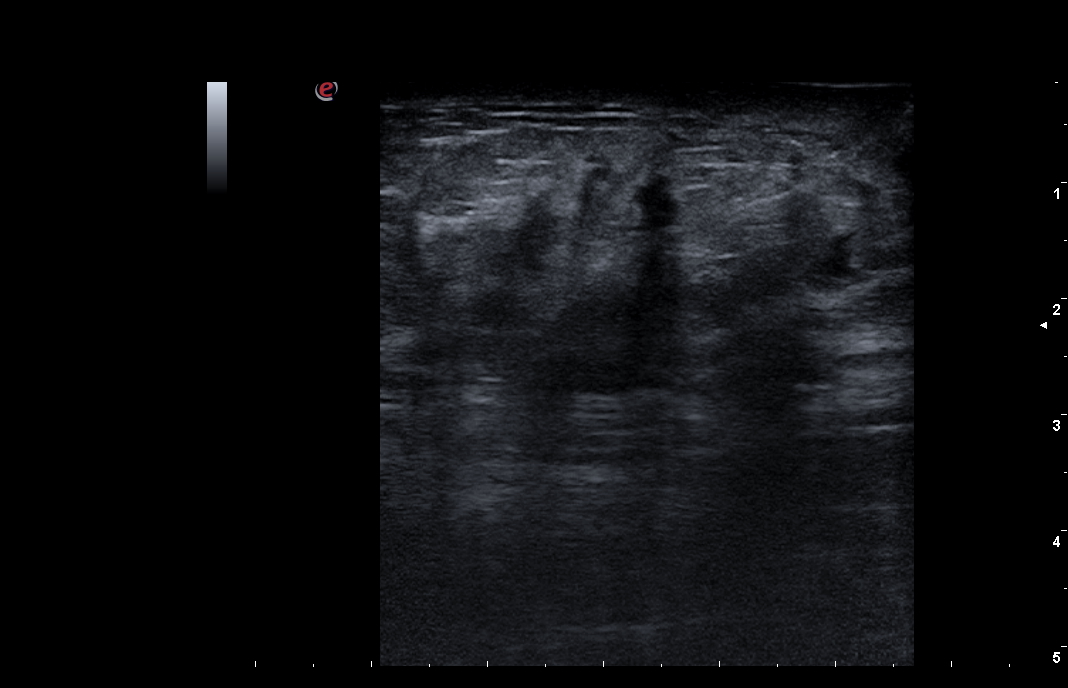
[im 3/7]
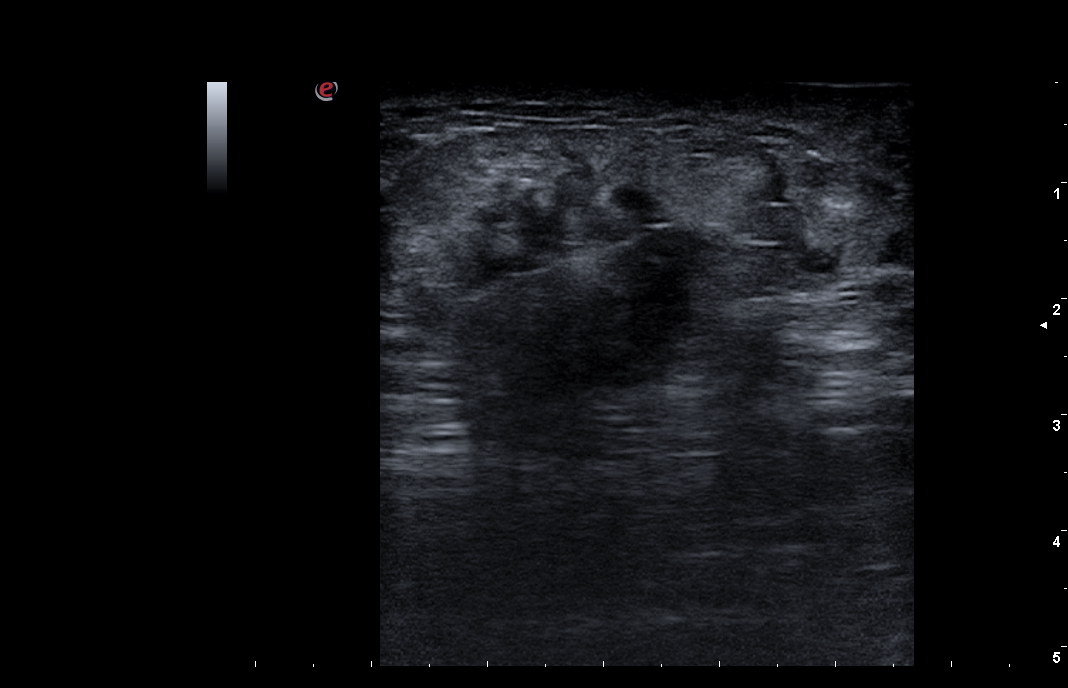
[im 4/7]
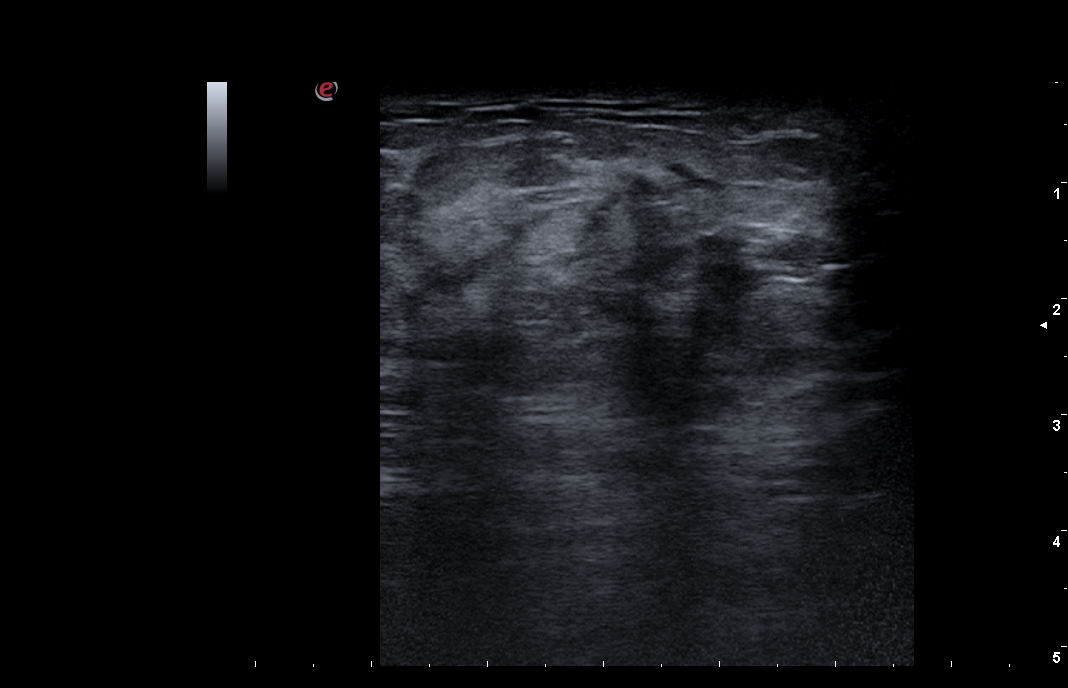
[im 5/7]
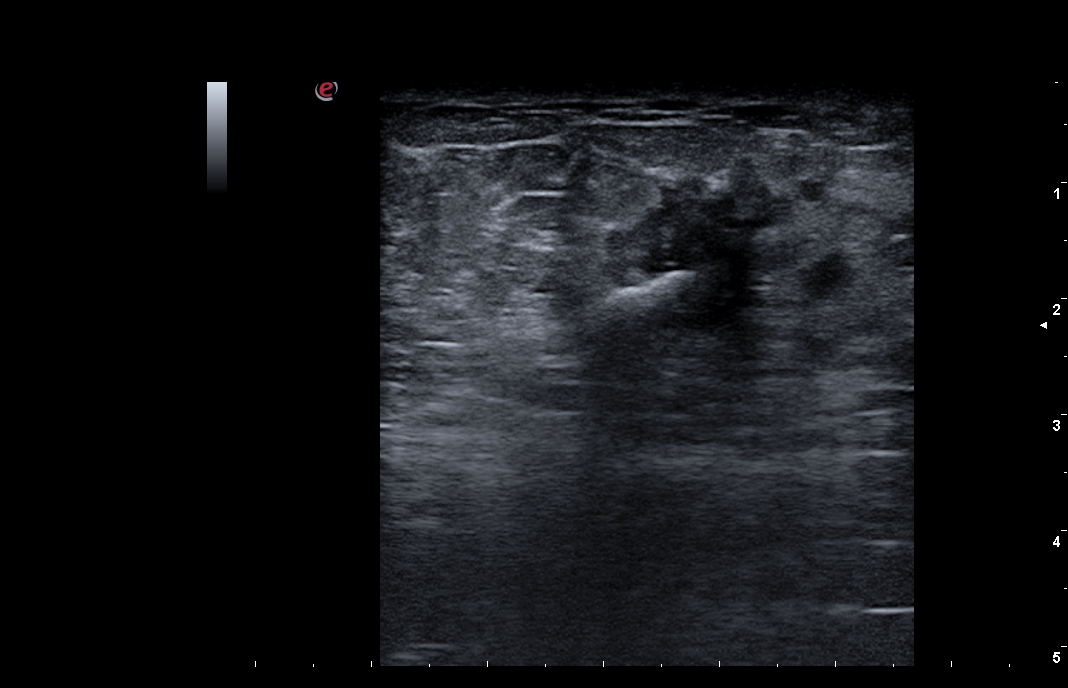
[im 6/7]
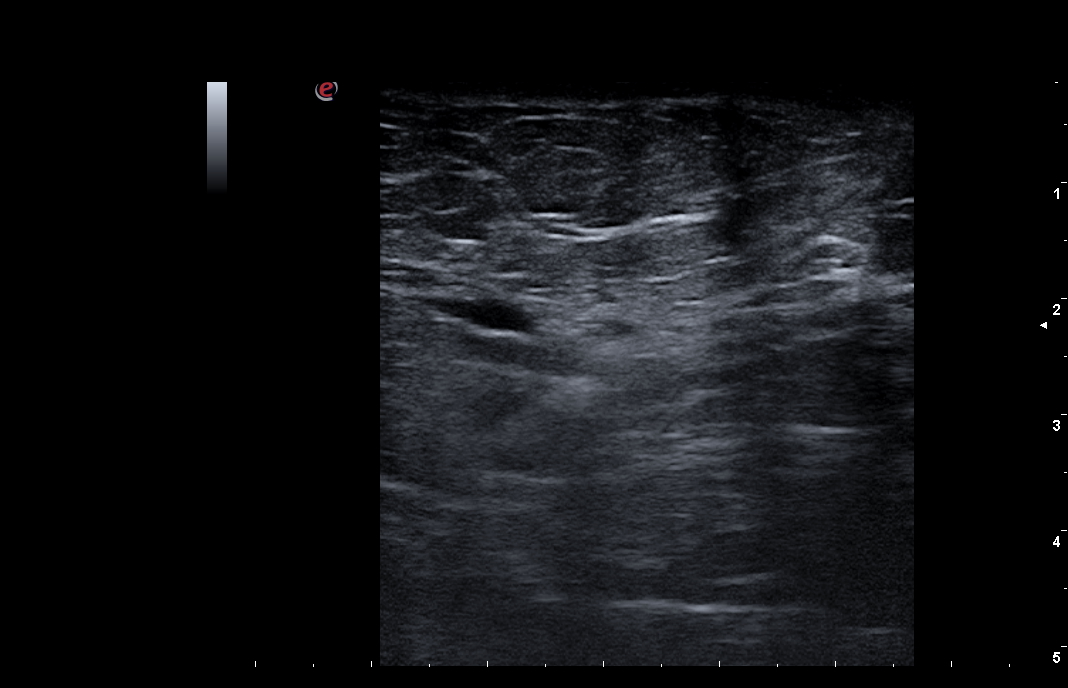
[im 7/7]
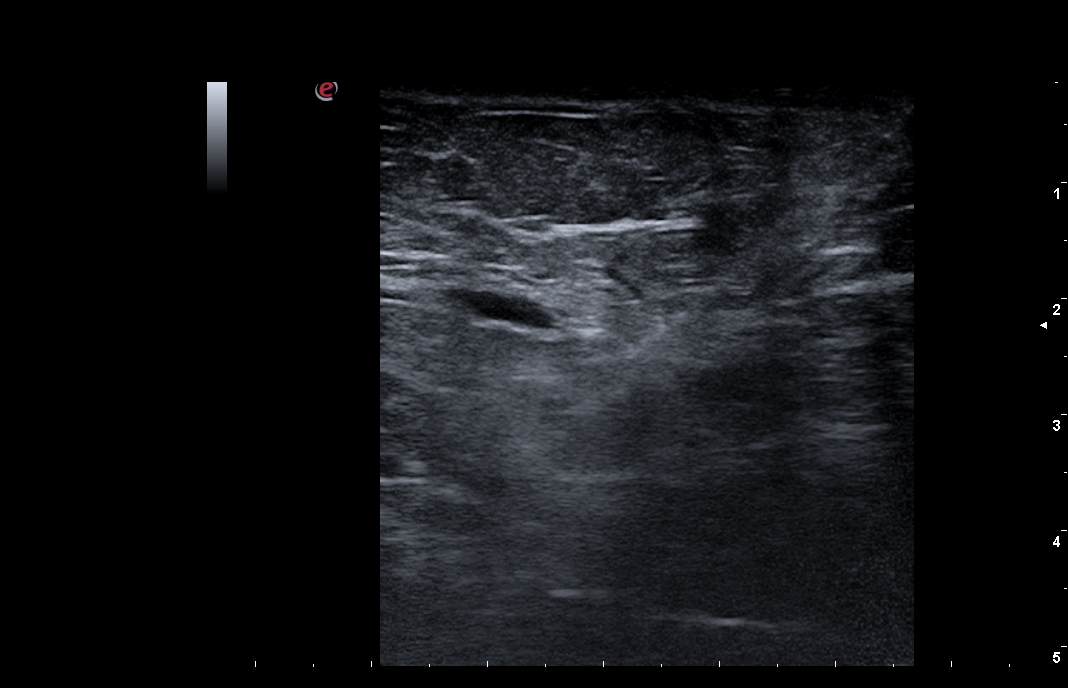

[7 of 7 positions shown; findings below may reference images not displayed]

IMPRESSION: 1. Successful right breast lesion biopsy using ultrasound guidance. 
2. Successful right axillary lymph node biopsy using ultrasound guidance.

## 2019-07-18 IMAGING — MG MAMMOGRAPHY DIAGNOSTIC RIGHT 3D TOMOSYNTHESIS WITH CAD
4 series · 4 of 12 positions shown · non-contrast
Comparison: Comparison was made to prior examinations. 
BREAST DENSITY: (Level B) There are scattered areas of fibroglandular density.

MAMMOGRAPHY DIAGNOSTIC RIGHT 3D TOMOSYNTHESIS WITH CAD, 07/18/2019 [DATE]: 
CLINICAL INDICATION: Status post ultrasound-guided breast biopsy
TECHNIQUE: Unilateral right breast oblique digital mammogram and 3-D 
Tomosynthesis were obtained. In addition, computer-aided detection was utilized.

[R CC]
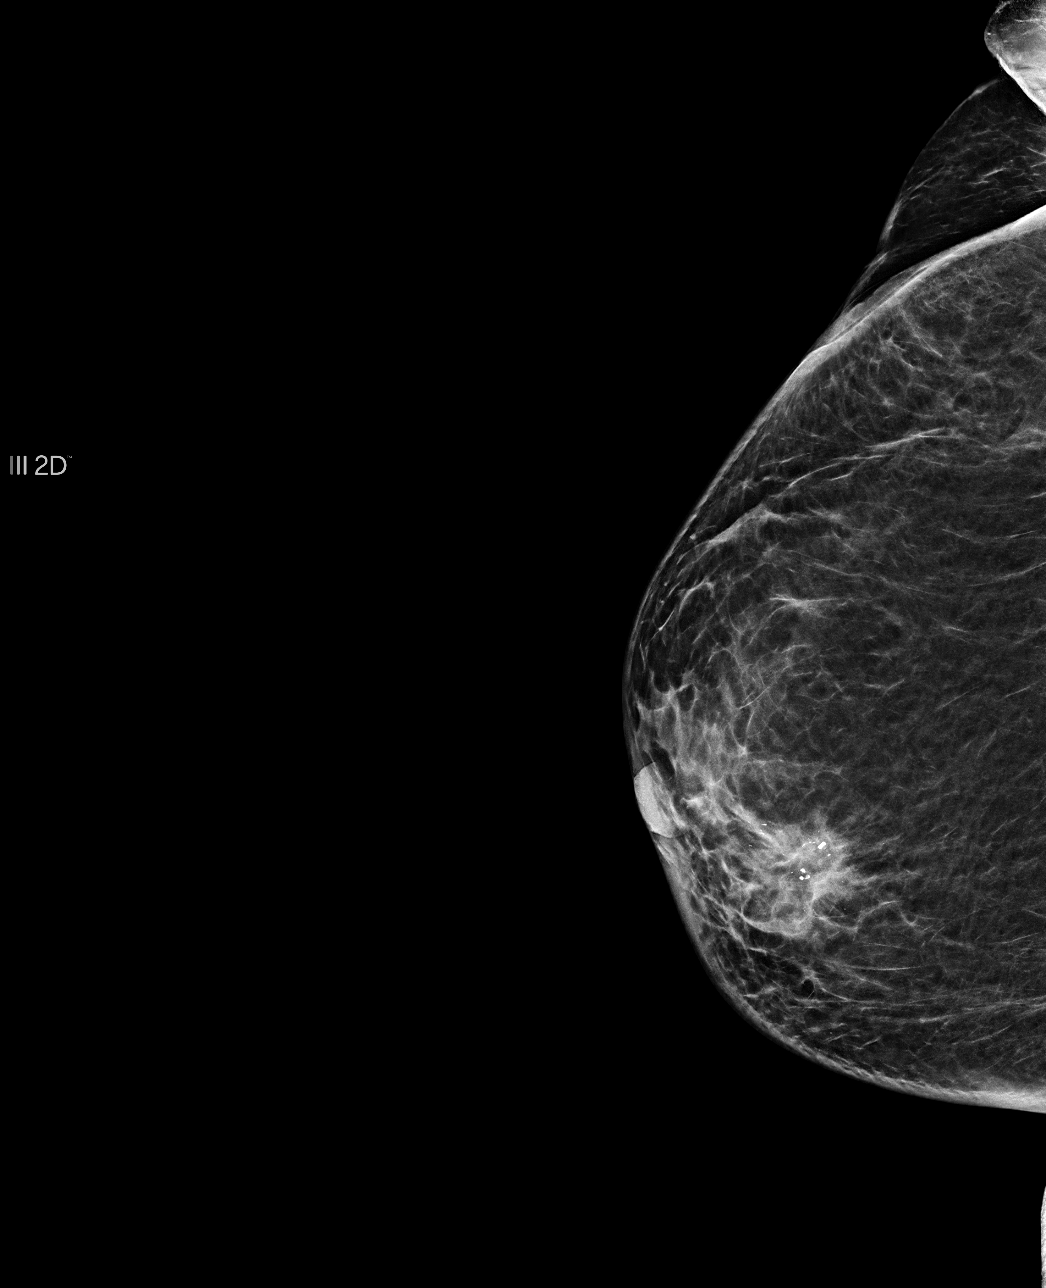

[R ML]
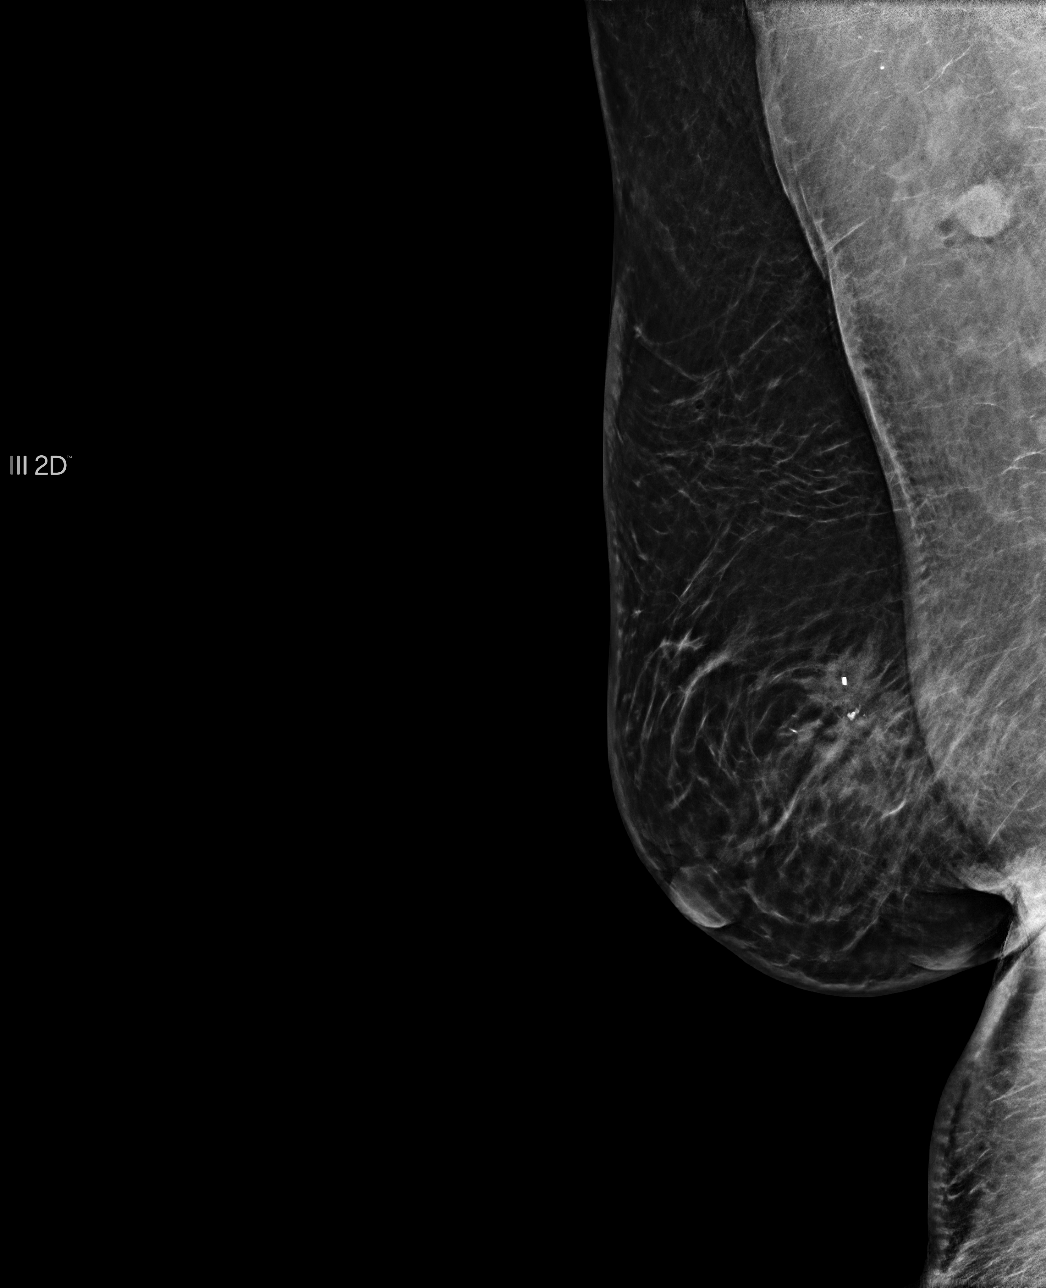

[R ML tomo · tomo slice 47/94.0]
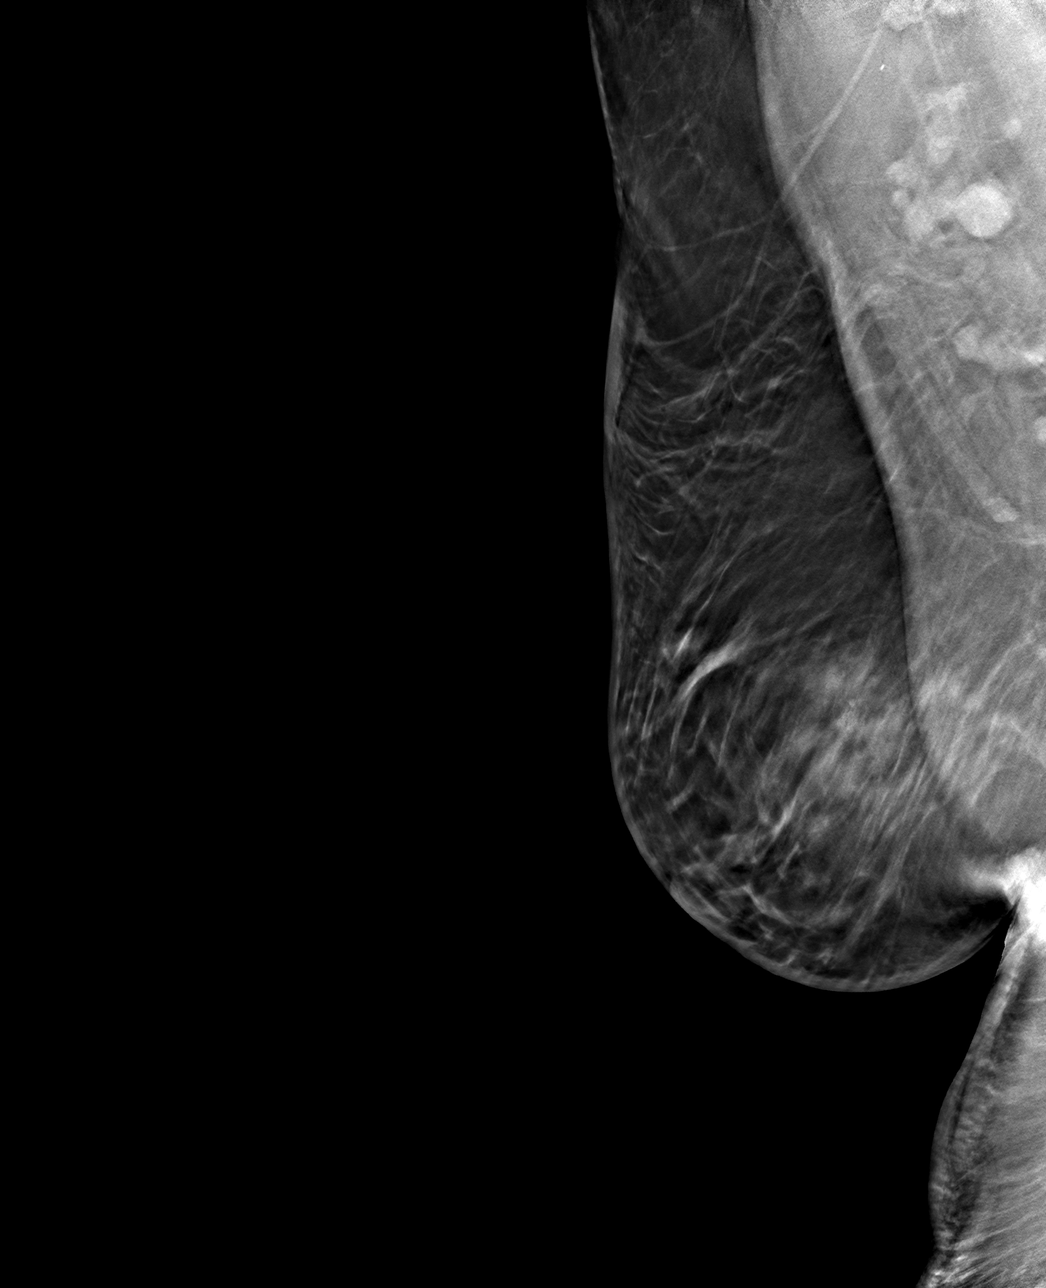

[R CC tomo · tomo slice 33/64.0]
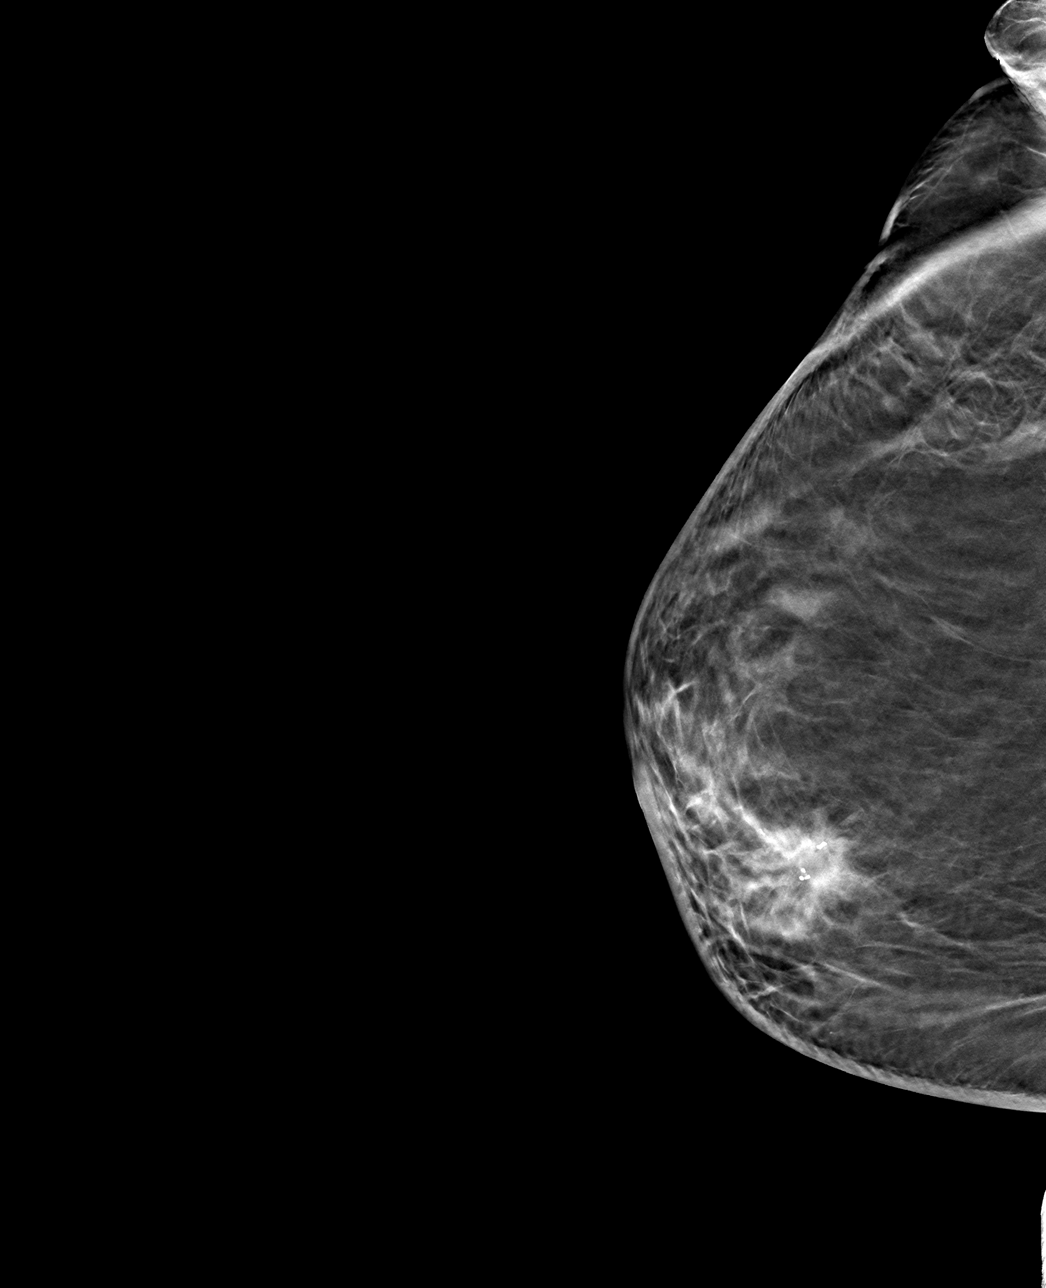

[4 of 12 positions shown; findings below may reference images not displayed]

FINDINGS: There is a post procedure clip well positioned within the mass 
involving the upper inner quadrant of the right breast.
IMPRESSION: Satisfactory placement of a post procedure clip within the mass in the upper 
inner quadrant of the right breast. No significant migration.

## 2019-07-18 IMAGING — US LYMPH NODE BIOPSY
1 series · 2 of 2 positions shown · non-contrast
Comparison: none

LYMPH NODE BIOPSY, 07/18/2019 [DATE]: 
CLINICAL INDICATION: Enlarged right axilla 
PROCEDURE: Following discussion of the procedure with patient cleaning the 
possibilities of bleeding, infection, local tissue damage, and failure to 
acquire a diagnostic biopsy and obtaining informed consent, the patient's right 
axilla was prepped and draped in usual sterile manner. Following the 
administration of local anesthesia, access to the largest of the right axillar 
lymph nodes was obtained with an 18-gauge core biopsy needle. 2 core biopsy 
samples were obtained and were sent for pathologic assessment. The patient 
tolerated the procedure well. No complications were experienced.

[Series 2: vascular · 2 of 2 slices shown]
[im 1/2]
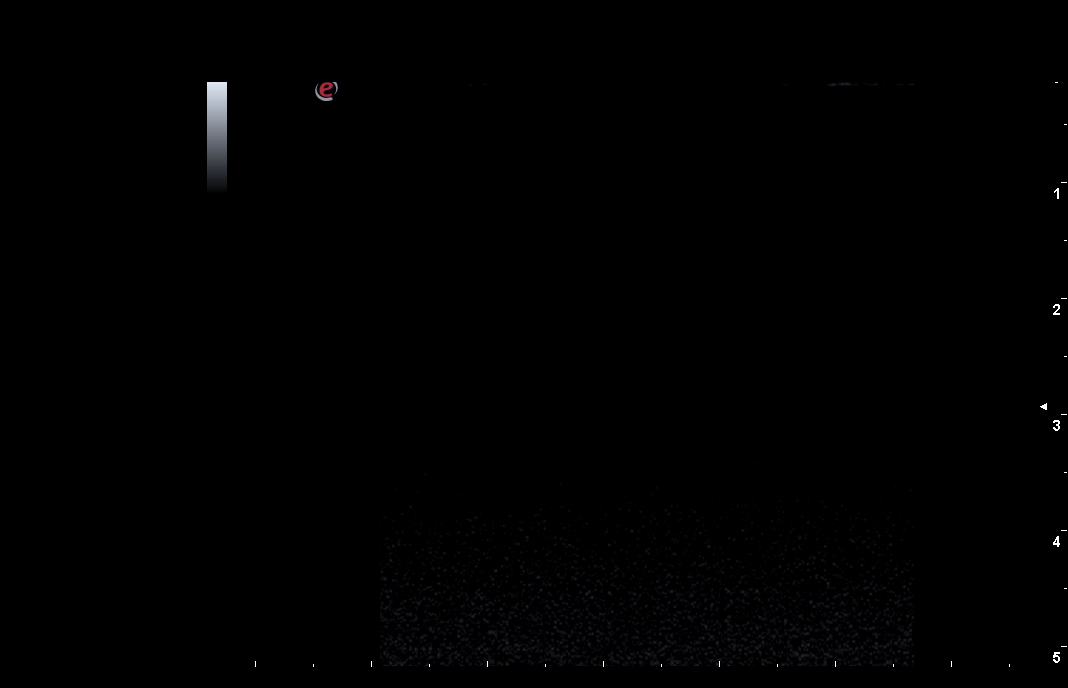
[im 2/2]
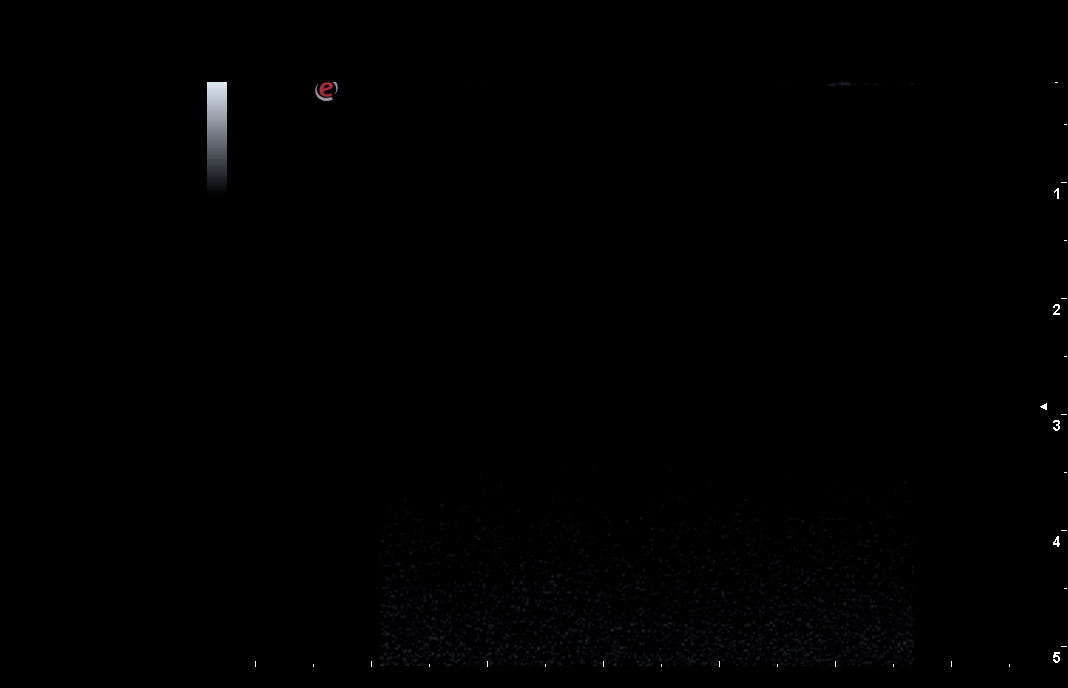

[2 of 2 positions shown; findings below may reference images not displayed]

IMPRESSION: Right axillar lymph node biopsy performed under direct ultrasound visualization 
without complications as described above.

## 2019-07-23 ENCOUNTER — Encounter

## 2019-07-25 MED ORDER — LOSARTAN 25 MG TAB
25 mg | ORAL_TABLET | ORAL | 0 refills | Status: AC
Start: 2019-07-25 — End: ?

## 2019-07-25 NOTE — Telephone Encounter (Signed)
pls have patient make appt to see me or will need new physician if not returning soon

## 2019-08-01 IMAGING — PT PET CT SCAN TUMOR IMAGING SKULL TO THIGH
3 series · 25 of 25 positions shown · non-contrast
Comparison: None

PET CT SCAN TUMOR IMAGING SKULL TO THIGH, 08/01/2019 [DATE]: 
CLINICAL INDICATION:  Right breast and right axillary lymph node biopsy 
07/18/2019 showed invasive mammary carcinoma. Staging. Previous smoker.
TECHNIQUE: A dose of 14.2 millicuries of 18-FDG was administered intravenously 
and skull to thigh PET scanning was performed at 75 minutes. Tomographic scans 
were reconstructed in axial, coronal, and sagittal projections. The data was 
reconstructed into a three-dimensional volume rendered image and reviewed in a 
rotational cine loop. Serum blood glucose at the time of injection was 99 mg/dl.

[Series 2: body-low dose ct · axial · 5.0mm · 1.17mm/px · z∈[+341,+1281]mm · 7 of 189 slices shown]
[im 1/189]
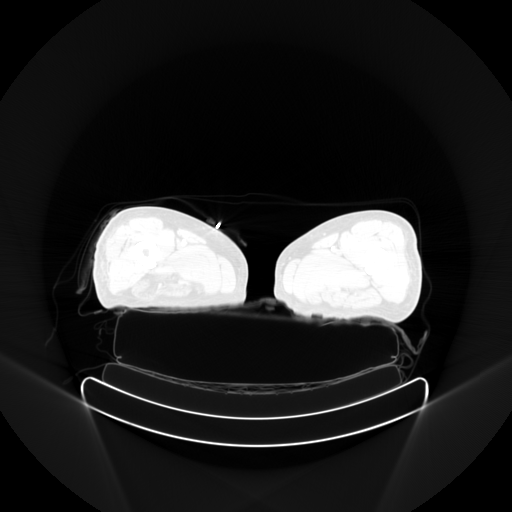
[im 32/189]
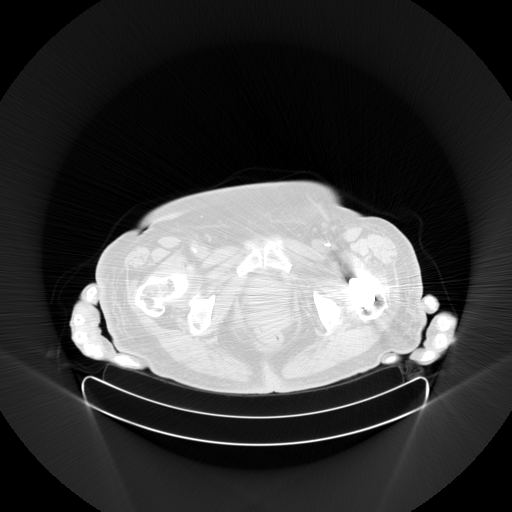
[im 63/189]
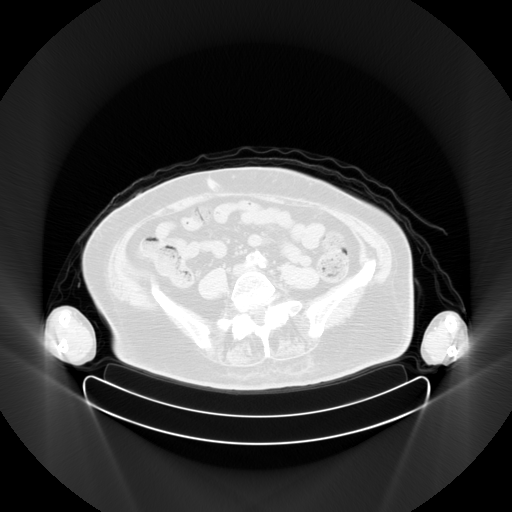
[im 95/189]
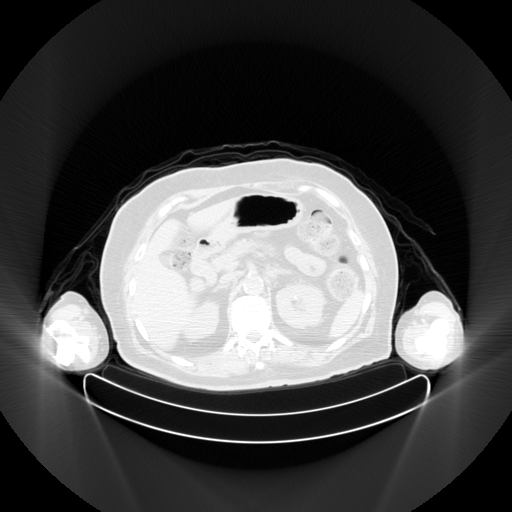
[im 126/189]
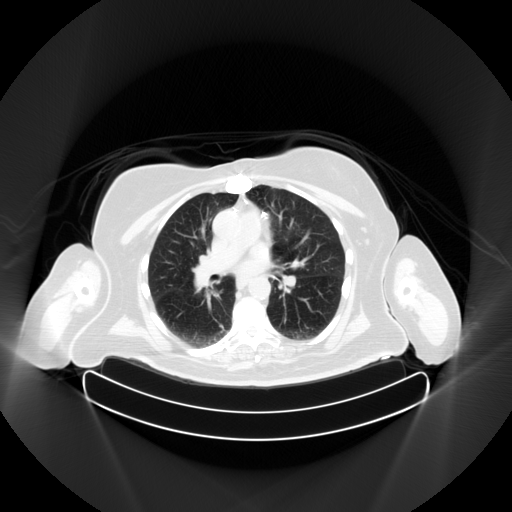
[im 157/189]
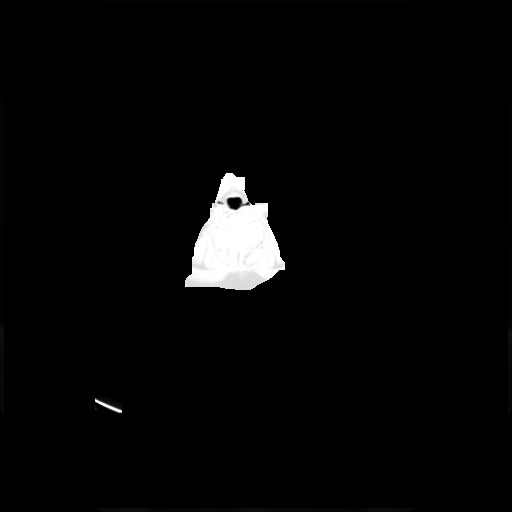
[im 189/189]
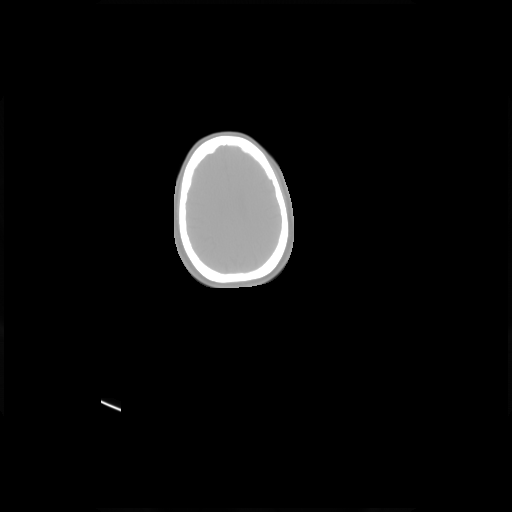

[(id)_[email or web address]: (id) · axial · 4.0mm · 4.00mm/px · z∈[+347,+1279]mm · 9 of 234 slices shown]
[im 1/234]
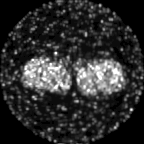
[im 30/234]
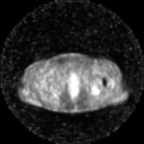
[im 59/234]
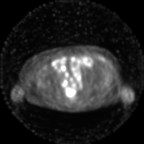
[im 88/234]
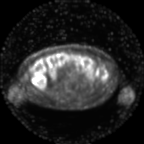
[im 117/234]
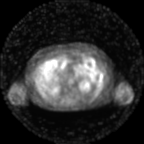
[im 146/234]
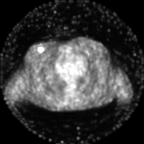
[im 175/234]
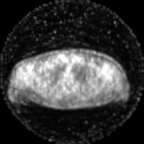
[im 204/234]
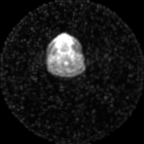
[im 234/234]
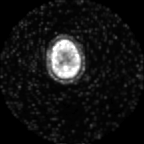

[(id)_wb_nac.img: (id) · axial · 4.0mm · 4.00mm/px · z∈[+347,+1279]mm · 9 of 234 slices shown]
[im 1/234]
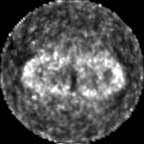
[im 30/234]
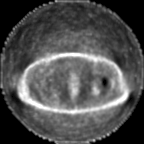
[im 59/234]
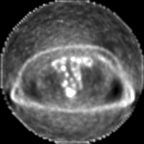
[im 88/234]
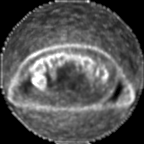
[im 117/234]
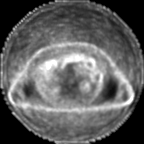
[im 146/234]
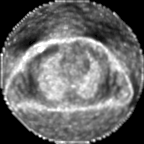
[im 175/234]
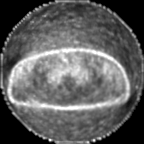
[im 204/234]
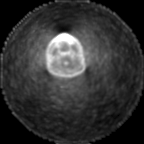
[im 234/234]
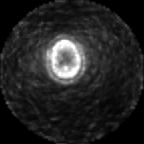

[25 of 25 positions shown; findings below may reference images not displayed]

FINDINGS: The low-dose CT shows postop changes, atherosclerotic changes and 
degenerative changes. There is a partially calcified mass within the medial 
aspect of the right breast. Within the right axilla deep to the pectoralis major 
but not minor muscle on image 63 is a 9 x 9 mm lymph node. More superiorly just 
below the level of the clavicle are 2 lymph nodes seen deep to the pectoralis 
minor muscle the larger 15 x 6 mm on image 52. No lung lesions. Small lipoma 
seen within the left adrenal gland. Atherosclerotic change and degenerative 
changes. Postop changes with previous left hip repair. There is a large seroma 
suspected within the soft tissues anterior to the left hip measuring upwards of 
8.6 cm in diameter on axial image 150. 
The PET scan images show abnormal activity within the left sternocleidomastoid 
mastoid muscle. Not typical physiologic activity rather focal and elevated at 
maximum SUV of 4.6. I do not see a CT correlate on this noncontrast study. This 
would better evaluated with MRI to determine if is a muscle abnormality or true 
muscular metastasis to the base the neck on the left. There is activity seen 
corresponding to the primary malignancy within the right breast maximum SUV of 
5.5. The lymph node described above deep to the pectoralis major muscle the 
lateral to the pectoralis minor muscle has a maximum SUV of 2.2. The lymph nodes 
more superiorly deep to the pectoralis minor muscle has a maximum SUV of 1.9. No 
additional lesions are seen within the chest. No abnormal activity seen within 
the lungs. Physiologic activity thought to be present below the diaphragm. 
Diffuse activity in the colon makes evaluation somewhat difficult though this 
appears physiologic in nature. No abnormal activity corresponding to the 
suspected seroma anterior to the left hip. No osseous lesion seen.
IMPRESSION: Primary malignancy of the right breast with adenopathy seen level 1 and level 2. 
Activity also within the left sternocleidomastoid mastoid muscle somewhat 
atypical. Consider MRI the neck with and without contrast to determine if this 
is a metastatic focus. 
No suspicious lung lesions or osseous lesions. No suspicious activity below the 
diaphragm. 
Postop changes with large seroma seen overlying the left groin. 
Atherosclerotic changes and degenerative changes.

## 2019-08-05 ENCOUNTER — Encounter

## 2019-08-06 MED ORDER — NABUMETONE 500 MG TAB
500 mg | ORAL_TABLET | ORAL | 0 refills | Status: AC
Start: 2019-08-06 — End: ?

## 2019-08-10 IMAGING — MR MRI ORBITS FACE OR NECK  W/WO CONTRAST
13 of 14 series · 42 of 48 positions shown · non-contrast
Comparison: PET exam 08/01/2019.

MRI ORBITS FACE OR NECK  W/WO CONTRAST, 08/10/2019 [DATE]: 
CLINICAL INDICATION:  Recently diagnosed RIGHT breast malignancy. On a staging 
PET examination FDG activity was in the distal LEFT sternocleidomastoid muscle 
and MR imaging was recommended for further evaluation. Patient states on today's 
examination than they had recent LEFT carotid artery surgery approximately six 
weeks ago and that there is a palpable lump at the incision site which also 
marks the site of the abnormal activity on the recent PET examination. This 
history was not available for the radiologist who interpreted the PET exam.
TECHNIQUE: Multiplanar, multiecho position MR images of the neck were performed 
with and without intravenous contrast enhancement.  8.5 cc Gadovist injected 
intravenously.

[Series 201: survey · axial · 10.0mm · 0.94mm/px · 1 of 9 slices shown]
[im 1/9]
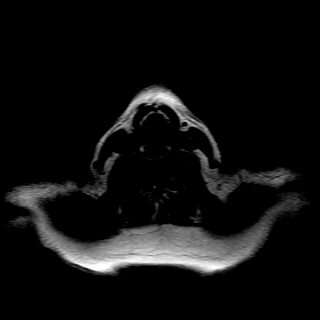

[Series 602: (person_name)1(person_name)_(person_name)/(person_name)_(person_name)_ap · axial · 4.5mm · 0.69mm/px · z∈[-195,-3]mm · 4 of 36 slices shown]
[im 1/36]
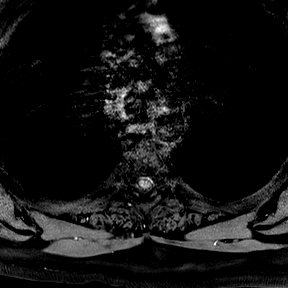
[im 12/36]
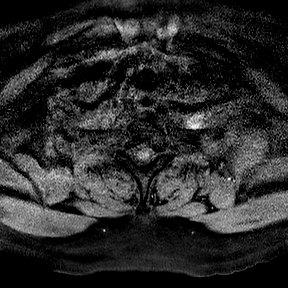
[im 24/36]
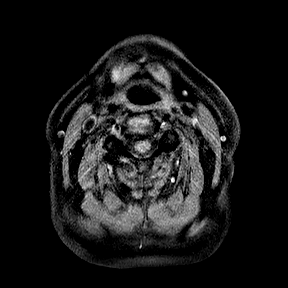
[im 36/36]
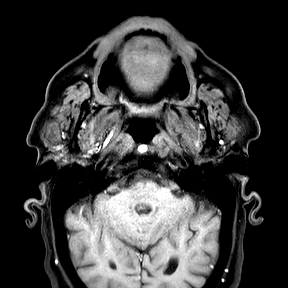

[Series 603: (person_name)1(person_name)_(person_name)_ap · axial · 4.5mm · 0.69mm/px · z∈[-195,-3]mm · 4 of 36 slices shown]
[im 1/36]
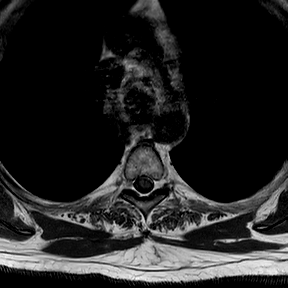
[im 12/36]
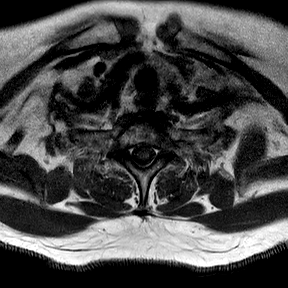
[im 24/36]
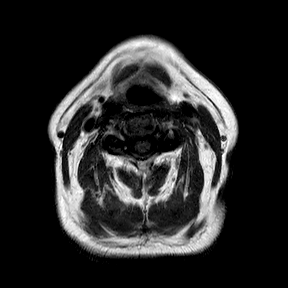
[im 36/36]
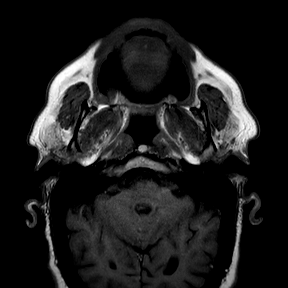

[Series 702: st2w fs mdixon_tse_rl · axial · 4.5mm · 0.60mm/px · z∈[-195,-3]mm · 4 of 36 slices shown (1 of 2)]
[im 1/36]
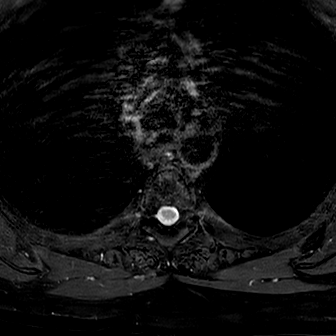
[im 12/36]
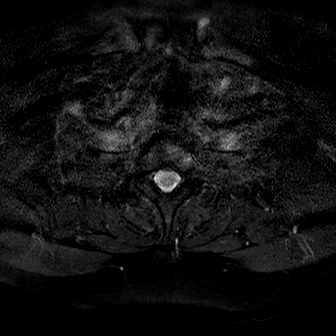
[im 24/36]
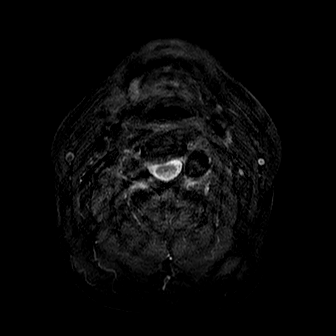
[im 36/36]
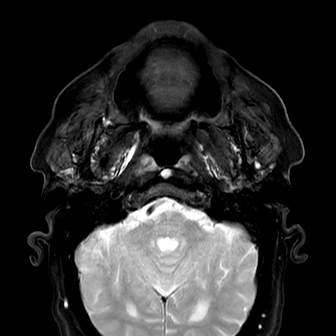

[Series 703: st2w mdixon_tse_rl · axial · 4.5mm · 0.60mm/px · z∈[-195,-3]mm · 4 of 36 slices shown (1 of 2)]
[im 1/36]
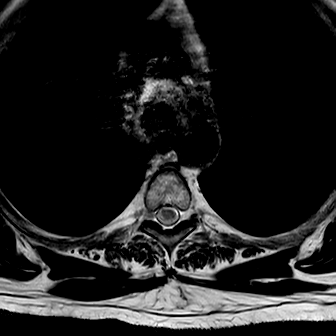
[im 12/36]
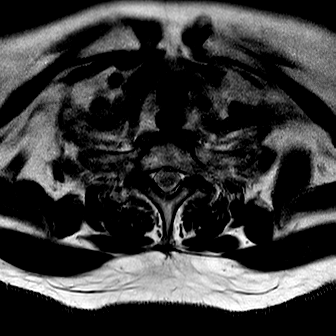
[im 24/36]
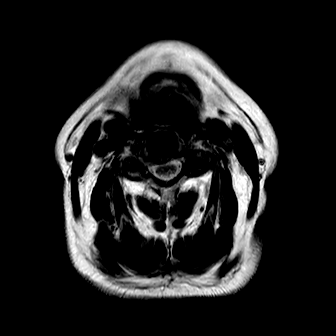
[im 36/36]
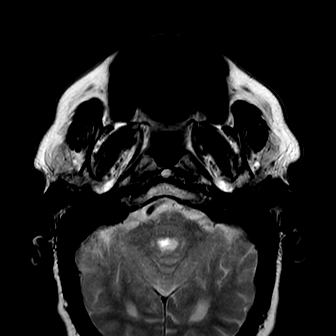

[Series 802: (id) (person_name)_(person_name)_(person_name) · coronal · 3.5mm · 0.65mm/px · 3 of 32 slices shown (1 of 2)]
[im 1/32]
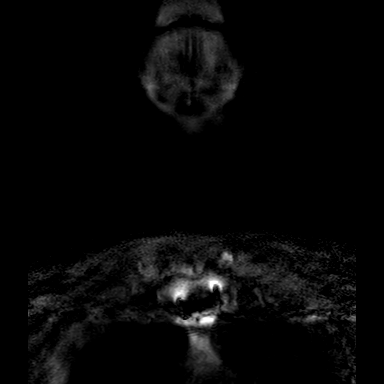
[im 16/32]
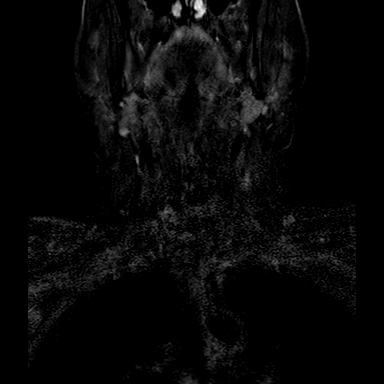
[im 32/32]
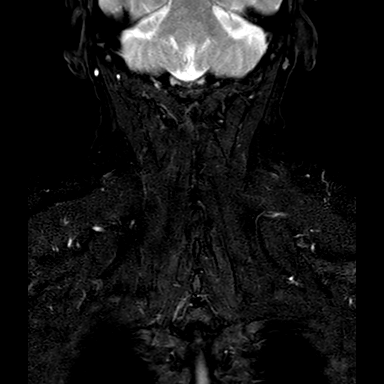

[Series 803: (id) (person_name)_(person_name)_(person_name) · coronal · 3.5mm · 0.65mm/px · 3 of 32 slices shown (2 of 2)]
[im 1/32]
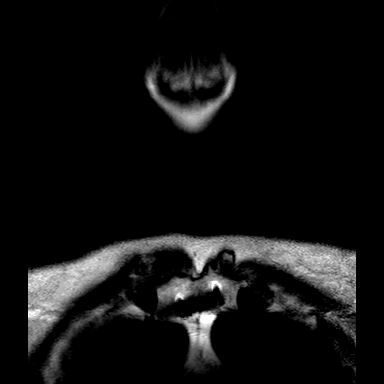
[im 16/32]
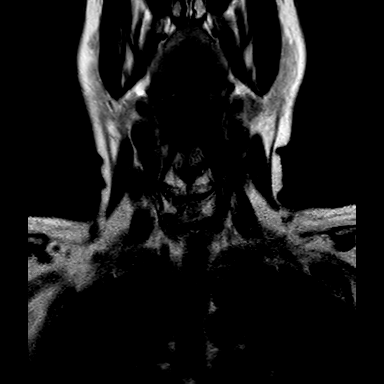
[im 32/32]
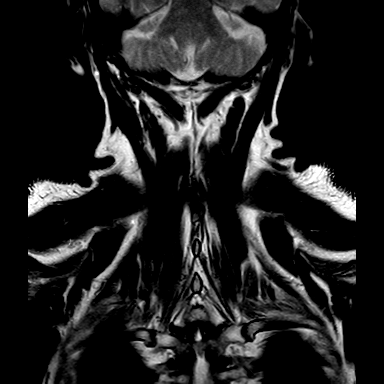

[Series 901: t2w_mvxd_sag · sagittal · 4.0mm · 0.49mm/px · 3 of 32 slices shown]
[im 1/32]
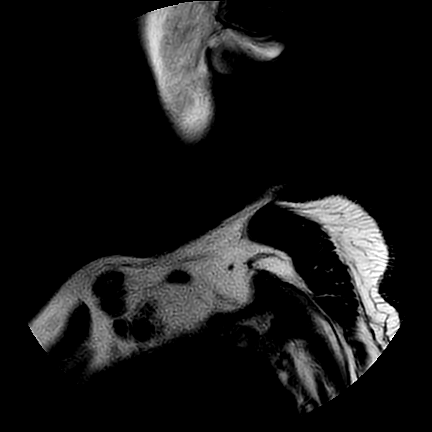
[im 16/32]
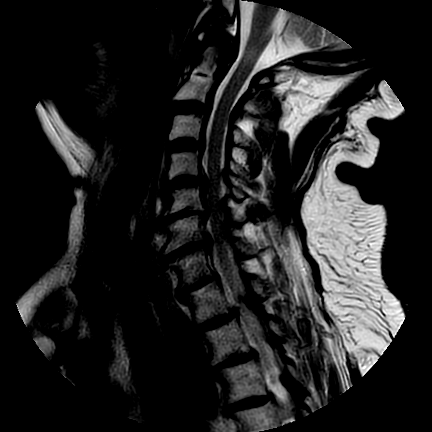
[im 32/32]
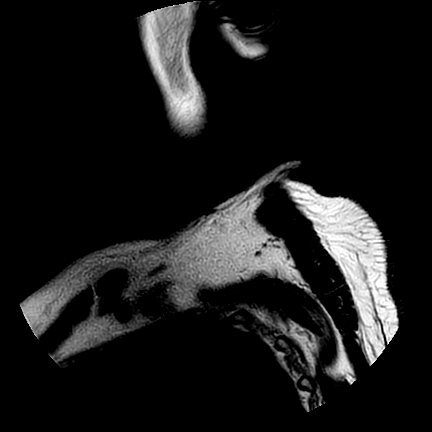

[Series 1002: st2w fs mdixon_tse_rl · axial · 4.5mm · 0.60mm/px · z∈[-195,-3]mm · 4 of 36 slices shown (2 of 2)]
[im 1/36]
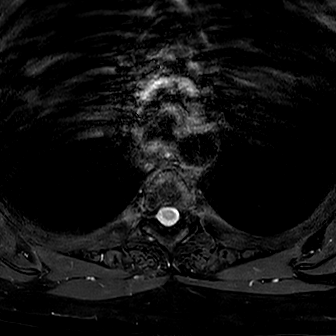
[im 12/36]
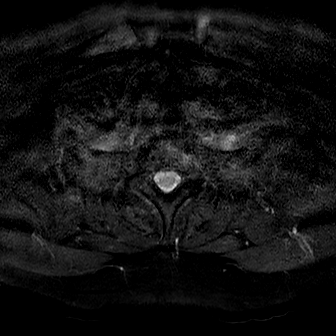
[im 24/36]
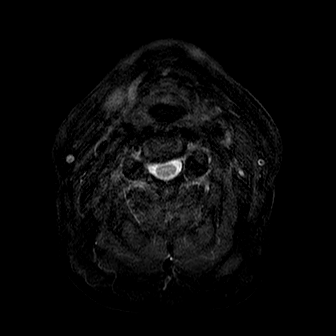
[im 36/36]
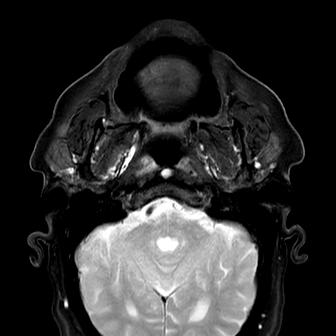

[Series 1003: st2w mdixon_tse_rl · axial · 4.5mm · 0.60mm/px · z∈[-195,-3]mm · 4 of 36 slices shown (2 of 2)]
[im 1/36]
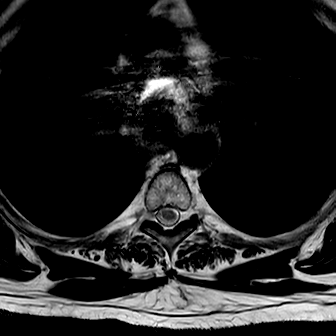
[im 12/36]
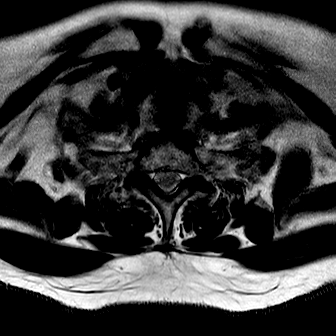
[im 24/36]
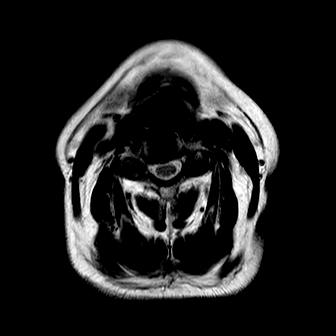
[im 36/36]
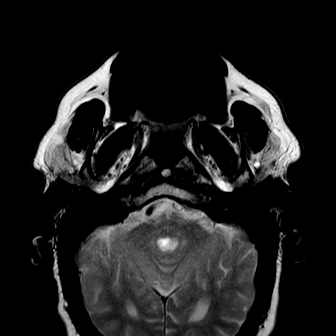

[Series 1102: sgad t1w fs · axial · 4.5mm · 0.69mm/px · z∈[-171,-12]mm · 3 of 30 slices shown]
[im 1/30]
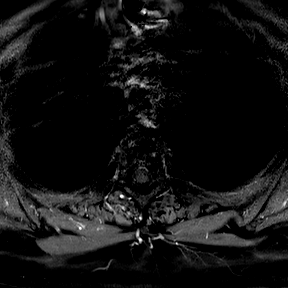
[im 15/30]
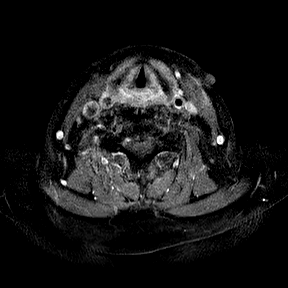
[im 30/30]
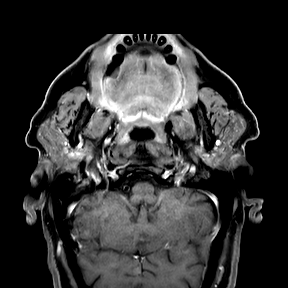

[Series 1103: sgad t1w mdixon_tse_ap · axial · 4.5mm · 0.69mm/px · z∈[-171,-12]mm · 3 of 30 slices shown]
[im 1/30]
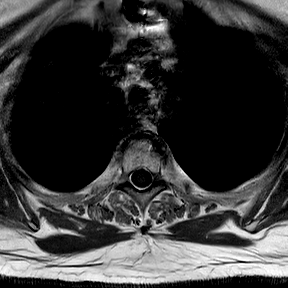
[im 15/30]
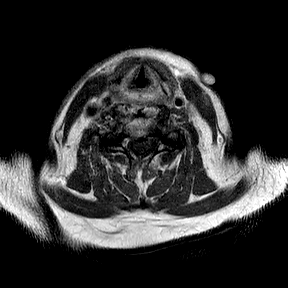
[im 30/30]
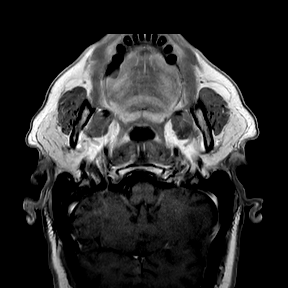

[Series 1202: sgad (person_name)1(person_name)_(person_name)_(person_name)_(person_name) · coronal · 3.5mm · 0.60mm/px · 2 of 35 slices shown]
[im 1/35]
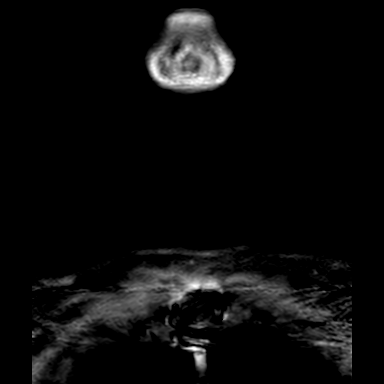
[im 12/35]
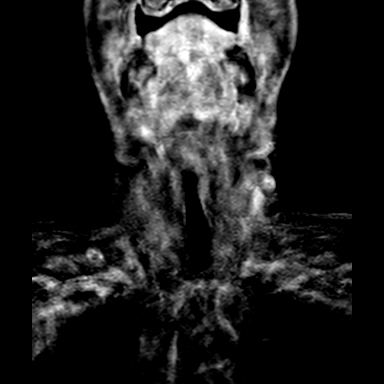

[42 of 48 positions shown; findings below may reference images not displayed]

FINDINGS: Unfortunately there is relatively severe motion artifact in the area of 
interest. Ill-defined enhancement and edema is in the subcutaneous tissues 
overlying the distal LEFT sternocleidomastoid muscle and also within the muscle 
itself. 
Incidental small Tornwaldt cyst noted in the nasopharynx. Degenerative changes 
incidentally noted in the cervical spine.
IMPRESSION: Given the history of LEFT carotid surgery six weeks ago and given that the 
patient states that there is a palpable lump at the incision site in the LEFT 
neck which appears to correspond to the area of activity on PET imaging it is 
fairly certain that this is benign postsurgical activity. Even though this 
examination is severely degraded by motion it still shows that the area of 
interest is a diffuse area of edema and enhancement in the distal LEFT 
sternocleidomastoid muscle and overlying subcutaneous soft tissues which would 
be consistent with this. I do not recommend further imaging of this area.

## 2019-08-20 MED ORDER — VARENICLINE 1 MG TAB
1 mg | ORAL_TABLET | ORAL | 3 refills | Status: AC
Start: 2019-08-20 — End: ?

## 2019-08-22 ENCOUNTER — Encounter

## 2019-08-25 ENCOUNTER — Encounter

## 2019-09-06 ENCOUNTER — Encounter

## 2019-10-01 ENCOUNTER — Encounter

## 2019-10-21 ENCOUNTER — Encounter

## 2019-10-22 ENCOUNTER — Encounter

## 2019-11-10 IMAGING — MR MRI BRAIN W/WO CONTRAST
4 of 11 series · 17 of 48 positions shown · IV contrast (gadavist)
Comparison: MRI examination of the orbit of 08/10/2019. PET CT exam of 08/01/2019.

MRI BRAIN W/WO CONTRAST, 11/10/2019 [DATE]: 
CLINICAL INDICATION: History of right breast cancer treated with chemotherapy. 
Chronic headaches.
TECHNIQUE: Multiplanar, multiecho position MR images of the brain were performed 
without and with 8.5 cc of Gadavist intravenous gadolinium enhancement.

[Series 101: survey · axial · 10.0mm · 0.94mm/px · 1 of 5 slices shown]
[im 1/5]
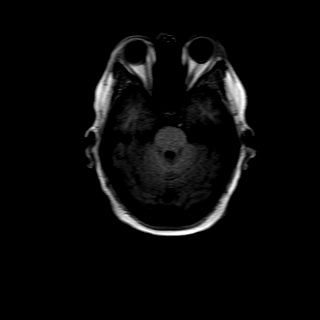

[Series 601: SWI · axial · 3.0mm · 0.40mm/px · z∈[-56,+78]mm · 9 of 200 slices shown]
[im 10/200]
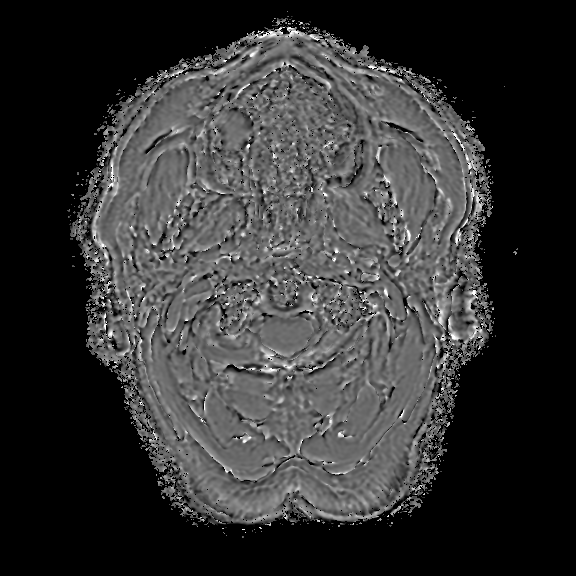
[im 30/200]
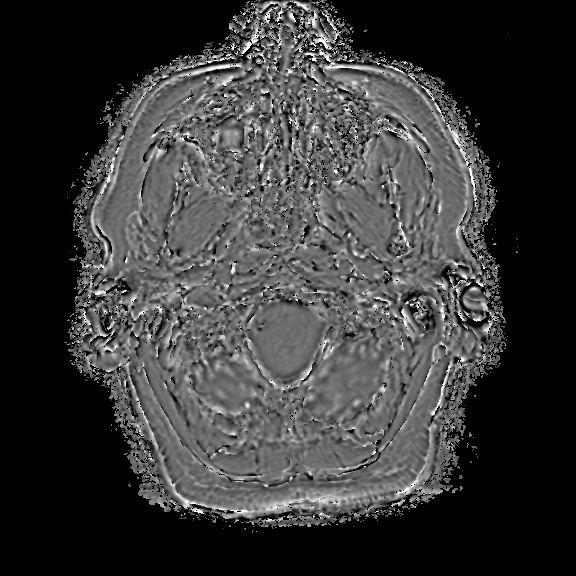
[im 60/200]
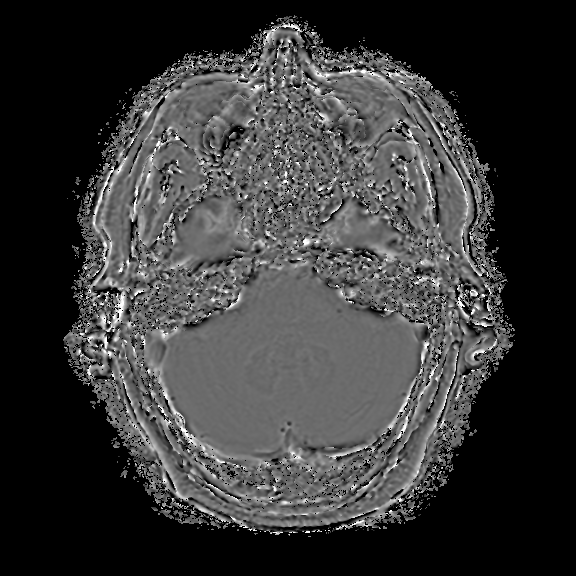
[im 90/200]
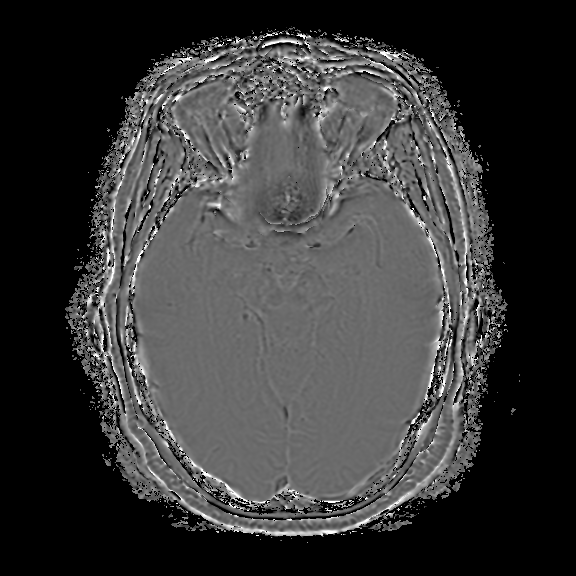
[im 100/200]
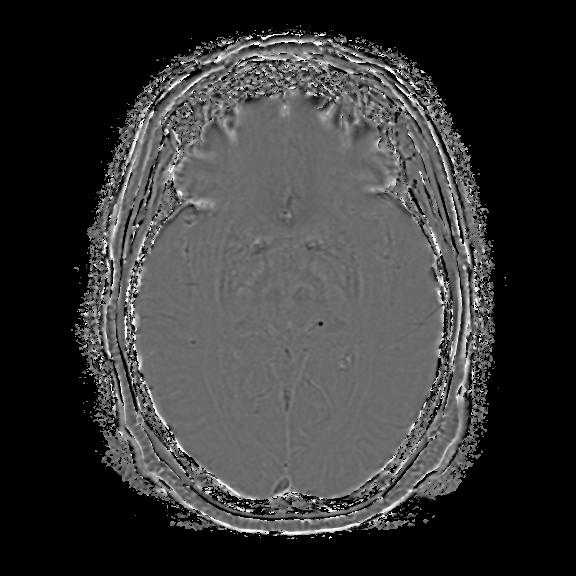
[im 110/200]
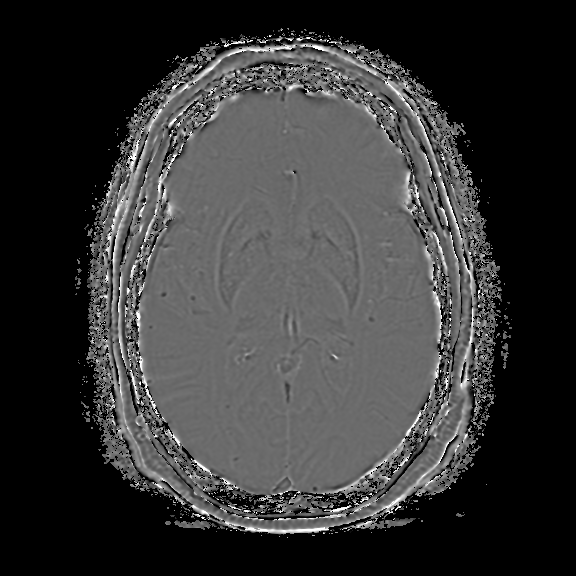
[im 140/200]
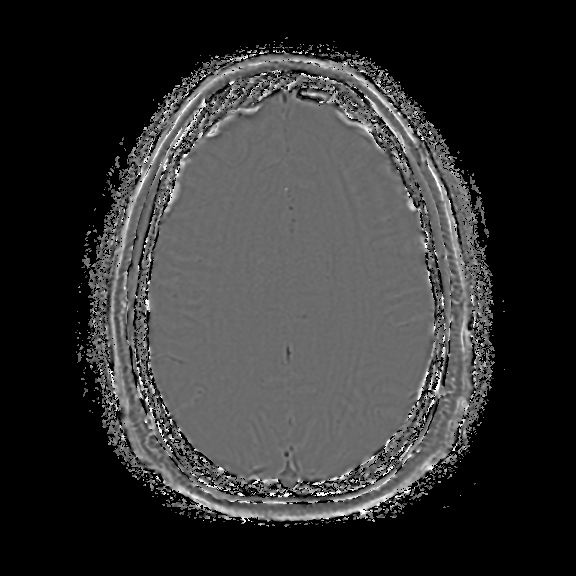
[im 170/200]
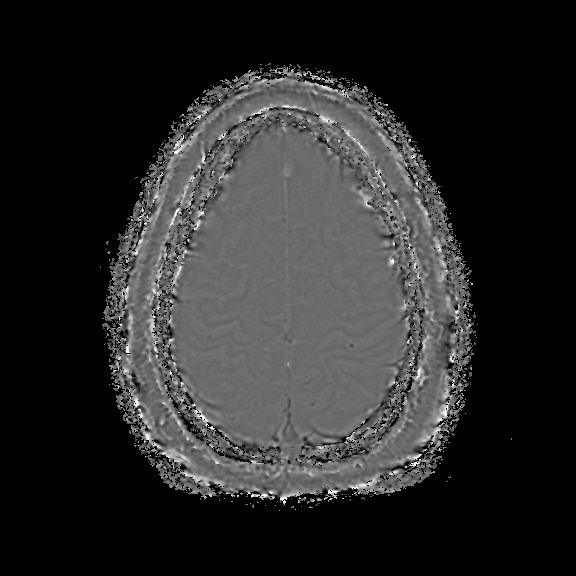
[im 190/200]
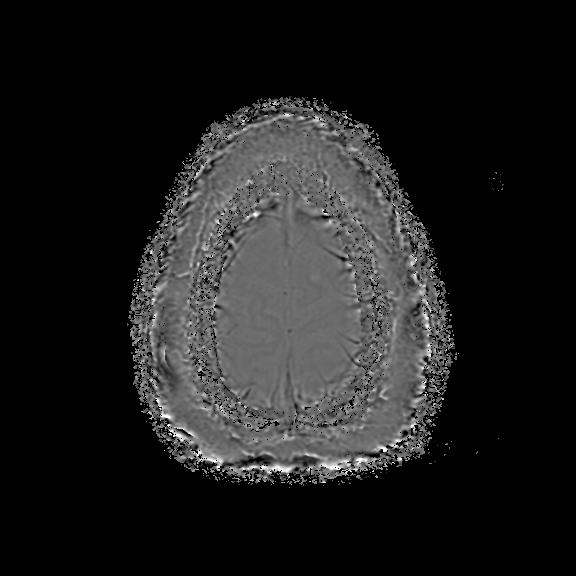

[Series 801: T1 post-contrast · axial · 5.0mm · 0.43mm/px · z∈[-66,+90]mm · 3 of 27 slices shown (1 of 2)]
[im 1/27]
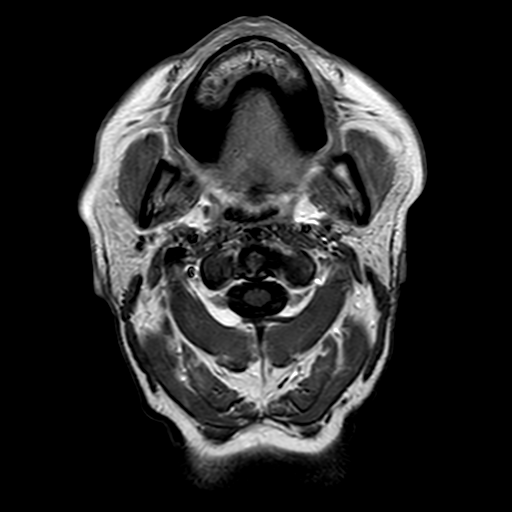
[im 14/27]
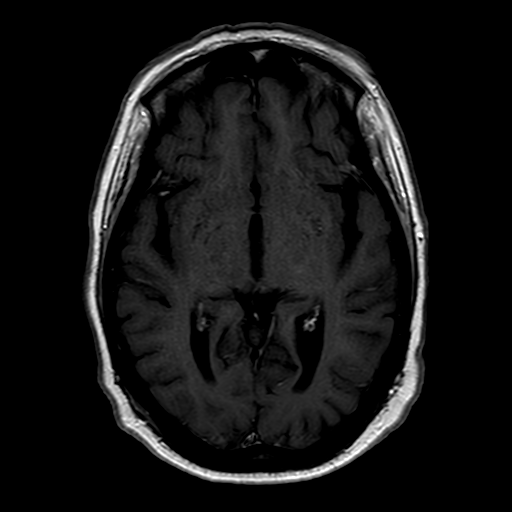
[im 27/27]
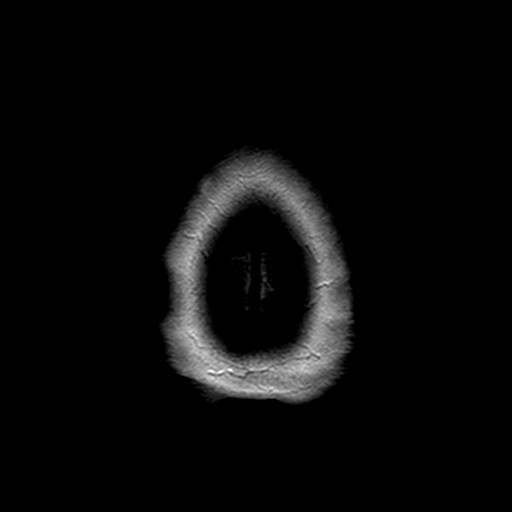

[Series 901: T1 post-contrast · coronal · 4.0mm · 0.41mm/px · 4 of 34 slices shown (2 of 2)]
[im 1/34]
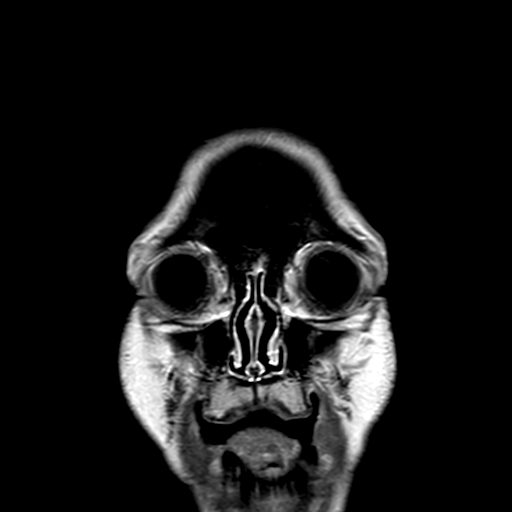
[im 12/34]
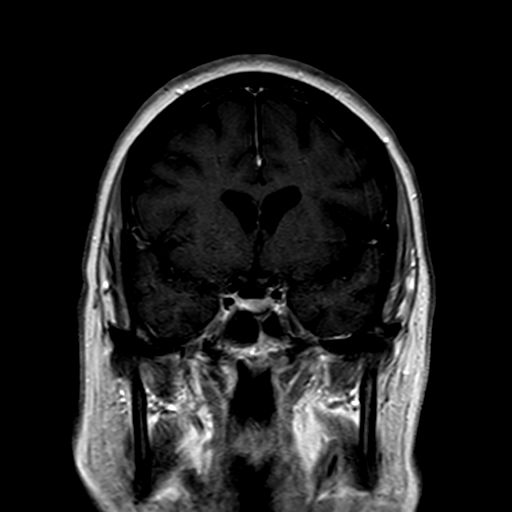
[im 23/34]
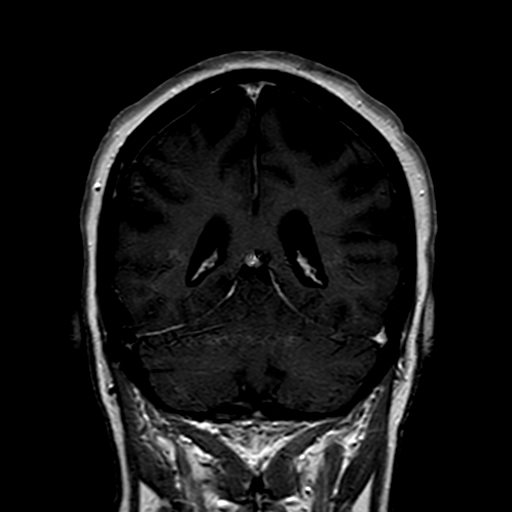
[im 34/34]
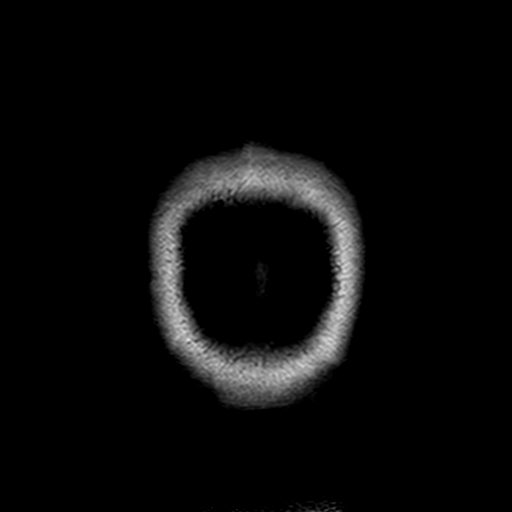

[17 of 48 positions shown; findings below may reference images not displayed]

FINDINGS: There is a punctate area of increased B 2666 signal intensity and 
decreased ADC map without abnormal enhancement involving the left caudate head. 
This is seen for example on the axial images, 17 There is a punctate area of 
increased T2 signal intensity at this level. Additionally, there are scattered 
punctate foci of hemosiderin deposition involving both the supratentorial and 
infratentorial regions, predominantly peripheral at the cortical/subcortical 
level. 
The ventricles are normal in size. There are patchy areas of abnormal T2 and 
FLAIR signal intensity within the periventricular and deep cortical white matter 
most compatible with chronic microangiopathic disease. No extra-axial fluid 
collection. Normal large vessel flow voids at the level of the skull base. 
Normal appearance of the cranial nerves VII and VIII. Mastoid mucous retention 
cyst right maxillary sinus. Mastoid cells are clear. Orbits are symmetric. 
Calvarium is intact. Cerebellar tonsils are well located. No abnormal 
enhancement of the parenchyma or meninges. No enhancing mass.
IMPRESSION: 1.  Punctate area of restricted diffusion with subtle increased T2 signal 
intensity involving the left caudate head. This may represent a subacute 
infarct, however additional etiologies cannot be excluded including subtle focus 
of metastatic involvement. Recommend follow-up exam in one months time with 
gadolinium contrast material. 
2.  Scattered foci of hemosiderin suggested on the susceptibility series. This 
is a nonspecific finding and can be seen with etiologies such as amyloid, 
sequelae from prior noxious insult/trauma or hypertensive disease. 
3.  Chronic microangiopathic changes

## 2019-12-09 IMAGING — MR MRI BRAIN W/WO CONTRAST
10 of 14 series · 26 of 48 positions shown · non-contrast
Comparison: 11/10/2019 and MRI brain within the last 12 months.

MRI BRAIN W/WO CONTRAST, 12/09/2019 [DATE]: 
CLINICAL INDICATION:  History of breast malignancy. Follow-up to abnormality on 
recent prior brain MRI.
TECHNIQUE: Multiplanar, multiecho position MR images of the brain were performed 
with and without intravenous contrast enhancement.  7.5 cc Gadovist injected 
intravenously. The patient's eGFR was calculated to be 75.8 using the i-STAT 
device.

[Series 101: survey · axial · 10.0mm · 0.94mm/px · 1 of 5 slices shown]
[im 1/5]
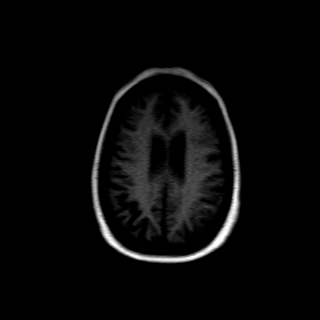

[Series 201: t1_se_sag · sagittal · 4.0mm · 0.49mm/px · 1 of 28 slices shown]
[im 1/28]
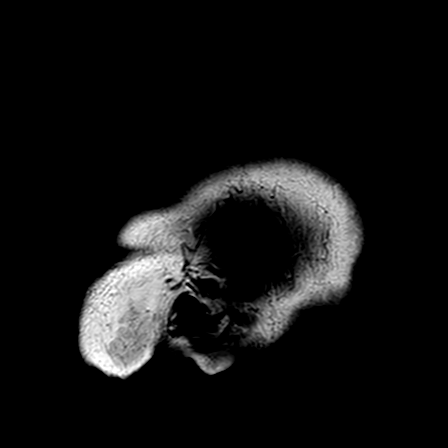

[Series 301: flair_ax · axial · 5.0mm · 0.49mm/px · z∈[-98,+57]mm · 2 of 27 slices shown]
[im 1/27]
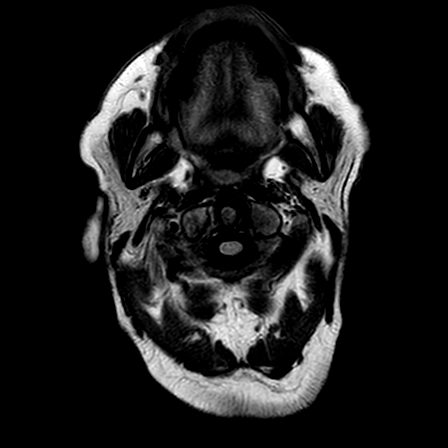
[im 27/27]
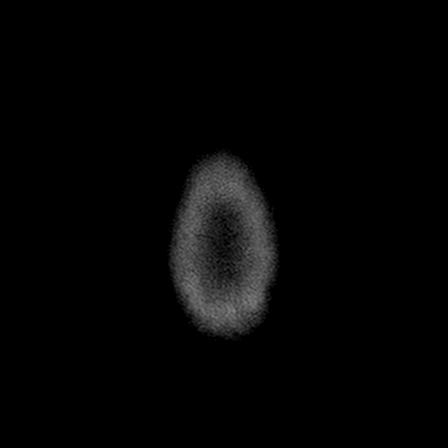

[Series 401: t1w_se_ax · axial · 5.0mm · 0.43mm/px · z∈[-98,+57]mm · 2 of 27 slices shown]
[im 1/27]
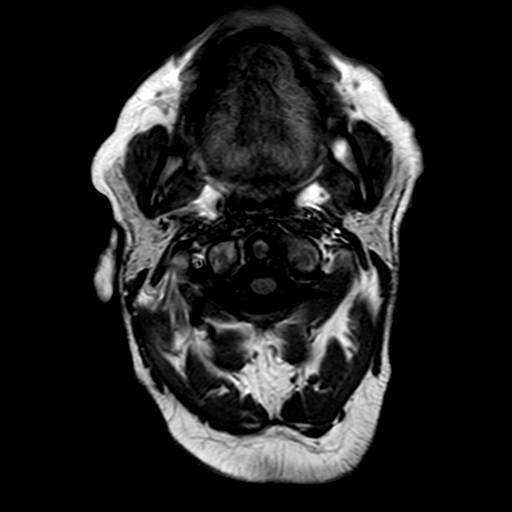
[im 27/27]
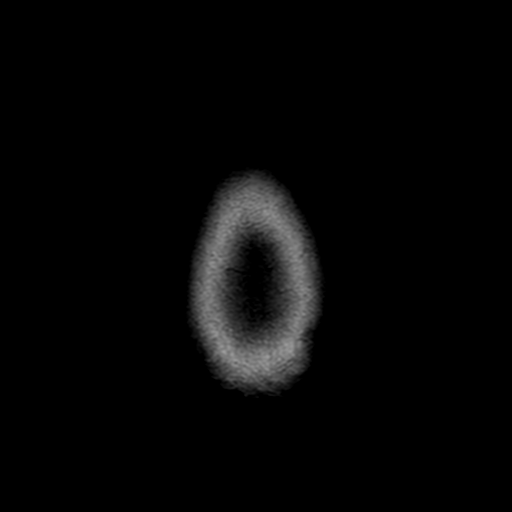

[Series 501: dwi_ax · axial · 5.0mm · 1.03mm/px · z∈[-98,+58]mm · 4 of 54 slices shown]
[im 1/54]
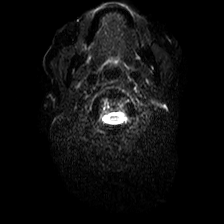
[im 18/54]
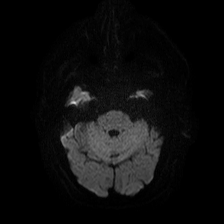
[im 36/54]
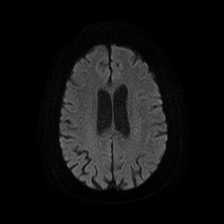
[im 54/54]
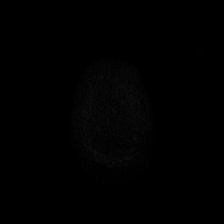

[Series 503: dadc map · axial · 5.0mm · 1.03mm/px · z∈[-98,+58]mm · 2 of 27 slices shown]
[im 1/27]
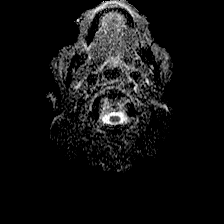
[im 27/27]
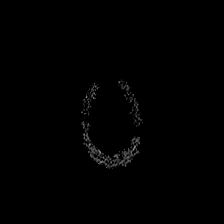

[Series 504: sb0 · axial · 5.0mm · 1.03mm/px · z∈[-98,+58]mm · 2 of 27 slices shown]
[im 1/27]
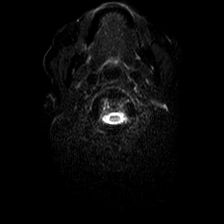
[im 27/27]
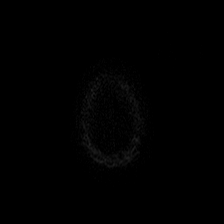

[Series 505: (id) · axial · 5.0mm · 1.03mm/px · z∈[-98,+58]mm · 2 of 27 slices shown]
[im 1/27]
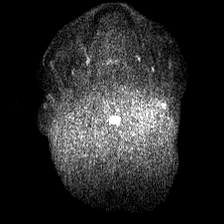
[im 27/27]
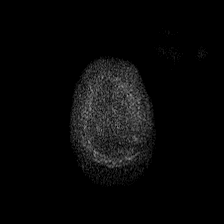

[Series 601: SWI · axial · 3.0mm · 0.40mm/px · z∈[-94,+54]mm · 8 of 200 slices shown]
[im 1/200]
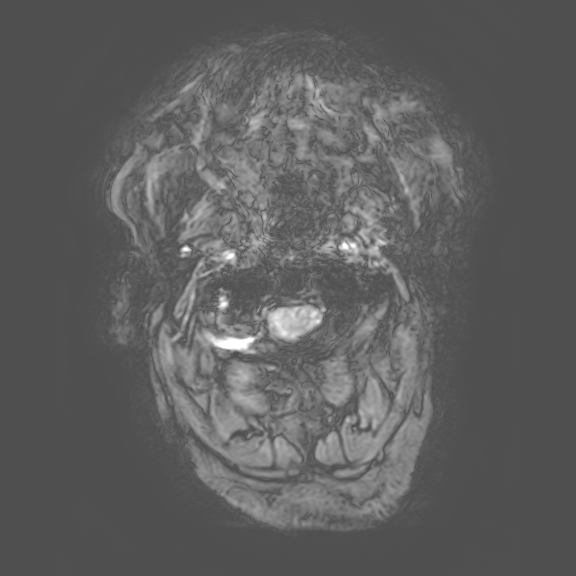
[im 31/200]
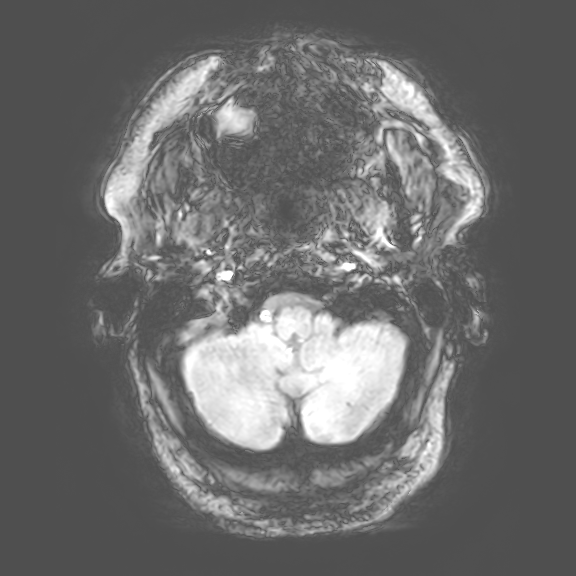
[im 62/200]
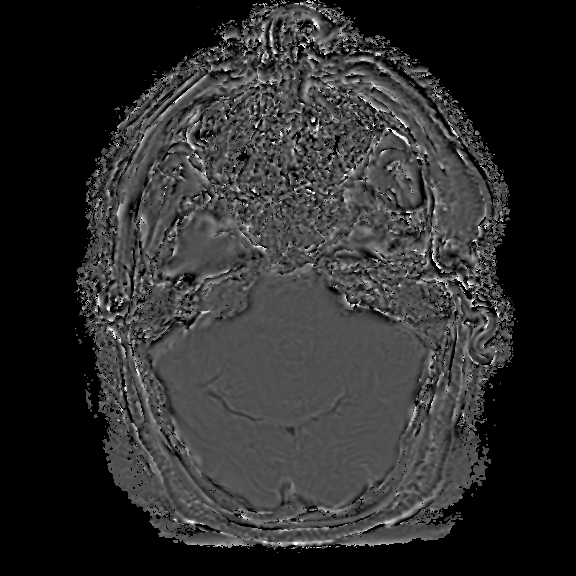
[im 92/200]
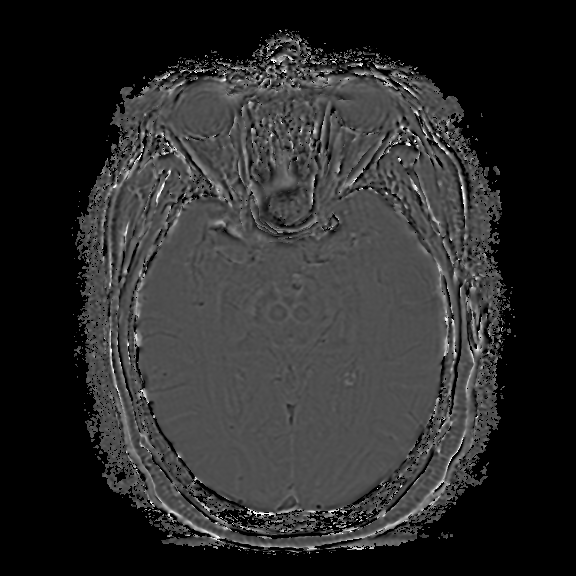
[im 108/200]
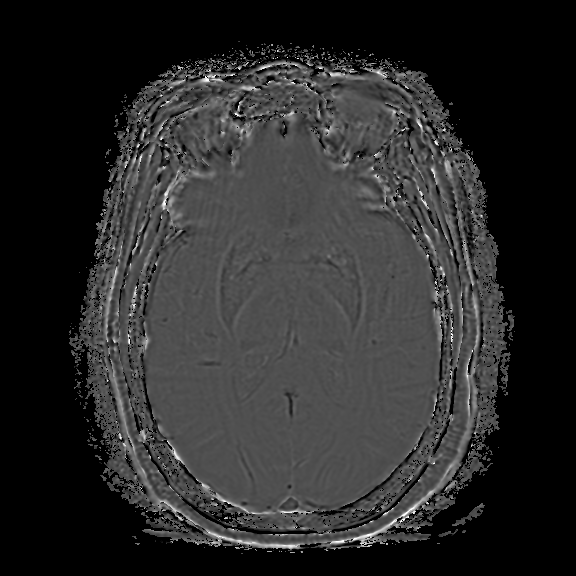
[im 138/200]
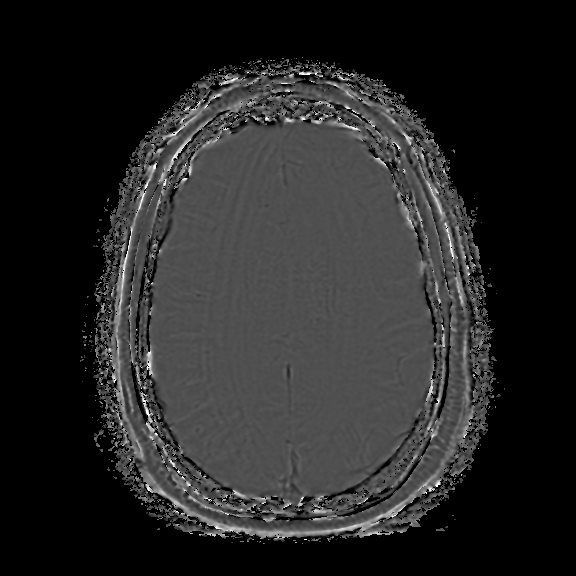
[im 169/200]
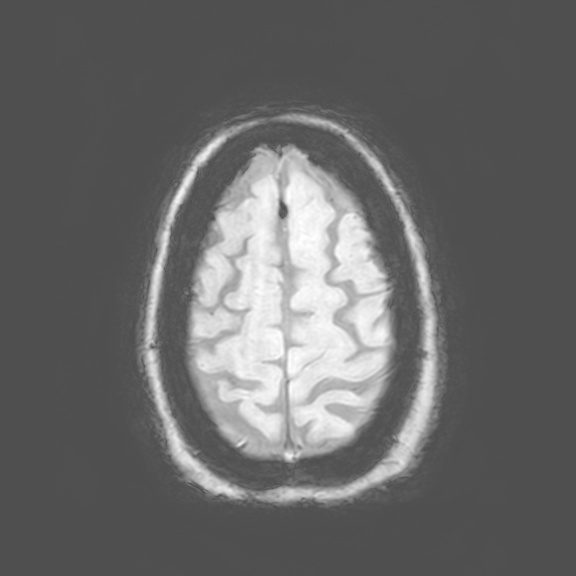
[im 200/200]
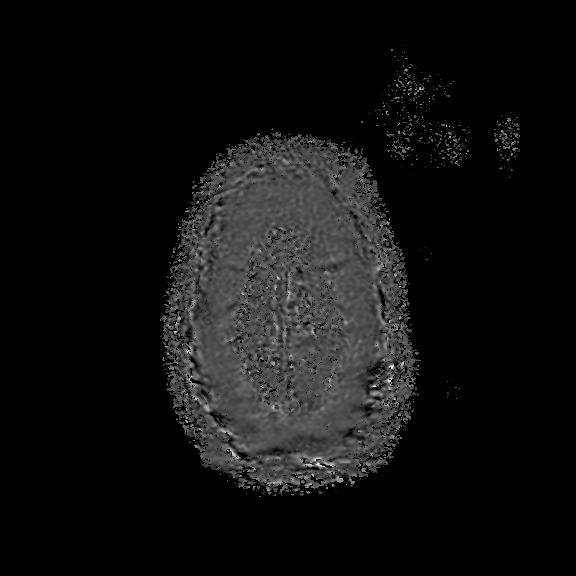

[Series 602: swip · axial · 10.0mm · 0.36mm/px · z∈[-86,-71]mm · 2 of 140 slices shown]
[im 1/140]
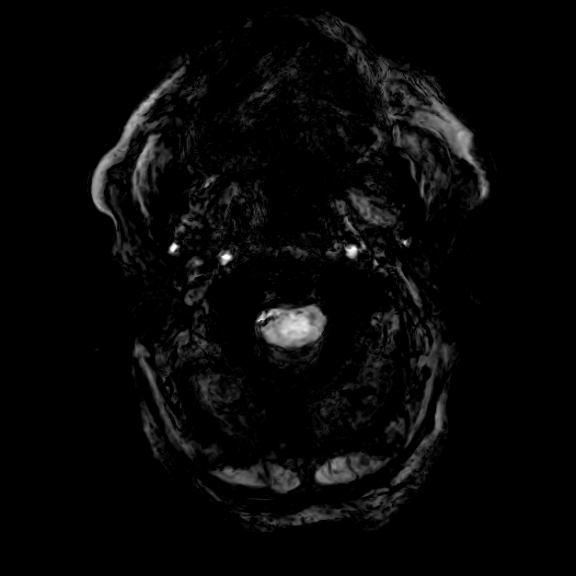
[im 16/140]
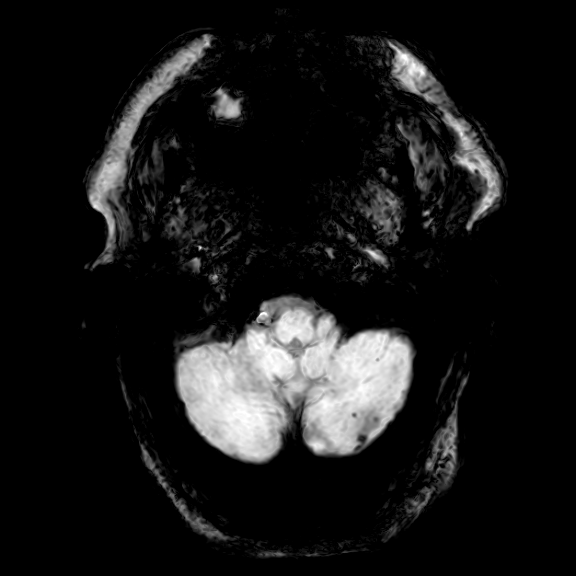

[26 of 48 positions shown; findings below may reference images not displayed]

FINDINGS: Previous described punctate focus of restricted diffusion in the region of the 
LEFT caudate nucleus has resolved without sequela. Currently the diffusion 
sequences are normal. Susceptibility sequences again show numerous punctate foci 
of parenchymal hemosiderin deposition throughout the brain. Most of these are 
located peripherally suggesting potential diagnosis of amyloid angiopathy as the 
most likely etiology. Mild cerebral atrophy is present with mild scattered 
chronic microangiopathic changes. No extra-axial fluid collections or 
intracranial mass effect are detected. No abnormal intracranial contrast 
enhancement is present. Normal flow void is in the major arteries at the base of 
the brain. Incidental mucous retention cyst in the RIGHT maxillary sinus noted. 
Small nasopharyngeal Tornwaldt cyst also incidentally present.
IMPRESSION: 1. No evidence for brain metastasis or other acute appearing intracranial 
pathology. 
2. Previously described punctate area of restricted diffusion in the LEFT 
caudate nucleus has resolved without sequela. 
3. Minor chronic microangiopathic white matter changes, stable. 
4. Scattered punctate foci of hemosiderin deposition are throughout the brain 
showing a prominent peripheral subcortical location suggesting amyloid 
angiopathy as the most likely underlying etiology.

## 2020-01-06 IMAGING — MR MRI RIGHT SHOULDER WITH AND WITHOUT CONTRAST
4 series · 19 of 40 positions shown · IV contrast (gadolinium)
Comparison: none

MRI RIGHT SHOULDER WITH AND WITHOUT CONTRAST, 01/06/2020 [DATE]: 
CLINICAL INDICATION:  Shoulder pain. Status post rotator cuff repair.
TECHNIQUE: Multiplanar, multiecho position MR images of the shoulder were 
performed pre and post-arthrogram.
TECHNIQUE: Multiplanar, multiecho position MR images of the right shoulder were 
performed without intravenous gadolinium enhancement.

[Series 201: survey_right · axial · 10.0mm · 0.99mm/px · z∈[+0,+175]mm · 3 of 15 slices shown]
[im 3/15]
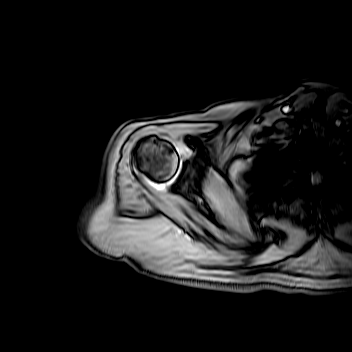
[im 9/15]
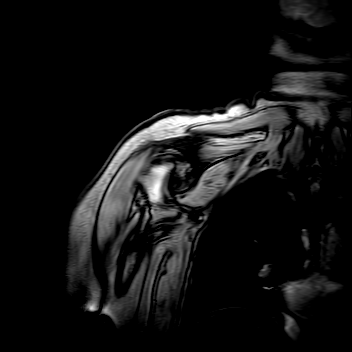
[im 15/15]
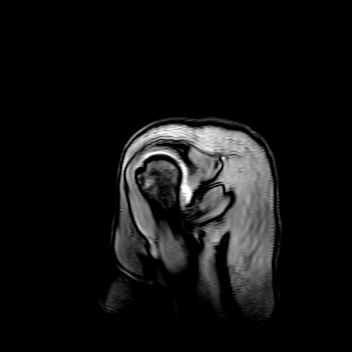

[Series 301: T1 · axial · 2.5mm · 0.27mm/px · z∈[-47,+10]mm · 10 of 25 slices shown (1 of 3)]
[im 1/25]
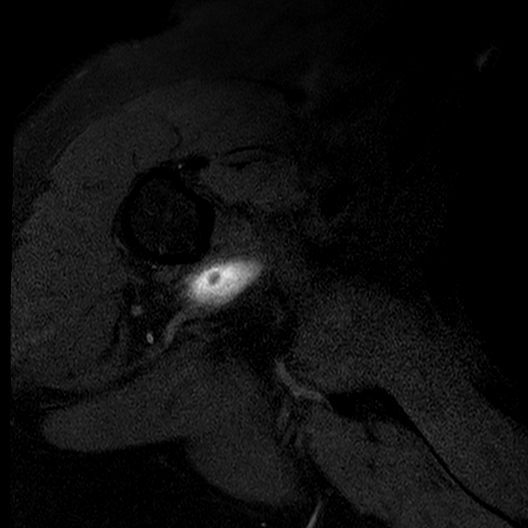
[im 3/25]
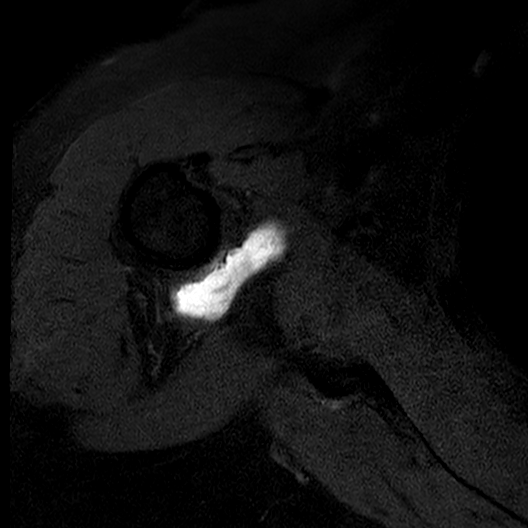
[im 5/25]
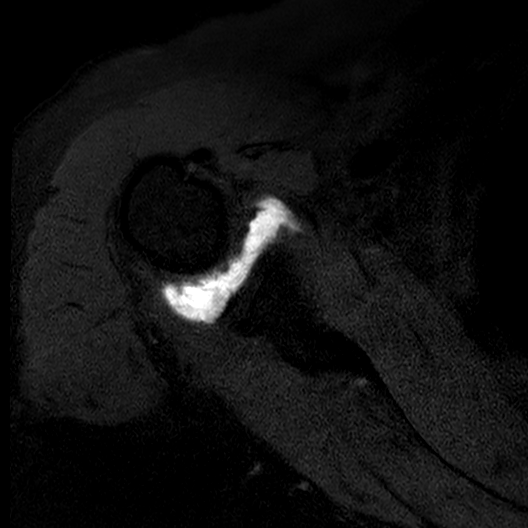
[im 8/25]
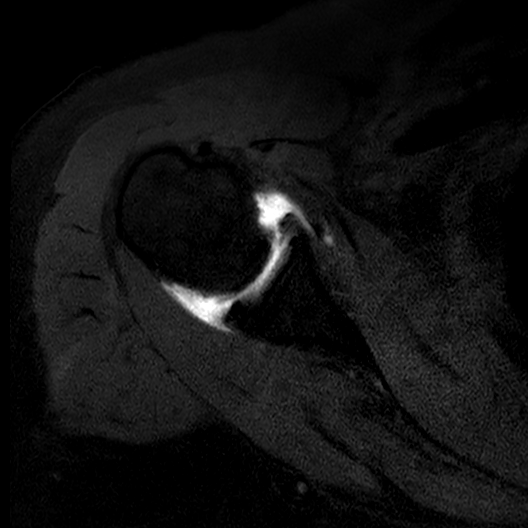
[im 10/25]
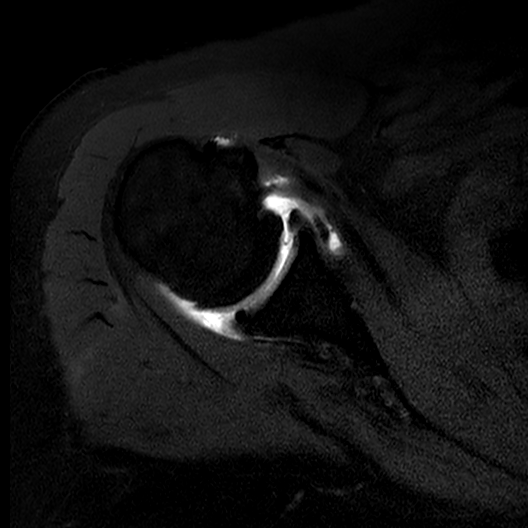
[im 13/25]
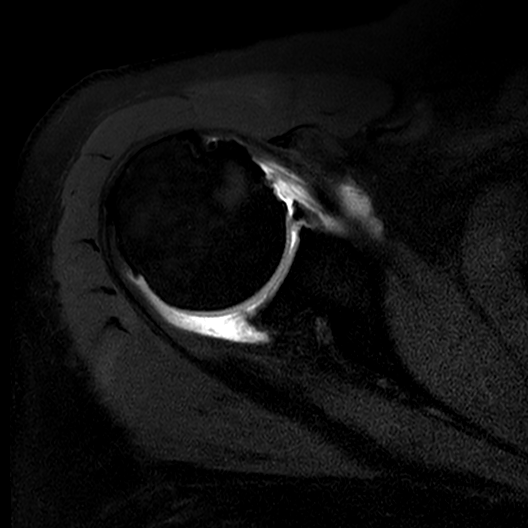
[im 15/25]
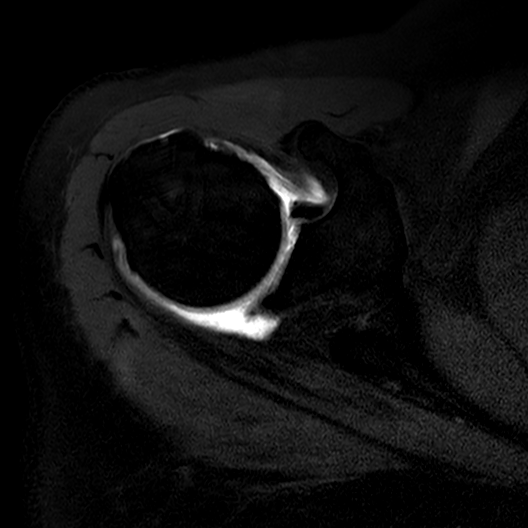
[im 17/25]
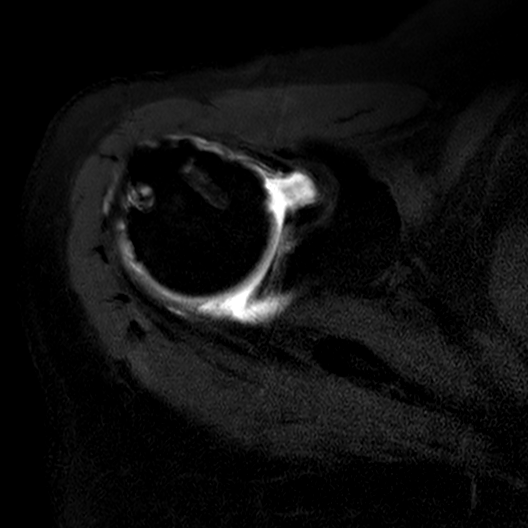
[im 20/25]
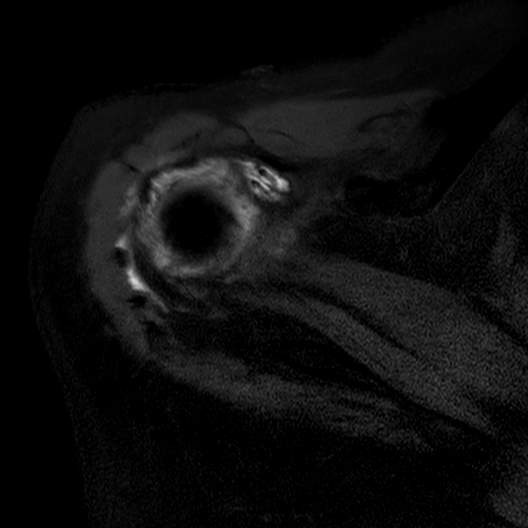
[im 22/25]
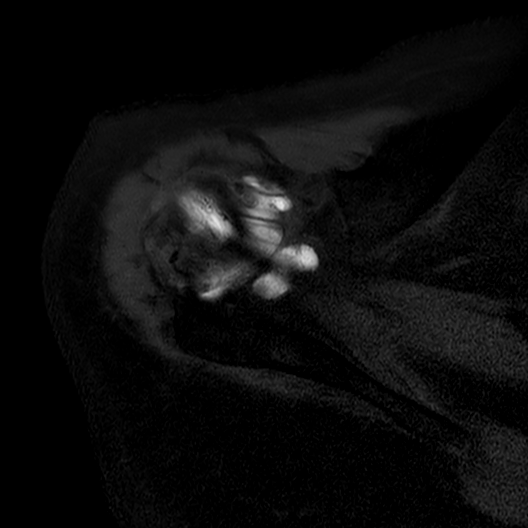

[Series 401: T1 · sagittal · 3.0mm · 0.27mm/px · 3 of 24 slices shown (2 of 3)]
[im 3/24]
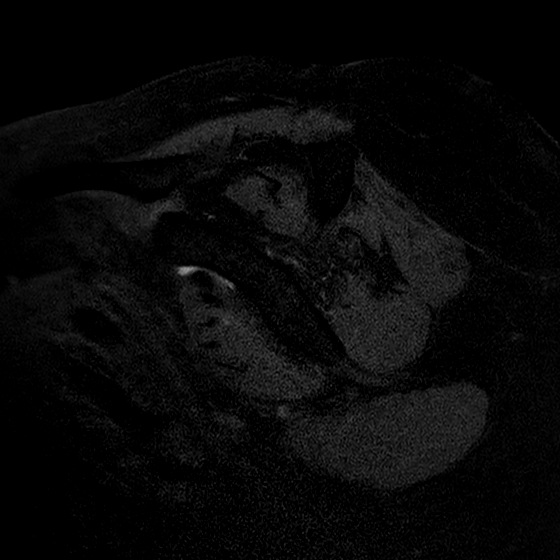
[im 12/24]
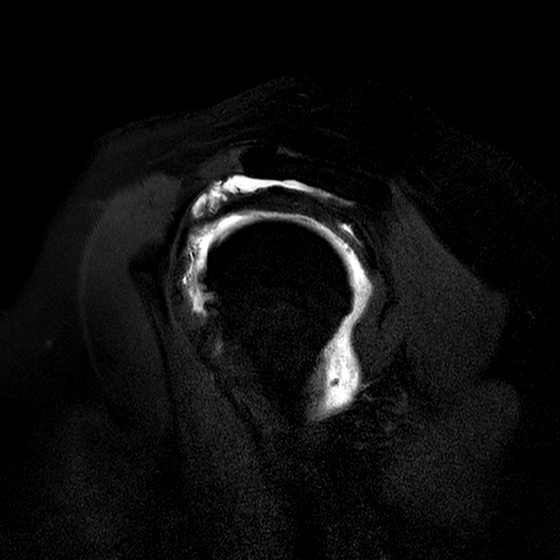
[im 21/24]
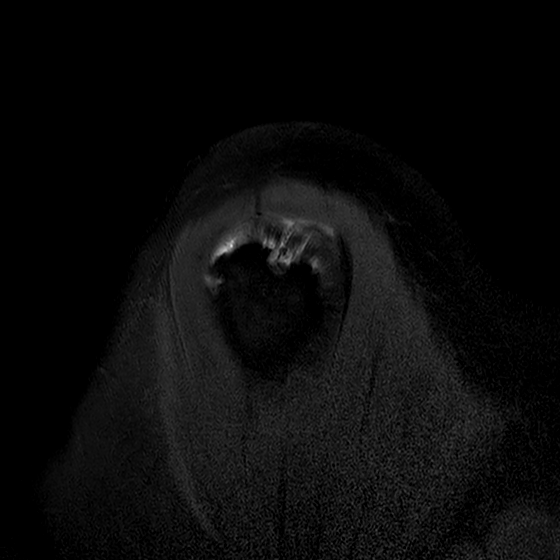

[Series 501: T1 · oblique · 2.5mm · 0.27mm/px · 3 of 27 slices shown (3 of 3)]
[im 5/27]
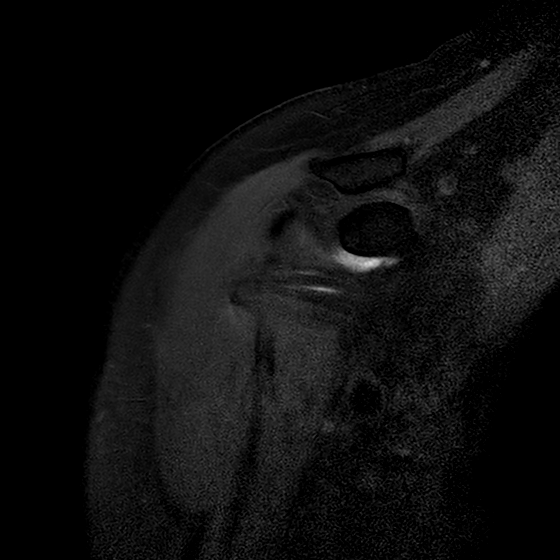
[im 15/27]
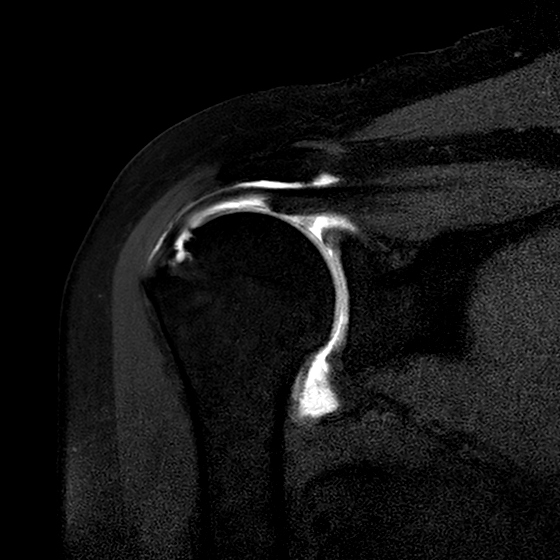
[im 24/27]
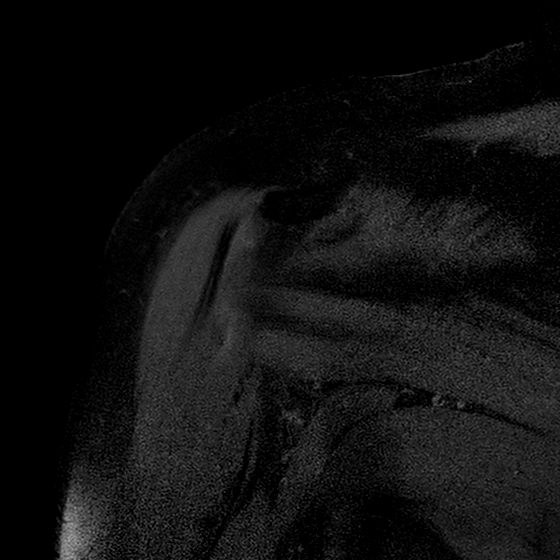

[19 of 40 positions shown; findings below may reference images not displayed]

FINDINGS: 
FINDINGS: ROTATOR CUFF: Status post rotator cuff repair with 2 absorbable suture anchors 
in the lateral humeral head. Tiny full-thickness tear of the distal 
supraspinatus tendon with extension of injected contrast into the subacromial 
subdeltoid bursa. High-grade partial-thickness delamination at the distal 
supraspinatus tendon tear measures 1.6 cm in AP dimensions with 2.8 cm maximal 
retraction of the tendinous remnant. The infraspinatus, subscapularis and teres 
minor tendons are preserved. Moderate supraspinatus and infraspinatus fatty 
muscular atrophy. 
ACROMIOCLAVICULAR JOINT: Distal clavicular resection. The coracoacromial 
ligament is intact without prominent spurring at the acromial attachment. The 
coracoclavicular ligament is preserved. 
GLENOHUMERAL JOINT: Mild degenerative change with partial-thickness 
chondromalacia and tiny osteophytes. Degenerative superior and posterosuperior 
labral tears. No paralabral cyst. The intra-articular portion of the long head 
of the biceps tendon is negative. No shoulder joint effusion. 
BONES AND SOFT TISSUES: 2 absorbable suture anchors in the lateral humeral head. 
The bone marrow signal intensity is negative for fracture. No Hill-Sachs defect. 
The axillary region is negative. Subcutaneous tissues are negative.
IMPRESSION: 1. Status post rotator cuff repair and distal clavicular resection. 
2. Tiny full-thickness tear of the distal supraspinatus tendon and 1.6 cm 
high-grade partial-thickness delaminated tear of the distal supraspinatus 
tendon. 
3. Moderate supraspinatus and infraspinatus fatty muscular atrophy. 
4. Mild glenohumeral joint degenerative change. 
5. Degenerative superior and posterosuperior labral tears.

## 2020-04-20 ENCOUNTER — Encounter

## 2020-05-22 IMAGING — MG MAMMOGRAPHY DIAGNOSTIC BILATERAL 3D TOMOSYNTHESIS WITH CAD
8 series · 8 of 24 positions shown · non-contrast
Comparison: Mammogram of 07/18/2019 and 07/08/2019. 
BREAST DENSITY: (Level B) There are scattered areas of fibroglandular density.

MAMMOGRAPHY DIAGNOSTIC BILATERAL 3D TOMOSYNTHESIS WITH CAD, 05/22/2020 [DATE]: 
CLINICAL INDICATION: History of right breast cancer treated with lumpectomy, 
radiation and chemotherapy in 2828. No current concerns.
TECHNIQUE: Digital bilateral mammograms and 3-D Tomosynthesis were obtained. 
These were interpreted both primarily and with the aid of computer-aided 
detection system.

[R CC]
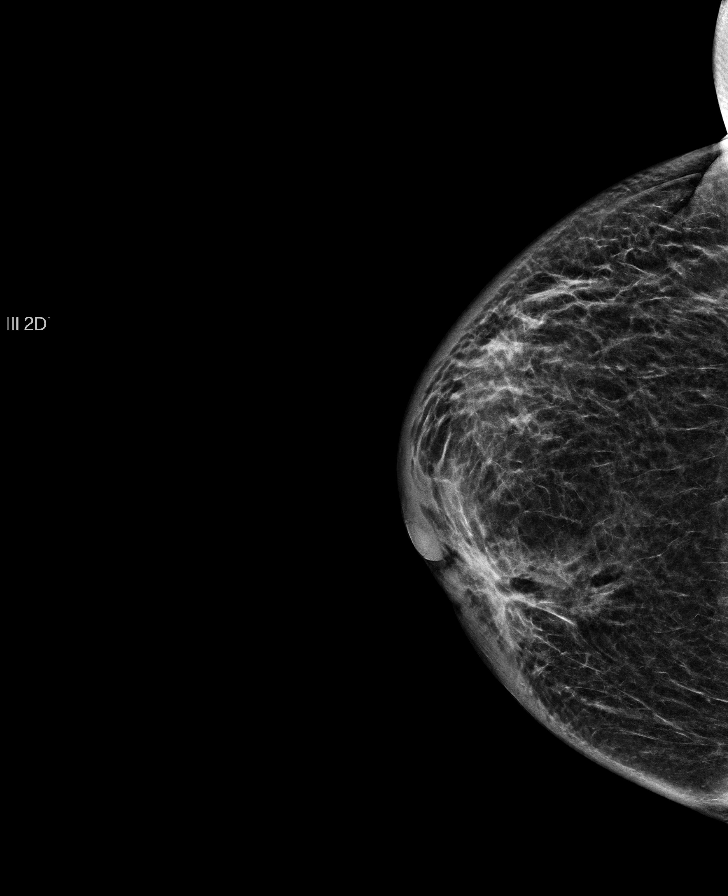

[L CC]
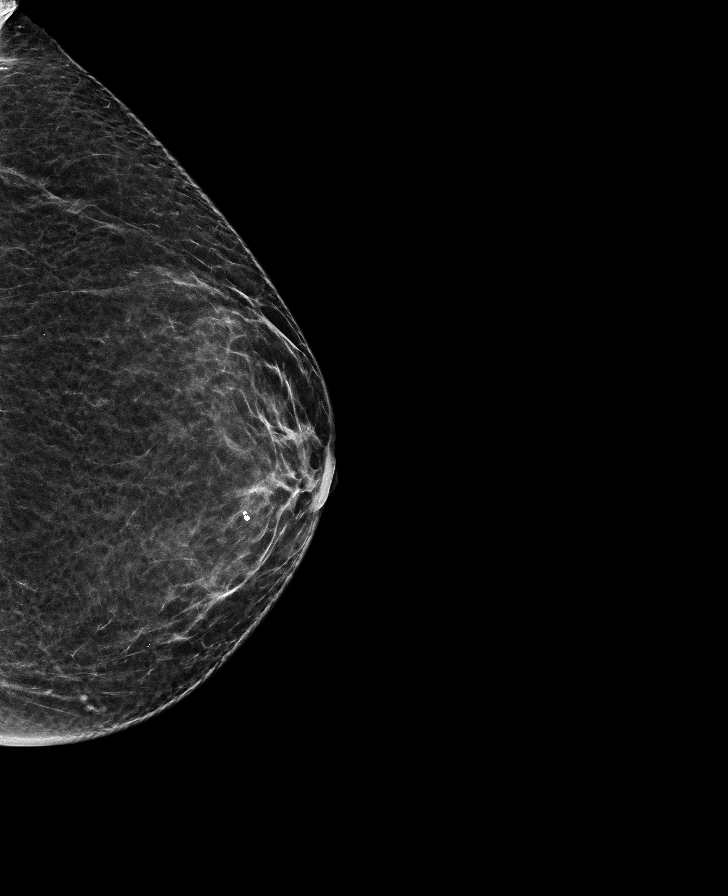

[R MLO]
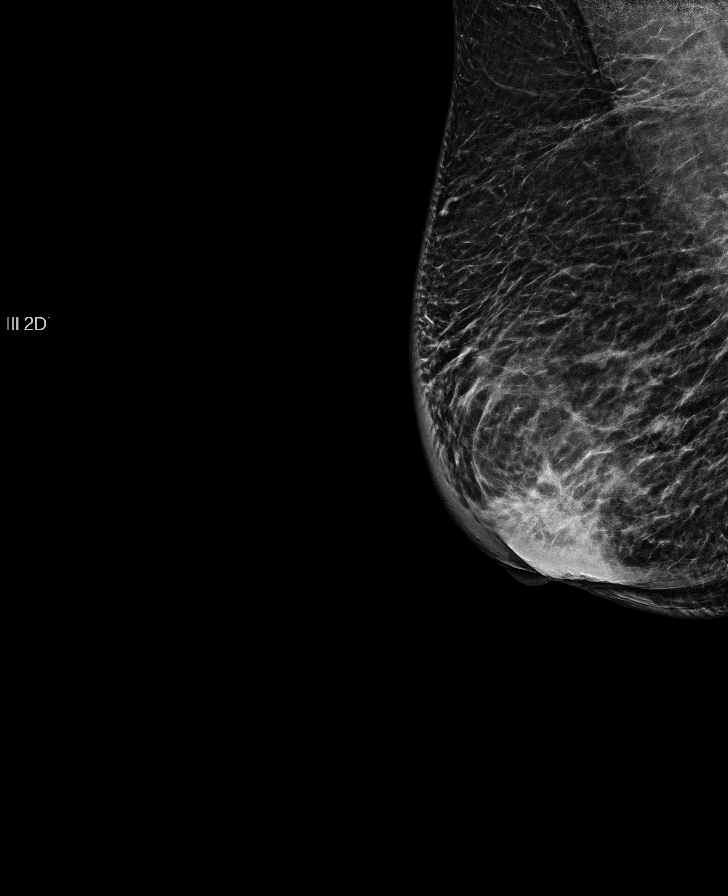

[L MLO]
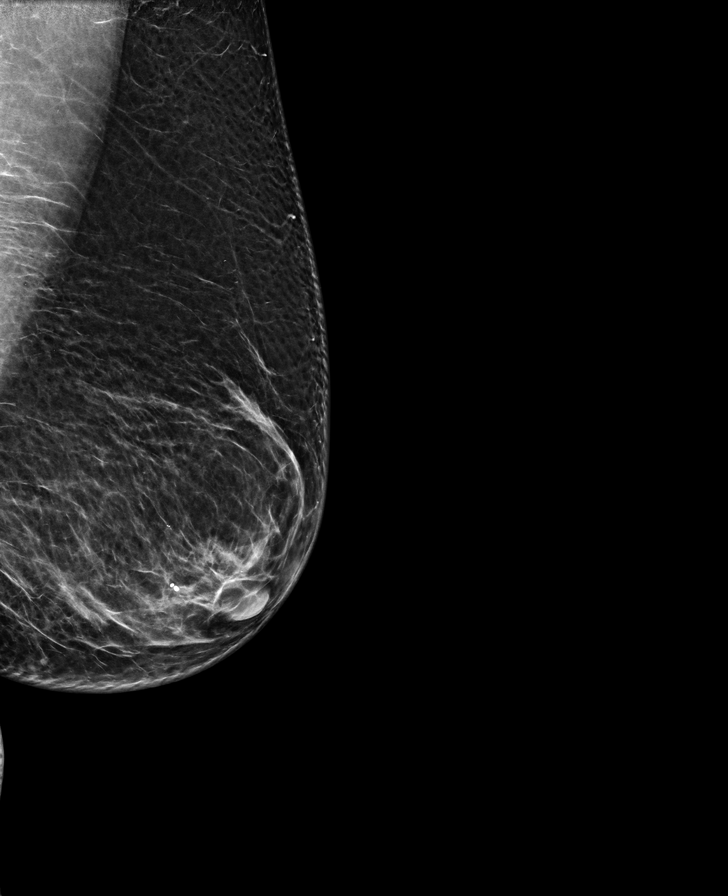

[R MLO tomo · tomo slice 33/65.0]
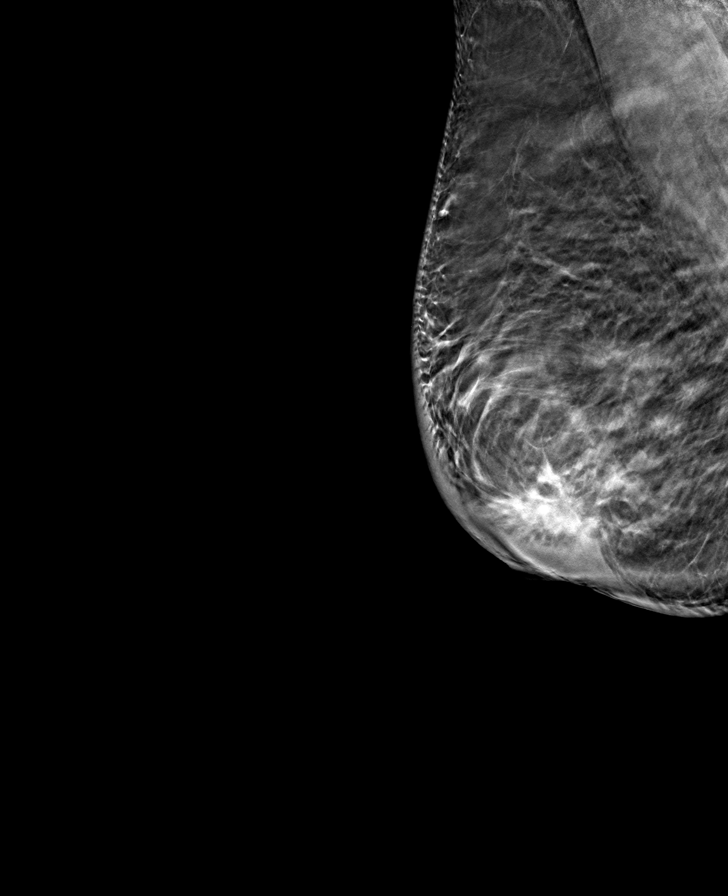

[L MLO tomo · tomo slice 32/63.0]
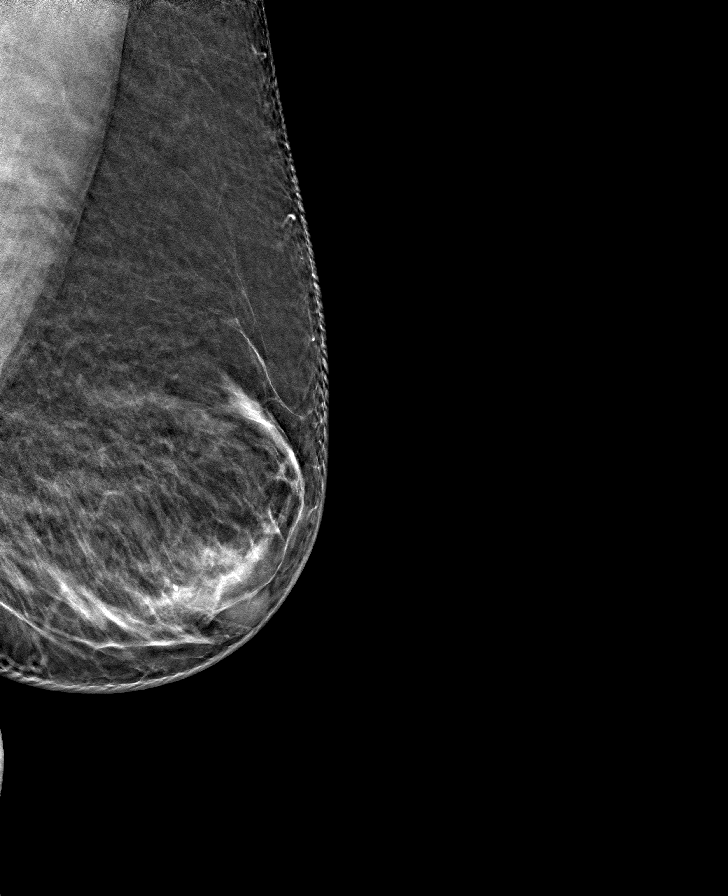

[R CC tomo · tomo slice 31/61.0]
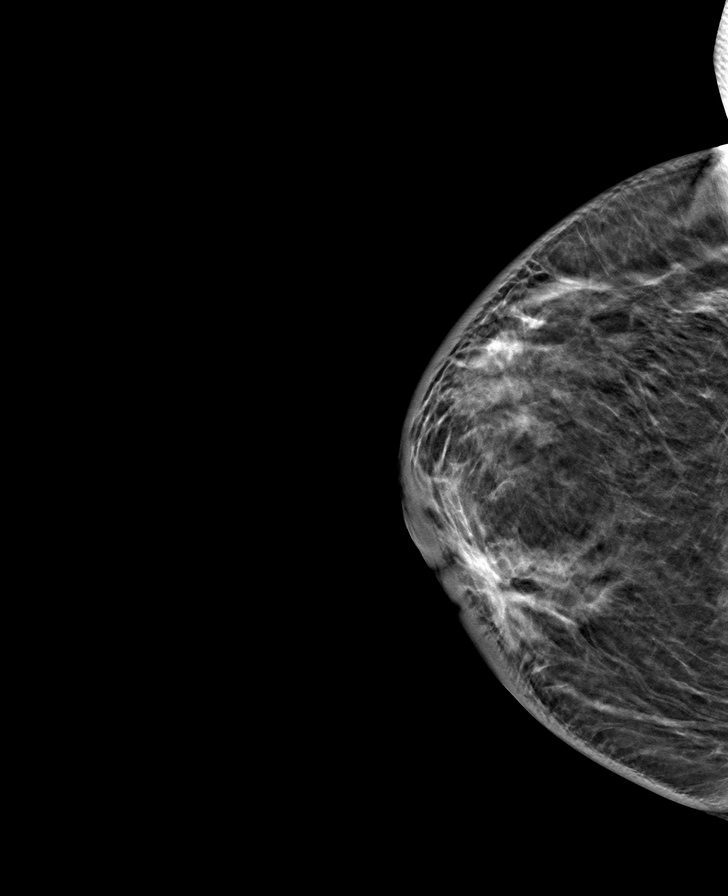

[L CC tomo · tomo slice 29/56.0]
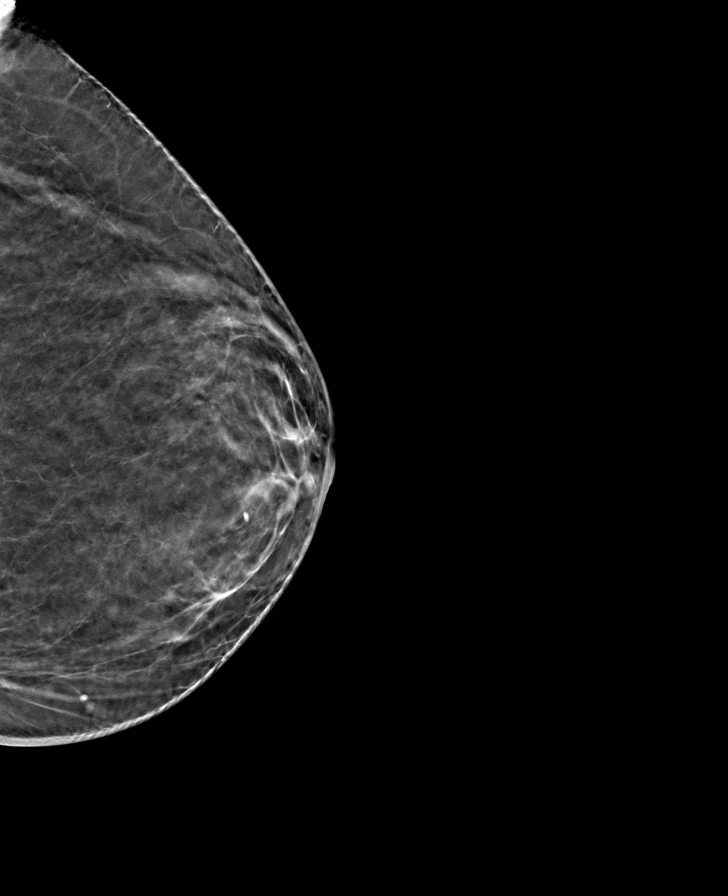

[8 of 24 positions shown; findings below may reference images not displayed]

FINDINGS: Interval posttreatment changes right breast. This will serve as a new 
baseline exam. Overall stable mammographic appearance of the left breast. No 
mammographic findings suggest for malignancy.
IMPRESSION: ( BI-RADS 2) Benign findings. Routine mammographic follow-up is recommended.

## 2020-06-11 IMAGING — DX GASTROGRAFIN ENEMA
1 series · 6 of 6 positions shown · non-contrast
Comparison: none

GASTROGRAFIN ENEMA, 06/11/2020 [DATE]: 
CLINICAL INDICATION:  Constipation.
TECHNIQUE: 2944 cc of gastrografin was instilled via the rectum. No of 
gastrografin was discarded.  The fluoroscopy time for this procedure was
minutes and a total of 3 image(s) obtained.

[Series 1: AP · U · 0.17mm/px · 6 of 6 slices shown]
[im 1/6]
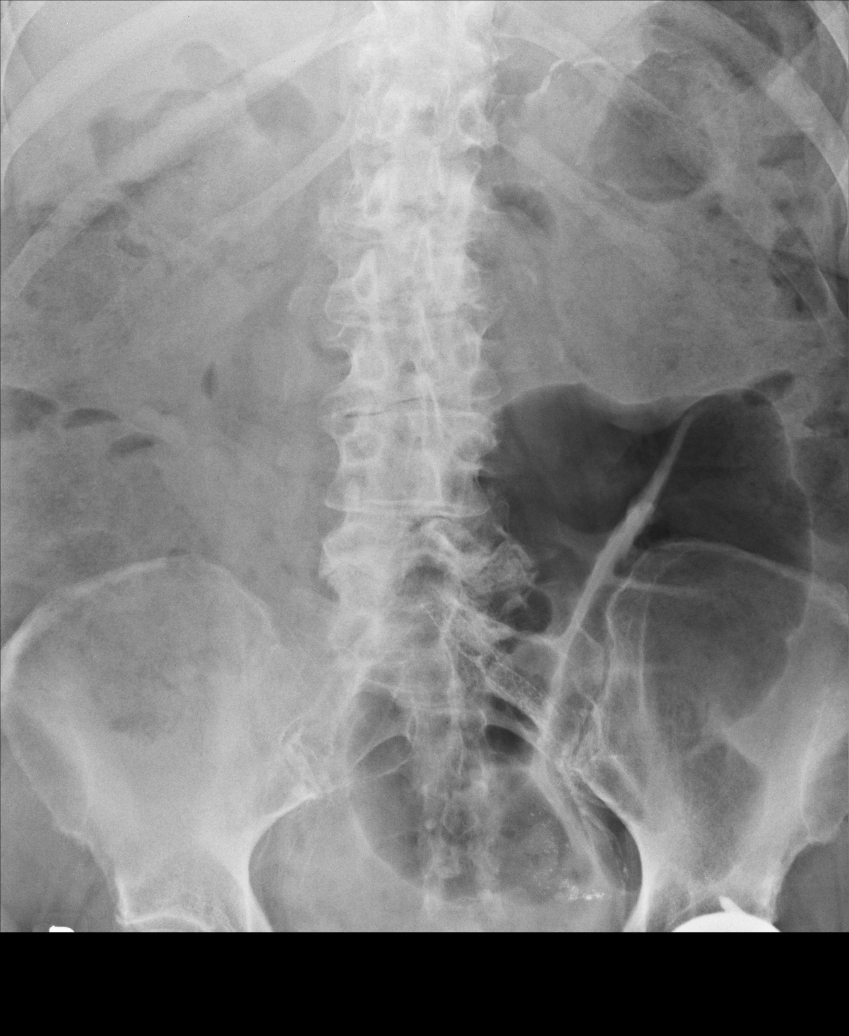
[im 2/6]
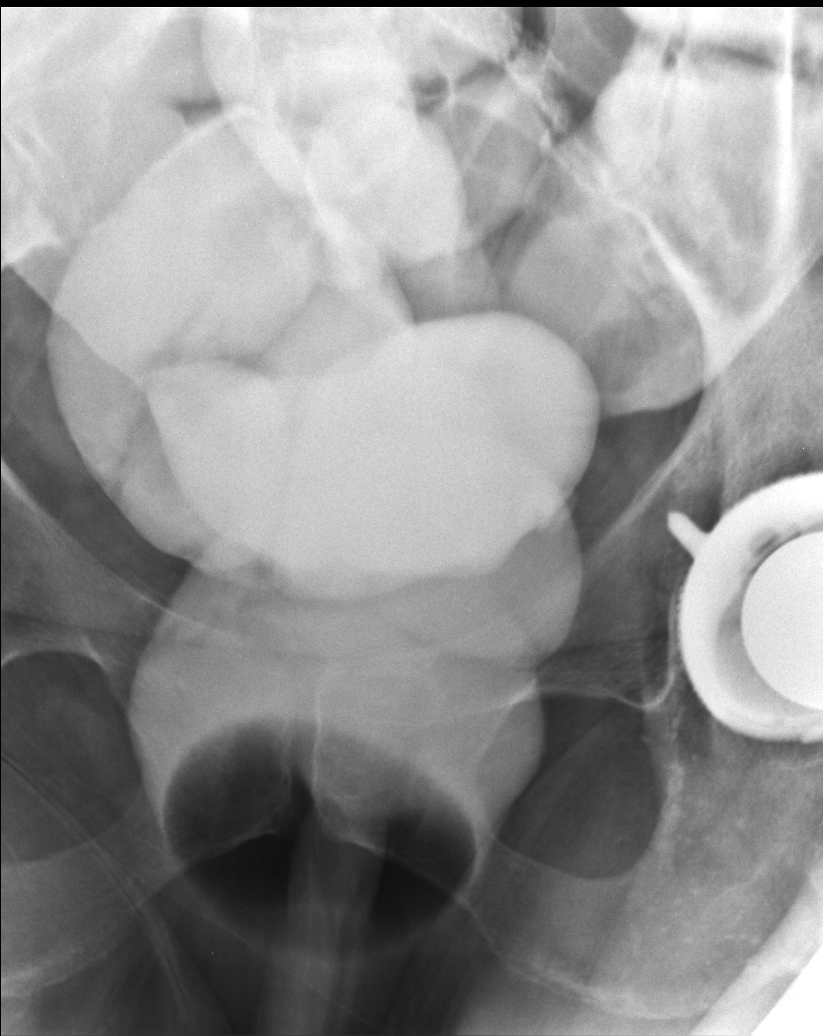
[im 3/6]
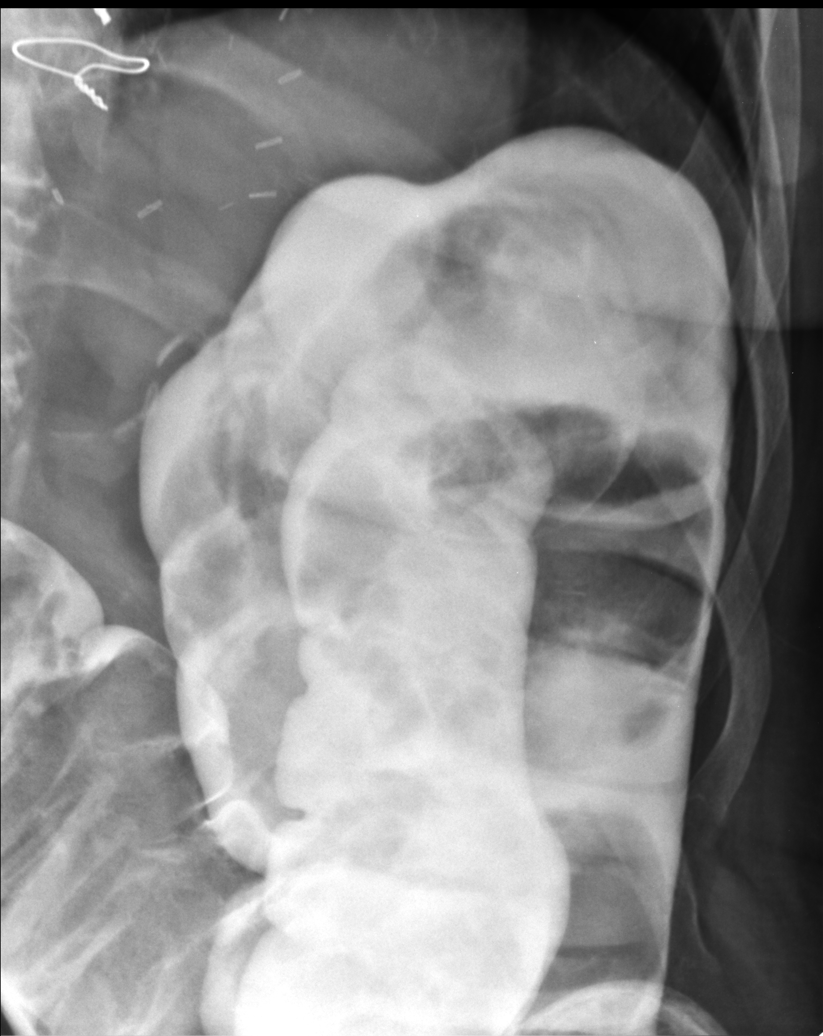
[im 4/6]
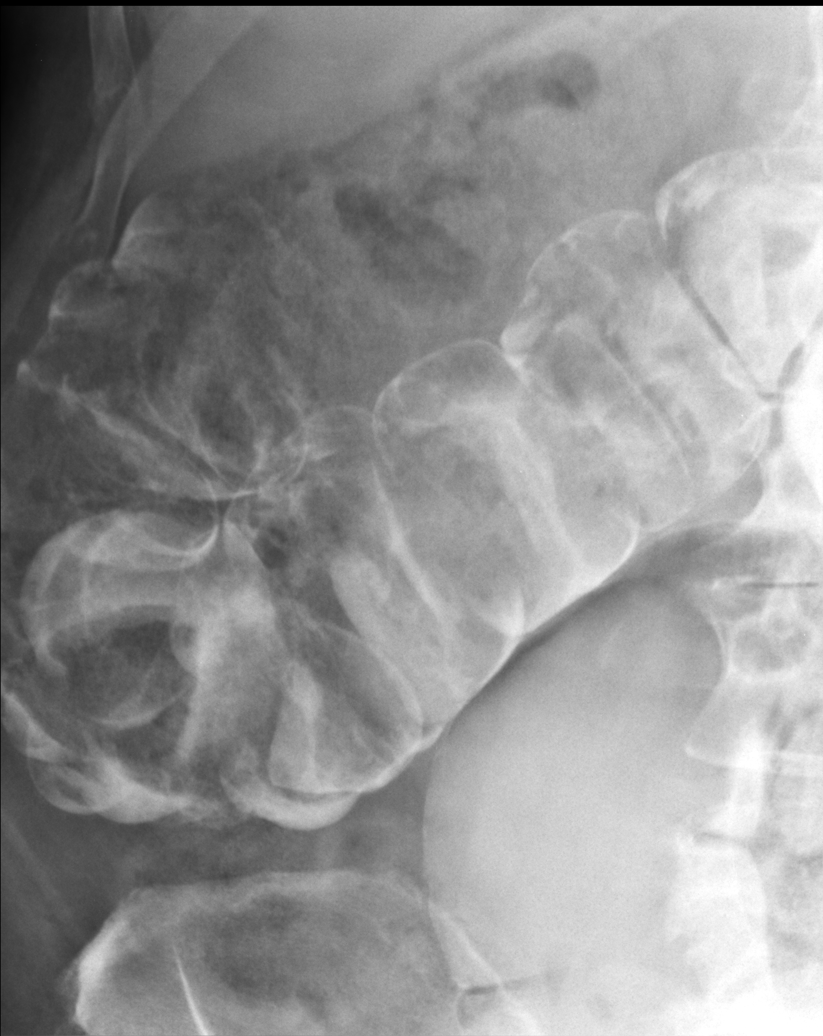
[im 5/6]
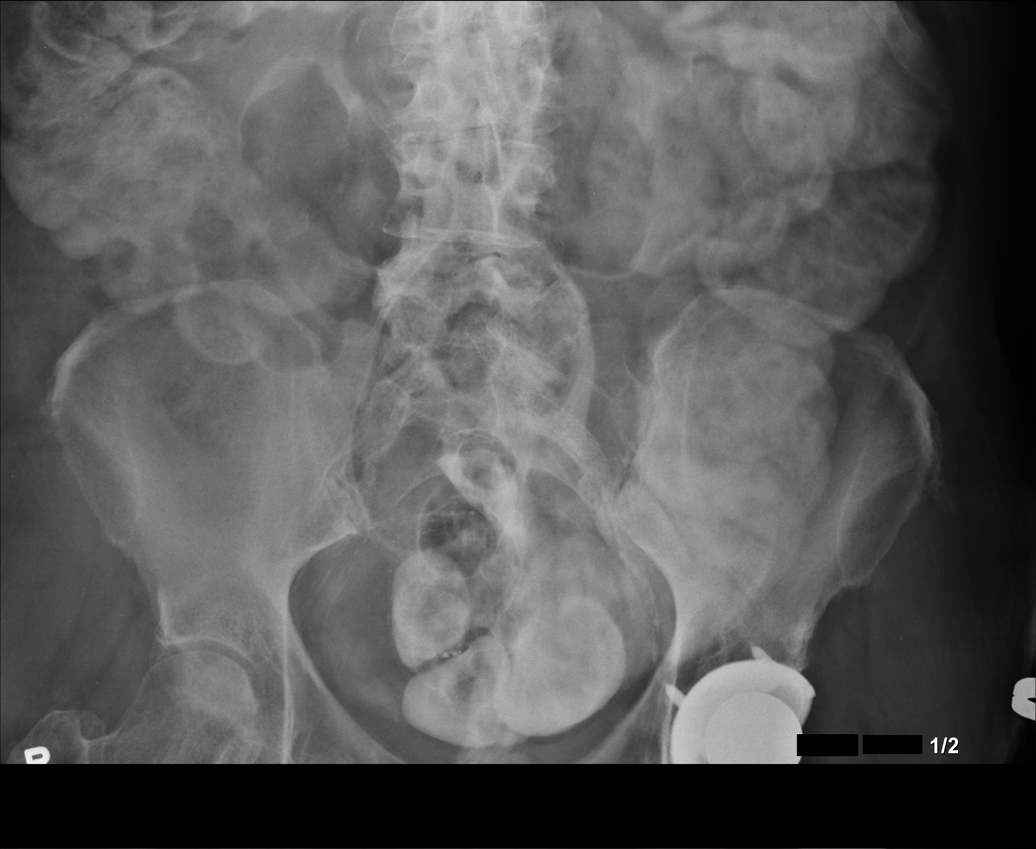
[im 6/6]
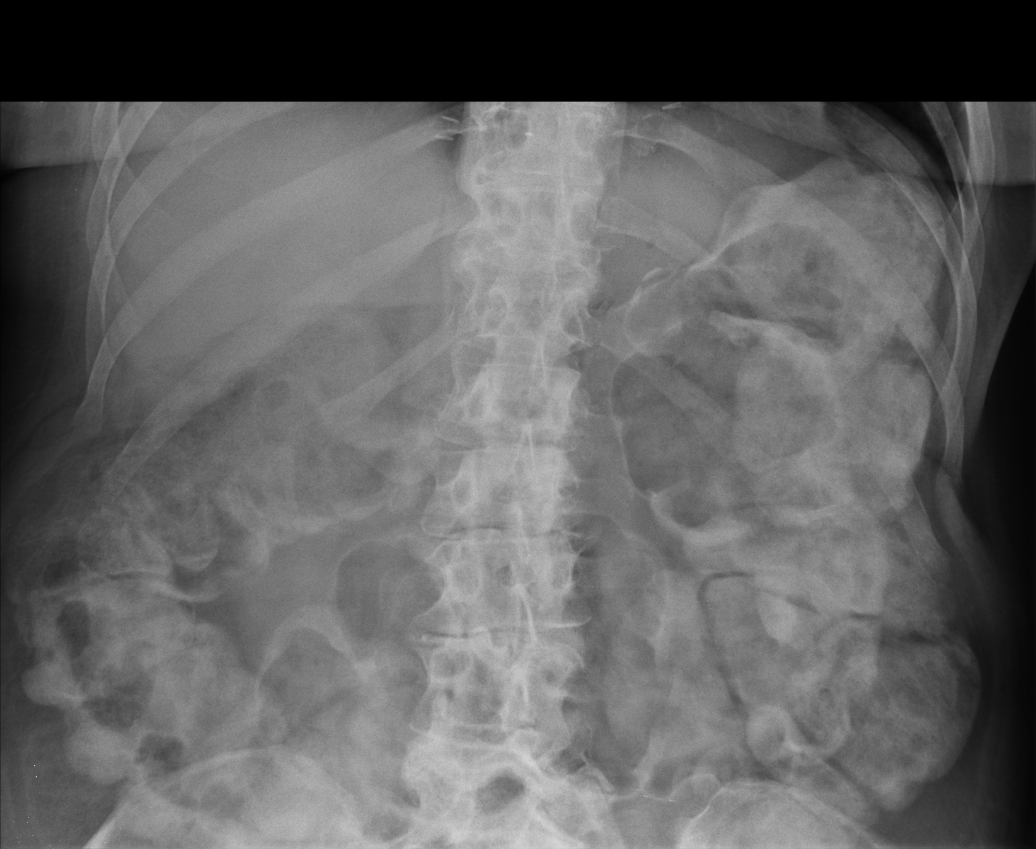

[6 of 6 positions shown; findings below may reference images not displayed]

FINDINGS: Contrast was refluxed with difficulty throughout the colon. Extensive 
stool present throughout the colon. No obstructing lesions are noted. Study is 
suboptimal for diagnosis of a mass. Patient evacuated very poorly. 
Left hip prosthesis. Sternal sutures.
IMPRESSION: Severe constipation. Poor evacuation of Gastrografin and stool from the colon. 
Patient demonstrated extensive constipation on the PET/CT of August 01, 2019.

## 2020-11-26 IMAGING — MR MRI CERVICAL SPINE WITHOUT CONTRAST
6 series · 28 of 48 positions shown · non-contrast
Comparison: No appropriate comparison exams.

MRI CERVICAL SPINE WITHOUT CONTRAST, 11/26/2020 [DATE]: 
CLINICAL INDICATION:  Right shoulder and RIGHT arm and hand tingling and 
numbness for several months. Restricted range of motion in the neck. History of 
breast malignancy.
TECHNIQUE: Multiplanar, multiecho position MR images of the cervical spine were 
performed without intravenous contrast enhancement.

[Series 201: survey · axial · 10.0mm · 0.94mm/px · z∈[-78,+88]mm · 3 of 9 slices shown]
[im 1/9]
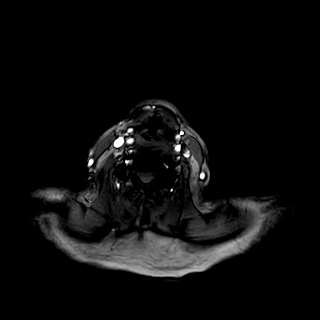
[im 5/9]
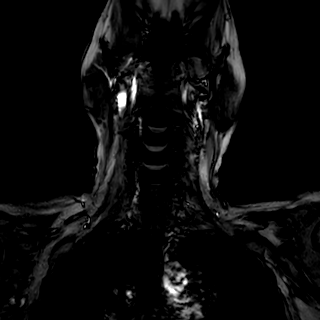
[im 9/9]
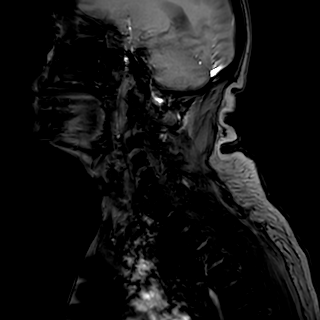

[Series 301: t1w_tse sag · sagittal · 3.0mm · 0.50mm/px · 5 of 15 slices shown]
[im 1/15]
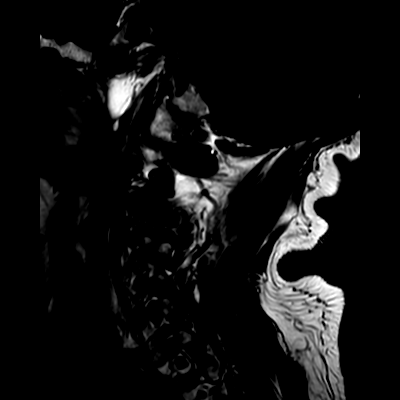
[im 4/15]
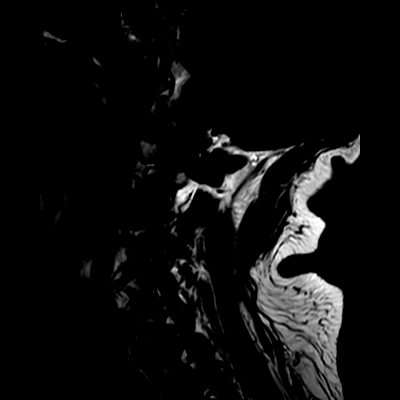
[im 8/15]
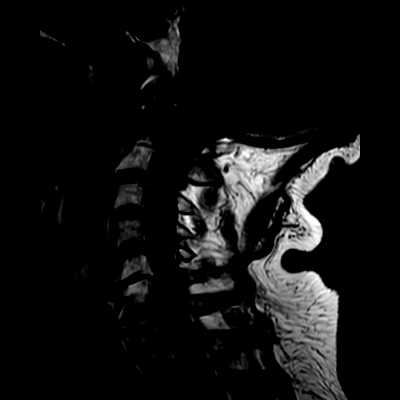
[im 11/15]
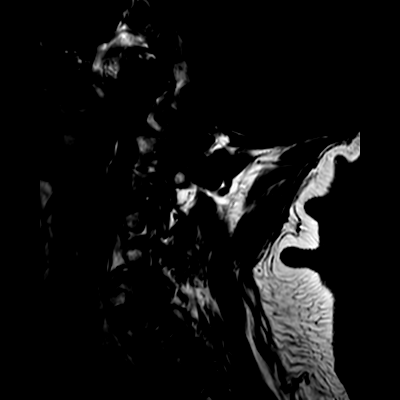
[im 15/15]
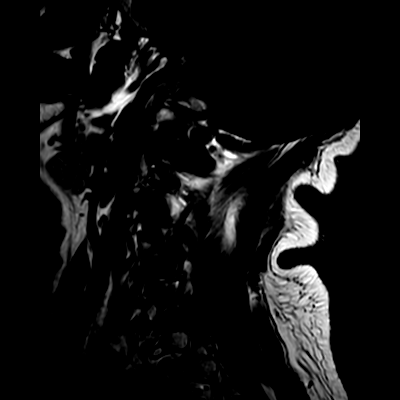

[Series 401: t2w_tse sag · sagittal · 3.0mm · 0.50mm/px · 5 of 15 slices shown]
[im 1/15]
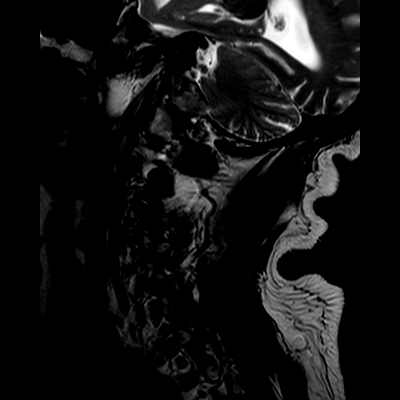
[im 4/15]
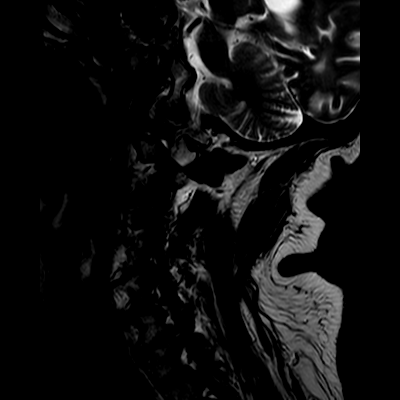
[im 8/15]
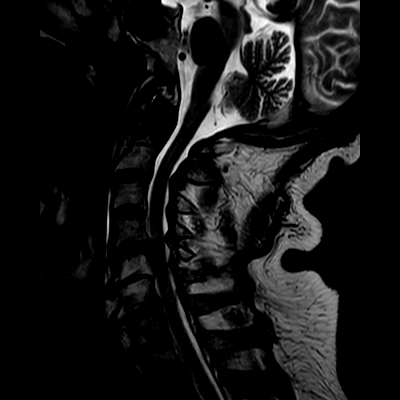
[im 11/15]
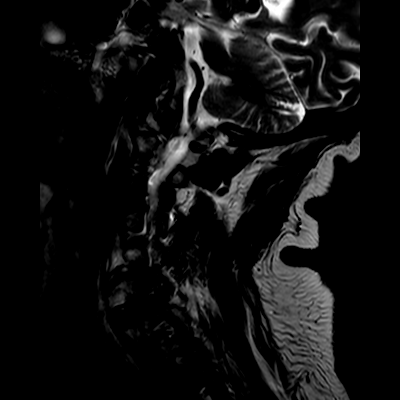
[im 15/15]
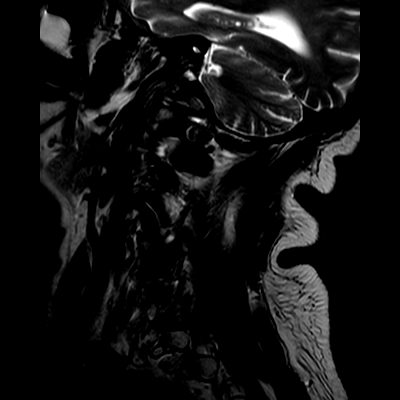

[Series 501: stir_longte sag · sagittal · 3.0mm · 0.62mm/px · 5 of 15 slices shown]
[im 1/15]
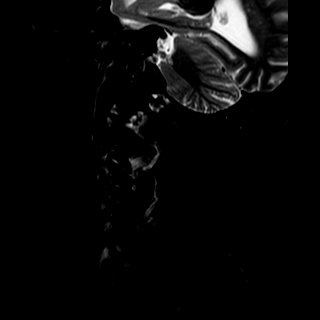
[im 4/15]
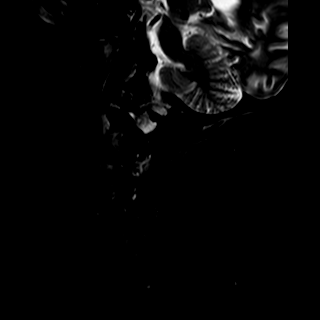
[im 8/15]
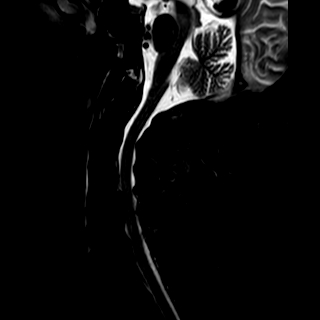
[im 11/15]
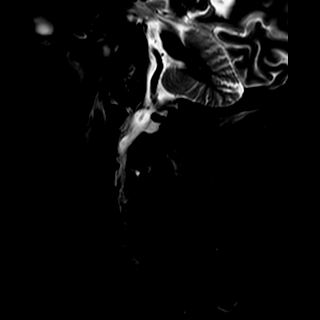
[im 15/15]
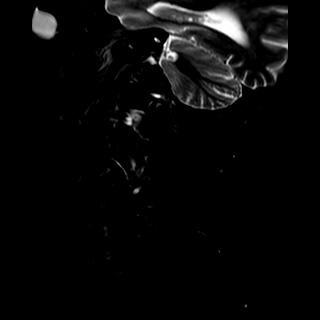

[Series 601: (id) ax-2 stacks · oblique · 3.0mm · 0.35mm/px · 8 of 45 slices shown (1 of 2)]
[im 1/45]
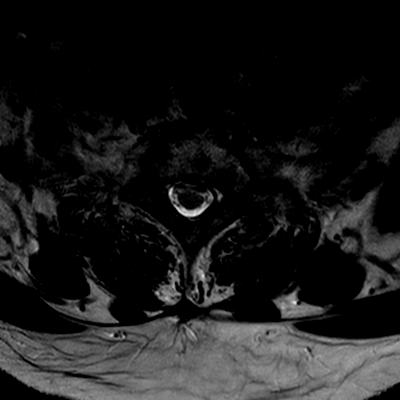
[im 7/45]
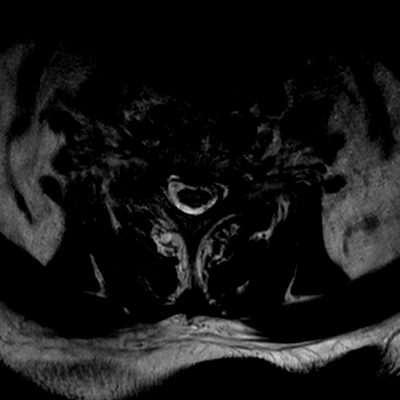
[im 13/45]
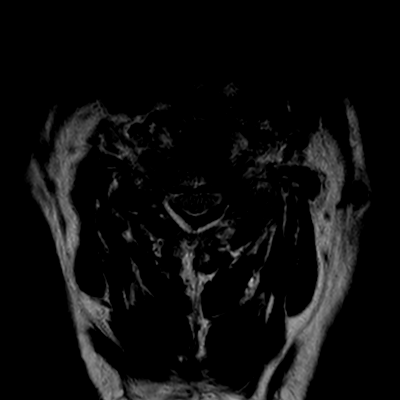
[im 19/45]
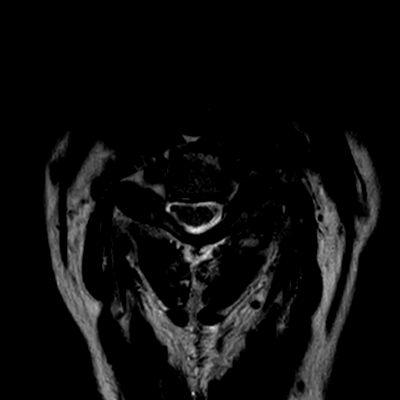
[im 26/45]
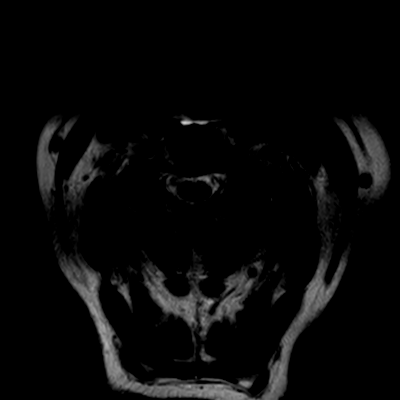
[im 32/45]
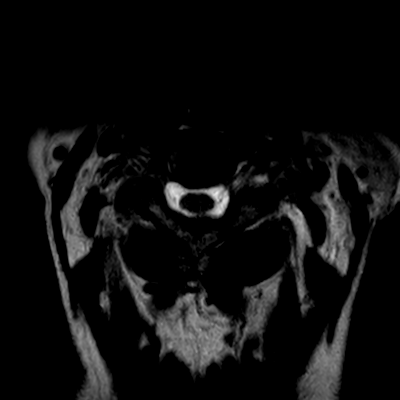
[im 38/45]
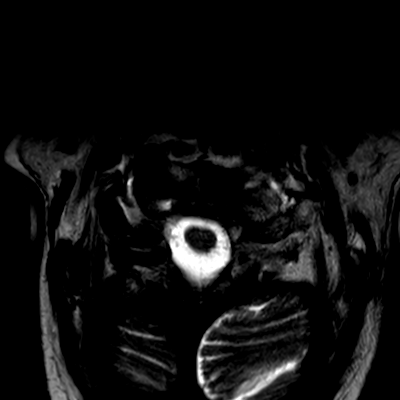
[im 45/45]
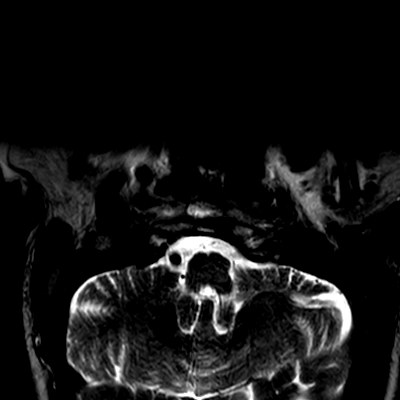

[Series 701: (id) ax-2 stacks · oblique · 3.0mm · 0.35mm/px · 2 of 45 slices shown (2 of 2)]
[im 1/45]
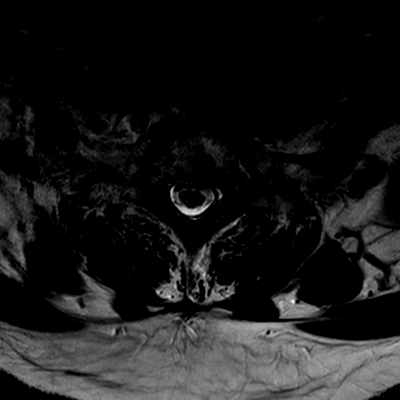
[im 7/45]
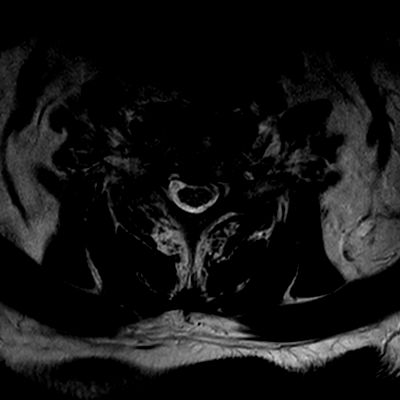

[28 of 48 positions shown; findings below may reference images not displayed]

FINDINGS: No significant cervical spine alignment abnormalities are detected on sagittal 
images. No suspicious bone marrow abnormalities are detected in the cervical 
spine. Benign marrow changes are incidentally noted. 
Minimal joint effusions are at the cranial cervical junction's which are 
otherwise unremarkable. 
There are mild to moderate degenerative changes involving the central C1-2 
articulation as evidenced by dense sclerosis. Small bilateral joint effusions 
are present in the lateral C1-2 articulations. There is no central spinal canal 
stenosis at this level. 
At C2-3 the disc is normal in height. Minimal posterior disc bulging is present. 
Moderate to severe degenerative changes involve the facet joints. Ligamentum 
flavum thickening and buckling is present. There is mild central canal stenosis 
with AP dimension of the canal reduced to 8 mm. No cord compromise is present. 
There is no significant foraminal narrowing. 
At C3-4 the disc retains normal height. There is minor posterior spondylosis. 
Mild dorsal ligamentum flavum thickening and buckling is present. There is 
minimal AP stenosis with the midline AP dimension of the canal reduced to 9 mm. 
Moderate to severe degenerative changes are in the bilateral facet joints with 
small bilateral facet joint effusions. Moderate bilateral foraminal narrowing is 
present. 
The C4-5 disc shows mild loss of height. There is mild to moderate diffuse 
posterior disc bulging with a small posterocentral protrusion. Dorsal ligamentum 
flavum thickening and buckling is present. Moderate central spinal canal 
stenosis is produced with midline AP dimension of the spinal canal reduced to 6 
mm. There is anterior and posterior cord contact and mild to moderate degrees of 
overall cord flattening without visible myelomalacia. Severe degenerative 
changes are in the LEFT facet joint with mild changes in the RIGHT facet joint. 
There is a small LEFT facet joint effusion. There is mild narrowing of the RIGHT 
foramen and moderate narrowing of the LEFT foramen. 
At C5-6 the disc exhibits severe degenerative loss of height. There is 
moderately prominent anterior spondylosis. Minor posterior spondylosis is 
present. There is dorsal ligamentum flavum thickening and buckling. AP dimension 
of the canal is reduced to 7.5 mm indicating mild to moderate central canal 
stenosis. There is anterior and posterior cord contact with minor cord 
flattening. Prominent bilateral uncovertebral hypertrophy is present. Mild 
degenerative changes are in the facet joints. Severe RIGHT foraminal narrowing 
and moderate to severe LEFT foraminal narrowing is present. 
At C6-7 the disc is severely narrowed. There is moderately prominent anterior 
spondylosis. Mild diffuse posterior spondylosis is present. There is minimal 
central canal stenosis with AP dimension of the canal at 9 mm. No cord 
pathology. There is asymmetric RIGHT-sided uncovertebral spurring. Mild 
degenerative changes are in the facet joints. Mild to moderate bilateral 
foraminal narrowing is present. 
At C7-T1 there is severe disc space narrowing with mild anterior spondylosis. 
Minor posterior spondylosis and disc bulging is present moderate degenerative 
changes are in the RIGHT facet joint with mild degenerative changes in the LEFT 
facet joint. No significant central canal stenosis. Right foramen is widely 
patent. There is moderate LEFT foraminal stenosis. 
At T1-2 there is a small posterocentral disc protrusion without significant 
central stenosis. Mild to moderate degenerative changes are in the facet joints 
with out significant foraminal narrowing. 
Except for what was described above the visible spinal cord is normal. No 
myelomalacia changes are found. Incidentally included portions of the brain show 
absent distal LEFT vertebral artery and the foramen magnum, possibly a normal 
variant.
IMPRESSION: Degenerative changes in the disc spaces and facet joints as described in detail 
above with multilevel central spinal canal stenosis most notably at C4-5 where 
there is moderate overall central canal stenosis with mild cord flattening 
without myelomalacia change. Potentially symptomatic degrees of foraminal 
narrowing are present as described above. Regarding the potential RIGHT 
radiculopathy the most significant finding is the severe RIGHT foraminal 
stenosis at C5-6. Please correlate with dermatomal distribution of RIGHT upper 
extremity radiculopathy.

## 2021-02-11 IMAGING — MG MAMMOGRAPHY DIAGNOSTIC RIGHT 3D TOMOSYNTHESIS WITH CAD
4 series · 4 of 12 positions shown · non-contrast
Comparison: Comparison was made to prior examinations.

MAMMOGRAPHY DIAGNOSTIC RIGHT 3D TOMOSYNTHESIS WITH CAD, 02/11/2021 [DATE]: 
CLINICAL INDICATION: Personal history of right-sided breast cancer status post 
lumpectomy and radiation therapy and June 2019, surveillance
TECHNIQUE: Unilateral right breast digital mammography and 3-D Tomosynthesis 
were obtained. In addition, computer-aided detection was utilized. 
BREAST DENSITY: (Level C) The breasts are heterogeneously dense, which may 
obscure small masses.

[R CC]
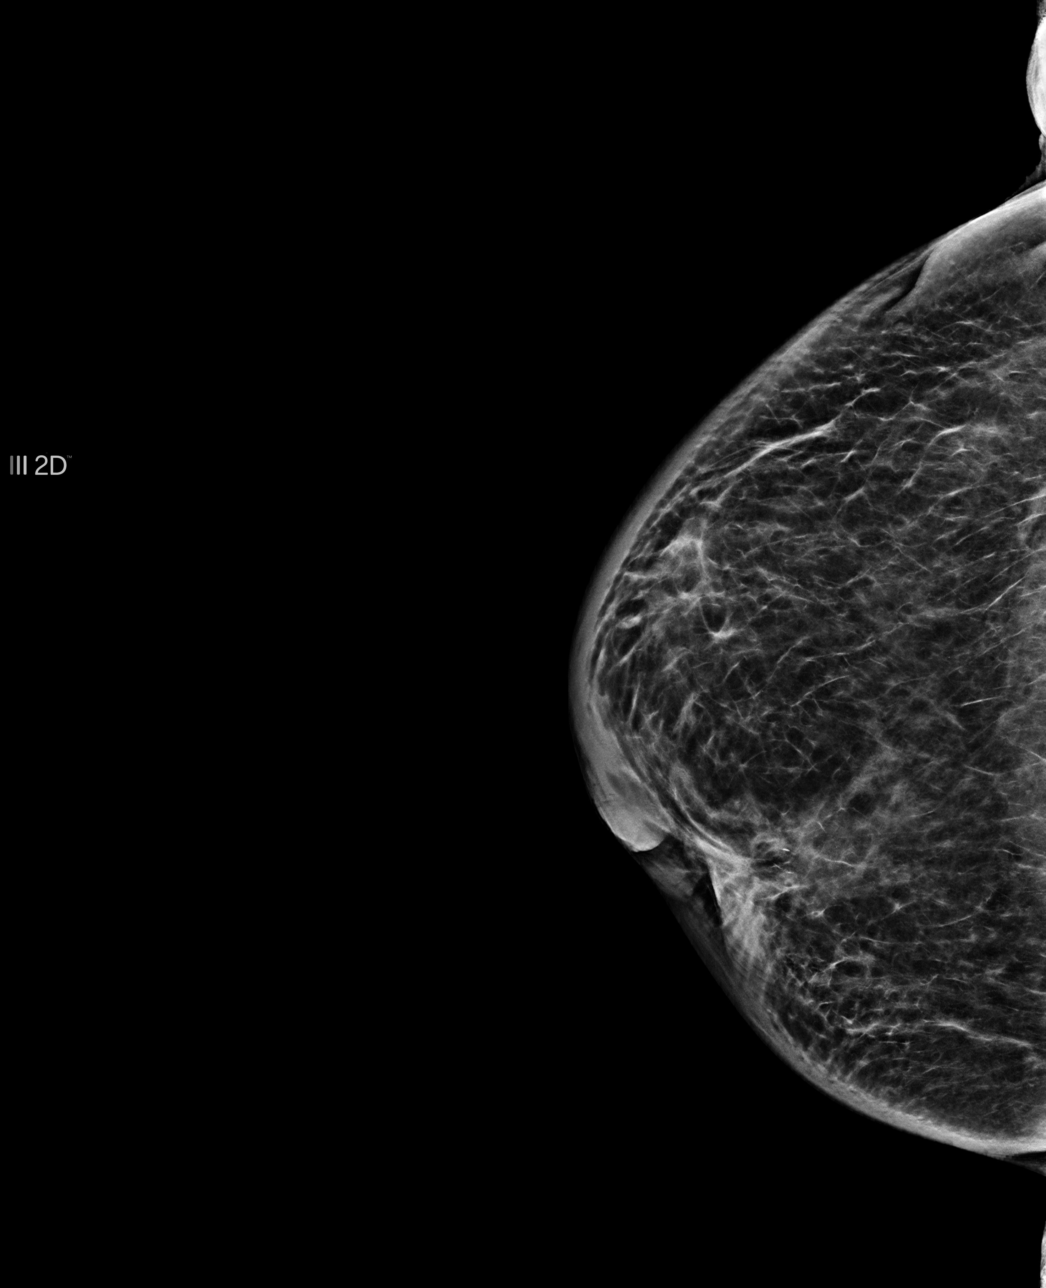

[R MLO]
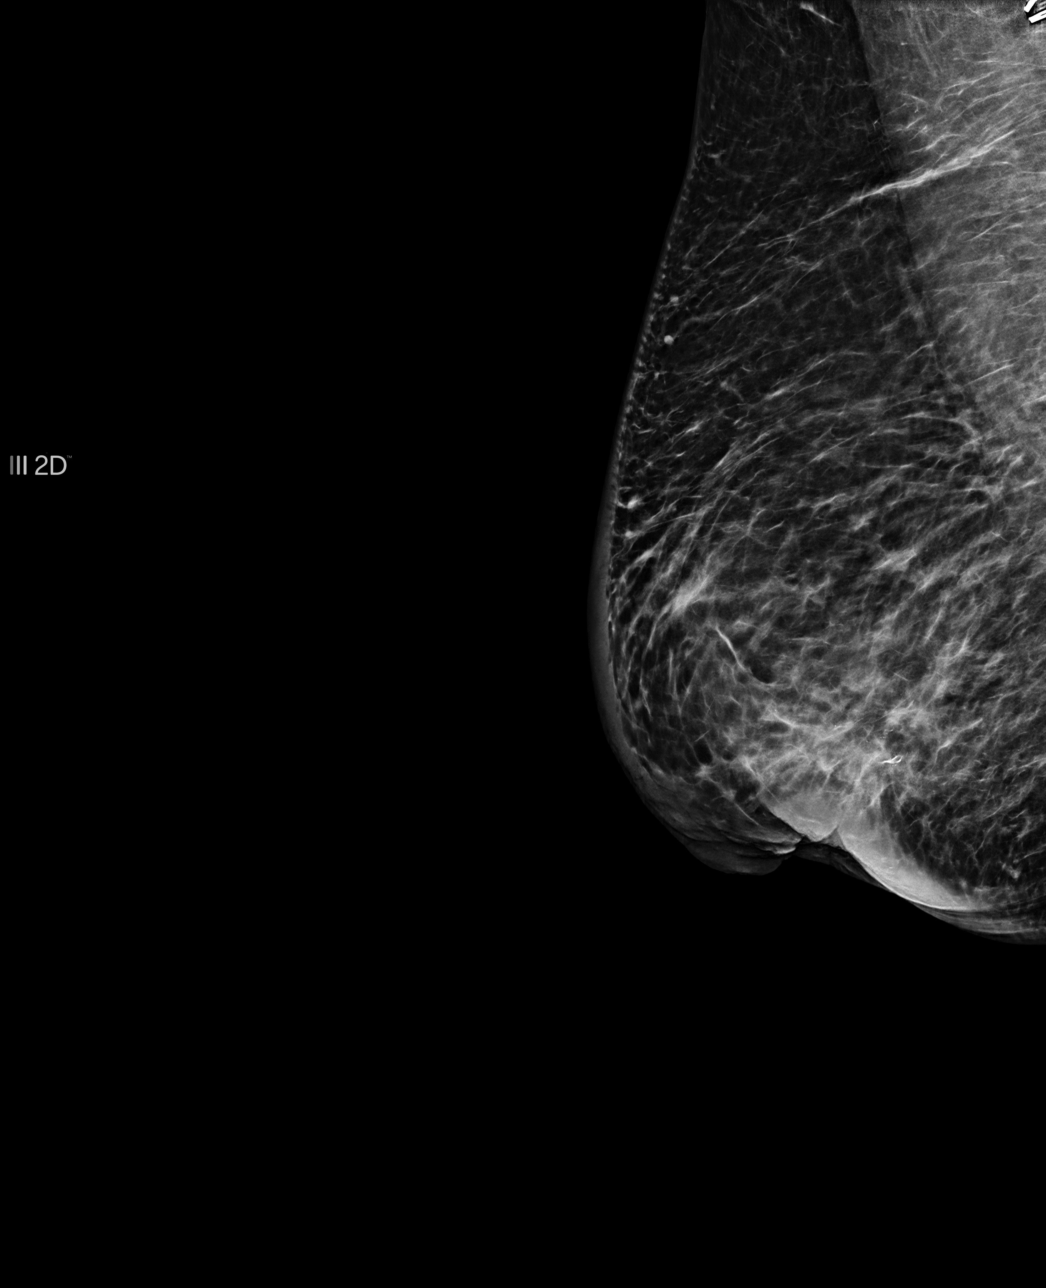

[R CC tomo · tomo slice 37/72.0]
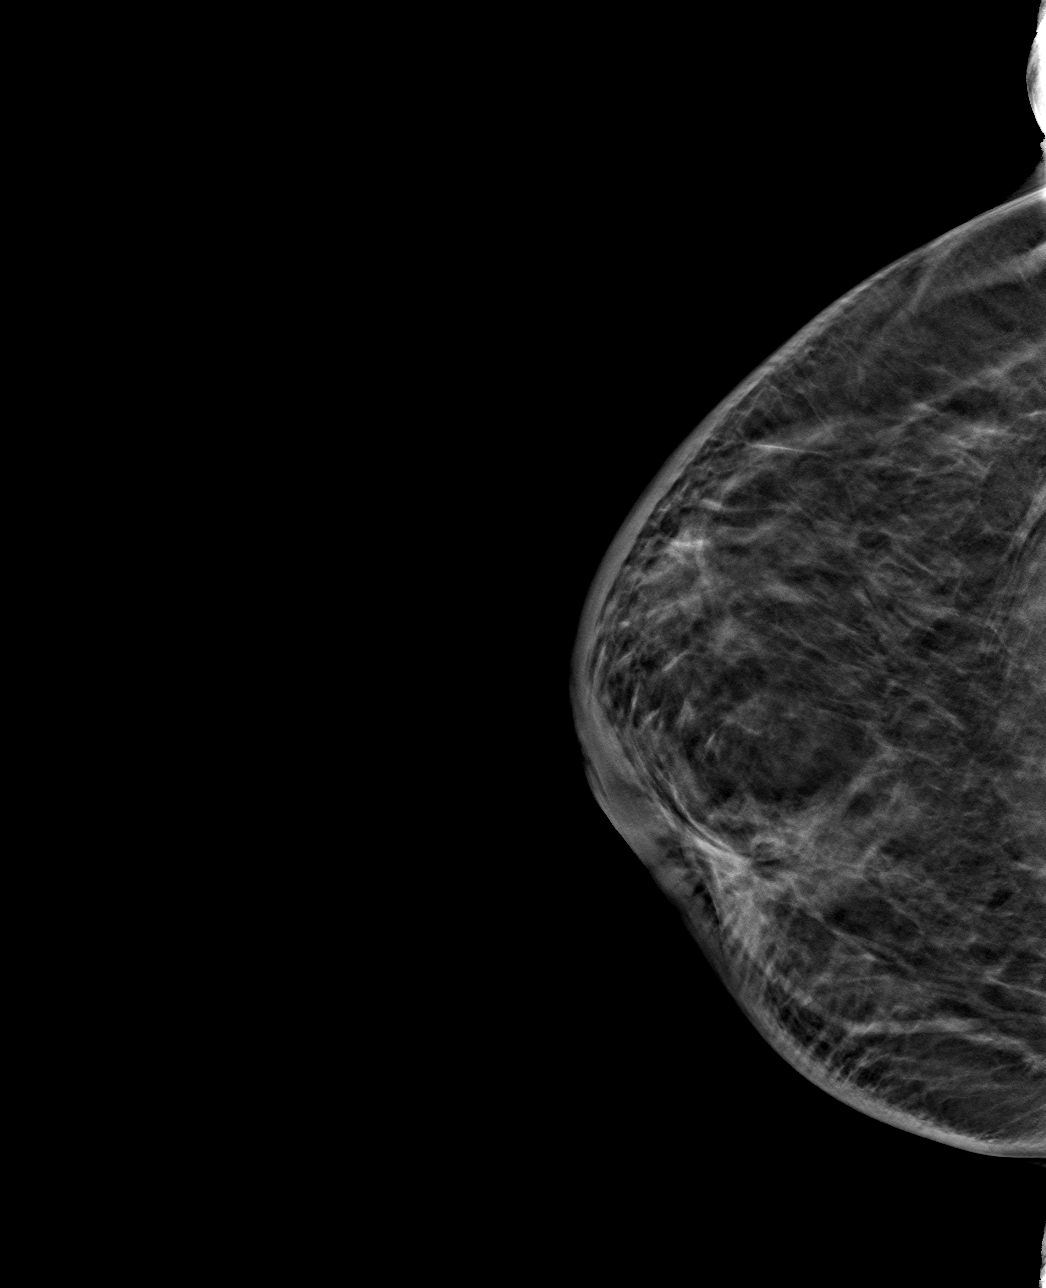

[R MLO tomo · tomo slice 39/76.0]
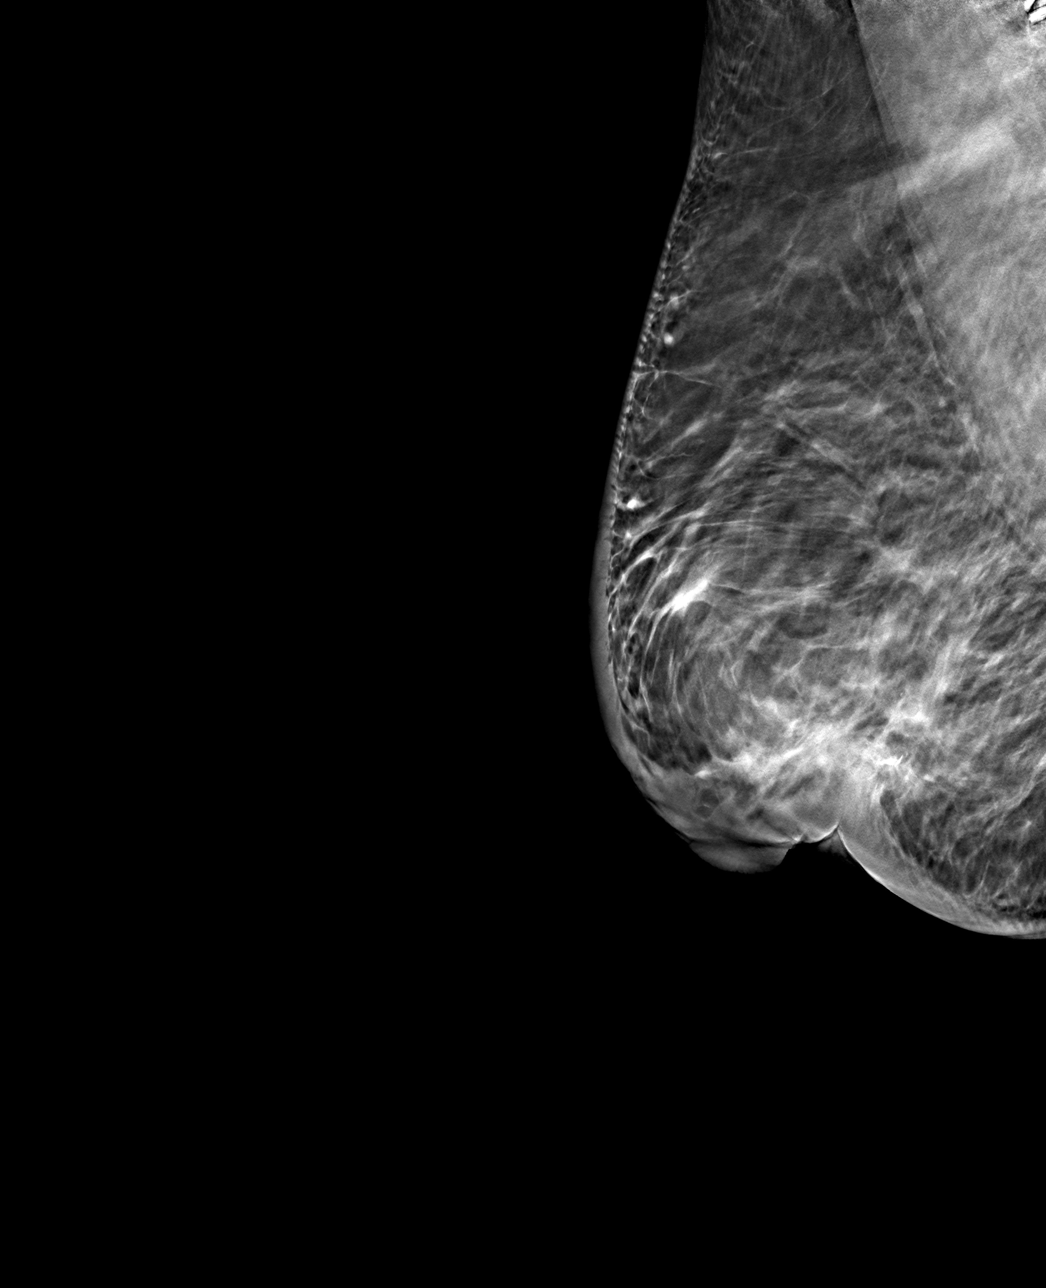

[4 of 12 positions shown; findings below may reference images not displayed]

FINDINGS: There are stable appearing lumpectomy changes involving the right 
breast. There is stable skin and trabecular thickening favored to reflect 
patient's history of prior radiation therapy. No suspicious mass, 
calcifications, or area of architectural distortion in the right
IMPRESSION: No mammographic evidence of malignancy in the  right 
( BI-RADS 2) Benign findings. Routine mammographic follow-up is recommended.

## 2021-07-05 IMAGING — MG MAMMOGRAPHY DIAGNOSTIC BILATERAL 3[PERSON_NAME]
8 series · 8 of 24 positions shown · non-contrast
Comparison: 02/11/2021 and dating back to 07/08/2019.

FINAL Diagnostic Imaging Report 
________________________________________________________________________________________________ 
MAMMOGRAPHY DIAGNOSTIC BILATERAL 3LAURY DIMAS, 07/05/2021 [DATE]: 
CLINICAL INDICATION: History of right breast cancer with lumpectomy and 
radiation therapy in 9595. No current concerns.
TECHNIQUE: Digital bilateral mammograms and 3-D Tomosynthesis were obtained. 
These were interpreted both primarily and with the aid of computer-aided 
detection system.  
DENSITY: BREAST DENSITY: (Level C) The breasts are heterogeneously dense, which 
may obscure small masses.

[R MLO]
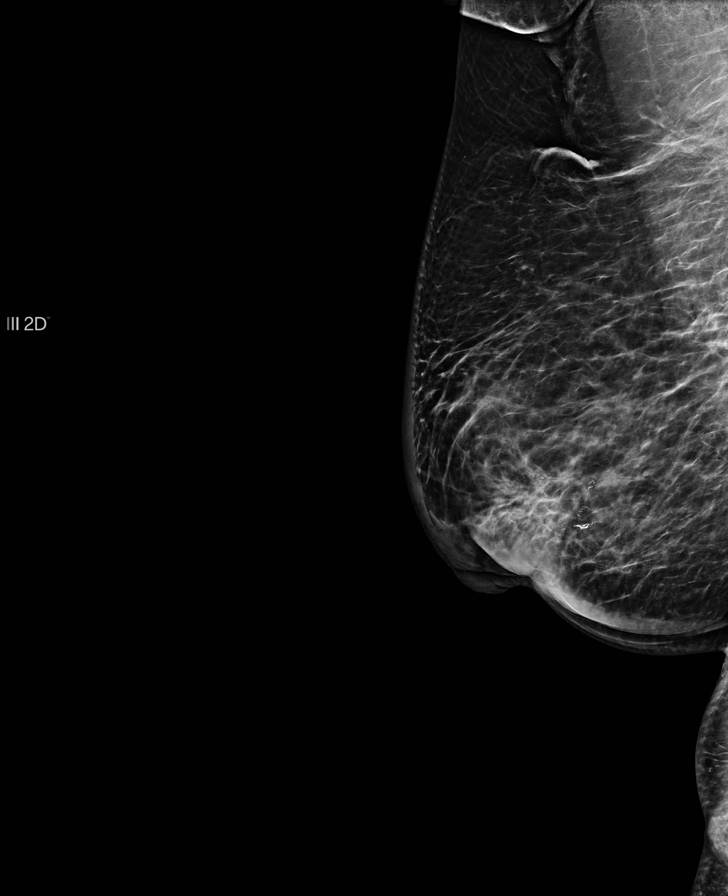

[L MLO]
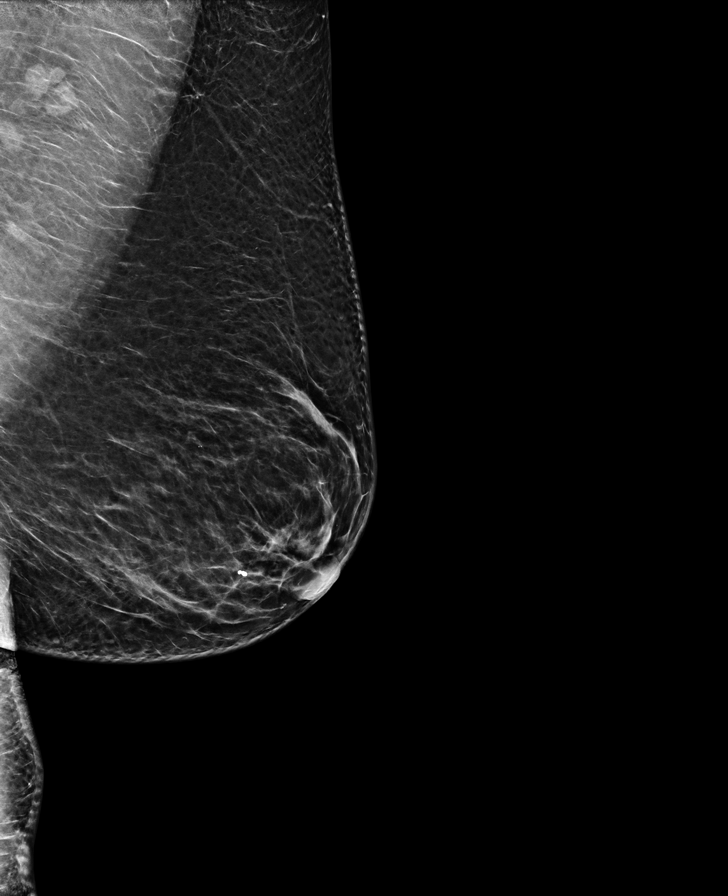

[L CC]
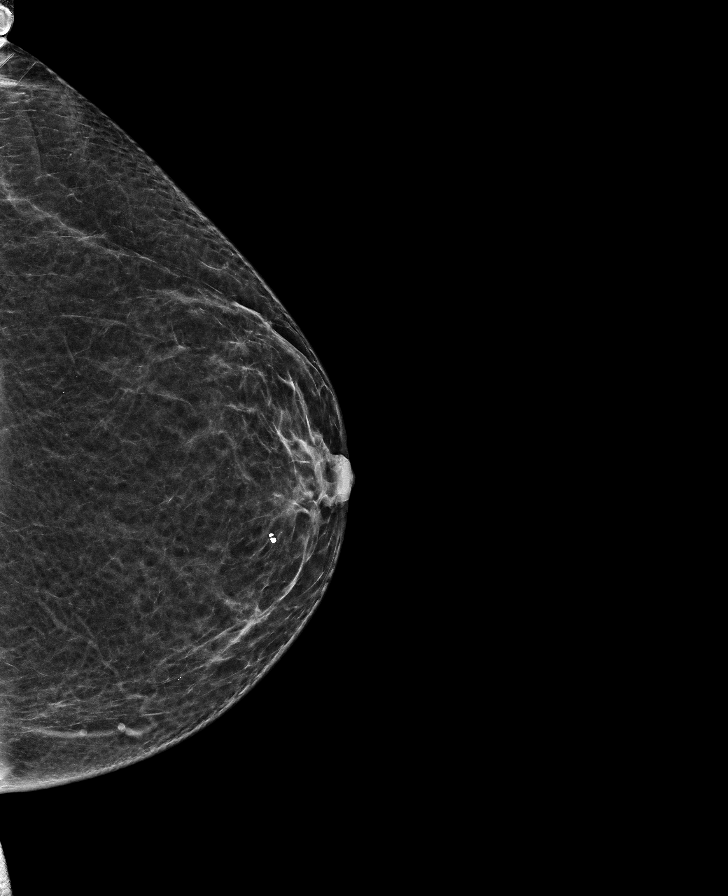

[R CC]
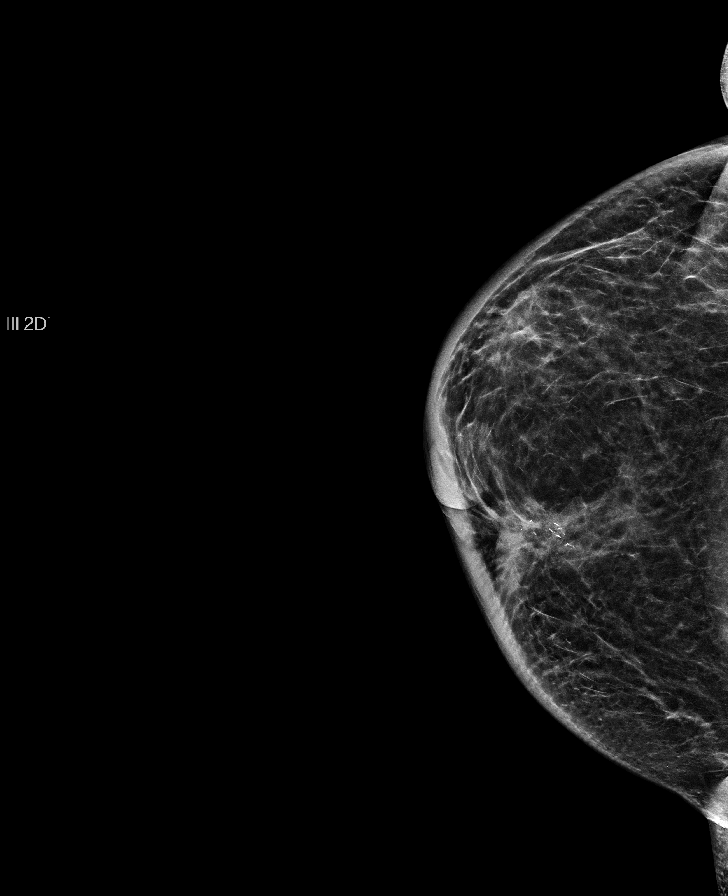

[L MLO tomo · tomo slice 33/65.0]
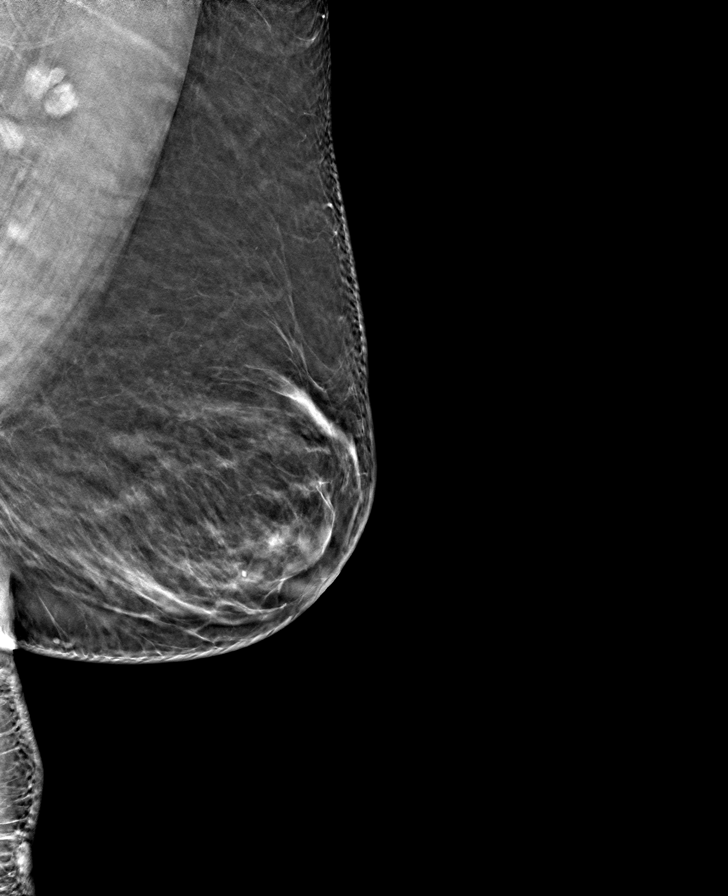

[R CC tomo · tomo slice 33/66.0]
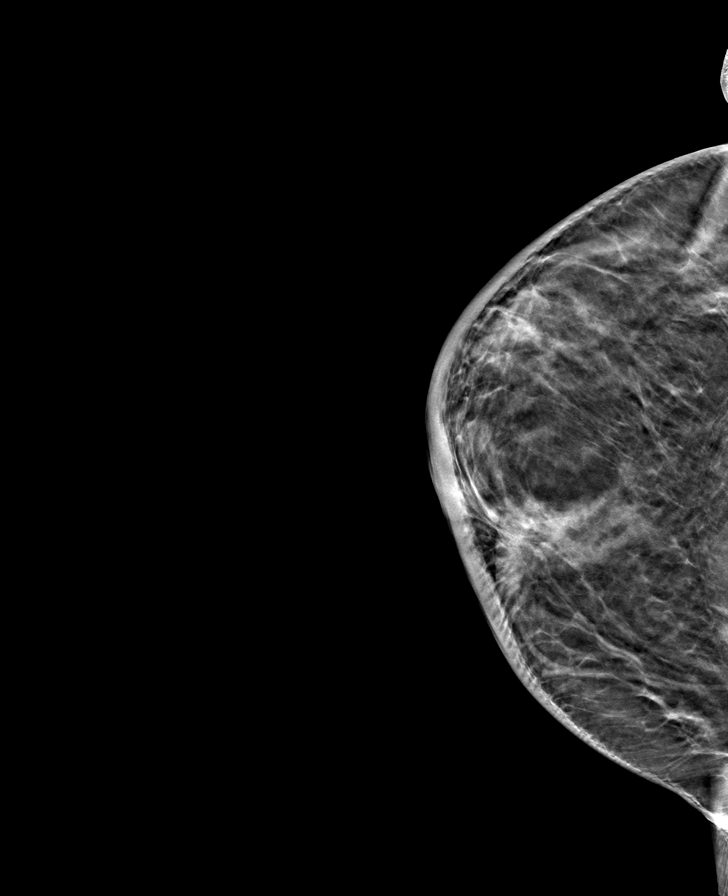

[L CC tomo · tomo slice 27/54.0]
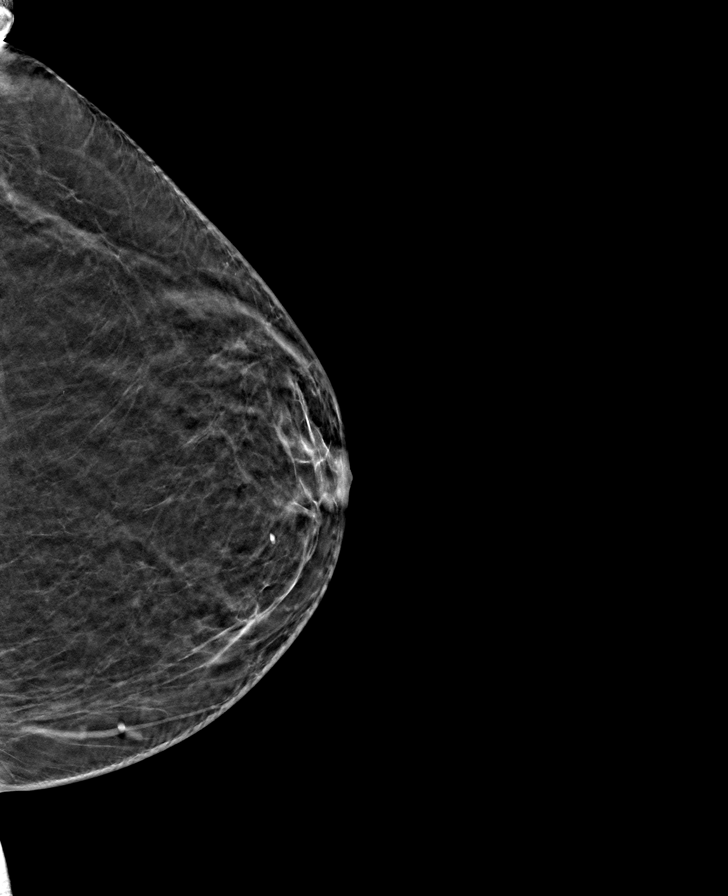

[R MLO tomo · tomo slice 37/73.0]
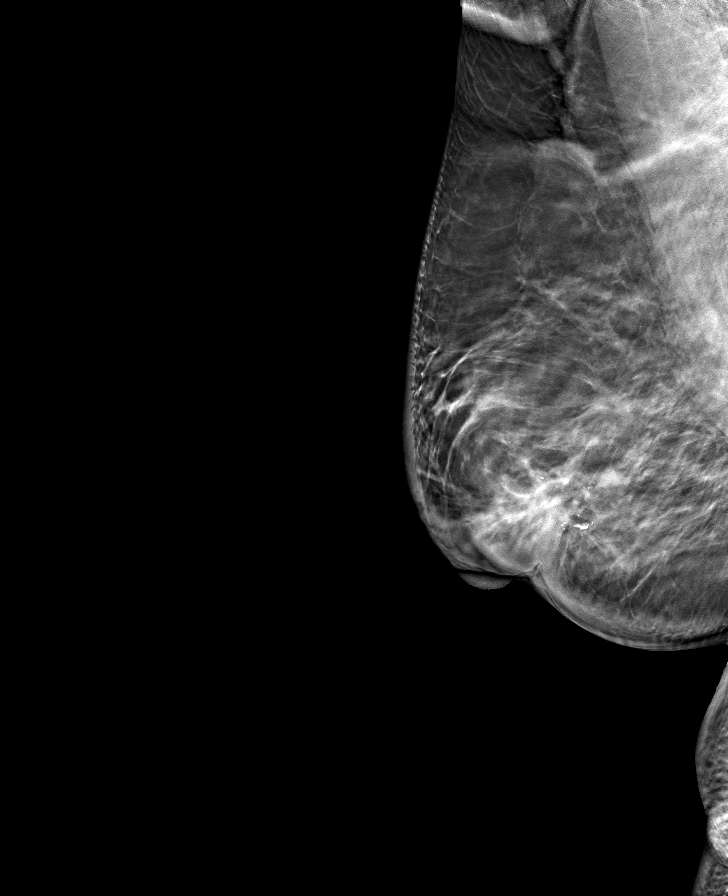

[8 of 24 positions shown; findings below may reference images not displayed]

FINDINGS: Posttreatment changes right breast are again seen. Overall stable 
mammographic appearance of both breasts. No suspicious mass, calcifications, or 
architectural distortion in either breast.
IMPRESSION: No mammographic findings suggestive for malignancy. 
( BI-RADS 2) Benign findings. Routine mammographic follow-up is recommended.

## 2021-11-27 IMAGING — CT CT CHEST WITHOUT CONTRAST
2 of 3 series · 15 of 36 positions shown, 18 images · non-contrast
Comparison: No prior chest CT

________________________________________________________________________________________________ 
CT CHEST WITHOUT CONTRAST, 11/27/2021 [DATE]: 
CLINICAL INDICATION:  Persistent cough x4 days 
A search for DICOM formatted images was conducted for prior CT imaging studies 
completed at a non-affiliated media free facility.
TECHNIQUE: The chest was scanned from base of neck through the lung bases 
without contrast on a high resolution low dose CT scanner. Routine MPR and MIP 
3D renderings were reconstructed on an independent workstation with concurrent 
physician supervision.

[Series 2: chest 2.0 i31s 3 · axial · 0.81mm/px · z∈[-218,+58]mm · 12 of 163 slices shown, 15 images]
[im 13/163  mediastinal]
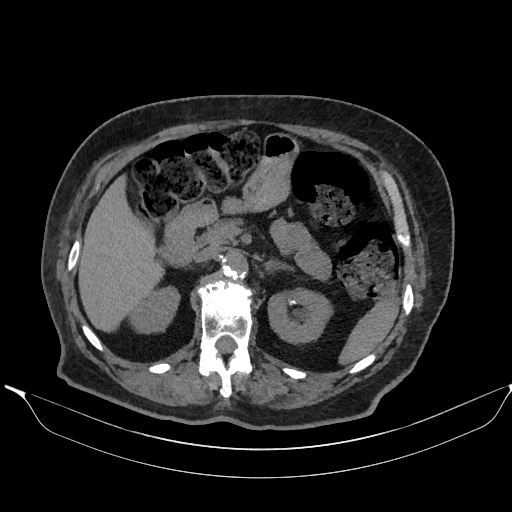
[im 13/163  lung]
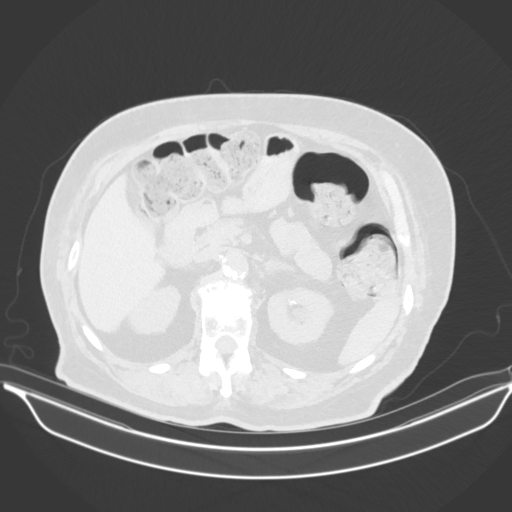
[im 25/163  lung]
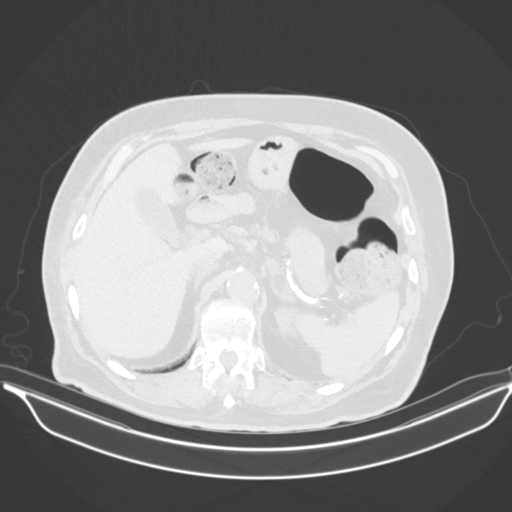
[im 37/163  lung]
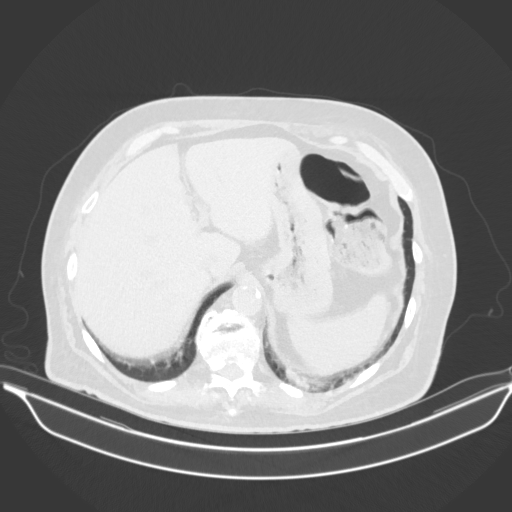
[im 49/163  lung]
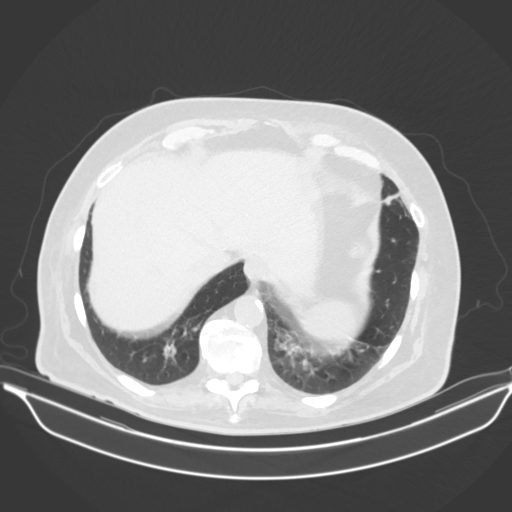
[im 61/163  mediastinal]
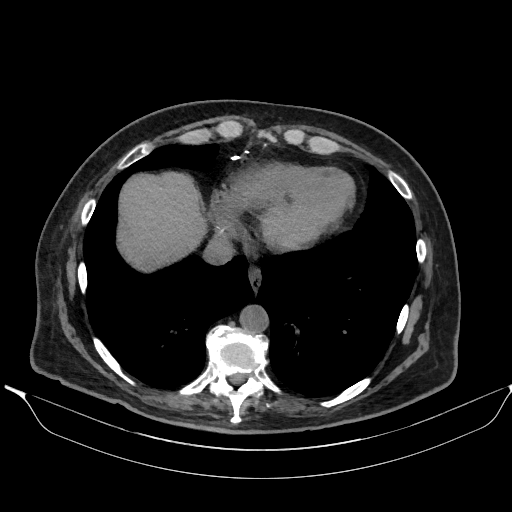
[im 61/163  lung]
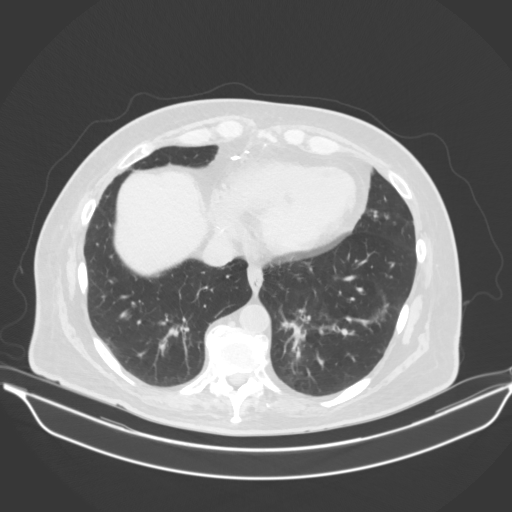
[im 73/163  lung]
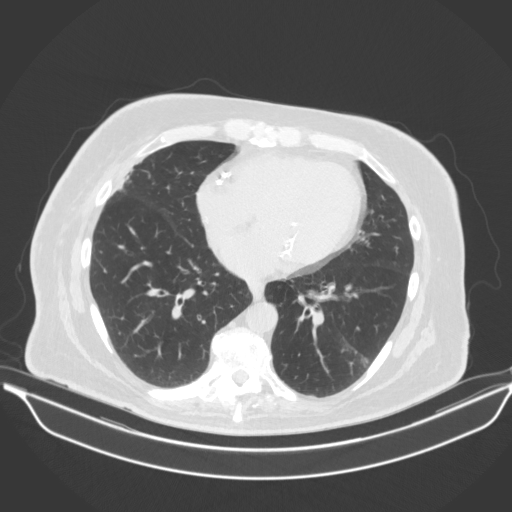
[im 91/163  lung]
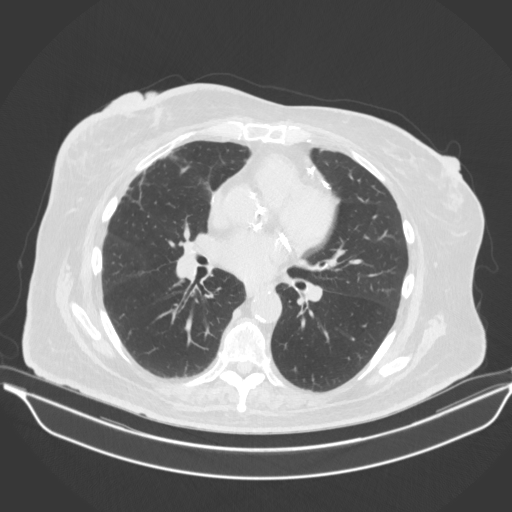
[im 103/163  lung]
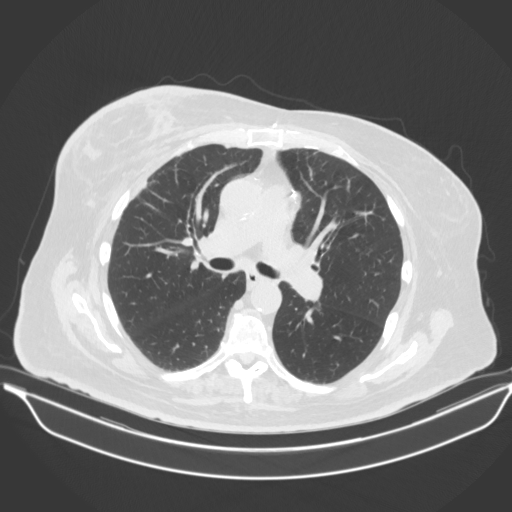
[im 115/163  mediastinal]
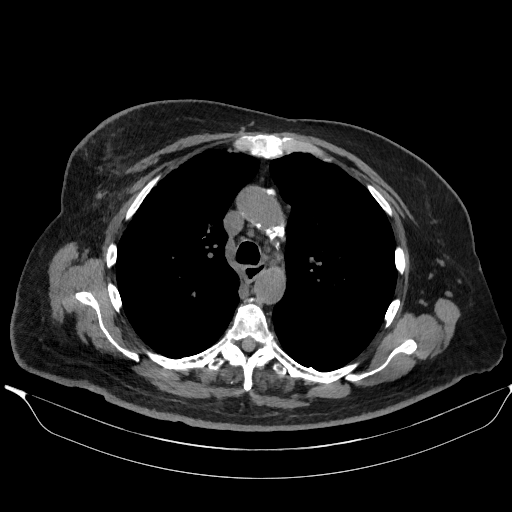
[im 115/163  lung]
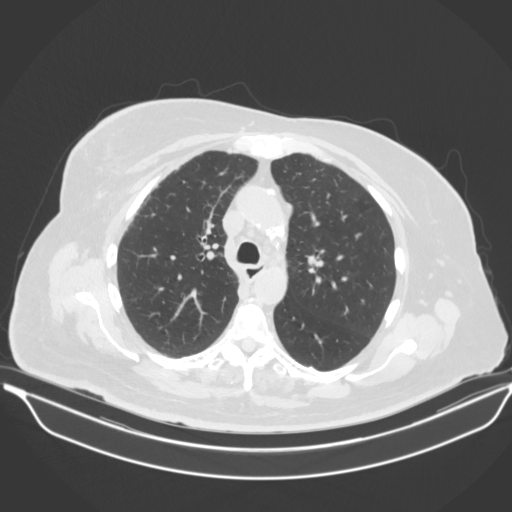
[im 127/163  lung]
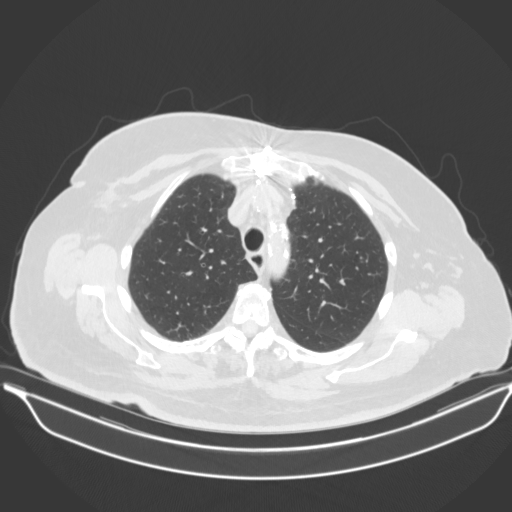
[im 139/163  lung]
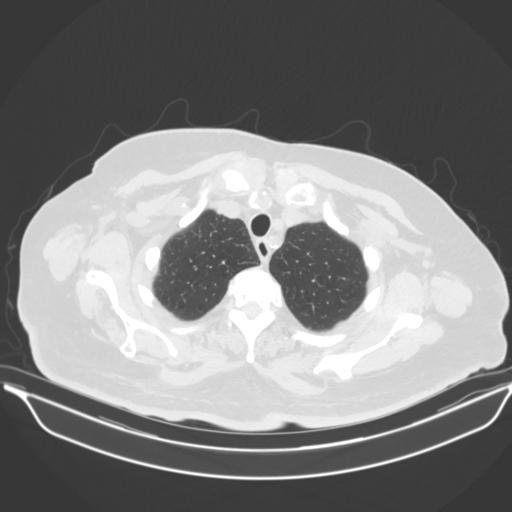
[im 151/163  lung]
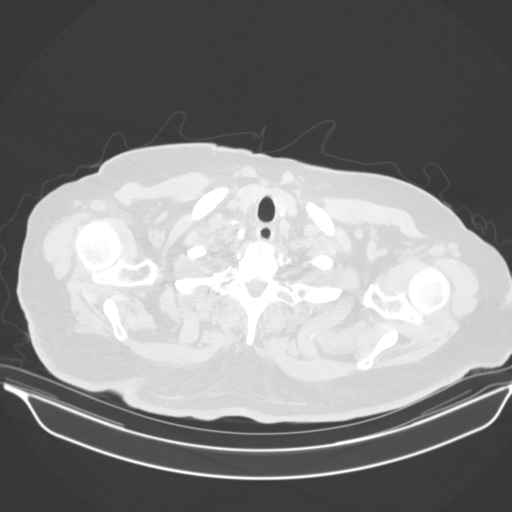

[Series 5: coronal · coronal · 0.64mm/px · 3 of 128 slices shown]
[im 26/128  lung]
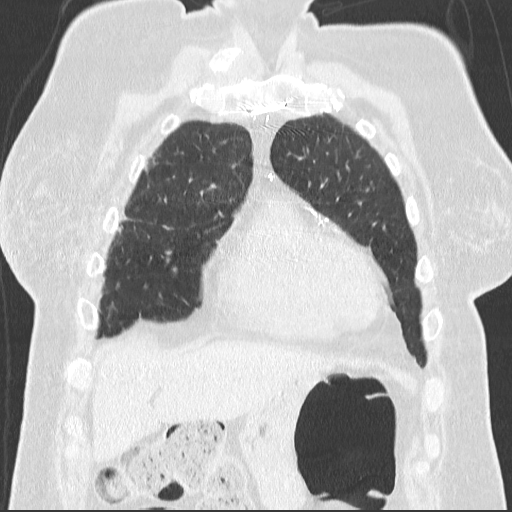
[im 51/128  lung]
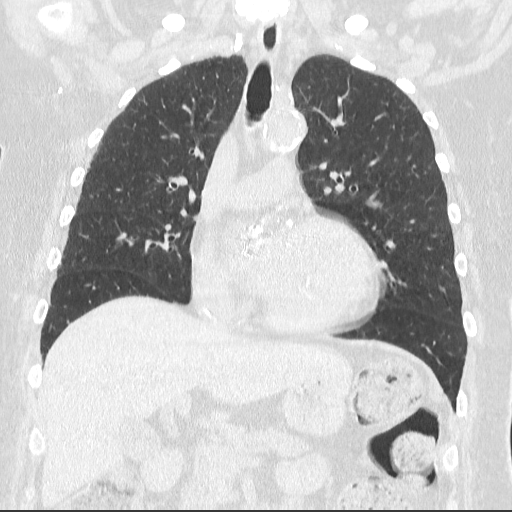
[im 77/128  lung]
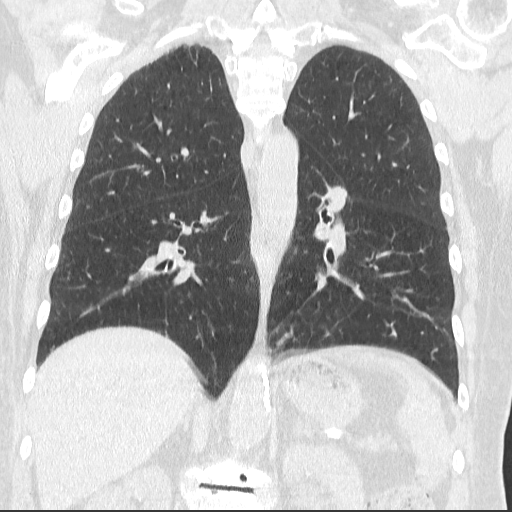

[15 of 36 positions shown; findings below may reference images not displayed]

FINDINGS: There is patchy infiltrate in the left lower lobe concerning for 
pneumonitis. There is bronchial wall thickening without bronchiectasis. No 
pleural effusion. 3 mm ill-defined nodule in the right upper lobe, likely 
inflammatory change. There has been sternotomy and ACB. Main pulmonary artery is 
nondilated. No adenopathy. There is a low-density focus in the left adrenal 
gland consistent with myelolipoma. The right adrenal gland is unremarkable.
IMPRESSION: Left lower lobe infiltrate consistent with pneumonitis. Findings relayed to 
referring clinician. 
RADIATION DOSE REDUCTION: All CT scans are performed using radiation dose 
reduction techniques, when applicable.  Technical factors are evaluated and 
adjusted to ensure appropriate moderation of exposure.  Automated dose 
management technology is applied to adjust the radiation doses to minimize 
exposure while achieving diagnostic quality images.

## 2021-11-28 IMAGING — MR MRI BRAIN WITHOUT CONTRAST
8 of 11 series · 24 of 48 positions shown · IV contrast (gadolinium)
Comparison: December 09, 2019 MRI of brain.

________________________________________________________________________________________________ 
MRI BRAIN WITHOUT CONTRAST, 11/28/2021 [DATE]: 
CLINICAL INDICATION: Unsteady gait with memory loss and repeated falls recently 
disorientation.
TECHNIQUE: Multiplanar, multiecho position MR images of the brain were performed 
without intravenous gadolinium enhancement. Patient was scanned on a
magnet.

[Series 202: mpr - smartbrain · axial · 1.1mm · 1.09mm/px · 1 of 2 slices shown]
[im 1/2]
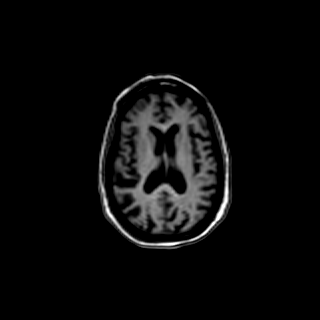

[Series 303: dadc map · axial · 5.0mm · 1.03mm/px · z∈[-75,+81]mm · 2 of 26 slices shown]
[im 1/26]
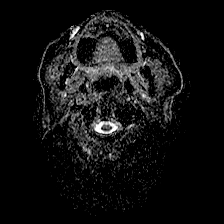
[im 26/26]
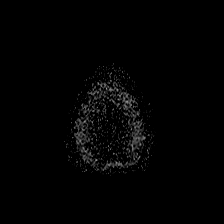

[Series 304: (id) · axial · 5.0mm · 1.03mm/px · z∈[-75,+81]mm · 2 of 27 slices shown]
[im 1/27]
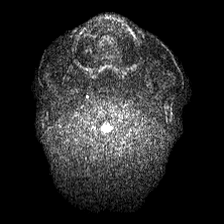
[im 27/27]
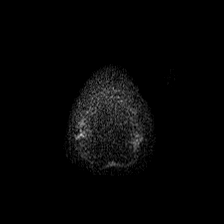

[Series 401: t1_se_sag · sagittal · 4.0mm · 0.43mm/px · 2 of 28 slices shown]
[im 1/28]
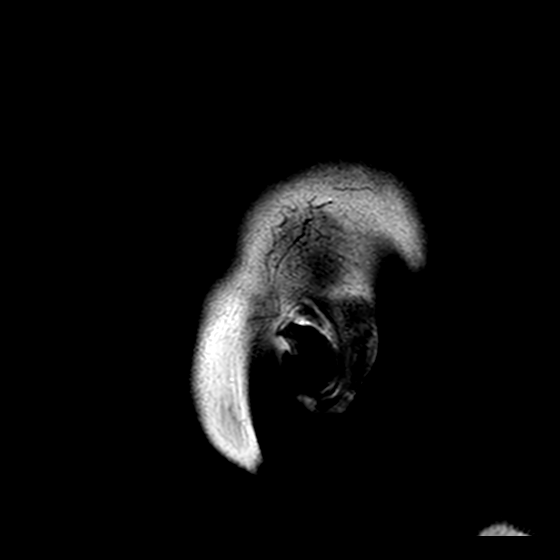
[im 28/28]
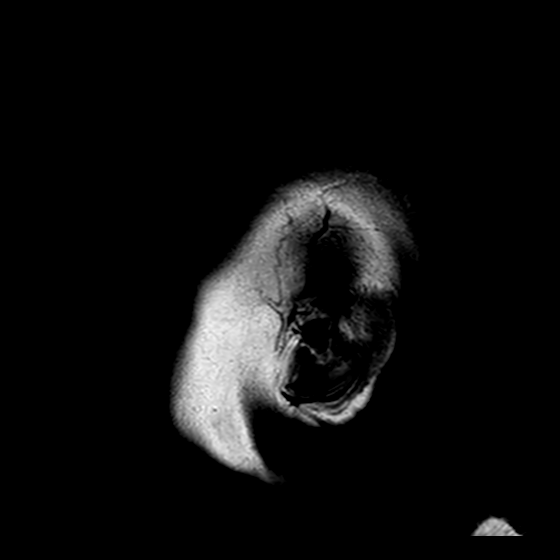

[Series 501: flair_ax · axial · 5.0mm · 0.49mm/px · z∈[-77,+79]mm · 2 of 27 slices shown]
[im 1/27]
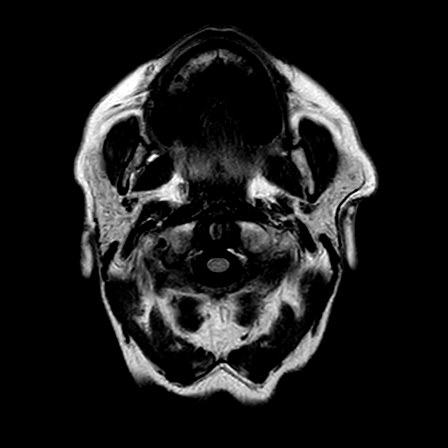
[im 27/27]
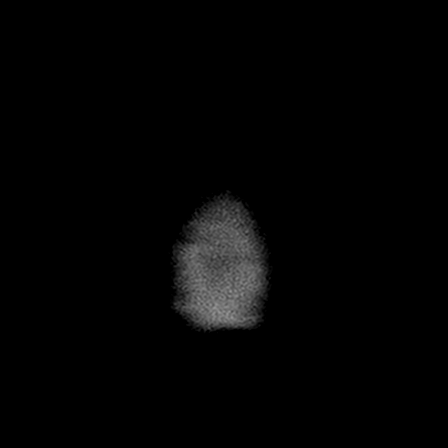

[Series 601: t2w_flair_mvxd · axial · 5.0mm · 0.76mm/px · z∈[-77,+79]mm · 3 of 27 slices shown]
[im 1/27]
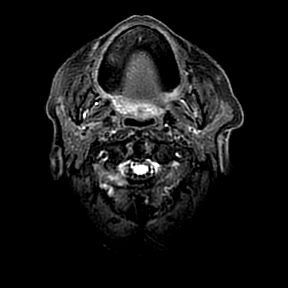
[im 14/27]
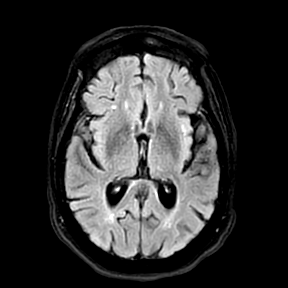
[im 27/27]
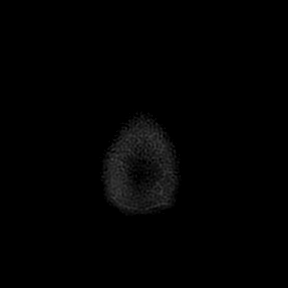

[Series 701: t2w_tse_mvxd · axial · 5.0mm · 0.69mm/px · z∈[-77,+79]mm · 3 of 27 slices shown]
[im 1/27]
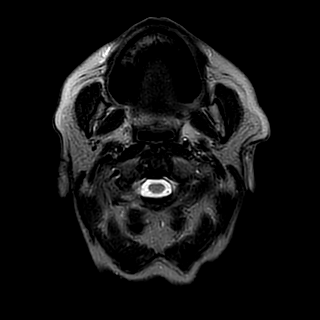
[im 14/27]
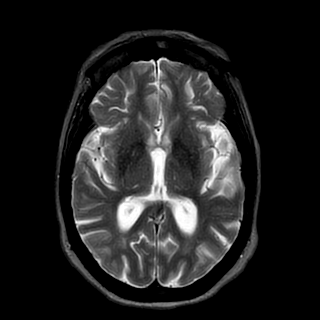
[im 27/27]
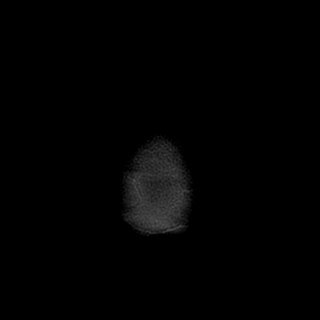

[Series 901: SWI · axial · 5.0mm · 0.40mm/px · z∈[-68,+79]mm · 9 of 120 slices shown]
[im 1/120]
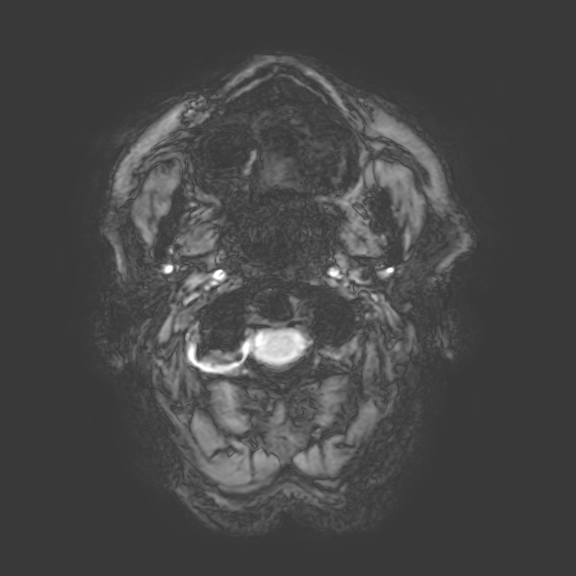
[im 22/120]
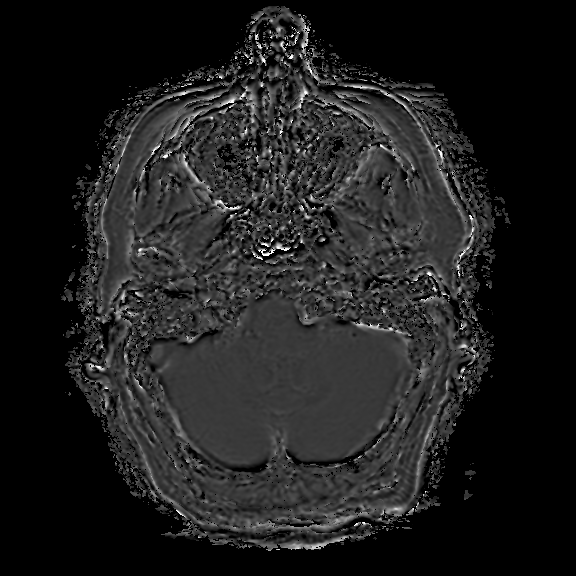
[im 33/120]
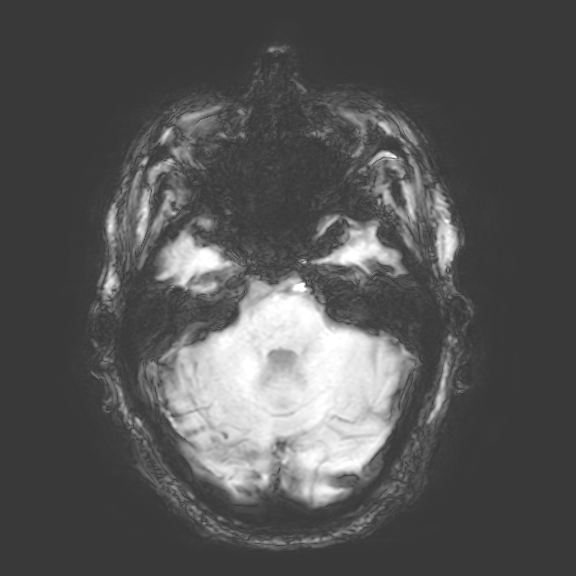
[im 55/120]
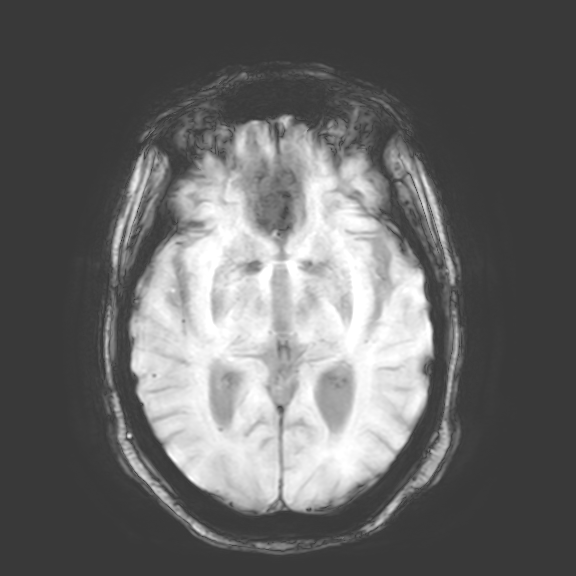
[im 65/120]
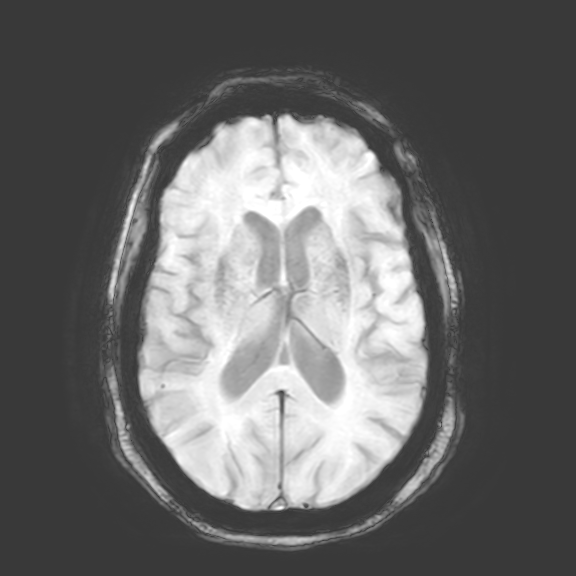
[im 87/120]
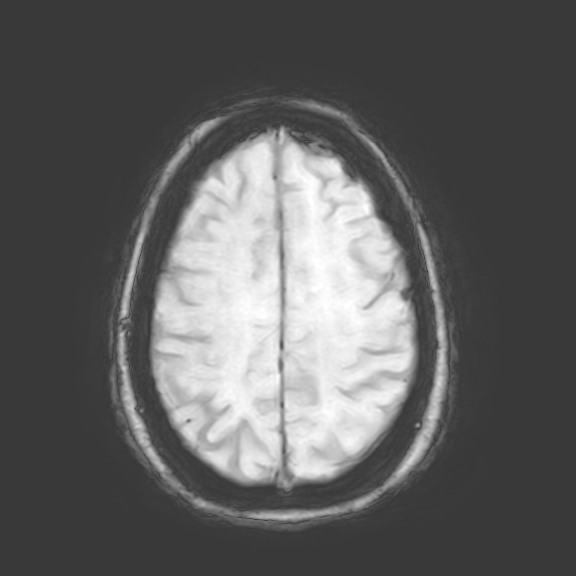
[im 98/120]
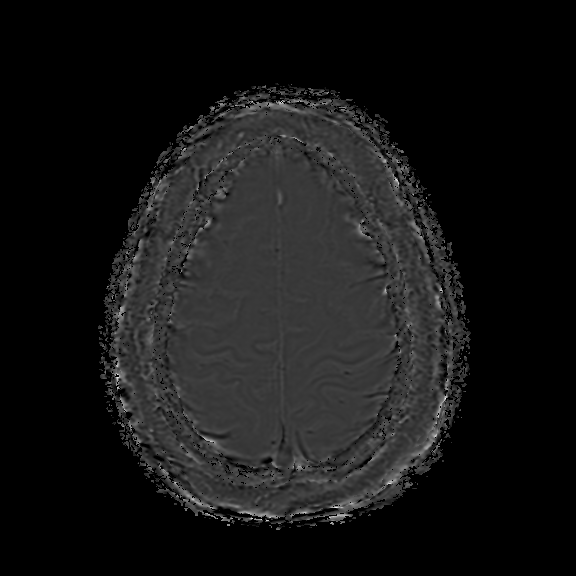
[im 109/120]
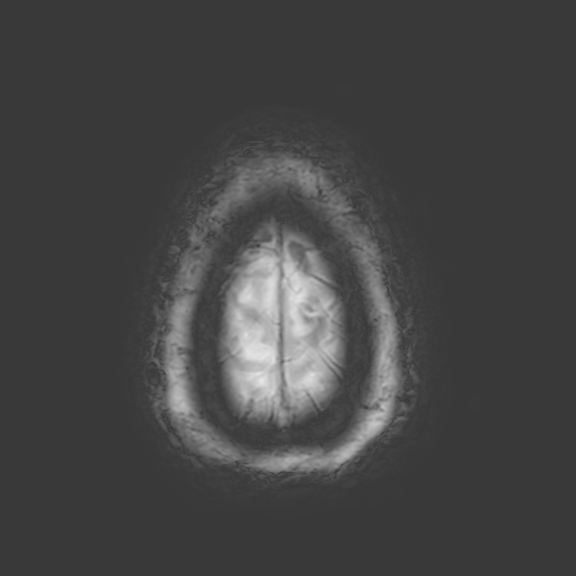
[im 120/120]
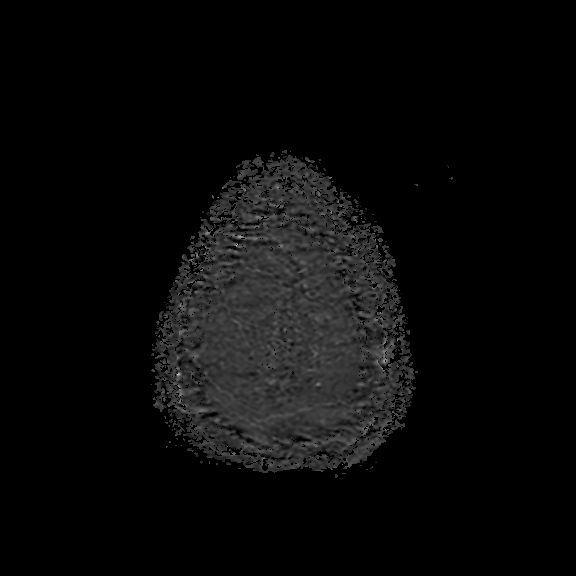

[24 of 48 positions shown; findings below may reference images not displayed]

FINDINGS: Diffusion sequences show no evidence for recent ischemic infarction. There is 
mild to moderate diffuse cerebral atrophy. Mild stable chronic microangiopathic 
changes are in the bilateral cerebral white matter. Normal flow void is in the 
major intracranial arteries. There is no visible distal LEFT vertebral artery 
which was present previously and felt to represent a normal variation in this 
patient. Again seen are numerous punctate foci of chronic hemosiderin deposition 
in the brain many of which are in the subcortical cerebral white matter. These 
are not significantly changed. Differential diagnosis include amyloid 
angiopathy, hypertensive encephalopathy and previous trauma. No intracranial 
mass effect or extra-axial fluid collections are present. No acute or recent 
appearing intracranial pathology is found. There is an incidental mucous 
retention cyst in the RIGHT maxillary sinus.
IMPRESSION: 1. No acute or recent appearing intracranial pathology and specifically no 
evidence for recent brain trauma or infarction. 
2. There is moderate diffuse cerebral atrophy and mild chronic microangiopathic 
cerebral white matter changes which are stable. 
3. There are numerous punctate hemosiderin foci in the brain which are 
unchanged. Differential diagnosis includes amyloid angiopathy, hypertensive 
encephalopathy and old closed head injury.

## 2022-02-21 IMAGING — CT PET CT SCAN TUMOR IMAGING SKULL TO THIGH  (FC
3 series · 25 of 25 positions shown · non-contrast
Comparison: CT scan November 27, 2021 and PET scan from July 2019

________________________________________________________________________________________________ 
PET CT SCAN TUMOR IMAGING SKULL TO THIGH  (FC, 02/21/2022 [DATE]: 
CLINICAL INDICATION:  Right breast carcinoma. Elevated tumor markers.
TECHNIQUE: A dose of 14.9 millicuries of 18-FDG was administered intravenously 
and skull to thigh PET scanning was performed at 56 minutes. Tomographic scans 
were reconstructed in axial, coronal, and sagittal projections. The data was 
reconstructed into a three-dimensional volume rendered image and reviewed in a 
rotational cine loop. Serum blood glucose at the time of injection was 88 mg/dl.

[Series 3: pet ac · axial · 4.0mm · 4.07mm/px · z∈[-980,-104]mm · 9 of 220 slices shown]
[im 1/220]
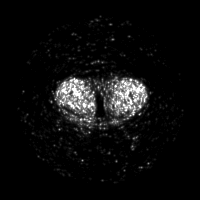
[im 28/220]
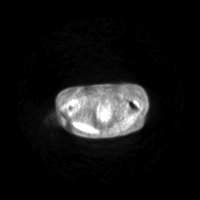
[im 55/220]
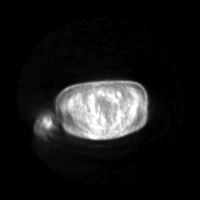
[im 83/220]
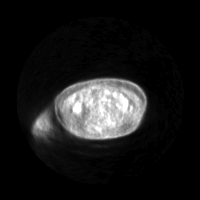
[im 110/220]
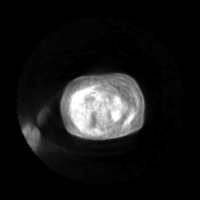
[im 137/220]
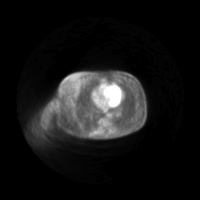
[im 165/220]
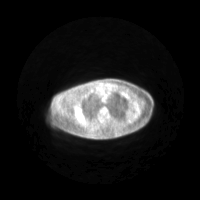
[im 192/220]
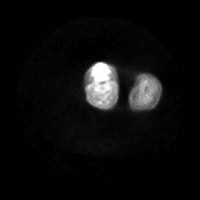
[im 220/220]
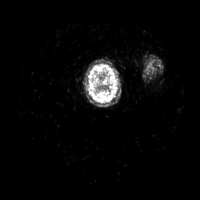

[Series 4: ct wb · axial · 4.0mm · 0.98mm/px · z∈[-980,-104]mm · 8 of 220 slices shown]
[im 1/220]
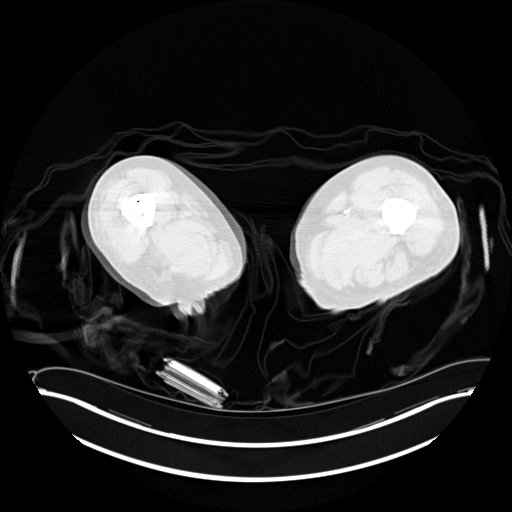
[im 32/220]
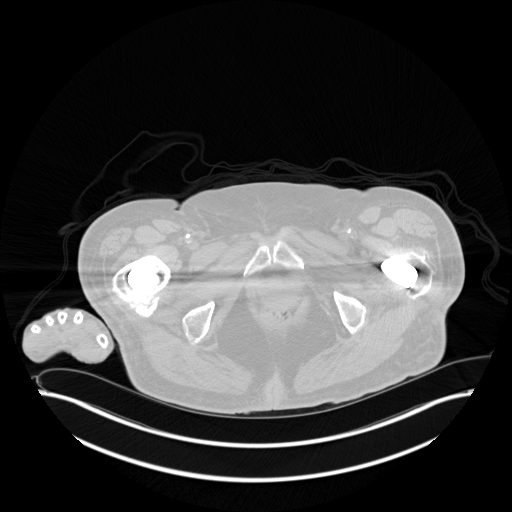
[im 63/220]
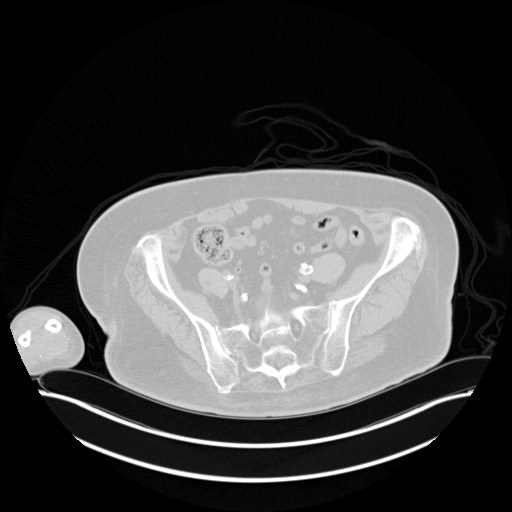
[im 94/220]
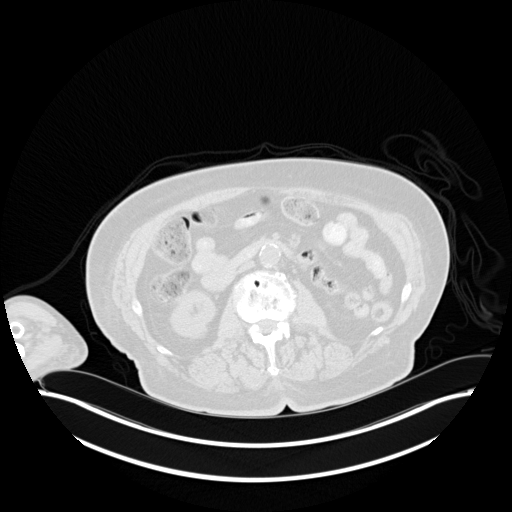
[im 126/220]
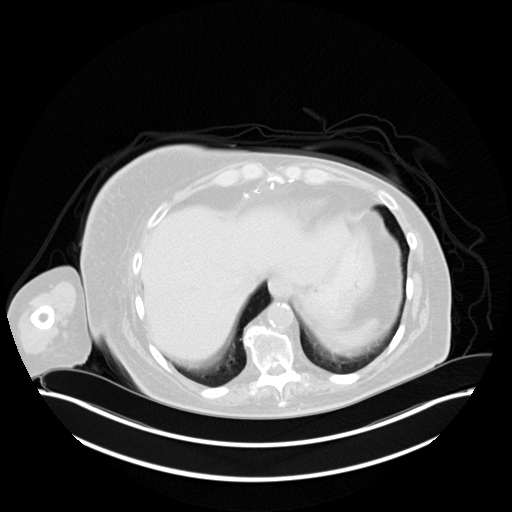
[im 157/220]
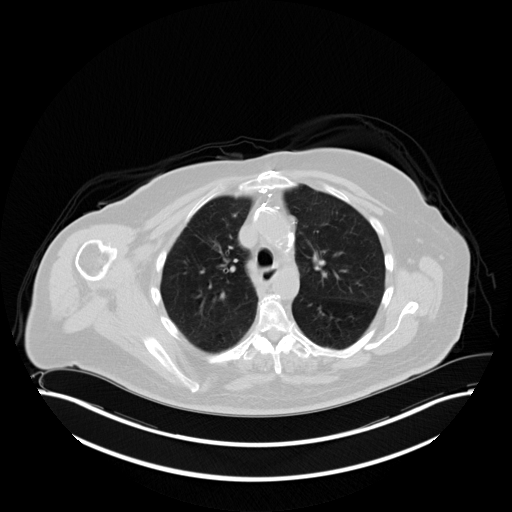
[im 188/220]
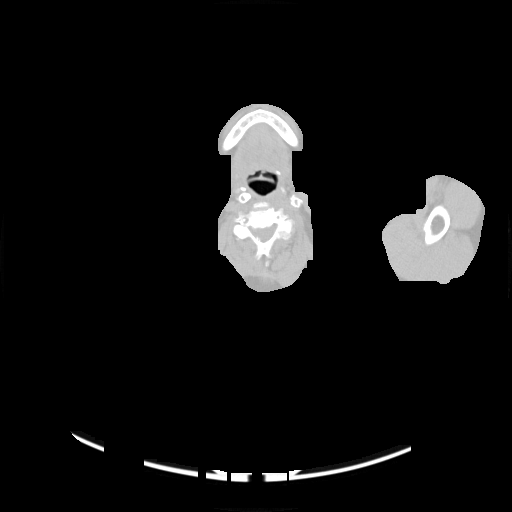
[im 220/220  brain]
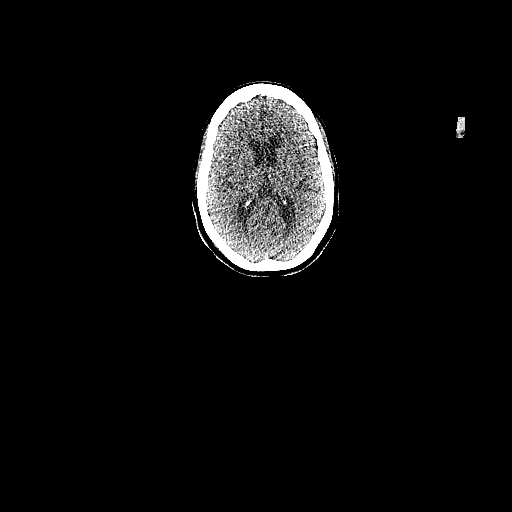

[Series 5: pet wb nac · axial · 4.0mm · 4.07mm/px · z∈[-980,-104]mm · 8 of 220 slices shown]
[im 1/220]
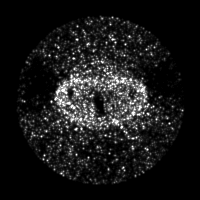
[im 32/220]
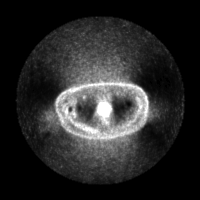
[im 63/220]
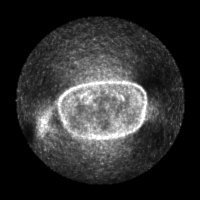
[im 94/220]
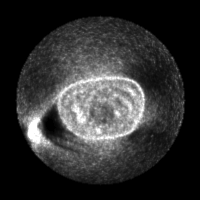
[im 126/220]
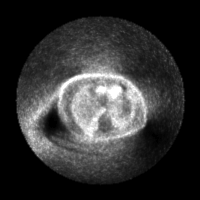
[im 157/220]
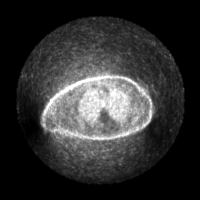
[im 188/220]
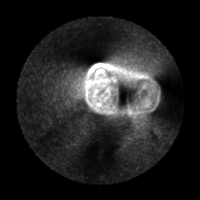
[im 220/220]
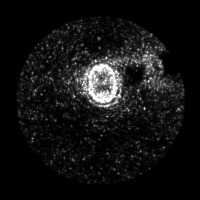

[25 of 25 positions shown; findings below may reference images not displayed]

FINDINGS: NECK/CHEST: There is diffuse low-grade activity throughout the right breast 
correlating to the treatment changes maximum SUV of 2.0. No additional abnormal 
activity seen within the neck or chest. 
ABDOMEN/PELVIS: No abnormal FDG uptake. 
MUSCULOSKELETAL: There is activity throughout the right subscapularis muscle 
which would be very unusual for metastatic disease.  There is also activity 
within the hips bilaterally corresponding to area of hardware. 
ADDITIONAL CT FUSION FINDINGS: Postop changes, atherosclerotic changes and 
degenerative changes.
IMPRESSION: Slightly unusual musculoskeletal activity with activity throughout the right 
subscapularis muscle as well as involving the hips bilaterally in this patient 
with bilateral postop changes in the hips. No suspicious focal lesion 
identified. Posttreatment changes right breast.

## 2022-07-17 IMAGING — MG MAMMOGRAPHY DIAGNOSTIC BILATERAL 3[PERSON_NAME]
8 series · 9 of 24 positions shown · non-contrast
Comparison: Comparison was made to prior examinations.

________________________________________________________________________________________________ 
MAMMOGRAPHY DIAGNOSTIC BILATERAL [id number] HAUKUR CRUZ MOTA, 07/17/2022 [DATE]: 
CLINICAL INDICATION: Previous history of right breast cancer in 7171 for 
follow-up.
TECHNIQUE: Digital bilateral mammograms and 3-D Tomosynthesis were obtained. 
These were interpreted both primarily and with the aid of computer-aided 
detection system.  
BREAST DENSITY: (Level C) The breasts are heterogeneously dense, which may 
obscure small masses.

[R MLO]
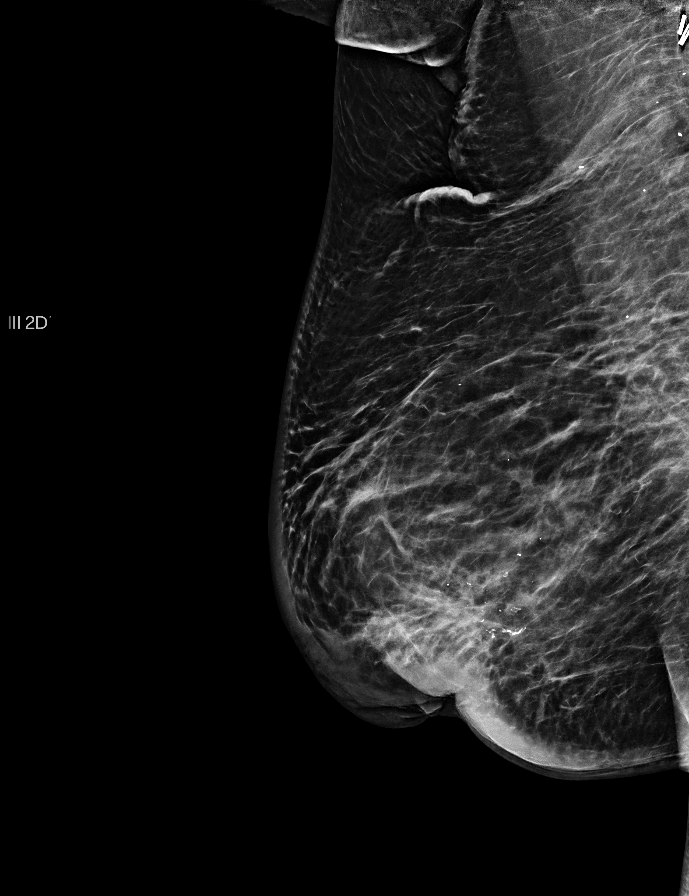

[L MLO]
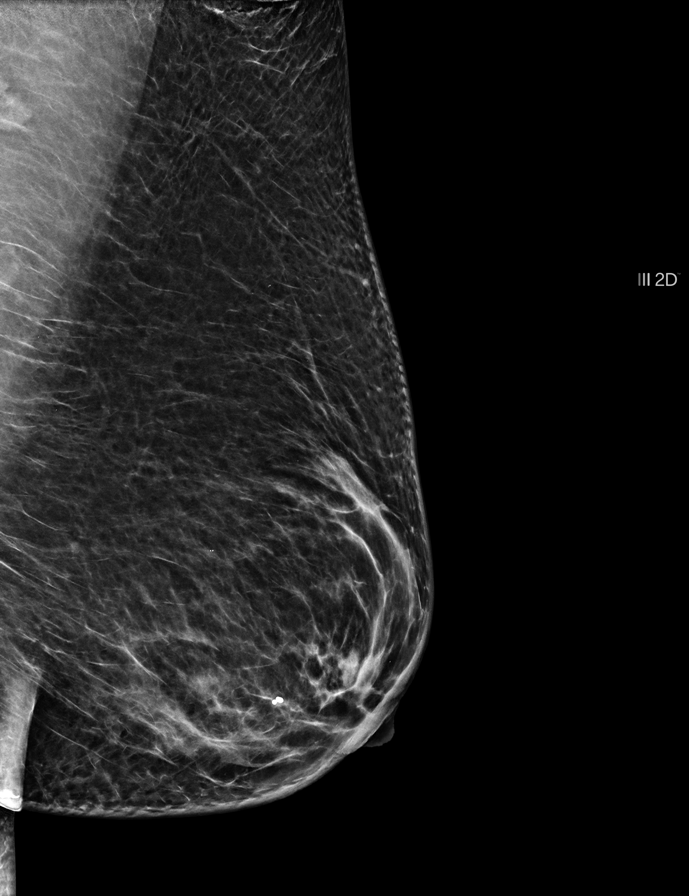

[R CC]
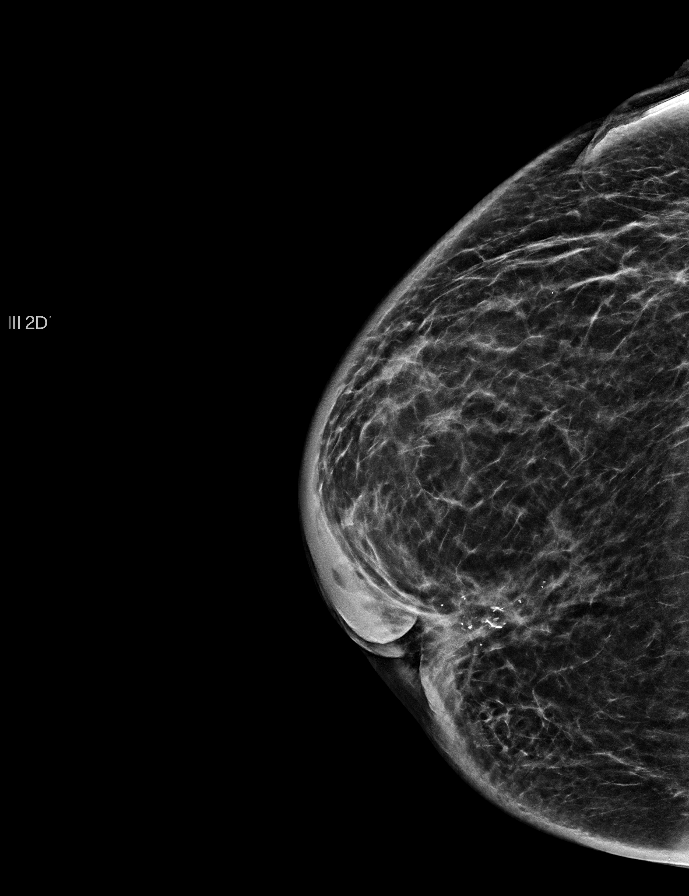

[L CC]
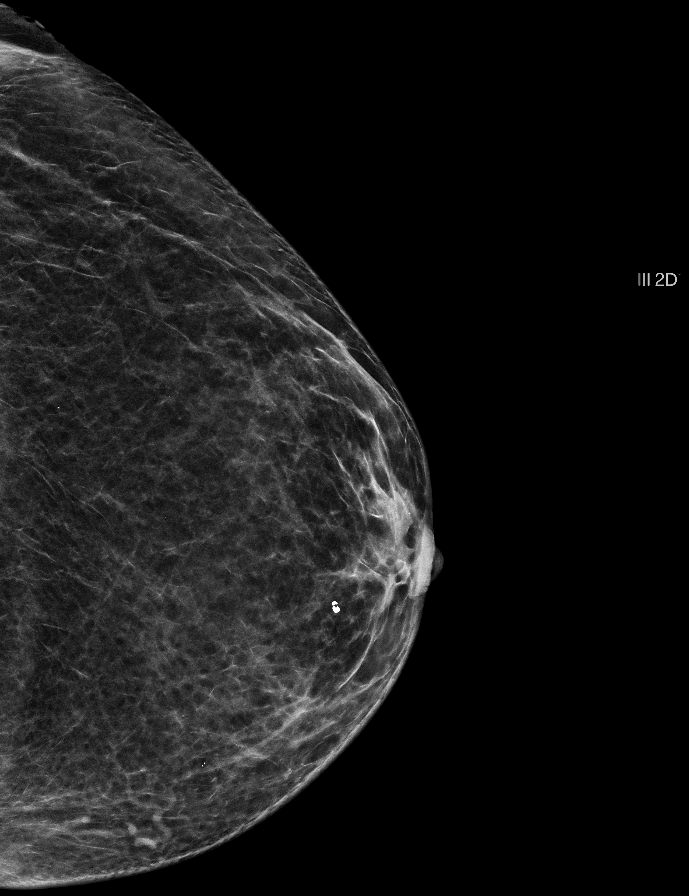

[R CC tomo · 2 of 65 frames shown]
[frame 21/65]
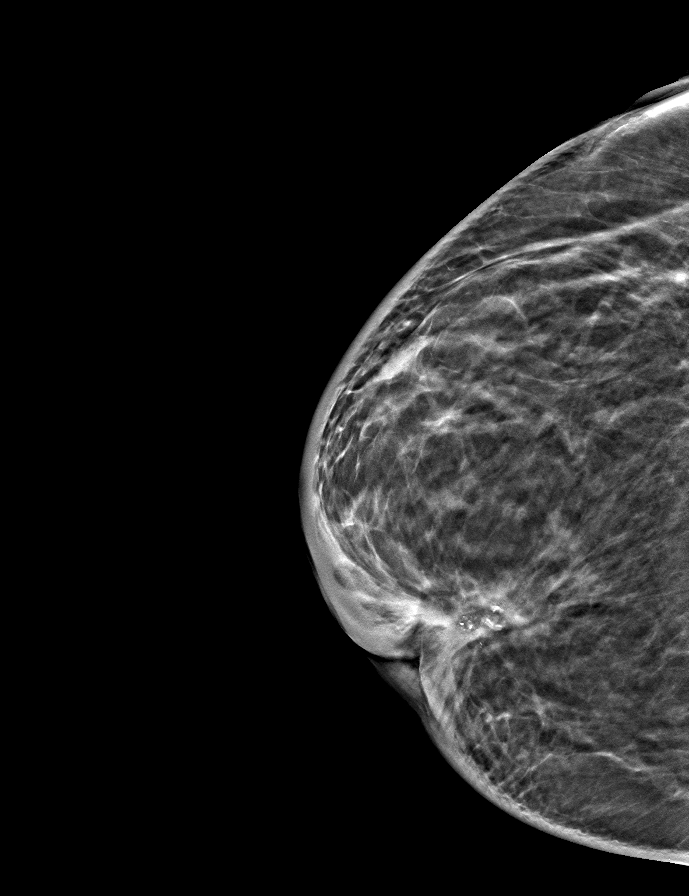
[frame 33/65]
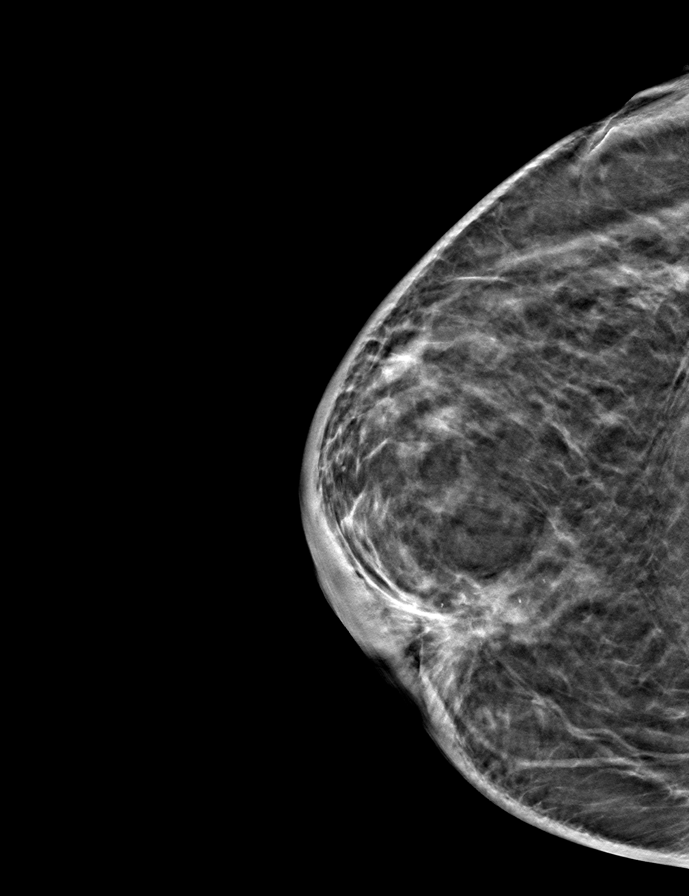

[R MLO tomo · tomo slice 38/75.0]
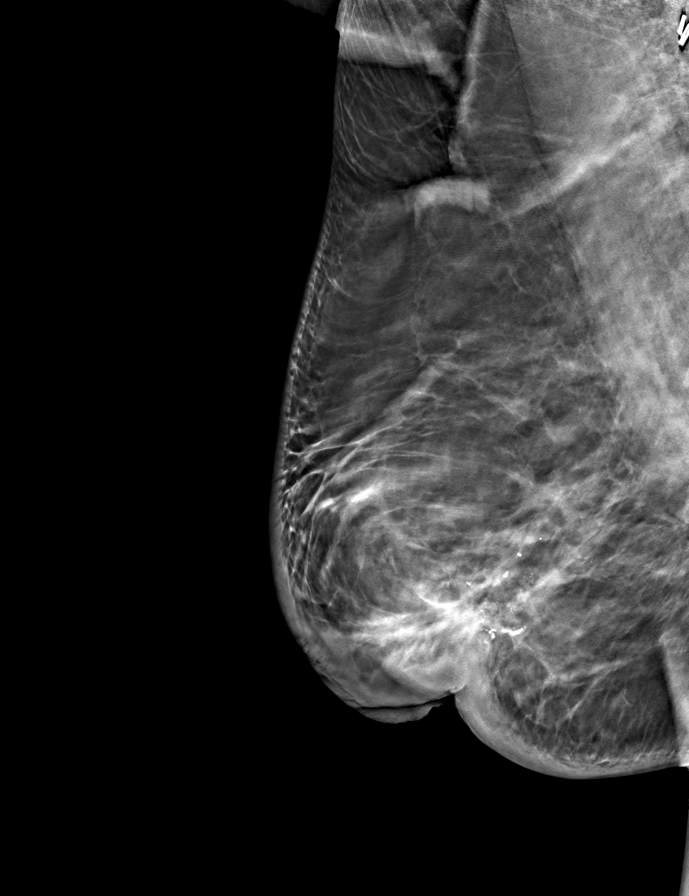

[L CC tomo · tomo slice 23/46.0]
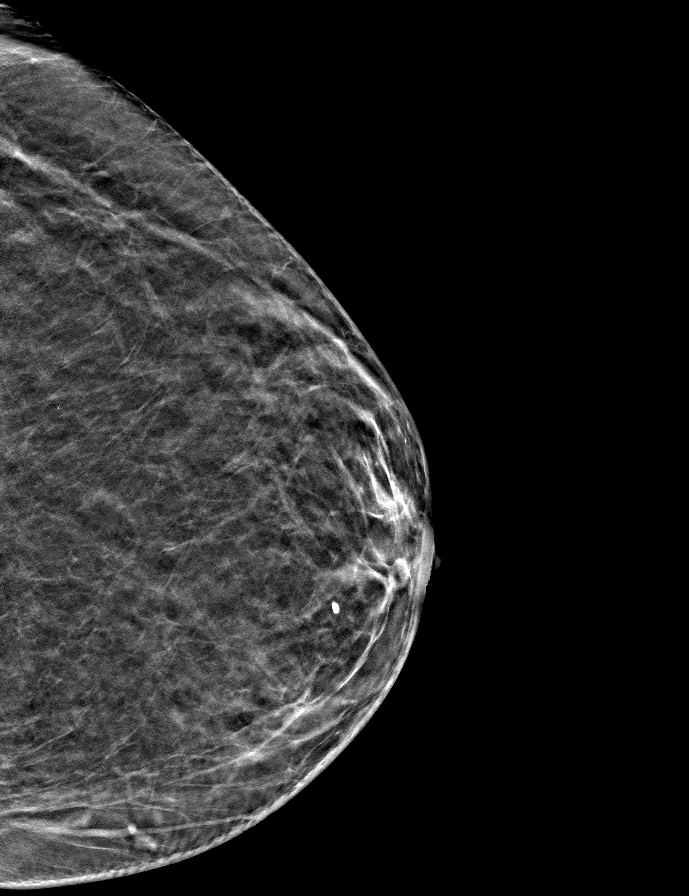

[L MLO tomo · tomo slice 29/57.0]
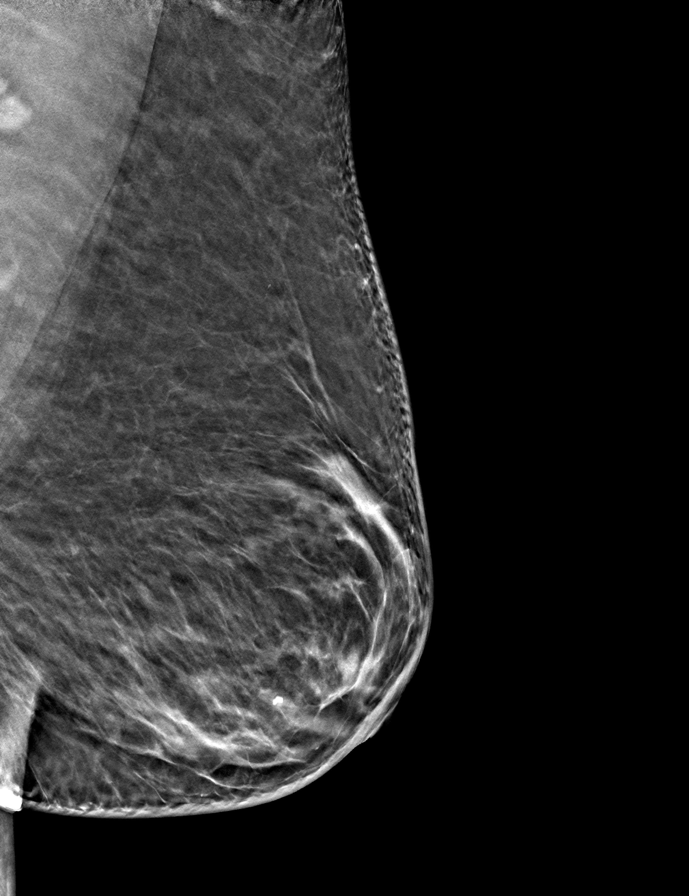

[9 of 24 positions shown; findings below may reference images not displayed]

FINDINGS: Stable scarring and dystrophic calcifications in the right breast.
IMPRESSION: (BI-RADS 2) Benign findings. Routine mammographic follow-up is recommended.
